# Patient Record
Sex: Female | Born: 1955 | Race: White | Hispanic: No | Marital: Married | Smoking: Never smoker
Health system: Southern US, Community
[De-identification: ages and names within clinical notes are randomized; demographics above are authoritative.]

## PROBLEM LIST (undated history)

## (undated) DIAGNOSIS — I82409 Acute embolism and thrombosis of unspecified deep veins of unspecified lower extremity: Secondary | ICD-10-CM

## (undated) DIAGNOSIS — Z227 Latent tuberculosis: Secondary | ICD-10-CM

## (undated) DIAGNOSIS — E119 Type 2 diabetes mellitus without complications: Secondary | ICD-10-CM

## (undated) DIAGNOSIS — E109 Type 1 diabetes mellitus without complications: Secondary | ICD-10-CM

## (undated) DIAGNOSIS — I10 Essential (primary) hypertension: Secondary | ICD-10-CM

## (undated) DIAGNOSIS — I509 Heart failure, unspecified: Secondary | ICD-10-CM

## (undated) DIAGNOSIS — I739 Peripheral vascular disease, unspecified: Secondary | ICD-10-CM

## (undated) DIAGNOSIS — I2510 Atherosclerotic heart disease of native coronary artery without angina pectoris: Secondary | ICD-10-CM

## (undated) DIAGNOSIS — R519 Headache, unspecified: Secondary | ICD-10-CM

## (undated) DIAGNOSIS — F32A Depression, unspecified: Secondary | ICD-10-CM

## (undated) DIAGNOSIS — M069 Rheumatoid arthritis, unspecified: Secondary | ICD-10-CM

## (undated) DIAGNOSIS — E785 Hyperlipidemia, unspecified: Secondary | ICD-10-CM

## (undated) DIAGNOSIS — Z9289 Personal history of other medical treatment: Secondary | ICD-10-CM

## (undated) DIAGNOSIS — F419 Anxiety disorder, unspecified: Secondary | ICD-10-CM

## (undated) HISTORY — PX: EYE SURGERY: SHX253

## (undated) HISTORY — PX: FOOT SURGERY: SHX648

## (undated) HISTORY — PX: TRIGGER FINGER RELEASE: SHX641

## (undated) HISTORY — PX: CARPAL TUNNEL RELEASE: SHX101

## (undated) HISTORY — PX: JOINT REPLACEMENT: SHX530

## (undated) NOTE — *Deleted (*Deleted)
Physical Medicine and Rehabilitation Admission H&P     HPI: Kaitlin Hudson is a 34 year old right-handed female history of RA with chronic prednisone, diastolic congestive heart failure, CAD followed at [facility] status post cardiac cath August 2021 underwent rotational atherectomy assisted multivessel PCI maintained on Plavix and Eliquis x12 months, CKD stage II with creatinine 1.37, hypertension, hyperlipidemia, diabetes mellitus with insulin pump and peripheral neuropathy.  Per chart review she lives with spouse.  1 level home with ramped entrance.  Modified independent with rolling walker prior to admission.  Presented 12/19/2019 with bilateral ischemic ulcers and lower extremity rest pain.  She is status post debridement and skin graft for right Achilles ulcer 4 months out from surgery.  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 Lajoyce Corners.  Wound vacs as directed.  Acute blood loss anemia 8.8.  Therapy evaluations completed and patient was admitted for a comprehensive rehab program.  Review of Systems  Constitutional: Negative for chills and fever.  HENT: Negative for hearing loss.   Eyes: Negative for blurred vision and double vision.  Respiratory: Negative for cough and shortness of breath.   Cardiovascular: Positive for palpitations and leg swelling.  Gastrointestinal: Positive for constipation. Negative for heartburn, nausea and vomiting.  Genitourinary: Negative for dysuria, flank pain and hematuria.  Musculoskeletal: Positive for joint pain and myalgias.  Neurological: Positive for headaches.  All other systems reviewed and are negative.  Past Medical History:  Diagnosis Date  . Arthritis, rheumatoid (HCC)   . CHF (congestive heart failure) (HCC)    pt denies this dx  . Coronary artery disease   . Diabetes mellitus without  complication (HCC)   . Headache   . Hyperlipidemia   . Hypertension   . Latent tuberculosis    pt denies this dx  . Peripheral vascular disease (HCC)    blood clot in left leg  . Type 1 diabetes [facility])    Past Surgical History:  Procedure Laterality Date  . AMPUTATION Bilateral 12/19/2019   Procedure: BILATERAL BELOW KNEE AMPUTATION;  Surgeon: Nadara Mustard, MD;  Location: [facility] OR;  Service: Orthopedics;  Laterality: Bilateral;  . CARDIAC CATHETERIZATION  11/02/2019  . CARPAL TUNNEL RELEASE Bilateral   . EYE SURGERY Bilateral    09/2018  . FINGER SURGERY  07/11/2014   right middle finger - nodule removed  . FINGER SURGERY  02/20/2017   right middle finger  . FOOT SURGERY Right 02/26/2014   screw  . I & D EXTREMITY Right 08/15/2019   Procedure: RIGHT ACHILLES DEBRIDEMENT, POSSIBLE SKIN GRAFT;  Surgeon: Nadara Mustard, MD;  Location: [facility] OR;  Service: Orthopedics;  Laterality: Right;  . JOINT REPLACEMENT    . SHOULDER SURGERY Right 01/06/2005  . TRIGGER FINGER RELEASE  12/202002   left hand -   . WRIST SURGERY Left 10/23/2004   and middle finger  . WRIST SURGERY Right 11/05/2005  . WRIST SURGERY Right 03/11/2006   cyst removed   History reviewed. No pertinent family history. Social History:  reports that she has never smoked. She has never used smokeless tobacco. She reports that she does not drink alcohol and does not use drugs. Allergies:  Allergies  Allergen Reactions  . Aspirin Other (See Comments)    Nose bleed  . Latex Rash  . Other Rash    VINYL   . Cimzia [Certolizumab Pegol] Other (See Comments)  bleeding  . Clindamycin/Lincomycin Diarrhea  . Diclofenac Nausea Only  . Enbrel [Etanercept] Hives  . Erythromycin Other (See Comments)    Severe stomach cramping  . Lasix [Furosemide] Itching  . Leflunomide Other (See Comments)    Fatigue/anxiety  . Rynatan [Chlorpheniramine-Phenyleph Er] Other (See Comments)    Stomach ache   . Statins Other (See Comments)     Myalgias.  . Tramadol Nausea And Vomiting  . Chlorhexidine Rash    CHG wipes  . Keflet [Cephalexin] Rash    Severe stomach cramping  . Penicillins Rash  . Pneumococcal Polysaccharide Vaccine Rash    All over body  . Sulfa Antibiotics Rash   Medications Prior to Admission  Medication Sig Dispense Refill  . acetaminophen (TYLENOL) 500 MG tablet Take 2 tablets (1,000 mg total) by mouth every 8 (eight) hours as needed for moderate pain. 30 tablet 0  . apixaban (ELIQUIS) 5 MG TABS tablet Take 5 mg by mouth 2 (two) times daily.    . bumetanide (BUMEX) 2 MG tablet Take 2 mg by mouth daily.    . cholecalciferol (VITAMIN D) 25 MCG (1000 UNIT) tablet Take 1,000 Units by mouth daily.    . clopidogrel (PLAVIX) 75 MG tablet Take 75 mg by mouth daily.    Marland Kitchen HUMALOG 100 UNIT/ML injection as directed. Via Insulin Pump    . metoprolol tartrate (LOPRESSOR) 25 MG tablet Take 12.5 mg by mouth 2 (two) times daily.    Marland Kitchen oxyCODONE (OXY IR/ROXICODONE) 5 MG immediate release tablet Take 1-2 tablets (5-10 mg total) by mouth every 4 (four) hours as needed for moderate pain (pain score 4-6). (Patient taking differently: Take 5-10 mg by mouth at bedtime. ) 30 tablet 0  . polyethylene glycol (MIRALAX / GLYCOLAX) 17 g packet Take 17 g by mouth daily as needed for moderate constipation.    . predniSONE (DELTASONE) 5 MG tablet Take 5-10 mg by mouth See admin instructions. Take 10 mg by mouth in the morning & take 5 mg by mouth at night.    . bisacodyl (DULCOLAX) 10 MG suppository Place 1 suppository (10 mg total) rectally daily as needed for moderate constipation. (Patient not taking: Reported on 12/13/2019) 12 suppository 0  . docusate sodium (COLACE) 100 MG capsule Take 1 capsule (100 mg total) by mouth 2 (two) times daily. 10 capsule 0  . glucose blood (ONETOUCH ULTRA) test strip Use 6 times daily. E11.65 on insulin pump    . Insulin Human (INSULIN PUMP) SOLN Inject into the skin as directed. With humalog insulin    .  Insulin Syringe-Needle U-100 (INSULIN SYRINGE .3CC/31GX5/16") 31G X 5/16" 0.3 ML MISC Use 1 time daily      Drug Regimen Review Drug regimen was reviewed and remains appropriate with no significant issues identified  Home: Home Living Family/patient expects to be discharged to:: Private residence Living Arrangements: Spouse/significant other Available Help at Discharge: Family, Available 24 hours/day Type of Home: House Home Access: Ramped entrance Home Layout: One level Bathroom Shower/Tub: Health visitor: Standard Bathroom Accessibility: Yes Home Equipment: Environmental consultant - 2 wheels, Bedside commode, Wheelchair - manual, Wheelchair - power, Tub bench Additional Comments: spouse very supportive and has been assisting with medical care    Functional History: Prior Function Level of Independence: Needs assistance Gait / Transfers Assistance Needed: mod I with RW  ADL's / Homemaking Assistance Needed: Spouse assisted with ADLs   Functional Status:  Mobility: Bed Mobility Overal bed mobility: Needs Assistance Bed Mobility: Supine to  Sit Supine to sit: Supervision General bed mobility comments: Supine to long sit with use of bed rail x 5 without physical assist Transfers General transfer comment: Pt unable to tolerate this date       ADL: ADL Overall ADL's : Needs assistance/impaired Eating/Feeding: Independent Grooming: Wash/dry hands, Wash/dry face, Oral care, Brushing hair, Set up, Bed level Upper Body Bathing: Set up, Bed level Lower Body Bathing: Moderate assistance, Bed level Upper Body Dressing : Moderate assistance, Bed level Lower Body Dressing: Maximal assistance, Bed level Toilet Transfer: Total assistance Toileting- Clothing Manipulation and Hygiene: Total assistance, Bed level General ADL Comments: Pt currently limited by pain   Cognition: Cognition Overall Cognitive Status: Within Functional Limits for tasks assessed Orientation Level: Oriented  X4 Cognition Arousal/Alertness: Awake/alert Behavior During Therapy: Anxious Overall Cognitive Status: Within Functional Limits for tasks assessed  Physical Exam: Blood pressure 138/60, pulse 91, temperature 98.2 F (36.8 C), temperature source Oral, resp. rate 15, height 5\' 4"  (1.626 m), weight 83.9 kg, SpO2 100 %. Physical Exam Skin:    Comments: Bilateral BKA sites are dressed with wound VAC in place  Neurological:     Comments: Patient is alert in no acute distress.  Oriented x3 and follows commands.     Results for orders placed or performed during the hospital encounter of 12/19/19 (from the past 48 hour(s))  Glucose, capillary     Status: Abnormal   Collection Time: 12/19/19  4:18 PM  Result Value Ref Range   Glucose-Capillary 130 (H) 70 - 99 mg/dL    Comment: Glucose reference range applies only to samples taken after fasting for at least 8 hours.  Hemoglobin A1c     Status: Abnormal   Collection Time: 12/19/19  5:09 PM  Result Value Ref Range   Hgb A1c MFr Bld 6.3 (H) 4.8 - 5.6 %    Comment: (NOTE) Pre diabetes:          5.7%-6.4%  Diabetes:              >6.4%  Glycemic control for   <7.0% adults with diabetes    Mean Plasma Glucose 134.11 mg/dL    Comment: Performed at [facility], [address], [place], [place] [address]  Glucose, capillary     Status: Abnormal   Collection Time: 12/19/19  8:04 PM  Result Value Ref Range   Glucose-Capillary 125 (H) 70 - 99 mg/dL    Comment: Glucose reference range applies only to samples taken after fasting for at least 8 hours.  Basic metabolic panel     Status: Abnormal   Collection Time: 12/20/19 12:19 AM  Result Value Ref Range   Sodium 137 135 - 145 mmol/L   Potassium 5.2 (H) 3.5 - 5.1 mmol/L   Chloride 106 98 - 111 mmol/L   CO2 21 (L) 22 - 32 mmol/L   Glucose, Bld 182 (H) 70 - 99 mg/dL    Comment: Glucose reference range applies only to samples taken after fasting for at least 8 hours.   BUN 28 (H) 8 - 23  mg/dL   Creatinine, Ser 6.04 (H) 0.44 - 1.00 mg/dL   Calcium 8.3 (L) 8.9 - 10.3 mg/dL   GFR calc non Af Amer 45 (L) >60 mL/min   Anion gap 10 5 - 15    Comment: Performed at [facility], [address], [place], [place] [address]  CBC     Status: Abnormal   Collection Time: 12/20/19 12:19 AM  Result Value Ref Range   WBC 13.1 (H) 4.0 - 10.5 K/uL   RBC 2.54 (L) 3.87 - 5.11 MIL/uL   Hemoglobin 7.2 (L) 12.0 - 15.0 g/dL    Comment: REPEATED TO VERIFY POST SURGICAL SPECIMEN    HCT 23.3 (L) 36 - 46 %   MCV 91.7 80.0 - 100.0 fL   MCH 28.3 26.0 - 34.0 pg   MCHC 30.9 30.0 - 36.0 g/dL   RDW 40.9 81.1 - 91.4 %   Platelets 303 150 - 400 K/uL   nRBC 0.0 0.0 - 0.2 %    Comment: Performed at [facility], [address], [place], [place] [address]  Glucose, capillary     Status: Abnormal   Collection Time: 12/20/19 12:50 AM  Result Value Ref Range   Glucose-Capillary 165 (H) 70 - 99 mg/dL    Comment: Glucose reference range applies only to samples taken after fasting for at least 8 hours.  Glucose, capillary     Status: Abnormal   Collection Time: 12/20/19  4:00 AM  Result Value Ref Range   Glucose-Capillary 162 (H) 70 - 99 mg/dL    Comment: Glucose reference range applies only to samples taken after fasting for at least 8 hours.  Glucose, capillary     Status: Abnormal   Collection Time: 12/20/19  7:50 AM  Result Value Ref Range   Glucose-Capillary 176 (H) 70 - 99 mg/dL    Comment: Glucose reference range applies only to samples taken after fasting for at least 8 hours.  ABO/Rh     Status: None   Collection Time: 12/20/19  8:19 AM  Result Value Ref Range   ABO/RH(D)      O POS Performed at [facility], [address], [place], [place] [address]   Glucose, capillary     Status: Abnormal   Collection Time: 12/20/19 11:39 AM  Result Value Ref Range   Glucose-Capillary 239 (H) 70 - 99 mg/dL    Comment: Glucose reference range applies only to samples taken after  fasting for at least 8 hours.  Type and screen [facility]     Status: None   Collection Time: 12/20/19 11:57 AM  Result Value Ref Range   ABO/RH(D) O POS    Antibody Screen NEG    Sample Expiration 12/23/2019,2359    Unit Number [id number]    Blood Component Type RED CELLS,LR    Unit division 00    Status of Unit ISSUED,FINAL    Transfusion Status OK TO TRANSFUSE    Crossmatch Result      Compatible Performed at [facility], [address], [place], [place] [address]   Prepare RBC (crossmatch)     Status: None   Collection Time: 12/20/19 11:57 AM  Result Value Ref Range   Order Confirmation      ORDER PROCESSED BY BLOOD BANK Performed at [facility], [address], [place], [place] [address]   Glucose, capillary     Status: Abnormal   Collection Time: 12/20/19  4:13 PM  Result Value Ref Range   Glucose-Capillary 253 (H) 70 - 99 mg/dL    Comment: Glucose reference range applies only to samples taken after fasting for at least 8 hours.  Glucose, capillary     Status: Abnormal   Collection Time: 12/20/19  8:23 PM  Result Value Ref Range   Glucose-Capillary 229 (H) 70 - 99 mg/dL    Comment: Glucose reference  range applies only to samples taken after fasting for at least 8 hours.  Hemoglobin and hematocrit, blood     Status: Abnormal   Collection Time: 12/20/19  9:13 PM  Result Value Ref Range   Hemoglobin 8.5 (L) 12.0 - 15.0 g/dL   HCT 38.7 (L) 36 - 46 %    Comment: Performed at [facility], [address], [place], [place] [address]  Glucose, capillary     Status: Abnormal   Collection Time: 12/21/19 12:15 AM  Result Value Ref Range   Glucose-Capillary 161 (H) 70 - 99 mg/dL    Comment: Glucose reference range applies only to samples taken after fasting for at least 8 hours.  CBC     Status: Abnormal   Collection Time: 12/21/19  2:36 AM  Result Value Ref Range   WBC 10.5 4.0 - 10.5 K/uL   RBC 3.10 (L) 3.87 - 5.11 MIL/uL    Hemoglobin 8.8 (L) 12.0 - 15.0 g/dL   HCT 29.5 (L) 36 - 46 %   MCV 89.0 80.0 - 100.0 fL   MCH 28.4 26.0 - 34.0 pg   MCHC 31.9 30.0 - 36.0 g/dL   RDW 18.8 41.6 - 60.6 %   Platelets 263 150 - 400 K/uL   nRBC 0.0 0.0 - 0.2 %    Comment: Performed at [facility], [address], [place], [place] [address]  Basic metabolic panel     Status: Abnormal   Collection Time: 12/21/19  2:36 AM  Result Value Ref Range   Sodium 138 135 - 145 mmol/L   Potassium 4.3 3.5 - 5.1 mmol/L   Chloride 105 98 - 111 mmol/L   CO2 24 22 - 32 mmol/L   Glucose, Bld 126 (H) 70 - 99 mg/dL    Comment: Glucose reference range applies only to samples taken after fasting for at least 8 hours.   BUN 21 8 - 23 mg/dL   Creatinine, Ser 1.09 (H) 0.44 - 1.00 mg/dL   Calcium 8.4 (L) 8.9 - 10.3 mg/dL   GFR calc non Af Amer 53 (L) >60 mL/min   Anion gap 9 5 - 15    Comment: Performed at [facility], [address] [place], [place] [address]  Glucose, capillary     Status: None   Collection Time: 12/21/19  4:14 AM  Result Value Ref Range   Glucose-Capillary 91 70 - 99 mg/dL    Comment: Glucose reference range applies only to samples taken after fasting for at least 8 hours.  Glucose, capillary     Status: Abnormal   Collection Time: 12/21/19  7:50 AM  Result Value Ref Range   Glucose-Capillary 126 (H) 70 - 99 mg/dL    Comment: Glucose reference range applies only to samples taken after fasting for at least 8 hours.  Glucose, capillary     Status: Abnormal   Collection Time: 12/21/19 12:35 PM  Result Value Ref Range   Glucose-Capillary 127 (H) 70 - 99 mg/dL    Comment: Glucose reference range applies only to samples taken after fasting for at least 8 hours.   No results found.     Medical Problem List and Plan: 1.  Decreased functional mobility secondary to progressive bilateral lower extremity gangrenous changes with recent debridement and skin grafts.  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  Wound VAC  as directed  -patient may *** shower  -ELOS/Goals: *** 2.  Antithrombotics: -DVT/anticoagulation: Eliquis  -antiplatelet therapy: Plavix  3. Pain Management: Neurontin 100 mg 3 times daily, oxycodone as needed 4. Mood: Provide emotional support  -antipsychotic agents: N/A 5. Neuropsych: This patient is capable of making decisions on her own behalf. 6. Skin/Wound Care: Routine skin checks 7. Fluids/Electrolytes/Nutrition: Routine in and outs with follow-up chemistries 8.  Acute blood loss anemia.  Follow-up CBC 9.  Rheumatoid arthritis.  Chronic prednisone 10.  CAD status post cardiac catheterization August 2021 underwent rotational atherectomy assisted multivessel PCI.  Continue Plavix and Eliquis x12 months 11.  CKD stage II.  Baseline creatinine 1.37 12.  Diastolic congestive heart failure.  Monitor for any signs of fluid overload.  Continue Bumex 2 mg daily 13.  Hypertension.  Lopressor 12.5 mg twice daily.  Monitor with increased mobility 14.  Diabetes mellitus with peripheral neuropathy.  Hemoglobin A1c 6.3.  Patient with insulin pump prior to admission.  Follow-up diabetic coordinator ***  Charlton Amor, PA-C 12/21/2019

---

## 2000-05-17 ENCOUNTER — Encounter: Admission: RE | Admit: 2000-05-17 | Discharge: 2000-08-15 | Payer: Self-pay | Admitting: Internal Medicine

## 2004-10-23 HISTORY — PX: WRIST SURGERY: SHX841

## 2004-11-30 ENCOUNTER — Encounter: Admission: RE | Admit: 2004-11-30 | Discharge: 2004-11-30 | Payer: Self-pay | Admitting: Rheumatology

## 2005-01-06 HISTORY — PX: SHOULDER SURGERY: SHX246

## 2005-11-05 HISTORY — PX: WRIST SURGERY: SHX841

## 2006-03-11 HISTORY — PX: WRIST SURGERY: SHX841

## 2010-05-28 ENCOUNTER — Ambulatory Visit (HOSPITAL_COMMUNITY)
Admission: RE | Admit: 2010-05-28 | Discharge: 2010-05-28 | Disposition: A | Discharge status: Home / Self Care | Payer: Managed Care, Other (non HMO) | Source: Ambulatory Visit | Attending: Orthopedic Surgery | Admitting: Orthopedic Surgery

## 2010-05-28 ENCOUNTER — Other Ambulatory Visit: Payer: Self-pay | Admitting: Orthopedic Surgery

## 2010-05-28 DIAGNOSIS — M79609 Pain in unspecified limb: Secondary | ICD-10-CM

## 2010-05-28 DIAGNOSIS — E119 Type 2 diabetes mellitus without complications: Secondary | ICD-10-CM | POA: Insufficient documentation

## 2010-05-28 DIAGNOSIS — I10 Essential (primary) hypertension: Secondary | ICD-10-CM | POA: Insufficient documentation

## 2010-06-21 ENCOUNTER — Emergency Department (HOSPITAL_BASED_OUTPATIENT_CLINIC_OR_DEPARTMENT_OTHER)
Admission: EM | Admit: 2010-06-21 | Discharge: 2010-06-21 | Disposition: A | Discharge status: Home / Self Care | Payer: Managed Care, Other (non HMO) | Attending: Emergency Medicine | Admitting: Emergency Medicine

## 2010-06-21 DIAGNOSIS — I10 Essential (primary) hypertension: Secondary | ICD-10-CM | POA: Insufficient documentation

## 2010-06-21 DIAGNOSIS — H9209 Otalgia, unspecified ear: Secondary | ICD-10-CM | POA: Insufficient documentation

## 2010-06-21 DIAGNOSIS — Z8739 Personal history of other diseases of the musculoskeletal system and connective tissue: Secondary | ICD-10-CM | POA: Insufficient documentation

## 2010-06-21 DIAGNOSIS — M26609 Unspecified temporomandibular joint disorder, unspecified side: Secondary | ICD-10-CM | POA: Insufficient documentation

## 2010-06-21 DIAGNOSIS — Z79899 Other long term (current) drug therapy: Secondary | ICD-10-CM | POA: Insufficient documentation

## 2010-06-21 DIAGNOSIS — E119 Type 2 diabetes mellitus without complications: Secondary | ICD-10-CM | POA: Insufficient documentation

## 2011-09-02 ENCOUNTER — Other Ambulatory Visit: Payer: Self-pay | Admitting: Orthopedic Surgery

## 2011-09-02 DIAGNOSIS — M25519 Pain in unspecified shoulder: Secondary | ICD-10-CM

## 2011-09-10 ENCOUNTER — Ambulatory Visit
Admission: RE | Admit: 2011-09-10 | Discharge: 2011-09-10 | Disposition: A | Discharge status: Home / Self Care | Payer: Managed Care, Other (non HMO) | Source: Ambulatory Visit | Attending: Orthopedic Surgery | Admitting: Orthopedic Surgery

## 2011-09-10 DIAGNOSIS — M25519 Pain in unspecified shoulder: Secondary | ICD-10-CM

## 2013-09-08 ENCOUNTER — Encounter (HOSPITAL_BASED_OUTPATIENT_CLINIC_OR_DEPARTMENT_OTHER): Payer: Self-pay | Admitting: Emergency Medicine

## 2013-09-08 ENCOUNTER — Inpatient Hospital Stay (HOSPITAL_BASED_OUTPATIENT_CLINIC_OR_DEPARTMENT_OTHER)
Admission: EM | Admit: 2013-09-08 | Discharge: 2013-09-12 | DRG: 603 | Disposition: A | Discharge status: Home / Self Care | Payer: Managed Care, Other (non HMO) | Attending: Internal Medicine | Admitting: Internal Medicine

## 2013-09-08 ENCOUNTER — Emergency Department (HOSPITAL_BASED_OUTPATIENT_CLINIC_OR_DEPARTMENT_OTHER): Payer: Managed Care, Other (non HMO)

## 2013-09-08 DIAGNOSIS — L03119 Cellulitis of unspecified part of limb: Principal | ICD-10-CM

## 2013-09-08 DIAGNOSIS — R509 Fever, unspecified: Secondary | ICD-10-CM

## 2013-09-08 DIAGNOSIS — Z9641 Presence of insulin pump (external) (internal): Secondary | ICD-10-CM

## 2013-09-08 DIAGNOSIS — E119 Type 2 diabetes mellitus without complications: Secondary | ICD-10-CM | POA: Diagnosis present

## 2013-09-08 DIAGNOSIS — I10 Essential (primary) hypertension: Secondary | ICD-10-CM | POA: Diagnosis present

## 2013-09-08 DIAGNOSIS — I509 Heart failure, unspecified: Secondary | ICD-10-CM | POA: Diagnosis present

## 2013-09-08 DIAGNOSIS — Z7982 Long term (current) use of aspirin: Secondary | ICD-10-CM

## 2013-09-08 DIAGNOSIS — E785 Hyperlipidemia, unspecified: Secondary | ICD-10-CM | POA: Diagnosis present

## 2013-09-08 DIAGNOSIS — M069 Rheumatoid arthritis, unspecified: Secondary | ICD-10-CM | POA: Diagnosis present

## 2013-09-08 DIAGNOSIS — IMO0001 Reserved for inherently not codable concepts without codable children: Secondary | ICD-10-CM

## 2013-09-08 DIAGNOSIS — L03115 Cellulitis of right lower limb: Secondary | ICD-10-CM | POA: Diagnosis present

## 2013-09-08 DIAGNOSIS — Z794 Long term (current) use of insulin: Secondary | ICD-10-CM

## 2013-09-08 DIAGNOSIS — L02419 Cutaneous abscess of limb, unspecified: Principal | ICD-10-CM | POA: Diagnosis present

## 2013-09-08 DIAGNOSIS — Z79899 Other long term (current) drug therapy: Secondary | ICD-10-CM

## 2013-09-08 DIAGNOSIS — I5032 Chronic diastolic (congestive) heart failure: Secondary | ICD-10-CM | POA: Diagnosis present

## 2013-09-08 HISTORY — DX: Type 2 diabetes mellitus without complications: E11.9

## 2013-09-08 HISTORY — DX: Rheumatoid arthritis, unspecified: M06.9

## 2013-09-08 HISTORY — DX: Latent tuberculosis: Z22.7

## 2013-09-08 HISTORY — DX: Essential (primary) hypertension: I10

## 2013-09-08 HISTORY — DX: Heart failure, unspecified: I50.9

## 2013-09-08 HISTORY — DX: Hyperlipidemia, unspecified: E78.5

## 2013-09-08 LAB — CBC WITH DIFFERENTIAL/PLATELET
BASOS ABS: 0 10*3/uL (ref 0.0–0.1)
BASOS PCT: 0 % (ref 0–1)
EOS ABS: 0.1 10*3/uL (ref 0.0–0.7)
EOS PCT: 1 % (ref 0–5)
HEMATOCRIT: 34.7 % — AB (ref 36.0–46.0)
HEMOGLOBIN: 11.5 g/dL — AB (ref 12.0–15.0)
LYMPHS ABS: 2.2 10*3/uL (ref 0.7–4.0)
LYMPHS PCT: 17 % (ref 12–46)
MCH: 28.8 pg (ref 26.0–34.0)
MCHC: 33.1 g/dL (ref 30.0–36.0)
MCV: 87 fL (ref 78.0–100.0)
MONO ABS: 1.6 10*3/uL — AB (ref 0.1–1.0)
MONOS PCT: 12 % (ref 3–12)
NEUTROS ABS: 9.3 10*3/uL — AB (ref 1.7–7.7)
NEUTROS PCT: 70 % (ref 43–77)
Platelets: 247 10*3/uL (ref 150–400)
RBC: 3.99 MIL/uL (ref 3.87–5.11)
RDW: 14 % (ref 11.5–15.5)
WBC: 13.2 10*3/uL — ABNORMAL HIGH (ref 4.0–10.5)

## 2013-09-08 LAB — BASIC METABOLIC PANEL
BUN: 25 mg/dL — ABNORMAL HIGH (ref 6–23)
CALCIUM: 9.5 mg/dL (ref 8.4–10.5)
CO2: 25 mEq/L (ref 19–32)
Chloride: 102 mEq/L (ref 96–112)
Creatinine, Ser: 1.1 mg/dL (ref 0.50–1.10)
GFR calc Af Amer: 62 mL/min — ABNORMAL LOW (ref >90)
GFR calc non Af Amer: 54 mL/min — ABNORMAL LOW (ref >90)
Glucose, Bld: 88 mg/dL (ref 70–99)
Potassium: 4.4 mEq/L (ref 3.7–5.3)
Sodium: 140 mEq/L (ref 137–147)

## 2013-09-08 MED ORDER — ACETAMINOPHEN 325 MG PO TABS
650.0000 mg | ORAL_TABLET | Freq: Once | ORAL | Status: AC
  Administered 2013-09-08: 650 mg via ORAL
  Filled 2013-09-08: qty 2

## 2013-09-08 MED ORDER — ONDANSETRON HCL 4 MG/2ML IJ SOLN
4.0000 mg | Freq: Once | INTRAMUSCULAR | Status: AC
  Administered 2013-09-08: 4 mg via INTRAVENOUS
  Filled 2013-09-08: qty 2

## 2013-09-08 MED ORDER — HYDROMORPHONE HCL PF 1 MG/ML IJ SOLN
0.5000 mg | Freq: Once | INTRAMUSCULAR | Status: AC
  Administered 2013-09-08: 0.5 mg via INTRAVENOUS
  Filled 2013-09-08: qty 1

## 2013-09-08 MED ORDER — CLINDAMYCIN PHOSPHATE 600 MG/50ML IV SOLN
600.0000 mg | Freq: Once | INTRAVENOUS | Status: AC
  Administered 2013-09-08: 600 mg via INTRAVENOUS
  Filled 2013-09-08: qty 50

## 2013-09-08 MED ORDER — SODIUM CHLORIDE 0.9 % IV SOLN
Freq: Once | INTRAVENOUS | Status: AC
  Administered 2013-09-08: 21:00:00 via INTRAVENOUS

## 2013-09-08 NOTE — ED Notes (Signed)
Alert, NAD, calm, interactive, resps e/u, speaking in clear complete sentences, VSS, BP low, HR 109.

## 2013-09-08 NOTE — ED Notes (Signed)
Attempted report to 5N 08.

## 2013-09-08 NOTE — ED Notes (Signed)
Pt c/o RT ankle and foot pain; linear shaped redness and scabbing noted to RT mid lower leg; pt does not recall injury

## 2013-09-08 NOTE — ED Provider Notes (Signed)
CSN: [id number]     Arrival date & time 09/08/13  1858 History   First MD Initiated Contact with Patient 09/08/13 1934     Chief Complaint  Patient presents with  . Leg Pain     (Consider location/radiation/quality/duration/timing/severity/associated sxs/prior Treatment) Patient is a 58 y.o. female presenting with leg pain. The history is provided by the patient and the spouse. No language interpreter was used.  Leg Pain Location:  Leg Leg location:  R leg Associated symptoms: no fever   Associated symptoms comment:  Right lower extremity swelling and redness since this morning. She has a scabbed area to lateral midshaft leg but is unaware of injury. She reports increasing pain in the left and being unable to bear weight today. No known fever. No history of blood clots.    Past Medical History  Diagnosis Date  . Diabetes mellitus without complication   . Hypertension   . CHF (congestive heart failure)   . Arthritis, rheumatoid   . Hyperlipidemia   . Latent tuberculosis    History reviewed. No pertinent past surgical history. History reviewed. No pertinent family history. History  Substance Use Topics  . Smoking status: Never Smoker   . Smokeless tobacco: Not on file  . Alcohol Use: No   OB History   Grav Para Term Preterm Abortions TAB SAB Ect Mult Living                 Review of Systems  Constitutional: Negative for fever.  Respiratory: Negative for shortness of breath.   Cardiovascular: Negative for chest pain.  Gastrointestinal: Positive for nausea. Negative for vomiting.  Genitourinary: Negative for dysuria.  Musculoskeletal: Positive for myalgias.  Skin: Positive for color change and wound.  Neurological: Negative for weakness.      Allergies  Cimzia; Diclofenac; Enbrel; Erythromycin; Keflet; Rynatan; Sulfa antibiotics; and Tramadol  Home Medications   Prior to Admission medications   Medication Sig Start Date End Date Taking? Authorizing Provider   aspirin EC 81 MG tablet Take 81 mg by mouth daily.   Yes Historical Provider, MD  Insulin Aspart (NOVOLOG Irion) Inject into the skin. PUMP   Yes Historical Provider, MD  leflunomide (ARAVA) 20 MG tablet Take 20 mg by mouth daily.   Yes Historical Provider, MD  olmesartan (BENICAR) 20 MG tablet Take 20 mg by mouth daily.   Yes Historical Provider, MD  pravastatin (PRAVACHOL) 20 MG tablet Take 20 mg by mouth daily.   Yes Historical Provider, MD  prednisoLONE 5 MG TABS tablet Take by mouth.   Yes Historical Provider, MD   BP 152/53  Pulse 116  Temp(Src) 98.2 F (36.8 C) (Oral)  Ht 5\' 4"  (1.626 m)  Wt 172 lb (78.019 kg)  BMI 29.51 kg/m2  SpO2 100% Physical Exam  Constitutional: She is oriented to person, place, and time. She appears well-developed and well-nourished. No distress.  HENT:  Head: Normocephalic.  Eyes: Conjunctivae are normal.  Neck: Normal range of motion.  Cardiovascular: Regular rhythm and intact distal pulses.  Tachycardia present.   No murmur heard. Pulmonary/Chest: Effort normal. She has no wheezes. She has no rales.  Abdominal: Soft. There is no tenderness.  Musculoskeletal: She exhibits tenderness.  Right leg red, warm to the touch. Redness extends from foot to lower right leg. There is a scabbed lesion lateral mid-shaft without drainage.   Neurological: She is alert and oriented to person, place, and time.  Skin: Skin is warm and dry.  Psychiatric: She has a  normal mood and affect.    ED Course  Procedures (including critical care time) Labs Review Labs Reviewed  CBC WITH DIFFERENTIAL - Abnormal; Notable for the following:    WBC 13.2 (*)    Hemoglobin 11.5 (*)    HCT 34.7 (*)    Neutro Abs 9.3 (*)    Monocytes Absolute 1.6 (*)    All other components within normal limits  BASIC METABOLIC PANEL - Abnormal; Notable for the following:    BUN 25 (*)    GFR calc non Af Amer 54 (*)    GFR calc Af Amer 62 (*)    All other components within normal limits   CULTURE, BLOOD (ROUTINE X 2)  CULTURE, BLOOD (ROUTINE X 2)    Imaging Review US Venous Img Lower Unilateral Right  09/08/2013   CLINICAL DATA:  Non painful cut to the [facility].  Leg pain.  EXAM: Right LOWER EXTREMITY VENOUS DOPPLER ULTRASOUND  TECHNIQUE: Gray-scale sonography with graded compression, as well as color Doppler and duplex ultrasound were performed to evaluate the lower extremity deep venous systems from the level of the common femoral vein and including the common femoral, femoral, profunda femoral, popliteal and calf veins including the posterior tibial, peroneal and gastrocnemius veins when visible. The superficial great saphenous vein was also interrogated. Spectral Doppler was utilized to evaluate flow at rest and with distal augmentation maneuvers in the common femoral, femoral and popliteal veins.  COMPARISON:  None.  FINDINGS: Common Femoral Vein: No evidence of thrombus. Normal compressibility, respiratory phasicity and response to augmentation.  Saphenofemoral Junction: No evidence of thrombus. Normal compressibility and flow on color Doppler imaging.  Profunda Femoral Vein: No evidence of thrombus. Normal compressibility and flow on color Doppler imaging.  Femoral Vein: No evidence of thrombus.  Popliteal Vein: No evidence of thrombus.  Calf Veins: No evidence of thrombus. Note that the peroneal vein was not visualized.  Superficial Great Saphenous Vein: No evidence of thrombus.  In the popliteal fossa there is an elongated hypoechoic fluid collection which is directed towards the posterior joint line, 1 x 1.4 x 0.6 cm.  IMPRESSION: 1. No evidence of deep venous thrombosis in the right lower extremity. 2. 1.4 cm fluid collection in the popliteal fossa, likely a Baker's cyst.   Electronically Signed   By: Tiburcio Pea M.D.   On: 09/08/2013 21:42     EKG Interpretation None      MDM   Final diagnoses:  None    1. LE cellulitis  Doppler negative of right LE. Mild  leukocytosis, tachycardia, significant pain in lower leg, with evidence of cellulitis in a patient with h/o diabetes. Will page for admission. Blood cultures pending, clindamycin running IV. Pain controlled.     Arnoldo Hooker, PA-C 09/08/13 2234

## 2013-09-08 NOTE — ED Notes (Signed)
Pt alert, NAD, calm, interactive, resps e/u, speaking in clear complete sentences, no dyspnea noted, (denies sob, nausea or dizziness), admits to some pain 4/10, fever noted, requesting snack, reports recent low CBGs at night. Husband at [facility]. Updated.

## 2013-09-09 DIAGNOSIS — L02419 Cutaneous abscess of limb, unspecified: Principal | ICD-10-CM

## 2013-09-09 DIAGNOSIS — L03119 Cellulitis of unspecified part of limb: Principal | ICD-10-CM

## 2013-09-09 DIAGNOSIS — E119 Type 2 diabetes mellitus without complications: Secondary | ICD-10-CM

## 2013-09-09 DIAGNOSIS — IMO0001 Reserved for inherently not codable concepts without codable children: Secondary | ICD-10-CM

## 2013-09-09 DIAGNOSIS — Z794 Long term (current) use of insulin: Secondary | ICD-10-CM

## 2013-09-09 LAB — GLUCOSE, CAPILLARY
GLUCOSE-CAPILLARY: 206 mg/dL — AB (ref 70–99)
Glucose-Capillary: 212 mg/dL — ABNORMAL HIGH (ref 70–99)
Glucose-Capillary: 188 mg/dL — ABNORMAL HIGH (ref 70–99)

## 2013-09-09 LAB — CBG MONITORING, ED: GLUCOSE-CAPILLARY: 129 mg/dL — AB (ref 70–99)

## 2013-09-09 MED ORDER — PREDNISOLONE 5 MG PO TABS
5.0000 mg | ORAL_TABLET | Freq: Every day | ORAL | Status: DC
  Filled 2013-09-09: qty 1

## 2013-09-09 MED ORDER — HYDROMORPHONE HCL PF 1 MG/ML IJ SOLN
0.5000 mg | Freq: Once | INTRAMUSCULAR | Status: AC
  Administered 2013-09-09: 0.5 mg via INTRAVENOUS
  Filled 2013-09-09: qty 1

## 2013-09-09 MED ORDER — SIMVASTATIN 10 MG PO TABS
10.0000 mg | ORAL_TABLET | Freq: Every day | ORAL | Status: DC
  Filled 2013-09-09 (×5): qty 1

## 2013-09-09 MED ORDER — ACETAMINOPHEN 325 MG PO TABS
650.0000 mg | ORAL_TABLET | Freq: Four times a day (QID) | ORAL | Status: DC | PRN
  Administered 2013-09-09: 650 mg via ORAL
  Filled 2013-09-09: qty 2

## 2013-09-09 MED ORDER — INSULIN PUMP
Freq: Three times a day (TID) | SUBCUTANEOUS | Status: DC
  Administered 2013-09-09: 4 via SUBCUTANEOUS
  Administered 2013-09-09: 10 via SUBCUTANEOUS
  Administered 2013-09-09: 12 via SUBCUTANEOUS
  Administered 2013-09-10 (×2): via SUBCUTANEOUS
  Administered 2013-09-11: 13 via SUBCUTANEOUS
  Administered 2013-09-11: 11 via SUBCUTANEOUS
  Administered 2013-09-11: 6 via SUBCUTANEOUS
  Administered 2013-09-12: 7 via SUBCUTANEOUS
  Administered 2013-09-12: 16 via SUBCUTANEOUS
  Administered 2013-09-12: 14 via SUBCUTANEOUS
  Filled 2013-09-09: qty 1

## 2013-09-09 MED ORDER — HYDROMORPHONE HCL PF 1 MG/ML IJ SOLN: 0.5000 mg | INTRAMUSCULAR | Status: DC | PRN

## 2013-09-09 MED ORDER — CLINDAMYCIN PHOSPHATE 600 MG/50ML IV SOLN
600.0000 mg | Freq: Three times a day (TID) | INTRAVENOUS | Status: DC
  Administered 2013-09-09 – 2013-09-10 (×4): 600 mg via INTRAVENOUS
  Filled 2013-09-09 (×6): qty 50

## 2013-09-09 MED ORDER — PREDNISOLONE 5 MG PO TABS
10.0000 mg | ORAL_TABLET | Freq: Every day | ORAL | Status: DC
  Administered 2013-09-10 – 2013-09-12 (×3): 10 mg via ORAL
  Filled 2013-09-09 (×4): qty 2

## 2013-09-09 MED ORDER — HEPARIN SODIUM (PORCINE) 5000 UNIT/ML IJ SOLN
5000.0000 [IU] | Freq: Three times a day (TID) | INTRAMUSCULAR | Status: DC
  Administered 2013-09-09 – 2013-09-12 (×10): 5000 [IU] via SUBCUTANEOUS
  Filled 2013-09-09 (×13): qty 1

## 2013-09-09 MED ORDER — ASPIRIN EC 81 MG PO TBEC
81.0000 mg | DELAYED_RELEASE_TABLET | Freq: Every day | ORAL | Status: DC
  Administered 2013-09-09 – 2013-09-12 (×4): 81 mg via ORAL
  Filled 2013-09-09 (×4): qty 1

## 2013-09-09 MED ORDER — HYDROMORPHONE HCL PF 1 MG/ML IJ SOLN
0.5000 mg | Freq: Once | INTRAMUSCULAR | Status: AC
  Administered 2013-09-09: 0.5 mg via INTRAVENOUS

## 2013-09-09 MED ORDER — OLMESARTAN MEDOXOMIL 40 MG PO TABS
40.0000 mg | ORAL_TABLET | Freq: Every day | ORAL | Status: DC
  Administered 2013-09-10 – 2013-09-11 (×2): 40 mg via ORAL
  Filled 2013-09-09 (×4): qty 1

## 2013-09-09 MED ORDER — HYDROMORPHONE HCL PF 1 MG/ML IJ SOLN
INTRAMUSCULAR | Status: AC
  Administered 2013-09-09: 0.5 mg
  Filled 2013-09-09: qty 1

## 2013-09-09 MED ORDER — HYDROCODONE-ACETAMINOPHEN 5-325 MG PO TABS: 1.0000 | ORAL_TABLET | ORAL | Status: DC | PRN

## 2013-09-09 MED ORDER — LEFLUNOMIDE 20 MG PO TABS
20.0000 mg | ORAL_TABLET | Freq: Every day | ORAL | Status: DC
Start: 2013-09-09 — End: 2013-09-12
  Filled 2013-09-09 (×4): qty 1

## 2013-09-09 MED ORDER — IRBESARTAN 150 MG PO TABS
150.0000 mg | ORAL_TABLET | Freq: Every day | ORAL | Status: DC
  Filled 2013-09-09: qty 1

## 2013-09-09 MED ORDER — HYDROMORPHONE HCL 2 MG PO TABS
2.0000 mg | ORAL_TABLET | ORAL | Status: DC | PRN
  Administered 2013-09-09: 4 mg via ORAL
  Administered 2013-09-09: 2 mg via ORAL
  Administered 2013-09-11: 4 mg via ORAL
  Filled 2013-09-09 (×3): qty 2

## 2013-09-09 NOTE — H&P (Signed)
[facility] History and Physical  Kaitlin Hudson UMP:[id number] DOB: 12/09/1955 DOA: 09/08/2013  Referring physician: EDP PCP: [name]   Chief Complaint: Leg pain   HPI: Kaitlin Hudson is a 58 y.o. female who presents to the ED with RLE pain.  RLE pain, swelling, and redness since this morning.  Unable to bear weight today.  Has scabbed area to lateral midshaft leg but unaware of injury.  No h/o blood clots.  Given clindamycin in ED and transferred to [facility] for admission.  Review of Systems: Systems reviewed.  As above, otherwise negative  Past Medical History  Diagnosis Date  . Diabetes mellitus without complication   . Hypertension   . CHF (congestive heart failure)   . Arthritis, rheumatoid   . Hyperlipidemia   . Latent tuberculosis    History reviewed. No pertinent past surgical history. Social History:  reports that she has never smoked. She does not have any smokeless tobacco history on file. She reports that she does not drink alcohol or use illicit drugs.  Allergies  Allergen Reactions  . Cimzia [Certolizumab Pegol]   . Diclofenac   . Enbrel [Etanercept]   . Erythromycin   . Keflet [Cephalexin]   . Rynatan [Chlorpheniramine-Phenyleph Er]   . Sulfa Antibiotics   . Tramadol     History reviewed. No pertinent family history.   Prior to Admission medications   Medication Sig Start Date End Date Taking? Authorizing Provider  aspirin EC 81 MG tablet Take 81 mg by mouth daily.   Yes Historical Provider, MD  Insulin Aspart (NOVOLOG [place]) Inject into the skin. PUMP   Yes Historical Provider, MD  leflunomide (ARAVA) 20 MG tablet Take 20 mg by mouth daily.   Yes Historical Provider, MD  olmesartan (BENICAR) 20 MG tablet Take 20 mg by mouth daily.   Yes Historical Provider, MD  pravastatin (PRAVACHOL) 20 MG tablet Take 20 mg by mouth daily.   Yes Historical Provider, MD  prednisoLONE 5 MG TABS tablet Take by mouth.   Yes Historical Provider, MD   Physical  Exam: Filed Vitals:   09/09/13 0209  BP: 120/39  Pulse: 97  Temp: 98.6 F (37 C)  Resp: 20    BP 120/39  Pulse 97  Temp(Src) 98.6 F (37 C) (Oral)  Resp 20  Ht 5\' 4"  (1.626 m)  Wt 78.019 kg (172 lb)  BMI 29.51 kg/m2  SpO2 94%  General Appearance:    Alert, oriented, no distress, appears stated age  Head:    Normocephalic, atraumatic  Eyes:    PERRL, EOMI, sclera non-icteric        Nose:   Nares without drainage or epistaxis. Mucosa, turbinates normal  Throat:   Moist mucous membranes. Oropharynx without erythema or exudate.  Neck:   Supple. No carotid bruits.  No thyromegaly.  No lymphadenopathy.   Back:     No CVA tenderness, no spinal tenderness  Lungs:     Clear to auscultation bilaterally, without wheezes, rhonchi or rales  Chest wall:    No tenderness to palpitation  Heart:    Regular rate and rhythm without murmurs, gallops, rubs  Abdomen:     Soft, non-tender, nondistended, normal bowel sounds, no organomegaly  Genitalia:    deferred  Rectal:    deferred  Extremities:   No clubbing, cyanosis or edema.  Pulses:   2+ and symmetric all extremities  Skin:   There is a scabbed area to lateral midshaft of the R leg, the leg  does seem slightly swollen compared to the left.  I do not appreciate much in the way of erythema at this time.  Lymph nodes:   Cervical, supraclavicular, and axillary nodes normal  Neurologic:   CNII-XII intact. Normal strength, sensation and reflexes      throughout    Labs on Admission:  Basic Metabolic Panel:  Recent Labs Lab 09/08/13 2030  NA 140  K 4.4  CL 102  CO2 25  GLUCOSE 88  BUN 25*  CREATININE 1.10  CALCIUM 9.5   Liver Function Tests: No results found for this basename: AST, ALT, ALKPHOS, BILITOT, PROT, ALBUMIN,  in the last 168 hours No results found for this basename: LIPASE, AMYLASE,  in the last 168 hours No results found for this basename: AMMONIA,  in the last 168 hours CBC:  Recent Labs Lab 09/08/13 2030  WBC  13.2*  NEUTROABS 9.3*  HGB 11.5*  HCT 34.7*  MCV 87.0  PLT 247   Cardiac Enzymes: No results found for this basename: CKTOTAL, CKMB, CKMBINDEX, TROPONINI,  in the last 168 hours  BNP (last 3 results) No results found for this basename: PROBNP,  in the last 8760 hours CBG:  Recent Labs Lab 09/09/13 0027 09/09/13 0210  GLUCAP 129* 206*    Radiological Exams on Admission: US Venous Img Lower Unilateral Right  09/08/2013   CLINICAL DATA:  Non painful cut to the [facility].  Leg pain.  EXAM: Right LOWER EXTREMITY VENOUS DOPPLER ULTRASOUND  TECHNIQUE: Gray-scale sonography with graded compression, as well as color Doppler and duplex ultrasound were performed to evaluate the lower extremity deep venous systems from the level of the common femoral vein and including the common femoral, femoral, profunda femoral, popliteal and calf veins including the posterior tibial, peroneal and gastrocnemius veins when visible. The superficial great saphenous vein was also interrogated. Spectral Doppler was utilized to evaluate flow at rest and with distal augmentation maneuvers in the common femoral, femoral and popliteal veins.  COMPARISON:  None.  FINDINGS: Common Femoral Vein: No evidence of thrombus. Normal compressibility, respiratory phasicity and response to augmentation.  Saphenofemoral Junction: No evidence of thrombus. Normal compressibility and flow on color Doppler imaging.  Profunda Femoral Vein: No evidence of thrombus. Normal compressibility and flow on color Doppler imaging.  Femoral Vein: No evidence of thrombus.  Popliteal Vein: No evidence of thrombus.  Calf Veins: No evidence of thrombus. Note that the peroneal vein was not visualized.  Superficial Great Saphenous Vein: No evidence of thrombus.  In the popliteal fossa there is an elongated hypoechoic fluid collection which is directed towards the posterior joint line, 1 x 1.4 x 0.6 cm.  IMPRESSION: 1. No evidence of deep venous thrombosis in the  right lower extremity. 2. 1.4 cm fluid collection in the popliteal fossa, likely a Baker's cyst.   Electronically Signed   By: Tiburcio Pea M.D.   On: 09/08/2013 21:42    EKG: Independently reviewed.  Assessment/Plan Principal Problem:   Cellulitis of right lower extremity Active Problems:   IDDM (insulin dependent diabetes mellitus)   1. Cellulitis of RLE - physical exam is quite unimpressive, did have fever of 101.7 in ED and blood cx are pending.  Continuing clindamycin at this time. 2. IDDM - will continue patients insulin pump per home settings, on insulin pump protocol.    Code Status: Full Code  Family Communication: No family in room Disposition Plan: Admit to inpatient   Time spent: 50 min  Clelia Trabucco M. [facility] Pager [phone]  If 7AM-7PM, please contact the day team taking care of the patient [email or web address] Password [email or web address] 09/09/2013, 2:29 AM

## 2013-09-09 NOTE — ED Notes (Signed)
Given snack: 2% milk, apple sauce, cheese bar.

## 2013-09-09 NOTE — Progress Notes (Signed)
[facility] PROGRESS NOTE  Kaitlin Hudson JSE:[id number] DOB: 03-Jan-1956 DOA: 09/08/2013 PCP: [name] I have seen and examined pt who is a 57yo admitted this am by Dr Julian Reil, with RLE pain and swelling, redness s/p injuring mid leg area laterally>>unaware of how it happened.Will continue current abx management plan as per Dr Julian Reil, and follow cultures. She has increased ankle tenderness on exam, will obtain MRI and follow. Husband at bedside.      Kela Millin  [facility] Pager [phone]. If 7PM-7AM, please contact night-coverage at [email or web address], password [facility] 09/09/2013, 1:54 PM  LOS: 1 day

## 2013-09-09 NOTE — ED Notes (Signed)
Alert, NAD, calm, interactive, tolerating snacks, pending [facility] arrival, no changes, VSS. Denies nausea.

## 2013-09-09 NOTE — ED Notes (Signed)
[facility] here for transport. No change.

## 2013-09-09 NOTE — ED Notes (Signed)
CBG 129 

## 2013-09-09 NOTE — ED Notes (Signed)
Pending arrival of [facility]

## 2013-09-09 NOTE — ED Provider Notes (Signed)
Medical screening examination/treatment/procedure(s) were performed by non-physician practitioner and as supervising physician I was immediately available for consultation/collaboration.   EKG Interpretation None        Rolan Bucco, MD 09/09/13 0104

## 2013-09-10 ENCOUNTER — Inpatient Hospital Stay (HOSPITAL_COMMUNITY): Payer: Managed Care, Other (non HMO)

## 2013-09-10 LAB — GLUCOSE, CAPILLARY
GLUCOSE-CAPILLARY: 158 mg/dL — AB (ref 70–99)
GLUCOSE-CAPILLARY: 64 mg/dL — AB (ref 70–99)
GLUCOSE-CAPILLARY: 65 mg/dL — AB (ref 70–99)
GLUCOSE-CAPILLARY: 89 mg/dL (ref 70–99)
Glucose-Capillary: 144 mg/dL — ABNORMAL HIGH (ref 70–99)
Glucose-Capillary: 164 mg/dL — ABNORMAL HIGH (ref 70–99)
Glucose-Capillary: 142 mg/dL — ABNORMAL HIGH (ref 70–99)
Glucose-Capillary: 187 mg/dL — ABNORMAL HIGH (ref 70–99)

## 2013-09-10 MED ORDER — GADOBENATE DIMEGLUMINE 529 MG/ML IV SOLN
17.0000 mL | Freq: Once | INTRAVENOUS | Status: AC | PRN
  Administered 2013-09-10: 17 mL via INTRAVENOUS

## 2013-09-10 MED ORDER — PIPERACILLIN-TAZOBACTAM 3.375 G IVPB
3.3750 g | Freq: Three times a day (TID) | INTRAVENOUS | Status: DC
  Administered 2013-09-10 – 2013-09-12 (×6): 3.375 g via INTRAVENOUS
  Filled 2013-09-10 (×10): qty 50

## 2013-09-10 MED ORDER — SODIUM CHLORIDE 0.9 % IV SOLN
1250.0000 mg | INTRAVENOUS | Status: DC
  Administered 2013-09-10 – 2013-09-12 (×3): 1250 mg via INTRAVENOUS
  Filled 2013-09-10 (×3): qty 1250

## 2013-09-10 NOTE — Progress Notes (Signed)
[facility] PROGRESS NOTE  Kaitlin Hudson GYF:[id number] DOB: 1955-12-06 DOA: 09/08/2013 PCP: [name]  Assessment/Plan: Right lower extremity cellulitis -Patient still with fevers overnight, blood cultures still pending -Will change antibiotics to vancomycin and Zosyn -Await MRI of right foot, follow and further manage accordingly pending studies -Continue pain management Fevers - possibly secondary to above, but recurring on clindamycin -Follow cultures, antibiotics changed to vancomycin and Zosyn as above -Await MRI Diabetes mellitus -Continue insulin pump Code Status: Full Family Communication: Husband at bedside Disposition Plan: To home when medically ready   Consultants:  None  Procedures:  None  Antibiotics:  Clindamycin 6/28> 6/28  Vancomycin and Zosyn started 6/29  HPI/Subjective: State pain better controlled, feverish overnight, and still with right foot swelling  Objective: Filed Vitals:   09/10/13 0900  BP: 131/45  Pulse: 103  Temp: 100.5 F (38.1 C)  Resp: 18    Intake/Output Summary (Last 24 hours) at 09/10/13 1133 Last data filed at 09/10/13 0608  Gross per 24 hour  Intake    240 ml  Output      0 ml  Net    240 ml   Filed Weights   09/08/13 1909  Weight: 78.019 kg (172 lb)    Exam:  General: alert & oriented x 3 In NAD Cardiovascular: RRR, nl S1 s2 Respiratory: CTAB Abdomen: soft +BS NT/ND, no masses palpable Extremities: Right lower extremity-+2 swelling laterally on the dorsum of foot and ankle area. Decreased erythema but still some persisting around the ankle laterally. Increased tenderness present. No cyanosis.    Data Reviewed: Basic Metabolic Panel:  Recent Labs Lab 09/08/13 2030  NA 140  K 4.4  CL 102  CO2 25  GLUCOSE 88  BUN 25*  CREATININE 1.10  CALCIUM 9.5   Liver Function Tests: No results found for this basename: AST, ALT, ALKPHOS, BILITOT, PROT, ALBUMIN,  in the last 168 hours No results  found for this basename: LIPASE, AMYLASE,  in the last 168 hours No results found for this basename: AMMONIA,  in the last 168 hours CBC:  Recent Labs Lab 09/08/13 2030  WBC 13.2*  NEUTROABS 9.3*  HGB 11.5*  HCT 34.7*  MCV 87.0  PLT 247   Cardiac Enzymes: No results found for this basename: CKTOTAL, CKMB, CKMBINDEX, TROPONINI,  in the last 168 hours BNP (last 3 results) No results found for this basename: PROBNP,  in the last 8760 hours CBG:  Recent Labs Lab 09/09/13 1706 09/09/13 2211 09/10/13 0235 09/10/13 0640 09/10/13 1113  GLUCAP 144* 164* 142* 158* 187*    Recent Results (from the past 240 hour(s))  CULTURE, BLOOD (ROUTINE X 2)     Status: None   Collection Time    09/08/13 10:00 PM      Result Value Ref Range Status   Specimen Description BLOOD LEFT ANTECUBITAL   Final   Special Requests     Final   Value: BOTTLES DRAWN AEROBIC AND ANAEROBIC AER 1cc ANA 1 CC   Culture  Setup Time     Final   Value: 09/09/2013 02:21     Performed at [facility]   Culture     Final   Value:        BLOOD CULTURE RECEIVED NO GROWTH TO DATE CULTURE WILL BE HELD FOR 5 DAYS BEFORE ISSUING A FINAL NEGATIVE REPORT     Performed at [facility]   Report Status PENDING   Incomplete  CULTURE, BLOOD (ROUTINE X 2)  Status: None   Collection Time    09/08/13 10:01 PM      Result Value Ref Range Status   Specimen Description BLOOD LEFT HAND   Final   Special Requests     Final   Value: BOTTLES DRAWN AEROBIC AND ANAEROBIC AER 5cc ANA 5cc   Culture  Setup Time     Final   Value: 09/09/2013 02:21     Performed at [facility]   Culture     Final   Value:        BLOOD CULTURE RECEIVED NO GROWTH TO DATE CULTURE WILL BE HELD FOR 5 DAYS BEFORE ISSUING A FINAL NEGATIVE REPORT     Performed at [facility]   Report Status PENDING   Incomplete     Studies: US Venous Img Lower Unilateral Right  09/08/2013   CLINICAL DATA:  Non painful cut to the [facility].   Leg pain.  EXAM: Right LOWER EXTREMITY VENOUS DOPPLER ULTRASOUND  TECHNIQUE: Gray-scale sonography with graded compression, as well as color Doppler and duplex ultrasound were performed to evaluate the lower extremity deep venous systems from the level of the common femoral vein and including the common femoral, femoral, profunda femoral, popliteal and calf veins including the posterior tibial, peroneal and gastrocnemius veins when visible. The superficial great saphenous vein was also interrogated. Spectral Doppler was utilized to evaluate flow at rest and with distal augmentation maneuvers in the common femoral, femoral and popliteal veins.  COMPARISON:  None.  FINDINGS: Common Femoral Vein: No evidence of thrombus. Normal compressibility, respiratory phasicity and response to augmentation.  Saphenofemoral Junction: No evidence of thrombus. Normal compressibility and flow on color Doppler imaging.  Profunda Femoral Vein: No evidence of thrombus. Normal compressibility and flow on color Doppler imaging.  Femoral Vein: No evidence of thrombus.  Popliteal Vein: No evidence of thrombus.  Calf Veins: No evidence of thrombus. Note that the peroneal vein was not visualized.  Superficial Great Saphenous Vein: No evidence of thrombus.  In the popliteal fossa there is an elongated hypoechoic fluid collection which is directed towards the posterior joint line, 1 x 1.4 x 0.6 cm.  IMPRESSION: 1. No evidence of deep venous thrombosis in the right lower extremity. 2. 1.4 cm fluid collection in the popliteal fossa, likely a Baker's cyst.   Electronically Signed   By: Tiburcio Pea M.D.   On: 09/08/2013 21:42    Scheduled Meds: . aspirin EC  81 mg Oral Daily  . heparin  5,000 Units Subcutaneous 3 times per day  . insulin pump   Subcutaneous TID AC, HS, 0200  . leflunomide  20 mg Oral Daily  . olmesartan  40 mg Oral Daily  . piperacillin-tazobactam (ZOSYN)  IV  3.375 g Intravenous 3 times per day  . prednisoLONE  10 mg  Oral Daily  . simvastatin  10 mg Oral q1800  . vancomycin  1,250 mg Intravenous Q24H   Continuous Infusions:   Principal Problem:   Cellulitis of right lower extremity Active Problems:   IDDM (insulin dependent diabetes mellitus)    Time spent: 35    [facility] C  [facility] Pager [phone]. If 7PM-7AM, please contact night-coverage at [email or web address], password [facility] 09/10/2013, 11:33 AM  LOS: 2 days

## 2013-09-10 NOTE — Progress Notes (Signed)
ANTIBIOTIC CONSULT NOTE - INITIAL  Pharmacy Consult for Vanc/Zosyn Indication: cellulitis  Allergies  Allergen Reactions  . Cimzia [Certolizumab Pegol]     bleeding  . Clindamycin/Lincomycin Diarrhea  . Diclofenac Nausea Only  . Enbrel [Etanercept] Hives  . Erythromycin     Severe stomach cramping  . Rynatan [Chlorpheniramine-Phenyleph Er]     unsure  . Tramadol Nausea And Vomiting  . Keflet [Cephalexin] Rash    Severe stomach cramping  . Sulfa Antibiotics Rash    Patient Measurements: Height: 5\' 4"  (162.6 cm) Weight: 172 lb (78.019 kg) IBW/kg (Calculated) : 54.7  Vital Signs: Temp: 100.5 F (38.1 C) (06/29 0900) Temp src: Oral (06/29 0900) BP: 131/45 mmHg (06/29 0900) Pulse Rate: 103 (06/29 0900) Intake/Output from previous day: 06/28 0701 - 06/29 0700 In: 240 [P.O.:240] Out: -  Intake/Output from this shift:    Labs:  Recent Labs  09/08/13 2030  WBC 13.2*  HGB 11.5*  PLT 247  CREATININE 1.10   Estimated Creatinine Clearance: 55.6 ml/min (by C-G formula based on Cr of 1.1).   Microbiology: Recent Results (from the past 720 hour(s))  CULTURE, BLOOD (ROUTINE X 2)     Status: None   Collection Time    09/08/13 10:00 PM      Result Value Ref Range Status   Specimen Description BLOOD LEFT ANTECUBITAL   Final   Special Requests     Final   Value: BOTTLES DRAWN AEROBIC AND ANAEROBIC AER 1cc ANA 1 CC   Culture  Setup Time     Final   Value: 09/09/2013 02:21     Performed at 09/11/2013   Culture     Final   Value:        BLOOD CULTURE RECEIVED NO GROWTH TO DATE CULTURE WILL BE HELD FOR 5 DAYS BEFORE ISSUING A FINAL NEGATIVE REPORT     Performed at [facility]   Report Status PENDING   Incomplete  CULTURE, BLOOD (ROUTINE X 2)     Status: None   Collection Time    09/08/13 10:01 PM      Result Value Ref Range Status   Specimen Description BLOOD LEFT HAND   Final   Special Requests     Final   Value: BOTTLES DRAWN AEROBIC AND  ANAEROBIC AER 5cc ANA 5cc   Culture  Setup Time     Final   Value: 09/09/2013 02:21     Performed at 09/11/2013   Culture     Final   Value:        BLOOD CULTURE RECEIVED NO GROWTH TO DATE CULTURE WILL BE HELD FOR 5 DAYS BEFORE ISSUING A FINAL NEGATIVE REPORT     Performed at [facility]   Report Status PENDING   Incomplete    Medical History: Past Medical History  Diagnosis Date  . Diabetes mellitus without complication   . Hypertension   . CHF (congestive heart failure)   . Arthritis, rheumatoid   . Hyperlipidemia   . Latent tuberculosis     Medications:  Scheduled:  . aspirin EC  81 mg Oral Daily  . heparin  5,000 Units Subcutaneous 3 times per day  . insulin pump   Subcutaneous TID AC, HS, 0200  . leflunomide  20 mg Oral Daily  . olmesartan  40 mg Oral Daily  . prednisoLONE  10 mg Oral Daily  . simvastatin  10 mg Oral q1800   Assessment: 58 yo F presented  to ED 6/28 with RLE pain and swelling s/p RLE injury.  Pt is unable to bear weight.  Pt received Clindamycin in ED 6/28.  Renal function is normal with SCr 1.1 and estimated CrCl  ~ 12ml/min.  WBC slightly elevated at 13.2.  Tm 101.7.  Blood cx ordered.  Goal of Therapy:  Vancomycin trough level 10-15 mcg/ml Renal dose adjustment of antbiotics  Plan:  Vancomycin 1250 mg IV q24h. Zosyn 3.375 gm IV q8h (4 hour infusion). Follow up culture data and clinical progress. Check Vancomycin level as indicated.  [employer], Pharm.D., BCPS Clinical Pharmacist Pager [phone] 09/10/2013 10:49 AM

## 2013-09-10 NOTE — Progress Notes (Signed)
Patient CBG= 74.

## 2013-09-10 NOTE — Progress Notes (Signed)
Inpatient Diabetes Program Recommendations  AACE/ADA: New Consensus Statement on Inpatient Glycemic Control (2013)  Target Ranges:  Prepandial:   less than 140 mg/dL      Peak postprandial:   less than 180 mg/dL (1-2 hours)      Critically ill patients:  140 - 180 mg/dL   Reason for Visit: Insulin pump  Diabetes history: Type 1 diabetes since Age 58-See's Dr. Allena Katz Outpatient Diabetes medications: [employer] insulin pump Current orders for Inpatient glycemic control:  Patient changed site yesterday (09/09/13) in the AM.   Basal total:  33.4 units/day 12a-0.9 units/hr 2:30a-0.9 units/hr 8a-1.8 units/hr 2p-2.1 units/hr 6:30p-1.2 units/hr 9p-1.0 units/hr Patient states that she boluses based on "Calculation in her head" which varies based on her dose of prednisone.  She states her recent A1C was 8.0% which is likely due to the steroids for her RA.  She is alert and oriented and very capable of managing her pump.  She has flowsheet and has signed consent.  Discussed plan to disconnect from pump during MRI.  She verbalized understanding.  Will be glad to assist as needed.  Beryl Meager, RN, BC-ADM Inpatient Diabetes Coordinator Pager [phone]

## 2013-09-10 NOTE — Care Management Note (Signed)
Utilization review completed. Susan  Brady, RN BSN Case Manager 

## 2013-09-11 ENCOUNTER — Inpatient Hospital Stay (HOSPITAL_COMMUNITY): Payer: Managed Care, Other (non HMO)

## 2013-09-11 DIAGNOSIS — R509 Fever, unspecified: Secondary | ICD-10-CM

## 2013-09-11 LAB — BASIC METABOLIC PANEL
BUN: 18 mg/dL (ref 6–23)
CALCIUM: 9.1 mg/dL (ref 8.4–10.5)
CO2: 26 mEq/L (ref 19–32)
Chloride: 99 mEq/L (ref 96–112)
Creatinine, Ser: 1.13 mg/dL — ABNORMAL HIGH (ref 0.50–1.10)
GFR, EST AFRICAN AMERICAN: 60 mL/min — AB (ref >90)
GFR, EST NON AFRICAN AMERICAN: 52 mL/min — AB (ref >90)
GLUCOSE: 131 mg/dL — AB (ref 70–99)
POTASSIUM: 4.9 meq/L (ref 3.7–5.3)
Sodium: 138 mEq/L (ref 137–147)

## 2013-09-11 LAB — GLUCOSE, CAPILLARY
GLUCOSE-CAPILLARY: 242 mg/dL — AB (ref 70–99)
GLUCOSE-CAPILLARY: 52 mg/dL — AB (ref 70–99)
Glucose-Capillary: 161 mg/dL — ABNORMAL HIGH (ref 70–99)
Glucose-Capillary: 233 mg/dL — ABNORMAL HIGH (ref 70–99)
Glucose-Capillary: 208 mg/dL — ABNORMAL HIGH (ref 70–99)

## 2013-09-11 LAB — URIC ACID: Uric Acid, Serum: 2.5 mg/dL (ref 2.4–7.0)

## 2013-09-11 MED ORDER — GLUCOSE 40 % PO GEL: 1.0000 | ORAL | Status: DC | PRN

## 2013-09-11 MED ORDER — GLUCOSE-VITAMIN C 4-6 GM-MG PO CHEW: 4.0000 | CHEWABLE_TABLET | ORAL | Status: DC | PRN

## 2013-09-11 NOTE — Progress Notes (Signed)
Inpatient Diabetes Program Recommendations  AACE/ADA: New Consensus Statement on Inpatient Glycemic Control (2013)  Target Ranges:  Prepandial:   less than 140 mg/dL      Peak postprandial:   less than 180 mg/dL (1-2 hours)      Critically ill patients:  140 - 180 mg/dL     Results for Kaitlin Hudson, Kaitlin Hudson (MRN [id number]) as of 09/11/2013 11:52  Ref. Range 09/10/2013 06:40 09/10/2013 11:13 09/10/2013 23:09 09/10/2013 23:44  Glucose-Capillary Latest Range: 70-99 mg/dL 254 (H) 270 (H) 64 (L) 89     Patient with mild episode of HYPOglycemia late last night.  Spoke with patient about this event.  Patient told me that 64 mg/dl was not that low for her and she only felt a little hungry.  Explained to patient that we will offer her hypoglycemia treatment with PO fluids or glucose tablets for any CBG <70 mg/dl.  Patient agreeable.  Still appropriate to use insulin pump in hospital.  Patient told me her husband can bring her insulin pump supplies from home.   Will follow Ambrose Finland RN, MSN, CDE Diabetes Coordinator Inpatient Diabetes Program Team Pager: [phone] (8a-10p)

## 2013-09-11 NOTE — Progress Notes (Signed)
[facility] PROGRESS NOTE  ASSIA MEANOR YBO:[id number] DOB: [birth year] DOA: 09/08/2013 PCP: [name] Brief narrative: a 58 y.o. female who presents to the ED with RLE pain. RLE pain, swelling, and redness since this morning.she was Unable to bear weight as a result. Has scabbed area to lateral midshaft leg but unaware of injury.  Assessment/Plan: Right lower extremity cellulitis -improving clinically, blood cultures so far with no growth -continue vancomycin and Zosyn pending final cultures -MRI of right foot neg for abscess and no osteo, Erosions in tarsometatarsal joints- ?Gout, will obtain uric acid level and follow -Continue pain management Fevers - secondary to above, fever curve improved on current abx -cultures so far neg, continue vancomycin and Zosyn as above Diabetes mellitus -Continue insulin pump  Probable mild Plantar fasciitis &Tenosynovitis -per MRI, continue conservative/pain management L. Ankle pain - obtain x-ray of L. Ankle and follow  Code Status: Full Family Communication: Husband at bedside Disposition Plan: To home when medically ready   Consultants:  None  Procedures:  None  Antibiotics:  Clindamycin 6/28> 6/28  Vancomycin and Zosyn started 6/29  HPI/Subjective: States R. Foot pain, redness and swelling less today, c/o L ankle pain, thinks she twisted ankle yesterday when transferring  Objective: Filed Vitals:   09/11/13 0644  BP: 107/39  Pulse: 95  Temp: 99.9 F (37.7 C)  Resp: 16    Intake/Output Summary (Last 24 hours) at 09/11/13 1108 Last data filed at 09/11/13 0800  Gross per 24 hour  Intake    720 ml  Output      0 ml  Net    720 ml   Filed Weights   09/08/13 1909  Weight: 78.019 kg (172 lb)    Exam:  General: alert & oriented x 3 In NAD Cardiovascular: RRR, nl S1 s2 Respiratory: CTAB Abdomen: soft +BS NT/ND, no masses palpable Extremities: Right lower extremity-decreased erythema, tenderness,and edema.  L. Ankle tender> medially and edematous. No erythema   Data Reviewed: Basic Metabolic Panel:  Recent Labs Lab 09/08/13 2030  NA 140  K 4.4  CL 102  CO2 25  GLUCOSE 88  BUN 25*  CREATININE 1.10  CALCIUM 9.5   Liver Function Tests: No results found for this basename: AST, ALT, ALKPHOS, BILITOT, PROT, ALBUMIN,  in the last 168 hours No results found for this basename: LIPASE, AMYLASE,  in the last 168 hours No results found for this basename: AMMONIA,  in the last 168 hours CBC:  Recent Labs Lab 09/08/13 2030  WBC 13.2*  NEUTROABS 9.3*  HGB 11.5*  HCT 34.7*  MCV 87.0  PLT 247   Cardiac Enzymes: No results found for this basename: CKTOTAL, CKMB, CKMBINDEX, TROPONINI,  in the last 168 hours BNP (last 3 results) No results found for this basename: PROBNP,  in the last 8760 hours CBG:  Recent Labs Lab 09/10/13 0640 09/10/13 1113 09/10/13 2309 09/10/13 2344 09/11/13 0639  GLUCAP 158* 187* 64* 89 242*    Recent Results (from the past 240 hour(s))  CULTURE, BLOOD (ROUTINE X 2)     Status: None   Collection Time    09/08/13 10:00 PM      Result Value Ref Range Status   Specimen Description BLOOD LEFT ANTECUBITAL   Final   Special Requests     Final   Value: BOTTLES DRAWN AEROBIC AND ANAEROBIC AER 1cc ANA 1 CC   Culture  Setup Time     Final   Value: 09/09/2013 02:21  Performed at [facility]     Final   Value:        BLOOD CULTURE RECEIVED NO GROWTH TO DATE CULTURE WILL BE HELD FOR 5 DAYS BEFORE ISSUING A FINAL NEGATIVE REPORT     Performed at [facility]   Report Status PENDING   Incomplete  CULTURE, BLOOD (ROUTINE X 2)     Status: None   Collection Time    09/08/13 10:01 PM      Result Value Ref Range Status   Specimen Description BLOOD LEFT HAND   Final   Special Requests     Final   Value: BOTTLES DRAWN AEROBIC AND ANAEROBIC AER 5cc ANA 5cc   Culture  Setup Time     Final   Value: 09/09/2013 02:21     Performed at  [facility]   Culture     Final   Value:        BLOOD CULTURE RECEIVED NO GROWTH TO DATE CULTURE WILL BE HELD FOR 5 DAYS BEFORE ISSUING A FINAL NEGATIVE REPORT     Performed at [facility]   Report Status PENDING   Incomplete     Studies: Mr Foot Right W Wo Contrast  09/11/2013   CLINICAL DATA:  Diabetic patient with right lower extremity redness, pain and swelling. Fever.  EXAM: MRI OF THE RIGHT FOREFOOT WITHOUT AND WITH CONTRAST  TECHNIQUE: Multiplanar, multisequence MR imaging was performed both before and after administration of intravenous contrast.  CONTRAST:  17 ML MULTIHANCE GADOBENATE DIMEGLUMINE 529 MG/ML IV SOLN  COMPARISON:  Plain films the right foot 09/22/2012.  FINDINGS: There is diffuse subcutaneous edema about the foot. No bone marrow signal abnormality to suggest osteomyelitis is identified. The appear to be erosions about the tarsometatarsal joints, worst at the third TMT joint. No bone marrow signal abnormality or enhancement to suggest osteomyelitis is identified. 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 There is no abscess. Visualized intrinsic musculature the foot and all image tendons are intact.  IMPRESSION: Subcutaneous edema compatible with dependent change or cellulitis. Negative for abscess or osteomyelitis.  Erosions about the tarsometatarsal joints may be due to neuropathic change or gouty arthropathy.   Electronically Signed   By: Drusilla Kanner M.D.   On: 09/11/2013 07:44   Mr Ankle Right W Wo Contrast  09/11/2013   CLINICAL DATA:  Diabetic patient with right lower extremity pain, redness and swelling. Fever.  EXAM: MRI OF THE RIGHT ANKLE WITHOUT AND WITH CONTRAST  TECHNIQUE: Multiplanar, multisequence MR imaging of the ankle was performed before and after the administration of intravenous contrast.  CONTRAST:  17 mL MULTIHANCE GADOBENATE DIMEGLUMINE 529 MG/ML IV SOLN  COMPARISON:  Plain films right lower leg  09/22/2012.  FINDINGS: There is diffuse subcutaneous edema with postcontrast enhancement about the lower leg and hindfoot consistent with cellulitis. No abscess is identified. No bone marrow signal abnormality to suggest osteomyelitis is present  TENDONS  Peroneal: The peroneus longus is markedly thickened at the distal fibula consistent with chronic tear. There also appears to be a chronic tear of the peroneus brevis.  Posteromedial: Small amount fluid and enhancement about the flexor hallucis longus tendon is consistent with tenosynovitis. The tendon is intact. The tibialis posterior and flexor digitorum longus tendons are unremarkable.  Anterior: Intact.  Achilles: Intact.  Plantar Fascia: Mildly increased T2 signal is seen in the medial cord of the plantar fascia attachment to  the calcaneus consistent with fasciitis.  LIGAMENTS  Lateral: Intact.  Medial: Intact.  CARTILAGE  Ankle Joint: Unremarkable.  Subtalar Joints/Sinus Tarsi: Appears normal.  Bones: Nonunion of a fracture the base of the fifth metatarsal is identified. There appear to be erosions about the tarsometatarsal joints, worst at the third.  IMPRESSION: IMPRESSION Findings consistent with cellulitis about the right lower leg and ankle without abscess or osteomyelitis.  Tears of the peroneus longus and brevis tendons appear chronic.  Mild appearing tenosynovitis of the flexor hallucis longus.  Mild appearing plantar fasciitis.  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  Erosions about the tarsometatarsal joints compatible with gouty arthropathy or neuropathic change.   Electronically Signed   By: Drusilla Kanner M.D.   On: 09/11/2013 07:54    Scheduled Meds: . aspirin EC  81 mg Oral Daily  . heparin  5,000 Units Subcutaneous 3 times per day  . insulin pump   Subcutaneous TID AC, HS, 0200  . leflunomide  20 mg Oral Daily  . olmesartan  40 mg Oral Daily  . piperacillin-tazobactam (ZOSYN)  IV  3.375 g Intravenous 3 times per day  .  prednisoLONE  10 mg Oral Daily  . simvastatin  10 mg Oral q1800  . vancomycin  1,250 mg Intravenous Q24H   Continuous Infusions:   Principal Problem:   Cellulitis of right lower extremity Active Problems:   IDDM (insulin dependent diabetes mellitus)    Time spent: 25    [facility]  [facility] Pager [phone]. If 7PM-7AM, please contact night-coverage at [email or web address], password [facility] 09/11/2013, 11:08 AM  LOS: 3 days

## 2013-09-11 NOTE — Progress Notes (Signed)
Hypoglycemic Event  CBG: 64  Treatment: 15 GM carbohydrate snack  Symptoms: Hungry  Follow-up CBG: Time:2344 CBG Result:89  Possible Reasons for Event: Inadequate meal intake and Other: per pt, over bolus to accomodate for medications  Comments/MD notified:pt wears insulin pump    Kaitlin Hudson A  Remember to initiate Hypoglycemia Order Set & complete

## 2013-09-12 DIAGNOSIS — M069 Rheumatoid arthritis, unspecified: Secondary | ICD-10-CM

## 2013-09-12 DIAGNOSIS — I5032 Chronic diastolic (congestive) heart failure: Secondary | ICD-10-CM | POA: Diagnosis present

## 2013-09-12 LAB — CBC
HCT: 29.5 % — ABNORMAL LOW (ref 36.0–46.0)
Hemoglobin: 9.8 g/dL — ABNORMAL LOW (ref 12.0–15.0)
MCH: 28.8 pg (ref 26.0–34.0)
MCHC: 33.2 g/dL (ref 30.0–36.0)
MCV: 86.8 fL (ref 78.0–100.0)
PLATELETS: 298 10*3/uL (ref 150–400)
RBC: 3.4 MIL/uL — ABNORMAL LOW (ref 3.87–5.11)
RDW: 14.1 % (ref 11.5–15.5)
WBC: 8.2 10*3/uL (ref 4.0–10.5)

## 2013-09-12 LAB — BASIC METABOLIC PANEL
BUN: 22 mg/dL (ref 6–23)
CHLORIDE: 98 meq/L (ref 96–112)
CO2: 23 mEq/L (ref 19–32)
Calcium: 8.3 mg/dL — ABNORMAL LOW (ref 8.4–10.5)
Creatinine, Ser: 1.17 mg/dL — ABNORMAL HIGH (ref 0.50–1.10)
GFR calc non Af Amer: 50 mL/min — ABNORMAL LOW (ref >90)
GFR, EST AFRICAN AMERICAN: 58 mL/min — AB (ref >90)
Glucose, Bld: 222 mg/dL — ABNORMAL HIGH (ref 70–99)
Potassium: 4.3 mEq/L (ref 3.7–5.3)
Sodium: 134 mEq/L — ABNORMAL LOW (ref 137–147)

## 2013-09-12 LAB — GLUCOSE, CAPILLARY
GLUCOSE-CAPILLARY: 221 mg/dL — AB (ref 70–99)
Glucose-Capillary: 197 mg/dL — ABNORMAL HIGH (ref 70–99)
Glucose-Capillary: 261 mg/dL — ABNORMAL HIGH (ref 70–99)
Glucose-Capillary: 69 mg/dL — ABNORMAL LOW (ref 70–99)

## 2013-09-12 LAB — PRO B NATRIURETIC PEPTIDE: PRO B NATRI PEPTIDE: 107.5 pg/mL (ref 0–125)

## 2013-09-12 MED ORDER — CLINDAMYCIN HCL 300 MG PO CAPS: 300.0000 mg | ORAL_CAPSULE | Freq: Three times a day (TID) | ORAL | Status: DC

## 2013-09-12 MED ORDER — BUMETANIDE 0.5 MG PO TABS
0.5000 mg | ORAL_TABLET | Freq: Every day | ORAL | Status: DC
  Administered 2013-09-12: 0.5 mg via ORAL
  Filled 2013-09-12: qty 1

## 2013-09-12 MED ORDER — OXYCODONE HCL 5 MG PO CAPS: 5.0000 mg | ORAL_CAPSULE | ORAL | Status: DC | PRN

## 2013-09-13 NOTE — Discharge Summary (Signed)
Physician Discharge Summary  Kaitlin Hudson QJJ:[id number] DOB: Nov 02, 1955 DOA: 09/08/2013  PCP: [name]  Admit date: 09/08/2013 Discharge date: 7//2015  Time spent: 25 minutes  Recommendations for Outpatient Follow-up:  1. New medication: clindamycin 300 mg 3 times a day x3 more days 2. New medication: OxyIR 5 mg by mouth every 4 hours when necessary for pain  Discharge Diagnoses:  Principal Problem:   Cellulitis of right lower extremity Active Problems:   IDDM (insulin dependent diabetes mellitus)   Chronic diastolic heart failure   Rheumatoid arthritis   Discharge Condition: Improved, being discharged home  Diet recommendation: Heart healthy  Filed Weights   09/08/13 1909  Weight: 78.019 kg (172 lb)    History of present illness:  58 year old white female with past medical history diastolic heart failure and rheumatoid arthritis presented on 6/27 evening with right lower extremity pain and swelling and redness unable to bear weight. Given dose of clindamycin and admitted to the hospitalist service  Hospital Course:  Principal Problem:   Cellulitis of right lower extremity: Patient started on IV vancomycin and Zosyn. Blood cultures drawn and were unremarkable. An MRI of the right foot was negative for abscess and negative for ostomy mellitus. She was noted to have some erosion in the tarsometatarsal joints and so serum uric acid level was obtained to rule out gout, this was normal. White blood cell count continued to improve and by 7/1, normalized. At this point she was able to ambulate. She already completed 4 days of IV antibiotics and switched over 3 more days of by mouth and discharged home   Active Problems:   IDDM (insulin dependent diabetes mellitus): Patient has had some difficulty controlling her sugars because of her recent prednisone taper. Overall stable. She will continue her insulin pump  Chronic diastolic heart failure: BNP checked and found to be normal on  day of discharge.    Rheumatoid arthritis: Stable medical issues. Patient had been on a recent prednisone taper and is back on her baseline at 10 mg a day   Procedures:  None  Consultations:  None  Discharge Exam: Filed Vitals:   09/12/13 1300  BP: 112/54  Pulse: 96  Temp: 99.1 F (37.3 C)  Resp: 16    General: Alert and oriented x3, no acute distress  Cardiovascular: Regular rate and rhythm, S1-S2  Respiratory: Click auscultation bilaterally Extremities: Right foot minimally swollen, nontender, minimal if any erythema  Discharge Instructions You were cared for by a hospitalist during your hospital stay. If you have any questions about your discharge medications or the care you received while you were in the hospital after you are discharged, you can call the unit and asked to speak with the hospitalist on call if the hospitalist that took care of you is not available. Once you are discharged, your primary care physician will handle any further medical issues. Please note that NO REFILLS for any discharge medications will be authorized once you are discharged, as it is imperative that you return to your primary care physician (or establish a relationship with a primary care physician if you do not have one) for your aftercare needs so that they can reassess your need for medications and monitor your lab values.  Discharge Instructions   Diet - low sodium heart healthy    Complete by:  As directed      Increase activity slowly    Complete by:  As directed  Medication List         aspirin EC 81 MG tablet  Take 81 mg by mouth daily.     bumetanide 1 MG tablet  Commonly known as:  BUMEX  Take 0.5 mg by mouth daily.     clindamycin 300 MG capsule  Commonly known as:  CLEOCIN  Take 1 capsule (300 mg total) by mouth 3 (three) times daily.     NOVOLOG [place]  Inject into the skin. PUMP, 12am 0.9  2:30am 0.9  8am 1.0  2pm 2.1  6:30pm 1.2  9pm 1.0     olmesartan  40 MG tablet  Commonly known as:  BENICAR  Take 40 mg by mouth daily.     oxycodone 5 MG capsule  Commonly known as:  OXY-IR  Take 1 capsule (5 mg total) by mouth every 4 (four) hours as needed.     predniSONE 5 MG tablet  Commonly known as:  DELTASONE  Take 10 mg by mouth daily with breakfast.     risedronate 150 MG tablet  Commonly known as:  ACTONEL  Take 150 mg by mouth every 30 (thirty) days. with water on empty stomach, nothing by mouth or lie down for next 30 minutes.     Vitamin D 2000 UNITS tablet  Take 4,000 Units by mouth at bedtime.       Allergies  Allergen Reactions  . Cimzia [Certolizumab Pegol]     bleeding  . Clindamycin/Lincomycin Diarrhea  . Diclofenac Nausea Only  . Enbrel [Etanercept] Hives  . Erythromycin     Severe stomach cramping  . Rynatan [Chlorpheniramine-Phenyleph Er]     unsure  . Tramadol Nausea And Vomiting  . Keflet [Cephalexin] Rash    Severe stomach cramping  . Sulfa Antibiotics Rash      The results of significant diagnostics from this hospitalization (including imaging, microbiology, ancillary and laboratory) are listed below for reference.    Significant Diagnostic Studies: Dg Ankle Complete Left  09/23/2013   CLINICAL DATA:  Left ankle pain and swelling  EXAM: LEFT ANKLE COMPLETE - 3+ VIEW  COMPARISON:  MR from the previous day  FINDINGS: Generalized soft tissue swelling is noted. Diffuse vascular calcifications are seen. No acute fracture or dislocation is noted. Mild tarsal degenerative change is seen.  IMPRESSION: Chronic changes without acute abnormality.   Electronically Signed   By: Alcide Clever M.D.   On: September 23, 2013 15:45   Mr Foot Right W Wo Contrast  09-23-2013   CLINICAL DATA:  Diabetic patient with right lower extremity redness, pain and swelling. Fever.  EXAM: MRI OF THE RIGHT FOREFOOT WITHOUT AND WITH CONTRAST  TECHNIQUE: Multiplanar, multisequence MR imaging was performed both before and after administration of  intravenous contrast.  CONTRAST:  17 ML MULTIHANCE GADOBENATE DIMEGLUMINE 529 MG/ML IV SOLN  COMPARISON:  Plain films the right foot 09/22/2012.  FINDINGS: There is diffuse subcutaneous edema about the foot. No bone marrow signal abnormality to suggest osteomyelitis is identified. The appear to be erosions about the tarsometatarsal joints, worst at the third TMT joint. No bone marrow signal abnormality or enhancement to suggest osteomyelitis is identified. 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 There is no abscess. Visualized intrinsic musculature the foot and all image tendons are intact.  IMPRESSION: Subcutaneous edema compatible with dependent change or cellulitis. Negative for abscess or osteomyelitis.  Erosions about the tarsometatarsal joints may be due to neuropathic change or gouty arthropathy.   Electronically  Signed   By: Drusilla Kanner M.D.   On: 09/11/2013 07:44   Mr Ankle Right W Wo Contrast  09/11/2013   CLINICAL DATA:  Diabetic patient with right lower extremity pain, redness and swelling. Fever.  EXAM: MRI OF THE RIGHT ANKLE WITHOUT AND WITH CONTRAST  TECHNIQUE: Multiplanar, multisequence MR imaging of the ankle was performed before and after the administration of intravenous contrast.  CONTRAST:  17 mL MULTIHANCE GADOBENATE DIMEGLUMINE 529 MG/ML IV SOLN  COMPARISON:  Plain films right lower leg 09/22/2012.  FINDINGS: There is diffuse subcutaneous edema with postcontrast enhancement about the lower leg and hindfoot consistent with cellulitis. No abscess is identified. No bone marrow signal abnormality to suggest osteomyelitis is present  TENDONS  Peroneal: The peroneus longus is markedly thickened at the distal fibula consistent with chronic tear. There also appears to be a chronic tear of the peroneus brevis.  Posteromedial: Small amount fluid and enhancement about the flexor hallucis longus tendon is consistent with tenosynovitis. The tendon is  intact. The tibialis posterior and flexor digitorum longus tendons are unremarkable.  Anterior: Intact.  Achilles: Intact.  Plantar Fascia: Mildly increased T2 signal is seen in the medial cord of the plantar fascia attachment to the calcaneus consistent with fasciitis.  LIGAMENTS  Lateral: Intact.  Medial: Intact.  CARTILAGE  Ankle Joint: Unremarkable.  Subtalar Joints/Sinus Tarsi: Appears normal.  Bones: Nonunion of a fracture the base of the fifth metatarsal is identified. There appear to be erosions about the tarsometatarsal joints, worst at the third.  IMPRESSION: IMPRESSION Findings consistent with cellulitis about the right lower leg and ankle without abscess or osteomyelitis.  Tears of the peroneus longus and brevis tendons appear chronic.  Mild appearing tenosynovitis of the flexor hallucis longus.  Mild appearing plantar fasciitis.  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  Erosions about the tarsometatarsal joints compatible with gouty arthropathy or neuropathic change.   Electronically Signed   By: Drusilla Kanner M.D.   On: 09/11/2013 07:54   US Venous Img Lower Unilateral Right  09/08/2013   CLINICAL DATA:  Non painful cut to the [facility].  Leg pain.  EXAM: Right LOWER EXTREMITY VENOUS DOPPLER ULTRASOUND  TECHNIQUE: Gray-scale sonography with graded compression, as well as color Doppler and duplex ultrasound were performed to evaluate the lower extremity deep venous systems from the level of the common femoral vein and including the common femoral, femoral, profunda femoral, popliteal and calf veins including the posterior tibial, peroneal and gastrocnemius veins when visible. The superficial great saphenous vein was also interrogated. Spectral Doppler was utilized to evaluate flow at rest and with distal augmentation maneuvers in the common femoral, femoral and popliteal veins.  COMPARISON:  None.  FINDINGS: Common Femoral Vein: No evidence of thrombus. Normal compressibility, respiratory  phasicity and response to augmentation.  Saphenofemoral Junction: No evidence of thrombus. Normal compressibility and flow on color Doppler imaging.  Profunda Femoral Vein: No evidence of thrombus. Normal compressibility and flow on color Doppler imaging.  Femoral Vein: No evidence of thrombus.  Popliteal Vein: No evidence of thrombus.  Calf Veins: No evidence of thrombus. Note that the peroneal vein was not visualized.  Superficial Great Saphenous Vein: No evidence of thrombus.  In the popliteal fossa there is an elongated hypoechoic fluid collection which is directed towards the posterior joint line, 1 x 1.4 x 0.6 cm.  IMPRESSION: 1. No evidence of deep venous thrombosis in the right lower extremity. 2. 1.4 cm fluid collection in the popliteal fossa,  likely a Baker's cyst.   Electronically Signed   By: Tiburcio Pea M.D.   On: 09/08/2013 21:42    Microbiology: Recent Results (from the past 240 hour(s))  CULTURE, BLOOD (ROUTINE X 2)     Status: None   Collection Time    09/08/13 10:00 PM      Result Value Ref Range Status   Specimen Description BLOOD LEFT ANTECUBITAL   Final   Special Requests     Final   Value: BOTTLES DRAWN AEROBIC AND ANAEROBIC AER 1cc ANA 1 CC   Culture  Setup Time     Final   Value: 09/09/2013 02:21     Performed at [facility]   Culture     Final   Value:        BLOOD CULTURE RECEIVED NO GROWTH TO DATE CULTURE WILL BE HELD FOR 5 DAYS BEFORE ISSUING A FINAL NEGATIVE REPORT     Performed at [facility]   Report Status PENDING   Incomplete  CULTURE, BLOOD (ROUTINE X 2)     Status: None   Collection Time    09/08/13 10:01 PM      Result Value Ref Range Status   Specimen Description BLOOD LEFT HAND   Final   Special Requests     Final   Value: BOTTLES DRAWN AEROBIC AND ANAEROBIC AER 5cc ANA 5cc   Culture  Setup Time     Final   Value: 09/09/2013 02:21     Performed at [facility]   Culture     Final   Value:        BLOOD CULTURE RECEIVED  NO GROWTH TO DATE CULTURE WILL BE HELD FOR 5 DAYS BEFORE ISSUING A FINAL NEGATIVE REPORT     Performed at [facility]   Report Status PENDING   Incomplete     Labs: Basic Metabolic Panel:  Recent Labs Lab 09/08/13 2030 09/11/13 1200 09/12/13 0546  NA 140 138 134*  K 4.4 4.9 4.3  CL 102 99 98  CO2 25 26 23   GLUCOSE 88 131* 222*  BUN 25* 18 22  CREATININE 1.10 1.13* 1.17*  CALCIUM 9.5 9.1 8.3*   Liver Function Tests: No results found for this basename: AST, ALT, ALKPHOS, BILITOT, PROT, ALBUMIN,  in the last 168 hours No results found for this basename: LIPASE, AMYLASE,  in the last 168 hours No results found for this basename: AMMONIA,  in the last 168 hours CBC:  Recent Labs Lab 09/08/13 2030 09/12/13 0546  WBC 13.2* 8.2  NEUTROABS 9.3*  --   HGB 11.5* 9.8*  HCT 34.7* 29.5*  MCV 87.0 86.8  PLT 247 298   Cardiac Enzymes: No results found for this basename: CKTOTAL, CKMB, CKMBINDEX, TROPONINI,  in the last 168 hours BNP: BNP (last 3 results)  Recent Labs  09/12/13 1510  PROBNP 107.5   CBG:  Recent Labs Lab 09/11/13 2148 09/12/13 0056 09/12/13 0647 09/12/13 1113 09/12/13 1612  GLUCAP 208* 221* 197* 261* 69*       Signed:  Bijou Easler K  [facility] 09/13/2013, 5:47 PM

## 2013-09-15 LAB — CULTURE, BLOOD (ROUTINE X 2)
CULTURE: NO GROWTH
Culture: NO GROWTH

## 2014-02-26 HISTORY — PX: FOOT SURGERY: SHX648

## 2014-07-11 HISTORY — PX: FINGER SURGERY: SHX640

## 2015-11-07 ENCOUNTER — Encounter (HOSPITAL_BASED_OUTPATIENT_CLINIC_OR_DEPARTMENT_OTHER): Payer: Self-pay | Admitting: *Deleted

## 2015-11-07 ENCOUNTER — Emergency Department (HOSPITAL_BASED_OUTPATIENT_CLINIC_OR_DEPARTMENT_OTHER)
Admission: EM | Admit: 2015-11-07 | Discharge: 2015-11-08 | Disposition: A | Discharge status: Home / Self Care | Payer: Managed Care, Other (non HMO) | Attending: Emergency Medicine | Admitting: Emergency Medicine

## 2015-11-07 ENCOUNTER — Emergency Department (HOSPITAL_BASED_OUTPATIENT_CLINIC_OR_DEPARTMENT_OTHER): Payer: Managed Care, Other (non HMO)

## 2015-11-07 DIAGNOSIS — G8929 Other chronic pain: Secondary | ICD-10-CM | POA: Insufficient documentation

## 2015-11-07 DIAGNOSIS — Z794 Long term (current) use of insulin: Secondary | ICD-10-CM | POA: Diagnosis not present

## 2015-11-07 DIAGNOSIS — N390 Urinary tract infection, site not specified: Secondary | ICD-10-CM | POA: Diagnosis not present

## 2015-11-07 DIAGNOSIS — I5032 Chronic diastolic (congestive) heart failure: Secondary | ICD-10-CM | POA: Diagnosis not present

## 2015-11-07 DIAGNOSIS — Z7982 Long term (current) use of aspirin: Secondary | ICD-10-CM | POA: Diagnosis not present

## 2015-11-07 DIAGNOSIS — E109 Type 1 diabetes mellitus without complications: Secondary | ICD-10-CM | POA: Insufficient documentation

## 2015-11-07 DIAGNOSIS — Z79899 Other long term (current) drug therapy: Secondary | ICD-10-CM | POA: Insufficient documentation

## 2015-11-07 DIAGNOSIS — I110 Hypertensive heart disease with heart failure: Secondary | ICD-10-CM | POA: Insufficient documentation

## 2015-11-07 DIAGNOSIS — M25512 Pain in left shoulder: Secondary | ICD-10-CM | POA: Diagnosis not present

## 2015-11-07 DIAGNOSIS — R509 Fever, unspecified: Secondary | ICD-10-CM | POA: Diagnosis present

## 2015-11-07 LAB — URINE MICROSCOPIC-ADD ON: Squamous Epithelial / LPF: NONE SEEN

## 2015-11-07 LAB — URINALYSIS, ROUTINE W REFLEX MICROSCOPIC
BILIRUBIN URINE: NEGATIVE
Glucose, UA: 250 mg/dL — AB
Ketones, ur: NEGATIVE mg/dL
Nitrite: POSITIVE — AB
PROTEIN: NEGATIVE mg/dL
Specific Gravity, Urine: 1.016 (ref 1.005–1.030)
pH: 5.5 (ref 5.0–8.0)

## 2015-11-07 MED ORDER — ONDANSETRON HCL 4 MG/2ML IJ SOLN
4.0000 mg | Freq: Once | INTRAMUSCULAR | Status: DC
  Filled 2015-11-07: qty 2

## 2015-11-07 MED ORDER — CIPROFLOXACIN IN D5W 400 MG/200ML IV SOLN
400.0000 mg | Freq: Once | INTRAVENOUS | Status: AC
  Administered 2015-11-08: 400 mg via INTRAVENOUS
  Filled 2015-11-07: qty 200

## 2015-11-07 MED ORDER — ACETAMINOPHEN 500 MG PO TABS
1000.0000 mg | ORAL_TABLET | Freq: Once | ORAL | Status: AC
  Administered 2015-11-07: 1000 mg via ORAL
  Filled 2015-11-07: qty 2

## 2015-11-07 MED ORDER — CEFTRIAXONE SODIUM 1 G IJ SOLR: 1.0000 g | Freq: Once | INTRAMUSCULAR | Status: DC

## 2015-11-07 MED ORDER — SODIUM CHLORIDE 0.9 % IV BOLUS (SEPSIS)
1000.0000 mL | Freq: Once | INTRAVENOUS | Status: AC
  Administered 2015-11-08: 1000 mL via INTRAVENOUS

## 2015-11-07 NOTE — ED Provider Notes (Signed)
[facility] Provider Note   CSN: [id number] Arrival date & time: 11/07/15  2002  By signing my name below, I, Soijett Blue, attest that this documentation has been prepared under the direction and in the presence of Tilden Fossa, MD. Electronically Signed: Soijett Blue, ED Scribe. 11/07/15. 10:59 PM    History   Chief Complaint Chief Complaint  Patient presents with  . Fever    HPI  Kaitlin Hudson is a 60 y.o. female with a PMHx of CHF, DM, HTN, hyperlipidemia, arthritis, who presents to the Emergency Department complaining of fever of 99 onset last night. Pt notes that her fever tonight at 7 PM PTA was 103. Denies sick contacts or tick bites. She states that she is having associated symptoms of back pain (bilateral flank). She states that she has not tried any medications for the relief for her symptoms. She denies sore throat, cough, abdominal pain, dysuria, rash, and any other symptoms. Pt notes that she takes 5 mg daily of prednisone for her left shoulder pain that is due to rheumatoid arthritis.  Her fever was preceded by malaise this week.  Sxs are moderate, constant, worsening.  She vomited once on the way to the ED but attributes it to motion sickness in the vehicle.    The history is provided by the patient. No language interpreter was used.    Past Medical History:  Diagnosis Date  . Arthritis, rheumatoid (HCC)   . CHF (congestive heart failure) (HCC)   . Diabetes mellitus without complication (HCC)   . Hyperlipidemia   . Hypertension   . Latent tuberculosis     Patient Active Problem List   Diagnosis Date Noted  . Chronic diastolic heart failure (HCC) 09/12/2013  . Rheumatoid arthritis (HCC) 09/12/2013  . IDDM (insulin dependent diabetes mellitus) (HCC) 09/09/2013  . Cellulitis of right lower extremity 09/08/2013    Past Surgical History:  Procedure Laterality Date  . FOOT SURGERY      OB History    No data available       Home  Medications    Prior to Admission medications   Medication Sig Start Date End Date Taking? Authorizing Provider  aspirin EC 81 MG tablet Take 81 mg by mouth daily.   Yes Historical Provider, MD  Cholecalciferol (VITAMIN D) 2000 UNITS tablet Take 4,000 Units by mouth at bedtime.   Yes Historical Provider, MD  Insulin Aspart (NOVOLOG ) Inject into the skin. PUMP, 12am 0.9  2:30am 0.9  8am 1.0  2pm 2.1  6:30pm 1.2  9pm 1.0   Yes Historical Provider, MD  olmesartan (BENICAR) 40 MG tablet Take 40 mg by mouth daily.   Yes Historical Provider, MD  predniSONE (DELTASONE) 5 MG tablet Take 10 mg by mouth daily with breakfast.   Yes Historical Provider, MD  risedronate (ACTONEL) 150 MG tablet Take 150 mg by mouth every 30 (thirty) days. with water on empty stomach, nothing by mouth or lie down for next 30 minutes.   Yes Historical Provider, MD  bumetanide (BUMEX) 1 MG tablet Take 0.5 mg by mouth daily.    Historical Provider, MD  ciprofloxacin (CIPRO) 500 MG tablet Take 1 tablet (500 mg total) by mouth 2 (two) times daily. 11/08/15   Tilden Fossa, MD  clindamycin (CLEOCIN) 300 MG capsule Take 1 capsule (300 mg total) by mouth 3 (three) times daily. 09/12/13   Hollice Espy, MD  oxycodone (OXY-IR) 5 MG capsule Take 1 capsule (5 mg total) by  mouth every 4 (four) hours as needed. 09/12/13   Hollice Espy, MD    Family History No family history on file.  Social History Social History  Substance Use Topics  . Smoking status: Never Smoker  . Smokeless tobacco: Never Used  . Alcohol use No     Allergies   Cimzia [certolizumab pegol]; Clindamycin/lincomycin; Diclofenac; Enbrel [etanercept]; Erythromycin; Rynatan [chlorpheniramine-phenyleph er]; Tramadol; Keflet [cephalexin]; and Sulfa antibiotics   Review of Systems Review of Systems  Constitutional: Positive for fever.  HENT: Negative for sore throat.   Respiratory: Negative for cough.   Gastrointestinal: Negative for abdominal pain.    Genitourinary: Negative for dysuria.  Musculoskeletal: Positive for arthralgias (chronic left shoulder ) and back pain.  Skin: Negative for rash.     Physical Exam Updated Vital Signs BP (!) 125/53   Pulse 82   Temp 98.6 F (37 C) (Oral)   Resp 14   Ht 5\' 4"  (1.626 m)   Wt 180 lb (81.6 kg)   SpO2 95%   BMI 30.90 kg/m   Physical Exam  Constitutional: She is oriented to person, place, and time. She appears well-developed and well-nourished. No distress.  HENT:  Head: Normocephalic and atraumatic.  Eyes: EOM are normal.  Neck: Neck supple.  Cardiovascular: Normal rate, regular rhythm and normal heart sounds.  Exam reveals no gallop and no friction rub.   No murmur heard. Pulmonary/Chest: Effort normal and breath sounds normal. No respiratory distress. She has no wheezes. She has no rales.  Abdominal: Soft. She exhibits no distension. There is no tenderness.  Musculoskeletal: Normal range of motion.  Neurological: She is alert and oriented to person, place, and time.  Skin: Skin is warm and dry.  Psychiatric: She has a normal mood and affect. Her behavior is normal.  Nursing note and vitals reviewed.    ED Treatments / Results  DIAGNOSTIC STUDIES: Oxygen Saturation is 97% on RA, nl by my interpretation.    COORDINATION OF CARE: 11:00 PM Discussed treatment plan with pt at bedside which includes UA, labs, CXR, and pt agreed to plan.   Labs (all labs ordered are listed, but only abnormal results are displayed) Labs Reviewed  URINALYSIS, ROUTINE W REFLEX MICROSCOPIC (NOT AT [facility]) - Abnormal; Notable for the following:       Result Value   Glucose, UA 250 (*)    Hgb urine dipstick SMALL (*)    Nitrite POSITIVE (*)    Leukocytes, UA MODERATE (*)    All other components within normal limits  URINE MICROSCOPIC-ADD ON - Abnormal; Notable for the following:    Bacteria, UA FEW (*)    All other components within normal limits  COMPREHENSIVE METABOLIC PANEL - Abnormal;  Notable for the following:    Glucose, Bld 118 (*)    BUN 24 (*)    Creatinine, Ser 1.30 (*)    Albumin 3.2 (*)    GFR calc non Af Amer 43 (*)    GFR calc Af Amer 50 (*)    All other components within normal limits  CBC WITH DIFFERENTIAL/PLATELET - Abnormal; Notable for the following:    WBC 12.2 (*)    Hemoglobin 11.7 (*)    HCT 35.4 (*)    Neutro Abs 8.2 (*)    Monocytes Absolute 1.7 (*)    All other components within normal limits  CULTURE, BLOOD (ROUTINE X 2)  CULTURE, BLOOD (ROUTINE X 2)  URINE CULTURE  I-STAT CG4 LACTIC ACID, ED  EKG  EKG Interpretation None       Radiology Dg Chest 2 View  Result Date: 11/07/2015 CLINICAL DATA:  Febrile, no chest pain or shortness of breath. RIGHT posterior rib pain. History of diabetes, hypertension and CHF. EXAM: CHEST  2 VIEW COMPARISON:  None. FINDINGS: Cardiomediastinal silhouette is normal. No pleural effusions or focal consolidations. Trachea projects midline and there is no pneumothorax. Soft tissue planes and included osseous structures are non-suspicious. IMPRESSION: No active cardiopulmonary disease. Electronically Signed   By: Awilda Metro M.D.   On: 11/07/2015 23:52    Procedures Procedures (including critical care time)  Medications Ordered in ED Medications  acetaminophen (TYLENOL) tablet 1,000 mg (1,000 mg Oral Given 11/07/15 2022)  sodium chloride 0.9 % bolus 1,000 mL (0 mLs Intravenous Stopped 11/08/15 0114)  ciprofloxacin (CIPRO) IVPB 400 mg (0 mg Intravenous Stopped 11/08/15 0150)     Initial Impression / Assessment and Plan / ED Course  I have reviewed the triage vital signs and the nursing notes.  Pertinent labs & imaging results that were available during my care of the patient were reviewed by me and considered in my medical decision making (see chart for details).  Clinical Course   Patient with history of diabetes, rheumatoid arthritis with chronic immunosuppression here with malaise, fever. She  is nontoxic appearing on examination. She was febrile on ED arrival with no evidence of sepsis. BMP with mild renal insufficiency, similar compared to priors. CBC with mild leukocytosis. She did recently receive a steroid injection and she is on chronic steroids. UA is concerning for UTI in the setting of fever and bilateral flank pain. Presentation is not consistent with renal colic, epidural abscess. Discussed outpatient treatment for urinary tract infection with antibiotics as well as close home care and return precautions.  Final Clinical Impressions(s) / ED Diagnoses   Final diagnoses:  Acute UTI    New Prescriptions Discharge Medication List as of 11/08/2015 12:45 AM    START taking these medications   Details  ciprofloxacin (CIPRO) 500 MG tablet Take 1 tablet (500 mg total) by mouth 2 (two) times daily., Starting Sat 11/08/2015, Print       I personally performed the services described in this documentation, which was scribed in my presence. The recorded information has been reviewed and is accurate.     Tilden Fossa, MD 11/08/15 [phone]

## 2015-11-07 NOTE — ED Triage Notes (Signed)
Fever x 1 week.  Vomiting tonight on the way here.

## 2015-11-08 LAB — CBC WITH DIFFERENTIAL/PLATELET
Basophils Absolute: 0 10*3/uL (ref 0.0–0.1)
Basophils Relative: 0 %
Eosinophils Absolute: 0 10*3/uL (ref 0.0–0.7)
Eosinophils Relative: 0 %
HCT: 35.4 % — ABNORMAL LOW (ref 36.0–46.0)
HEMOGLOBIN: 11.7 g/dL — AB (ref 12.0–15.0)
LYMPHS ABS: 2.3 10*3/uL (ref 0.7–4.0)
Lymphocytes Relative: 19 %
MCH: 28.8 pg (ref 26.0–34.0)
MCHC: 33.1 g/dL (ref 30.0–36.0)
MCV: 87.2 fL (ref 78.0–100.0)
Monocytes Absolute: 1.7 10*3/uL — ABNORMAL HIGH (ref 0.1–1.0)
Monocytes Relative: 14 %
NEUTROS PCT: 67 %
Neutro Abs: 8.2 10*3/uL — ABNORMAL HIGH (ref 1.7–7.7)
Platelets: 250 10*3/uL (ref 150–400)
RBC: 4.06 MIL/uL (ref 3.87–5.11)
RDW: 13.6 % (ref 11.5–15.5)
WBC: 12.2 10*3/uL — AB (ref 4.0–10.5)

## 2015-11-08 LAB — COMPREHENSIVE METABOLIC PANEL
ALT: 17 U/L (ref 14–54)
AST: 16 U/L (ref 15–41)
Albumin: 3.2 g/dL — ABNORMAL LOW (ref 3.5–5.0)
Alkaline Phosphatase: 61 U/L (ref 38–126)
Anion gap: 8 (ref 5–15)
BILIRUBIN TOTAL: 0.4 mg/dL (ref 0.3–1.2)
BUN: 24 mg/dL — ABNORMAL HIGH (ref 6–20)
CHLORIDE: 107 mmol/L (ref 101–111)
CO2: 24 mmol/L (ref 22–32)
CREATININE: 1.3 mg/dL — AB (ref 0.44–1.00)
Calcium: 9 mg/dL (ref 8.9–10.3)
GFR, EST AFRICAN AMERICAN: 50 mL/min — AB (ref >60)
GFR, EST NON AFRICAN AMERICAN: 43 mL/min — AB (ref >60)
Glucose, Bld: 118 mg/dL — ABNORMAL HIGH (ref 65–99)
POTASSIUM: 3.9 mmol/L (ref 3.5–5.1)
Sodium: 139 mmol/L (ref 135–145)
TOTAL PROTEIN: 6.6 g/dL (ref 6.5–8.1)

## 2015-11-08 LAB — I-STAT CG4 LACTIC ACID, ED: Lactic Acid, Venous: 0.7 mmol/L (ref 0.5–1.9)

## 2015-11-08 MED ORDER — CIPROFLOXACIN HCL 500 MG PO TABS: 500.0000 mg | ORAL_TABLET | Freq: Two times a day (BID) | ORAL | 0 refills | Status: DC

## 2015-11-10 LAB — URINE CULTURE: Culture: >=100000 — AB

## 2015-11-11 ENCOUNTER — Telehealth (HOSPITAL_BASED_OUTPATIENT_CLINIC_OR_DEPARTMENT_OTHER): Payer: Self-pay | Admitting: Emergency Medicine

## 2015-11-11 NOTE — Telephone Encounter (Signed)
Post ED Visit - Positive Culture Follow-up  Culture report reviewed by antimicrobial stewardship pharmacist:  []  , Pharm.D. []  [name], Pharm.D., BCPS []  , Pharm.D. []  [name], Pharm.D., BCPS []  Hauser, Garvin Fila.D., BCPS, AAHIVP []  , Pharm.D., BCPS, AAHIVP []  Georgina Pillion, .D. []  [place], [address].D. PharmD  Positive urine culture Treated with ciprofloxacin, organism sensitive to the same and no further patient follow-up is required at this time.  Kaitlin Hudson 11/11/2015, 9:22 AM

## 2015-11-13 LAB — CULTURE, BLOOD (ROUTINE X 2): Culture: NO GROWTH

## 2016-07-17 ENCOUNTER — Encounter (HOSPITAL_BASED_OUTPATIENT_CLINIC_OR_DEPARTMENT_OTHER): Payer: Self-pay | Admitting: Emergency Medicine

## 2016-07-17 ENCOUNTER — Emergency Department (HOSPITAL_BASED_OUTPATIENT_CLINIC_OR_DEPARTMENT_OTHER)
Admission: EM | Admit: 2016-07-17 | Discharge: 2016-07-17 | Disposition: A | Discharge status: Home / Self Care | Payer: Managed Care, Other (non HMO) | Attending: Emergency Medicine | Admitting: Emergency Medicine

## 2016-07-17 ENCOUNTER — Emergency Department (HOSPITAL_BASED_OUTPATIENT_CLINIC_OR_DEPARTMENT_OTHER): Payer: Managed Care, Other (non HMO)

## 2016-07-17 DIAGNOSIS — M79672 Pain in left foot: Secondary | ICD-10-CM | POA: Insufficient documentation

## 2016-07-17 DIAGNOSIS — I5032 Chronic diastolic (congestive) heart failure: Secondary | ICD-10-CM | POA: Insufficient documentation

## 2016-07-17 DIAGNOSIS — Z79899 Other long term (current) drug therapy: Secondary | ICD-10-CM | POA: Diagnosis not present

## 2016-07-17 DIAGNOSIS — Z7982 Long term (current) use of aspirin: Secondary | ICD-10-CM | POA: Insufficient documentation

## 2016-07-17 DIAGNOSIS — I110 Hypertensive heart disease with heart failure: Secondary | ICD-10-CM | POA: Diagnosis not present

## 2016-07-17 DIAGNOSIS — E119 Type 2 diabetes mellitus without complications: Secondary | ICD-10-CM | POA: Insufficient documentation

## 2016-07-17 MED ORDER — PREDNISONE 20 MG PO TABS: 20.0000 mg | ORAL_TABLET | Freq: Every day | ORAL | 0 refills | Status: DC

## 2016-07-17 MED ORDER — HYDROCODONE-ACETAMINOPHEN 5-325 MG PO TABS: 1.0000 | ORAL_TABLET | ORAL | 0 refills | Status: DC | PRN

## 2016-07-17 NOTE — ED Provider Notes (Signed)
[facility] Note   CSN: [id number] Arrival date & time: 07/17/16  1518   By signing my name below, I, Clarisse Gouge, attest that this documentation has been prepared under the direction and in the presence of Raeford Razor, MD. Electronically signed, Clarisse Gouge, ED Scribe. 07/17/16. 5:32 PM.   History   Chief Complaint Chief Complaint  Patient presents with  . Foot Pain   The history is provided by the patient and medical records. No language interpreter was used.    Kaitlin Hudson is a 61 y.o. female with h/o DM and arthritis, who presents to the Emergency Department with concern for worsened recurrent L foot pain onset ~1:30 today. Associated increased activity noted today; pt states she cannot put pressure on the foot. Pt reportedly walked on gravel today while preparing for a yard sale. She describes 7/10, aching pain worse in the bottom of the foot and worsened with walking, movement of the foot and standing. Pt states she took 2 x 500 mg tylenol PTA without report of improvement. No other modifying factors noted. Pt on 5 mg prednisone daily chronically for arthritis. She notes h/o L foot swelling to the dorsal portion. No h/o known fractures noted. No other complaints at this time.  Past Medical History:  Diagnosis Date  . Arthritis, rheumatoid (HCC)   . CHF (congestive heart failure) (HCC)   . Diabetes mellitus without complication (HCC)   . Hyperlipidemia   . Hypertension   . Latent tuberculosis     Patient Active Problem List   Diagnosis Date Noted  . Chronic diastolic heart failure (HCC) 09/12/2013  . Rheumatoid arthritis (HCC) 09/12/2013  . IDDM (insulin dependent diabetes mellitus) (HCC) 09/09/2013  . Cellulitis of right lower extremity 09/08/2013    Past Surgical History:  Procedure Laterality Date  . CARPAL TUNNEL RELEASE Bilateral   . FOOT SURGERY    . JOINT REPLACEMENT      OB History    No data available       Home  Medications    Prior to Admission medications   Medication Sig Start Date End Date Taking? Authorizing Provider  aspirin EC 81 MG tablet Take 81 mg by mouth daily.   Yes [provider]  bumetanide (BUMEX) 1 MG tablet Take 0.5 mg by mouth daily.   Yes [provider]  Insulin Aspart (NOVOLOG [place]) Inject into the skin. PUMP, 12am 0.9  2:30am 0.9  8am 1.0  2pm 2.1  6:30pm 1.2  9pm 1.0   Yes [provider]  olmesartan (BENICAR) 40 MG tablet Take 40 mg by mouth daily.   Yes [provider]  predniSONE (DELTASONE) 5 MG tablet Take 10 mg by mouth daily with breakfast.   Yes [provider]  risedronate (ACTONEL) 150 MG tablet Take 150 mg by mouth every 30 (thirty) days. with water on empty stomach, nothing by mouth or lie down for next 30 minutes.   Yes [provider]  Cholecalciferol (VITAMIN D) 2000 UNITS tablet Take 4,000 Units by mouth at bedtime.    [provider]  ciprofloxacin (CIPRO) 500 MG tablet Take 1 tablet (500 mg total) by mouth 2 (two) times daily. 11/08/15   Tilden Fossa, MD  clindamycin (CLEOCIN) 300 MG capsule Take 1 capsule (300 mg total) by mouth 3 (three) times daily. 09/12/13   Hollice Espy, MD  oxycodone (OXY-IR) 5 MG capsule Take 1 capsule (5 mg total) by mouth every 4 (four) hours as  needed. 09/12/13   Hollice Espy, MD    Family History No family history on file.  Social History Social History  Substance Use Topics  . Smoking status: Never Smoker  . Smokeless tobacco: Never Used  . Alcohol use No     Allergies   Cimzia [certolizumab pegol]; Clindamycin/lincomycin; Diclofenac; Enbrel [etanercept]; Erythromycin; Rynatan [chlorpheniramine-phenyleph er]; Tramadol; Keflet [cephalexin]; Penicillins; and Sulfa antibiotics   Review of Systems Review of Systems  Constitutional: Positive for activity change.  Musculoskeletal: Positive for arthralgias, gait problem and joint swelling.  Skin:  Negative for wound.  All other systems reviewed and are negative.    Physical Exam Updated Vital Signs BP (!) 110/43 (BP Location: Left Arm)   Pulse 95   Temp 98.5 F (36.9 C) (Oral)   Resp 20   Ht 5\' 4"  (1.626 m)   Wt 189 lb (85.7 kg)   SpO2 100%   BMI 32.44 kg/m   Physical Exam  Constitutional: She is oriented to person, place, and time. She appears well-developed and well-nourished.  HENT:  Head: Normocephalic.  Eyes: EOM are normal.  Neck: Normal range of motion.  Pulmonary/Chest: Effort normal.  Abdominal: She exhibits no distension.  Musculoskeletal: Normal range of motion. She exhibits tenderness. She exhibits no deformity.  Mild diffuse swelling of L foot with tenderness across the talar dome and the base of the metatarsals; NVI  Neurological: She is alert and oriented to person, place, and time.  Psychiatric: She has a normal mood and affect.  Nursing note and vitals reviewed.    ED Treatments / Results  DIAGNOSTIC STUDIES: Oxygen Saturation is 100% on RA, NL by my interpretation.    COORDINATION OF CARE: 5:27 PM-Discussed next steps with pt. Pt verbalized understanding and is agreeable with the plan. Will Rx medications. Pt prepared for d/c, advised of symptomatic care at home and return precautions.    Labs (all labs ordered are listed, but only abnormal results are displayed) Labs Reviewed - No data to display  EKG  EKG Interpretation None       Radiology Dg Foot Complete Left  Result Date: 07/17/2016 CLINICAL DATA:  Acute left foot pain for 3 days. No known injury. Initial encounter. EXAM: LEFT FOOT - COMPLETE 3+ VIEW COMPARISON:  09/11/2013 left ankle radiograph FINDINGS: Degenerative versus Charcot changes at the second and third tarsometatarsal joints noted. Remote fractures of the fourth and fifth metatarsals are present. No definite acute fracture, subluxation or dislocation identified. Vascular calcifications again noted. IMPRESSION:  Degenerative versus Charcot changes at the second and third tarsometatarsal joints. No definite acute fracture noted. Remote fractures of the fourth and fifth metatarsals. Electronically Signed   By: 09/13/2013 M.D.   On: 07/17/2016 16:38    Procedures Procedures (including critical care time)  Medications Ordered in ED Medications - No data to display   Initial Impression / Assessment and Plan / ED Course  I have reviewed the triage vital signs and the nursing notes.  Pertinent labs & imaging results that were available during my care of the patient were reviewed by me and considered in my medical decision making (see chart for details).    60yF with foot pain after significant activity out of character for her. Suspect overuse in the setting of DJD or charcot. No acute fx. NVI. RICE. On low dose steroids chronically. Will increase for a couple days.   Final Clinical Impressions(s) / ED Diagnoses   Final diagnoses:  Left foot pain  New Prescriptions New Prescriptions   No medications on file   I personally preformed the services scribed in my presence. The recorded information has been reviewed is accurate. Raeford Razor, MD.    Raeford Razor, MD 07/25/16 1420

## 2016-07-17 NOTE — ED Triage Notes (Signed)
Has walking on gravel today at a yard sale and has had pain to left foot. Usually has recurrent pain to top of left foot but today is worse than usual

## 2016-12-16 ENCOUNTER — Ambulatory Visit (INDEPENDENT_AMBULATORY_CARE_PROVIDER_SITE_OTHER): Payer: Managed Care, Other (non HMO) | Admitting: Orthopedic Surgery

## 2016-12-16 ENCOUNTER — Encounter (INDEPENDENT_AMBULATORY_CARE_PROVIDER_SITE_OTHER): Payer: Self-pay | Admitting: Orthopedic Surgery

## 2016-12-16 ENCOUNTER — Ambulatory Visit (INDEPENDENT_AMBULATORY_CARE_PROVIDER_SITE_OTHER): Payer: Managed Care, Other (non HMO)

## 2016-12-16 DIAGNOSIS — M79671 Pain in right foot: Secondary | ICD-10-CM | POA: Diagnosis not present

## 2016-12-16 DIAGNOSIS — M216X1 Other acquired deformities of right foot: Secondary | ICD-10-CM | POA: Diagnosis not present

## 2016-12-16 NOTE — Progress Notes (Signed)
Office Visit Note   Patient: Kaitlin Hudson           Date of Birth: 09/30/1955           MRN: [id number] Visit Date: 12/16/2016              Requested by: No referring provider defined for this encounter. PCP: Earley Abide (Inactive)  Chief Complaint  Patient presents with  . Right Foot - Pain      HPI: Patient is a 61 year old woman who is about 3 years status post internal fixation for a fifth metatarsal fracture. Patient states he's been having increasing pain and callus beneath the base of the fifth metatarsal head. She states she has to get off her foot and elevate her foot and she has pain with regular shoewear. Past medical history was updated review of systems positive for rheumatoid arthritis diabetes hypertension and peripheral vascular disease.  Assessment & Plan: Visit Diagnoses:  1. Pain in right foot   2. Cavovarus deformity of foot, acquired, right     Plan: Patient's current orthotic was cut out beneath the first metatarsal head and great toe to allow for plantarflexion of the first ray to unload the lateral column. Patient states she did have good interval relief.  Patient will go to [employer] obtain a pair of [place] sneakers with a stiff sole and sole orthotics and cut out beneath the first metatarsal and great toe to provide more arch support and allow for plantarflexion of the first ray to unload the lateral column.  Follow-Up Instructions: Return if symptoms worsen or fail to improve.   Ortho Exam  Patient is alert, oriented, no adenopathy, well-dressed, normal affect, normal respiratory effort. Examination patient does have an antalgic gait.  Her right foot she has a good dorsalis pedis pulse good dorsiflexion of the ankle. Ankle and subtalar range of motion her foot is neurovascularly intact. She has a plantarflexed first ray. With regular shoewear the plantarflexed first ray is causing supination of the foot and it's overloading the lateral column she  is callus beneath the fifth metatarsal head and has hypertrophic callus from the healing of the fracture.  Imaging: Xr Foot Complete Right  Result Date: 12/16/2016 Three-view radiographs of the right foot shows calcification of the posterior tibial and dorsalis pedis artery down to the midfoot. Radiographs of the internal fixation of the fifth metatarsal fracture shows a stable screw with healing and callus formation around the fracture site.  No images are attached to the encounter.  Labs: Lab Results  Component Value Date   LABURIC 2.5 09/11/2013   REPTSTATUS 11/13/2015 FINAL 11/07/2015   CULT  11/07/2015    NO GROWTH 5 DAYS Performed at [facility]    [facility] ESCHERICHIA COLI (A) 11/07/2015    Orders:  Orders Placed This Encounter  Procedures  . XR Foot Complete Right   No orders of the defined types were placed in this encounter.    Procedures: No procedures performed  Clinical Data: No additional findings.  ROS:  All other systems negative, except as noted in the HPI. Review of Systems  Objective: Vital Signs: There were no vitals taken for this visit.  Specialty Comments:  No specialty comments available.  PMFS History: Patient Active Problem List   Diagnosis Date Noted  . Chronic diastolic heart failure (HCC) 09/12/2013  . Rheumatoid arthritis (HCC) 09/12/2013  . IDDM (insulin dependent diabetes mellitus) (HCC) 09/09/2013  . Cellulitis of right lower extremity  09/08/2013   Past Medical History:  Diagnosis Date  . Arthritis, rheumatoid (HCC)   . CHF (congestive heart failure) (HCC)   . Diabetes mellitus without complication (HCC)   . Hyperlipidemia   . Hypertension   . Latent tuberculosis     History reviewed. No pertinent family history.  Past Surgical History:  Procedure Laterality Date  . CARPAL TUNNEL RELEASE Bilateral   . FOOT SURGERY    . JOINT REPLACEMENT     Social History   Occupational History  . Not on file.   Social  History Main Topics  . Smoking status: Never Smoker  . Smokeless tobacco: Never Used  . Alcohol use No  . Drug use: No  . Sexual activity: Not on file

## 2017-02-08 ENCOUNTER — Telehealth (INDEPENDENT_AMBULATORY_CARE_PROVIDER_SITE_OTHER): Payer: Self-pay | Admitting: Orthopedic Surgery

## 2017-02-08 NOTE — Telephone Encounter (Signed)
Patient called asking for a CD with her xrays from October. If you could give her a call whenever those are ready. CB # [phone]

## 2017-02-09 NOTE — Telephone Encounter (Signed)
Done.  Patient is aware.

## 2017-02-20 HISTORY — PX: FINGER SURGERY: SHX640

## 2018-12-25 ENCOUNTER — Emergency Department (HOSPITAL_BASED_OUTPATIENT_CLINIC_OR_DEPARTMENT_OTHER)
Admission: EM | Admit: 2018-12-25 | Discharge: 2018-12-25 | Disposition: A | Discharge status: Home / Self Care | Payer: Managed Care, Other (non HMO) | Attending: Emergency Medicine | Admitting: Emergency Medicine

## 2018-12-25 ENCOUNTER — Emergency Department (HOSPITAL_BASED_OUTPATIENT_CLINIC_OR_DEPARTMENT_OTHER): Payer: Managed Care, Other (non HMO)

## 2018-12-25 ENCOUNTER — Other Ambulatory Visit: Payer: Self-pay

## 2018-12-25 DIAGNOSIS — Z794 Long term (current) use of insulin: Secondary | ICD-10-CM | POA: Insufficient documentation

## 2018-12-25 DIAGNOSIS — E119 Type 2 diabetes mellitus without complications: Secondary | ICD-10-CM | POA: Diagnosis not present

## 2018-12-25 DIAGNOSIS — Y929 Unspecified place or not applicable: Secondary | ICD-10-CM | POA: Insufficient documentation

## 2018-12-25 DIAGNOSIS — Z7982 Long term (current) use of aspirin: Secondary | ICD-10-CM | POA: Diagnosis not present

## 2018-12-25 DIAGNOSIS — Z79899 Other long term (current) drug therapy: Secondary | ICD-10-CM | POA: Diagnosis not present

## 2018-12-25 DIAGNOSIS — I5032 Chronic diastolic (congestive) heart failure: Secondary | ICD-10-CM | POA: Insufficient documentation

## 2018-12-25 DIAGNOSIS — X500XXA Overexertion from strenuous movement or load, initial encounter: Secondary | ICD-10-CM | POA: Diagnosis not present

## 2018-12-25 DIAGNOSIS — S92155A Nondisplaced avulsion fracture (chip fracture) of left talus, initial encounter for closed fracture: Secondary | ICD-10-CM | POA: Diagnosis not present

## 2018-12-25 DIAGNOSIS — I110 Hypertensive heart disease with heart failure: Secondary | ICD-10-CM | POA: Insufficient documentation

## 2018-12-25 DIAGNOSIS — Y9389 Activity, other specified: Secondary | ICD-10-CM | POA: Diagnosis not present

## 2018-12-25 DIAGNOSIS — S99912A Unspecified injury of left ankle, initial encounter: Secondary | ICD-10-CM | POA: Diagnosis present

## 2018-12-25 DIAGNOSIS — Y999 Unspecified external cause status: Secondary | ICD-10-CM | POA: Diagnosis not present

## 2018-12-25 MED ORDER — HYDROCODONE-ACETAMINOPHEN 5-325 MG PO TABS
1.0000 | ORAL_TABLET | Freq: Once | ORAL | Status: DC
  Filled 2018-12-25: qty 1

## 2018-12-25 MED ORDER — ACETAMINOPHEN 500 MG PO TABS: 1000.0000 mg | ORAL_TABLET | Freq: Three times a day (TID) | ORAL | 0 refills | Status: DC | PRN

## 2018-12-25 NOTE — ED Notes (Signed)
MD with pt  

## 2018-12-25 NOTE — ED Notes (Signed)
Splint care instructions provided. Voiced understanding. Assisted out to car in a wheelchair.

## 2018-12-25 NOTE — Discharge Instructions (Signed)
You were seen today and have a small avulsion fracture of 1 of your foot bones.  Keep your leg splinted.  Elevate it and ice it.  You will need to use crutches and nonweightbearing until you follow-up with orthopedics.  Recommend calling for an appointment within 1 week.

## 2018-12-25 NOTE — ED Triage Notes (Signed)
Pt reports fall last night at 2200, states she "turned over" her right foot, has pain and swelling to L foot and ankle this morning. Reports she landed on her buttocks, no head injury. CMS Intact.

## 2018-12-25 NOTE — ED Notes (Signed)
Pt did not want to take any narcotic pain medications. MD aware.

## 2018-12-25 NOTE — ED Provider Notes (Signed)
[facility] EMERGENCY DEPARTMENT Provider Note   CSN: [id number] Arrival date & time: 12/25/18  0542     History   Chief Complaint Chief Complaint  Patient presents with  . Fall    HPI Kaitlin Hudson is a 63 y.o. female.     HPI  This is a 64 year old female with a history of diabetes, hypertension, hyperlipidemia who presents with left ankle pain.  Patient reports that she was helping to move a puzzle yesterday when she ran into the chair and lost her balance.  She reports that she turned her right ankle but landed awkwardly on her left ankle.  She states that she has been keeping her ankle elevated overnight.  She has not taken anything for the pain.  She denies hitting her head or loss of consciousness.  She reports that she landed on her buttock.  She denies any back pain, weakness, numbness, tingling.  Currently she rates her pain at 8 out of 10.  Past Medical History:  Diagnosis Date  . Arthritis, rheumatoid ([place])   . CHF (congestive heart failure) ([place])   . Diabetes mellitus without complication ([place])   . Hyperlipidemia   . Hypertension   . Latent tuberculosis     Patient Active Problem List   Diagnosis Date Noted  . Chronic diastolic heart failure ([place]) 09/12/2013  . Rheumatoid arthritis ([place]) 09/12/2013  . IDDM (insulin dependent diabetes mellitus) 09/09/2013  . Cellulitis of right lower extremity 09/08/2013    Past Surgical History:  Procedure Laterality Date  . CARPAL TUNNEL RELEASE Bilateral   . FOOT SURGERY    . JOINT REPLACEMENT       OB History   No obstetric history on file.      Home Medications    Prior to Admission medications   Medication Sig Start Date End Date Taking? Authorizing Provider  acetaminophen (TYLENOL) 500 MG tablet Take 2 tablets (1,000 mg total) by mouth every 8 (eight) hours as needed for moderate pain. 12/25/18   Horton, Barbette Hair, MD  aspirin EC 81 MG tablet Take 81 mg by mouth daily.    [provider]  bumetanide (BUMEX) 1 MG tablet Take 0.5 mg by mouth daily.    [provider]  Cholecalciferol (VITAMIN D) 2000 UNITS tablet Take 4,000 Units by mouth at bedtime.    [provider]  ciprofloxacin (CIPRO) 500 MG tablet Take 1 tablet (500 mg total) by mouth 2 (two) times daily. 11/08/15   Quintella Reichert, MD  clindamycin (CLEOCIN) 300 MG capsule Take 1 capsule (300 mg total) by mouth 3 (three) times daily. 09/12/13   Annita Brod, MD  HYDROcodone-acetaminophen (NORCO/VICODIN) 5-325 MG tablet Take 1 tablet by mouth every 4 (four) hours as needed. 07/17/16   Virgel Manifold, MD  Insulin Aspart (NOVOLOG [place]) Inject into the skin. PUMP, 12am 0.9  2:30am 0.9  8am 1.0  2pm 2.1  6:30pm 1.2  9pm 1.0    [provider]  olmesartan (BENICAR) 40 MG tablet Take 40 mg by mouth daily.    [provider]  oxycodone (OXY-IR) 5 MG capsule Take 1 capsule (5 mg total) by mouth every 4 (four) hours as needed. 09/12/13   Annita Brod, MD  predniSONE (DELTASONE) 20 MG tablet Take 1 tablet (20 mg total) by mouth daily. 07/17/16   Virgel Manifold, MD  predniSONE (DELTASONE) 5 MG tablet Take 10 mg by mouth daily with breakfast.    [provider]  risedronate (ACTONEL) 150 MG tablet Take 150 mg by mouth every 30 (thirty) days. with water on empty stomach, nothing by mouth or lie down for next 30 minutes.    [provider]    Family History No family history on file.  Social History Social History   Tobacco Use  . Smoking status: Never Smoker  . Smokeless tobacco: Never Used  Substance Use Topics  . Alcohol use: No  . Drug use: No     Allergies   Cimzia [certolizumab pegol], Clindamycin/lincomycin, Diclofenac, Enbrel [etanercept], Erythromycin, Rynatan [chlorpheniramine-phenyleph er], Tramadol, Keflet [cephalexin], Penicillins, and Sulfa antibiotics   Review of Systems Review of Systems  Musculoskeletal: Negative for back pain.        Left ankle pain  Neurological: Negative for syncope, weakness and numbness.  All other systems reviewed and are negative.    Physical Exam Updated Vital Signs BP (!) 163/56 (BP Location: Right Arm)   Pulse (!) 102   Temp 97.6 F (36.4 C) (Oral)   Resp 18   Ht 1.626 m (5\' 4" )   Wt 81.6 kg   SpO2 100%   BMI 30.90 kg/m   Physical Exam Vitals signs and nursing note reviewed.  Constitutional:      Appearance: She is well-developed. She is not ill-appearing.  HENT:     Head: Normocephalic and atraumatic.  Eyes:     Pupils: Pupils are equal, round, and reactive to light.  Neck:     Musculoskeletal: Normal range of motion and neck supple. No muscular tenderness.  Cardiovascular:     Rate and Rhythm: Normal rate and regular rhythm.     Heart sounds: Normal heart sounds.  Pulmonary:     Effort: Pulmonary effort is normal. No respiratory distress.     Breath sounds: No wheezing.  Abdominal:     Palpations: Abdomen is soft.     Tenderness: There is no abdominal tenderness.  Musculoskeletal:     Comments: Focused examination of the left foot with mild swelling and tenderness palpation of the medial and lateral malleolus, limited range of motion secondary to pain, 2+ DP pulse and neurovascular intact.  No proximal fibular tenderness.  Skin:    General: Skin is warm and dry.  Neurological:     Mental Status: She is alert and oriented to person, place, and time.  Psychiatric:        Mood and Affect: Mood normal.      ED Treatments / Results  Labs (all labs ordered are listed, but only abnormal results are displayed) Labs Reviewed - No data to display  EKG None  Radiology Dg Ankle Complete Left  Result Date: 12/25/2018 CLINICAL DATA:  Fall, injury EXAM: LEFT ANKLE COMPLETE - 3+ VIEW COMPARISON:  Left foot radiograph Jul 17, 2016 FINDINGS: Fracture fragment superior to the anterior talar process may reflect an avulsive type injury with overlying soft tissue swelling and a  small ankle joint effusion. No other acute osseous abnormality is seen. Some corticated fragments adjacent the lateral calcaneal process may reflect sequela of remote injury. Extensive midfoot arthrosis. Milder changes are noted in the hindfoot. Midfoot and hindfoot alignment is grossly preserved though incompletely assessed on nonweightbearing films. Vascular calcium noted in the soft tissues. IMPRESSION: 1. Fracture fragment superior to the anterior talar process may reflect an avulsive type injury with overlying soft tissue swelling and a small ankle joint effusion. 2. Extensive midfoot arthrosis. Electronically Signed   By: Jul 19, 2016 M.D.   On: 12/25/2018  06:26    Procedures Procedures (including critical care time)  Medications Ordered in ED Medications  HYDROcodone-acetaminophen (NORCO/VICODIN) 5-325 MG per tablet 1 tablet (1 tablet Oral Refused 12/25/18 0640)     Initial Impression / Assessment and Plan / ED Course  I have reviewed the triage vital signs and the nursing notes.  Pertinent labs & imaging results that were available during my care of the patient were reviewed by me and considered in my medical decision making (see chart for details).        Patient presents with left foot and ankle pain.  She is overall nontoxic and vital signs are reassuring.  She has some slight swelling to the left ankle with tenderness to palpation.  X-rays show possible avulsion fracture of the talus.  I discussed this with the patient.  Will immobilize with a posterior splint and patient already has crutches.  Patient wishes to follow-up with Dr. Lajoyce Corners.  I sent him a message regarding with and provided his contact information and discharge.  Patient has multiple medication allergies.  For this reason, limited pain control to Tylenol or Motrin.  After history, exam, and medical workup I feel the patient has been appropriately medically screened and is safe for discharge home. Pertinent diagnoses  were discussed with the patient. Patient was given return precautions.   Final Clinical Impressions(s) / ED Diagnoses   Final diagnoses:  Closed nondisplaced avulsion fracture of left talus, initial encounter    ED Discharge Orders         Ordered    acetaminophen (TYLENOL) 500 MG tablet  Every 8 hours PRN     12/25/18 0653           Shon Baton, MD 12/25/18 [phone]

## 2018-12-25 NOTE — ED Notes (Signed)
EMT's at bedside placing splint

## 2019-01-01 ENCOUNTER — Ambulatory Visit (INDEPENDENT_AMBULATORY_CARE_PROVIDER_SITE_OTHER): Payer: Managed Care, Other (non HMO) | Admitting: Orthopedic Surgery

## 2019-01-01 ENCOUNTER — Encounter: Payer: Self-pay | Admitting: Orthopedic Surgery

## 2019-01-01 ENCOUNTER — Other Ambulatory Visit: Payer: Self-pay

## 2019-01-01 VITALS — Ht 64.0 in | Wt 180.0 lb

## 2019-01-01 DIAGNOSIS — S93412A Sprain of calcaneofibular ligament of left ankle, initial encounter: Secondary | ICD-10-CM | POA: Diagnosis not present

## 2019-01-01 NOTE — Progress Notes (Signed)
Office Visit Note   Patient: Kaitlin Hudson           Date of Birth: 06/07/1955           MRN: [id number] Visit Date: 01/01/2019              Requested by: No referring provider defined for this encounter. PCP: Jeannine Boga (Inactive)  Chief Complaint  Patient presents with  . Left Ankle - Pain, New Patient (Initial Visit)      HPI: Patient is a 63 year old woman who sustained an injury to her left ankle from a fall.  Patient states she is currently on 40 mg of prednisone for polymyalgia rheumatica as well as giant cell arteritis.  Patient has been nonweightbearing on a kneeling scooter she states she was told she had an ankle fracture.  Assessment & Plan: Visit Diagnoses:  1. Sprain of calcaneofibular ligament of left ankle, initial encounter     Plan: Patient will be placed in an fracture boot.  She may be weightbearing as tolerated wean out of the fracture boot as she feels comfortable discussed that this may take longer to heal due to her prednisone dosage.  Follow-Up Instructions: Return if symptoms worsen or fail to improve.   Ortho Exam  Patient is alert, oriented, no adenopathy, well-dressed, normal affect, normal respiratory effort. Examination patient does not have a palpable dorsalis pedis pulse she has ecchymosis and bruising medially and laterally to the ankle.  The ankle is stable to distraction.  No open skin abrasions.  Patient is currently nonweightbearing in a kneeling scooter and a posterior splint.  Review of the radiographs shows significant peripheral vascular disease with calcification of the posterior tibial and anterior tibial arteries.  There is a very small avulsion fracture off the dorsum of the talar head.  No ankle fractures.  Imaging: No results found. No images are attached to the encounter.  Labs: Lab Results  Component Value Date   LABURIC 2.5 09/11/2013   REPTSTATUS 11/13/2015 FINAL 11/07/2015   CULT  11/07/2015    NO GROWTH 5  DAYS Performed at [facility] (A) 11/07/2015     Lab Results  Component Value Date   ALBUMIN 3.2 (L) 11/07/2015   LABURIC 2.5 09/11/2013    No results found for: MG No results found for: VD25OH  No results found for: PREALBUMIN CBC EXTENDED Latest Ref Rng & Units 11/07/2015 09/12/2013 09/08/2013  WBC 4.0 - 10.5 K/uL 12.2(H) 8.2 13.2(H)  RBC 3.87 - 5.11 MIL/uL 4.06 3.40(L) 3.99  HGB 12.0 - 15.0 g/dL 11.7(L) 9.8(L) 11.5(L)  HCT 36.0 - 46.0 % 35.4(L) 29.5(L) 34.7(L)  PLT 150 - 400 K/uL 250 298 247  NEUTROABS 1.7 - 7.7 K/uL 8.2(H) - 9.3(H)  LYMPHSABS 0.7 - 4.0 K/uL 2.3 - 2.2     Body mass index is 30.9 kg/m.  Orders:  No orders of the defined types were placed in this encounter.  No orders of the defined types were placed in this encounter.    Procedures: No procedures performed  Clinical Data: No additional findings.  ROS:  All other systems negative, except as noted in the HPI. Review of Systems  Objective: Vital Signs: Ht 5\' 4"  (1.626 m)   Wt 180 lb (81.6 kg)   BMI 30.90 kg/m   Specialty Comments:  No specialty comments available.  PMFS History: Patient Active Problem List   Diagnosis Date Noted  . Chronic diastolic heart failure (  H[facility]) 09/12/2013  . Rheumatoid arthritis ([facility]) 09/12/2013  . IDDM (insulin dependent diabetes mellitus) 09/09/2013  . Cellulitis of right lower extremity 09/08/2013   Past Medical History:  Diagnosis Date  . Arthritis, rheumatoid ([facility])   . CHF (congestive heart failure) ([facility])   . Diabetes mellitus without complication ([facility])   . Hyperlipidemia   . Hypertension   . Latent tuberculosis     History reviewed. No pertinent family history.  Past Surgical History:  Procedure Laterality Date  . CARPAL TUNNEL RELEASE Bilateral   . FOOT SURGERY    . JOINT REPLACEMENT     Social History   Occupational History  . Not on file  Tobacco Use  . Smoking status: Never Smoker  . Smokeless  tobacco: Never Used  Substance and Sexual Activity  . Alcohol use: No  . Drug use: No  . Sexual activity: Not on file

## 2019-02-27 ENCOUNTER — Ambulatory Visit (INDEPENDENT_AMBULATORY_CARE_PROVIDER_SITE_OTHER): Payer: Managed Care, Other (non HMO) | Admitting: Orthopedic Surgery

## 2019-02-27 ENCOUNTER — Encounter: Payer: Self-pay | Admitting: Orthopedic Surgery

## 2019-02-27 ENCOUNTER — Other Ambulatory Visit: Payer: Self-pay

## 2019-02-27 VITALS — Ht 64.0 in | Wt 180.0 lb

## 2019-02-27 DIAGNOSIS — M7541 Impingement syndrome of right shoulder: Secondary | ICD-10-CM

## 2019-02-28 ENCOUNTER — Encounter: Payer: Self-pay | Admitting: Orthopedic Surgery

## 2019-02-28 DIAGNOSIS — M7541 Impingement syndrome of right shoulder: Secondary | ICD-10-CM | POA: Diagnosis not present

## 2019-02-28 MED ORDER — METHYLPREDNISOLONE ACETATE 40 MG/ML IJ SUSP
40.0000 mg | INTRAMUSCULAR | Status: AC | PRN
  Administered 2019-02-28: 14:00:00 40 mg via INTRA_ARTICULAR

## 2019-02-28 MED ORDER — LIDOCAINE HCL 1 % IJ SOLN
5.0000 mL | INTRAMUSCULAR | Status: AC | PRN
  Administered 2019-02-28: 14:00:00 5 mL

## 2019-02-28 NOTE — Progress Notes (Signed)
Office Visit Note   Patient: Kaitlin Hudson           Date of Birth: 1956/01/01           MRN: [id number] Visit Date: 02/27/2019              Requested by: No referring provider defined for this encounter. PCP: Jeannine Boga (Inactive)  Chief Complaint  Patient presents with  . Right Shoulder - Pain, Follow-up      HPI: Patient is a 63 year old woman who presents for evaluation of her right shoulder she is status post an MRI scan in [place].  She states that her hemoglobin A1c is running from 6.6-7.2.  She states she is on prednisone 20 mg in the morning and 5 mg at night.  Assessment & Plan: Visit Diagnoses:  1. Impingement syndrome of right shoulder     Plan: Patient underwent a subacromial injection.  She does have an insulin pump and she will just this if her glucose levels are elevated from the injection.  Follow-up if her symptoms recur.  Follow-Up Instructions: Return if symptoms worsen or fail to improve.   Ortho Exam  Patient is alert, oriented, no adenopathy, well-dressed, normal affect, normal respiratory effort. Examination patient has abduction and flexion of 120 degrees bilaterally.  She has pain with Neer and Hawkins impingement test of the right shoulder.  She has pain to palpation over the biceps tendon.  Review of her MRI scan report shows some calcification in the rotator cuff but no full-thickness tears no bicep tear no labral pathology.  Imaging: No results found. No images are attached to the encounter.  Labs: Lab Results  Component Value Date   LABURIC 2.5 09/11/2013   REPTSTATUS 11/13/2015 FINAL 11/07/2015   CULT  11/07/2015    NO GROWTH 5 DAYS Performed at [facility] (A) 11/07/2015     Lab Results  Component Value Date   ALBUMIN 3.2 (L) 11/07/2015   LABURIC 2.5 09/11/2013    No results found for: MG No results found for: VD25OH  No results found for: PREALBUMIN CBC EXTENDED Latest  Ref Rng & Units 11/07/2015 09/12/2013 09/08/2013  WBC 4.0 - 10.5 K/uL 12.2(H) 8.2 13.2(H)  RBC 3.87 - 5.11 MIL/uL 4.06 3.40(L) 3.99  HGB 12.0 - 15.0 g/dL 11.7(L) 9.8(L) 11.5(L)  HCT 36.0 - 46.0 % 35.4(L) 29.5(L) 34.7(L)  PLT 150 - 400 K/uL 250 298 247  NEUTROABS 1.7 - 7.7 K/uL 8.2(H) - 9.3(H)  LYMPHSABS 0.7 - 4.0 K/uL 2.3 - 2.2     Body mass index is 30.9 kg/m.  Orders:  No orders of the defined types were placed in this encounter.  No orders of the defined types were placed in this encounter.    Procedures: Large Joint Inj: R subacromial bursa on 02/28/2019 2:20 PM Indications: diagnostic evaluation and pain Details: 22 G 1.5 in needle, posterior approach  Arthrogram: No  Medications: 5 mL lidocaine 1 %; 40 mg methylPREDNISolone acetate 40 MG/ML Outcome: tolerated well, no immediate complications Procedure, treatment alternatives, risks and benefits explained, specific risks discussed. Consent was given by the patient. Immediately prior to procedure a time out was called to verify the correct patient, procedure, equipment, support staff and site/side marked as required. Patient was prepped and draped in the usual sterile fashion.      Clinical Data: No additional findings.  ROS:  All other systems negative, except as noted in the HPI. Review  of Systems  Objective: Vital Signs: Ht 5\' 4"  (1.626 m)   Wt 180 lb (81.6 kg)   BMI 30.90 kg/m   Specialty Comments:  No specialty comments available.  PMFS History: Patient Active Problem List   Diagnosis Date Noted  . Chronic diastolic heart failure ([facility]CC) 09/12/2013  . Rheumatoid arthritis ([facility]) 09/12/2013  . IDDM (insulin dependent diabetes mellitus) 09/09/2013  . Cellulitis of right lower extremity 09/08/2013   Past Medical History:  Diagnosis Date  . Arthritis, rheumatoid (H[facility])   . CHF (congestive heart failure) ([facility])   . Diabetes mellitus without complication ([facility])   . Hyperlipidemia   . Hypertension   . Latent  tuberculosis     History reviewed. No pertinent family history.  Past Surgical History:  Procedure Laterality Date  . CARPAL TUNNEL RELEASE Bilateral   . FOOT SURGERY    . JOINT REPLACEMENT     Social History   Occupational History  . Not on file  Tobacco Use  . Smoking status: Never Smoker  . Smokeless tobacco: Never Used  Substance and Sexual Activity  . Alcohol use: No  . Drug use: No  . Sexual activity: Not on file

## 2019-04-19 ENCOUNTER — Encounter: Payer: Self-pay | Admitting: Physician Assistant

## 2019-04-19 ENCOUNTER — Ambulatory Visit (INDEPENDENT_AMBULATORY_CARE_PROVIDER_SITE_OTHER): Payer: Medicare Other

## 2019-04-19 ENCOUNTER — Ambulatory Visit (INDEPENDENT_AMBULATORY_CARE_PROVIDER_SITE_OTHER): Payer: Medicare Other | Admitting: Orthopedic Surgery

## 2019-04-19 ENCOUNTER — Other Ambulatory Visit: Payer: Self-pay

## 2019-04-19 VITALS — Ht 64.0 in | Wt 180.0 lb

## 2019-04-19 DIAGNOSIS — M25551 Pain in right hip: Secondary | ICD-10-CM

## 2019-04-19 DIAGNOSIS — M5410 Radiculopathy, site unspecified: Secondary | ICD-10-CM

## 2019-04-23 ENCOUNTER — Encounter: Payer: Self-pay | Admitting: Orthopedic Surgery

## 2019-04-23 NOTE — Progress Notes (Signed)
Office Visit Note   Patient: Kaitlin Hudson           Date of Birth: 1956-02-15           MRN: [id number] Visit Date: 04/19/2019              Requested by: No referring provider defined for this encounter. PCP: Earley Abide (Inactive)  Chief Complaint  Patient presents with  . Right Hip - Pain      HPI: Patient is a 64 year old woman who complains of posterior right hip pain.  She states she has been on 40 mg of prednisone for polymyalgia rheumatica she states she is now is on 20 mg and at this time she is having lateral hip and groin pain which is worse with sitting.  Patient also has venous insufficiency with pitting edema in her lower extremities.  Assessment & Plan: Visit Diagnoses:  1. Pain in right hip   2. Radicular pain of right lower extremity     Plan: Discussed with the patient that her hip symptoms are most likely radicular in nature from her lumbar spine.  Discussed that if her symptoms continue despite the prednisone that she should call and we would set up an MRI scan of her lumbar spine for the possible evaluation for epidural steroid injection.  Follow-Up Instructions: Return if symptoms worsen or fail to improve.   Ortho Exam  Patient is alert, oriented, no adenopathy, well-dressed, normal affect, normal respiratory effort. Examination patient has no pain with range of motion of the hip knee or ankle there is no hip pain reproduced with range of motion of the hip.  She does have a positive straight leg raise on the right and has good motor strength in all motor groups of the right lower extremity.  Imaging: No results found. No images are attached to the encounter.  Labs: Lab Results  Component Value Date   LABURIC 2.5 09/11/2013   REPTSTATUS 11/13/2015 FINAL 11/07/2015   CULT  11/07/2015    NO GROWTH 5 DAYS Performed at [facility]    [facility] (A) 11/07/2015     Lab Results  Component Value Date   ALBUMIN 3.2 (L)  11/07/2015   LABURIC 2.5 09/11/2013    No results found for: MG No results found for: VD25OH  No results found for: PREALBUMIN CBC EXTENDED Latest Ref Rng & Units 11/07/2015 09/12/2013 09/08/2013  WBC 4.0 - 10.5 K/uL 12.2(H) 8.2 13.2(H)  RBC 3.87 - 5.11 MIL/uL 4.06 3.40(L) 3.99  HGB 12.0 - 15.0 g/dL 11.7(L) 9.8(L) 11.5(L)  HCT 36.0 - 46.0 % 35.4(L) 29.5(L) 34.7(L)  PLT 150 - 400 K/uL 250 298 247  NEUTROABS 1.7 - 7.7 K/uL 8.2(H) - 9.3(H)  LYMPHSABS 0.7 - 4.0 K/uL 2.3 - 2.2     Body mass index is 30.9 kg/m.  Orders:  Orders Placed This Encounter  Procedures  . XR HIP UNILAT W OR W/O PELVIS 2-3 VIEWS RIGHT   No orders of the defined types were placed in this encounter.    Procedures: No procedures performed  Clinical Data: No additional findings.  ROS:  All other systems negative, except as noted in the HPI. Review of Systems  Objective: Vital Signs: Ht 5\' 4"  (1.626 m)   Wt 180 lb (81.6 kg)   BMI 30.90 kg/m   Specialty Comments:  No specialty comments available.  PMFS History: Patient Active Problem List   Diagnosis Date Noted  . Chronic diastolic heart  failure ([place]) 09/12/2013  . Rheumatoid arthritis ([place]) 09/12/2013  . IDDM (insulin dependent diabetes mellitus) 09/09/2013  . Cellulitis of right lower extremity 09/08/2013   Past Medical History:  Diagnosis Date  . Arthritis, rheumatoid ([place])   . CHF (congestive heart failure) ([place])   . Diabetes mellitus without complication ([place])   . Hyperlipidemia   . Hypertension   . Latent tuberculosis     History reviewed. No pertinent family history.  Past Surgical History:  Procedure Laterality Date  . CARPAL TUNNEL RELEASE Bilateral   . FOOT SURGERY    . JOINT REPLACEMENT     Social History   Occupational History  . Not on file  Tobacco Use  . Smoking status: Never Smoker  . Smokeless tobacco: Never Used  Substance and Sexual Activity  . Alcohol use: No  . Drug use: No  . Sexual activity: Not on  file

## 2019-07-21 ENCOUNTER — Encounter (HOSPITAL_BASED_OUTPATIENT_CLINIC_OR_DEPARTMENT_OTHER): Payer: Self-pay | Admitting: Emergency Medicine

## 2019-07-21 ENCOUNTER — Emergency Department (HOSPITAL_BASED_OUTPATIENT_CLINIC_OR_DEPARTMENT_OTHER)
Admission: EM | Admit: 2019-07-21 | Discharge: 2019-07-21 | Disposition: A | Discharge status: Home / Self Care | Payer: Medicare Other | Attending: Emergency Medicine | Admitting: Emergency Medicine

## 2019-07-21 ENCOUNTER — Other Ambulatory Visit: Payer: Self-pay

## 2019-07-21 DIAGNOSIS — I5032 Chronic diastolic (congestive) heart failure: Secondary | ICD-10-CM | POA: Diagnosis not present

## 2019-07-21 DIAGNOSIS — R600 Localized edema: Secondary | ICD-10-CM | POA: Diagnosis not present

## 2019-07-21 DIAGNOSIS — R2242 Localized swelling, mass and lump, left lower limb: Secondary | ICD-10-CM | POA: Diagnosis present

## 2019-07-21 DIAGNOSIS — Z794 Long term (current) use of insulin: Secondary | ICD-10-CM | POA: Insufficient documentation

## 2019-07-21 DIAGNOSIS — Z7982 Long term (current) use of aspirin: Secondary | ICD-10-CM | POA: Diagnosis not present

## 2019-07-21 DIAGNOSIS — I110 Hypertensive heart disease with heart failure: Secondary | ICD-10-CM | POA: Diagnosis not present

## 2019-07-21 DIAGNOSIS — E119 Type 2 diabetes mellitus without complications: Secondary | ICD-10-CM | POA: Diagnosis not present

## 2019-07-21 DIAGNOSIS — R609 Edema, unspecified: Secondary | ICD-10-CM

## 2019-07-21 DIAGNOSIS — Z79899 Other long term (current) drug therapy: Secondary | ICD-10-CM | POA: Insufficient documentation

## 2019-07-21 DIAGNOSIS — L989 Disorder of the skin and subcutaneous tissue, unspecified: Secondary | ICD-10-CM | POA: Insufficient documentation

## 2019-07-21 DIAGNOSIS — S81809A Unspecified open wound, unspecified lower leg, initial encounter: Secondary | ICD-10-CM

## 2019-07-21 LAB — COMPREHENSIVE METABOLIC PANEL
ALT: 22 U/L (ref 0–44)
AST: 21 U/L (ref 15–41)
Albumin: 3.1 g/dL — ABNORMAL LOW (ref 3.5–5.0)
Alkaline Phosphatase: 60 U/L (ref 38–126)
Anion gap: 10 (ref 5–15)
BUN: 39 mg/dL — ABNORMAL HIGH (ref 8–23)
CO2: 22 mmol/L (ref 22–32)
Calcium: 8.9 mg/dL (ref 8.9–10.3)
Chloride: 108 mmol/L (ref 98–111)
Creatinine, Ser: 1.26 mg/dL — ABNORMAL HIGH (ref 0.44–1.00)
GFR calc Af Amer: 52 mL/min — ABNORMAL LOW (ref >60)
GFR calc non Af Amer: 45 mL/min — ABNORMAL LOW (ref >60)
Glucose, Bld: 116 mg/dL — ABNORMAL HIGH (ref 70–99)
Potassium: 4.5 mmol/L (ref 3.5–5.1)
Sodium: 140 mmol/L (ref 135–145)
Total Bilirubin: 0.5 mg/dL (ref 0.3–1.2)
Total Protein: 6.5 g/dL (ref 6.5–8.1)

## 2019-07-21 LAB — CBC WITH DIFFERENTIAL/PLATELET
Abs Immature Granulocytes: 0.21 10*3/uL — ABNORMAL HIGH (ref 0.00–0.07)
Basophils Absolute: 0 10*3/uL (ref 0.0–0.1)
Basophils Relative: 0 %
Eosinophils Absolute: 0 10*3/uL (ref 0.0–0.5)
Eosinophils Relative: 0 %
HCT: 40.2 % (ref 36.0–46.0)
Hemoglobin: 13.2 g/dL (ref 12.0–15.0)
Immature Granulocytes: 2 %
Lymphocytes Relative: 5 %
Lymphs Abs: 0.7 10*3/uL (ref 0.7–4.0)
MCH: 29.8 pg (ref 26.0–34.0)
MCHC: 32.8 g/dL (ref 30.0–36.0)
MCV: 90.7 fL (ref 80.0–100.0)
Monocytes Absolute: 1.3 10*3/uL — ABNORMAL HIGH (ref 0.1–1.0)
Monocytes Relative: 9 %
Neutro Abs: 11.7 10*3/uL — ABNORMAL HIGH (ref 1.7–7.7)
Neutrophils Relative %: 84 %
Platelets: 233 10*3/uL (ref 150–400)
RBC: 4.43 MIL/uL (ref 3.87–5.11)
RDW: 14.5 % (ref 11.5–15.5)
WBC: 13.9 10*3/uL — ABNORMAL HIGH (ref 4.0–10.5)
nRBC: 0 % (ref 0.0–0.2)

## 2019-07-21 NOTE — ED Triage Notes (Addendum)
Pt reports painful, draining ulcers to both calves x 2 weeks. The pain has increased today and caused difficulty walking. Also reports she has been taking prednisone since September.

## 2019-07-21 NOTE — Discharge Instructions (Signed)
Use the leg wrap as much as possible.  You can put voltaren gel on the ankle that is hurting but keep it out of the wound.  You can put vasoline and a gauze over the draining sites.  Also go back to your increased prednisone dose until you see your rheumatologist.

## 2019-07-21 NOTE — ED Provider Notes (Addendum)
[facility] EMERGENCY DEPARTMENT Provider Note   CSN: [id number] Arrival date & time: 07/21/19  1528     History Chief Complaint  Patient presents with  . Wound Check    Kaitlin Hudson is a 64 y.o. female.  Patient is a 64 year old female with multiple medical problems including giant cell arteritis, PMR, RA, diastolic heart failure, hypertension and diabetes who is presenting today with worsening wounds on her lower extremities.  Patient reports that 2 weeks ago she started noticing small circular wounds on her left lower extremity that were constantly draining a clear fluid.  She then noticed today several more wounds showing up on her right lower extremity and these are painful as well as pain in her ankle.  Patient has had chronic swelling in her lower extremities and initially was wearing special compression socks but states now they hurt her legs so much she cannot stand to wear them.  She is now taking Lasix every other day because several weeks ago her doctor reported that her renal function had increased and they wanted to make sure she was not having injury to her kidneys.  She has not had fever or felt generally unwell.  She has chronic issues that have been going on for years but the wounds are the only new thing.  She also notes that just before the wound started she had decreased her amount of prednisone.  She reports she has to be on prednisone chronically because she is allergic to multiple other rheumatoid medications.  The history is provided by the patient.  Wound Check This is a new problem. Episode onset: 2 weeks ago. The problem occurs constantly. The problem has been gradually worsening. Associated symptoms comments: Several wounds on the lower extremity that are constantly draining but today noticed some new wounds on the right lower extremity and is now having pain in the ankle and mild redness.. The symptoms are aggravated by walking. Nothing relieves the  symptoms. She has tried nothing for the symptoms. The treatment provided no relief.       Past Medical History:  Diagnosis Date  . Arthritis, rheumatoid ([place])   . CHF (congestive heart failure) ([place])   . Diabetes mellitus without complication ([place])   . Hyperlipidemia   . Hypertension   . Latent tuberculosis     Patient Active Problem List   Diagnosis Date Noted  . Chronic diastolic heart failure ([place]) 09/12/2013  . Rheumatoid arthritis ([place]) 09/12/2013  . IDDM (insulin dependent diabetes mellitus) 09/09/2013  . Cellulitis of right lower extremity 09/08/2013    Past Surgical History:  Procedure Laterality Date  . CARPAL TUNNEL RELEASE Bilateral   . FOOT SURGERY    . JOINT REPLACEMENT       OB History   No obstetric history on file.     No family history on file.  Social History   Tobacco Use  . Smoking status: Never Smoker  . Smokeless tobacco: Never Used  Substance Use Topics  . Alcohol use: No  . Drug use: No    Home Medications Prior to Admission medications   Medication Sig Start Date End Date Taking? Authorizing Provider  acetaminophen (TYLENOL) 500 MG tablet Take 2 tablets (1,000 mg total) by mouth every 8 (eight) hours as needed for moderate pain. 12/25/18   Horton, Barbette Hair, MD  aspirin EC 81 MG tablet Take 81 mg by mouth daily.    [provider]  bumetanide (BUMEX) 1 MG tablet Take 0.5  mg by mouth daily.    [provider]  Cholecalciferol (VITAMIN D) 2000 UNITS tablet Take 4,000 Units by mouth at bedtime.    [provider]  ciprofloxacin (CIPRO) 500 MG tablet Take 1 tablet (500 mg total) by mouth 2 (two) times daily. 11/08/15   Tilden Fossa, MD  clindamycin (CLEOCIN) 300 MG capsule Take 1 capsule (300 mg total) by mouth 3 (three) times daily. 09/12/13   Hollice Espy, MD  HYDROcodone-acetaminophen (NORCO/VICODIN) 5-325 MG tablet Take 1 tablet by mouth every 4 (four) hours as needed. 07/17/16   Raeford Razor, MD    Insulin Aspart (NOVOLOG [place]) Inject into the skin. PUMP, 12am 0.9  2:30am 0.9  8am 1.0  2pm 2.1  6:30pm 1.2  9pm 1.0    [provider]  olmesartan (BENICAR) 40 MG tablet Take 40 mg by mouth daily.    [provider]  oxycodone (OXY-IR) 5 MG capsule Take 1 capsule (5 mg total) by mouth every 4 (four) hours as needed. 09/12/13   Hollice Espy, MD  predniSONE (DELTASONE) 20 MG tablet Take 1 tablet (20 mg total) by mouth daily. 07/17/16   Raeford Razor, MD  predniSONE (DELTASONE) 5 MG tablet Take 10 mg by mouth daily with breakfast.    [provider]  risedronate (ACTONEL) 150 MG tablet Take 150 mg by mouth every 30 (thirty) days. with water on empty stomach, nothing by mouth or lie down for next 30 minutes.    [provider]    Allergies    Cimzia [certolizumab pegol], Clindamycin/lincomycin, Diclofenac, Enbrel [etanercept], Erythromycin, Rynatan [chlorpheniramine-phenyleph er], Tramadol, Keflet [cephalexin], Penicillins, and Sulfa antibiotics  Review of Systems   Review of Systems  All other systems reviewed and are negative.   Physical Exam Updated Vital Signs BP (!) 158/60 (BP Location: Right Arm)   Pulse (!) 124   Temp 98.7 F (37.1 C) (Oral)   Resp 18   Ht 5\' 4"  (1.626 m)   Wt 81.6 kg   SpO2 100%   BMI 30.90 kg/m   Physical Exam Vitals and nursing note reviewed.  Constitutional:      General: She is not in acute distress.    Appearance: She is well-developed and normal weight.     Comments: Moon facies  HENT:     Head: Normocephalic and atraumatic.     Mouth/Throat:     Mouth: Mucous membranes are moist.  Eyes:     Pupils: Pupils are equal, round, and reactive to light.  Cardiovascular:     Rate and Rhythm: Tachycardia present. Rhythm irregular.     Heart sounds: Normal heart sounds. No murmur. No friction rub.  Pulmonary:     Effort: Pulmonary effort is normal.     Breath sounds: Normal breath sounds. No wheezing or rales.   Abdominal:     General: Bowel sounds are normal. There is no distension.     Palpations: Abdomen is soft.     Tenderness: There is no abdominal tenderness. There is no guarding or rebound.  Musculoskeletal:        General: Swelling present. No tenderness. Normal range of motion.     Comments: 3+ pitting edema bilateral lower extremities up to the proximal shin.  Small circular ulcerated lesions that are 0.5 cm in diameter.  To present on the left lower extremity and to present on the right lower extremity.  They are draining a clear fluid.  Lesions on the right lower extremity are tender  to the touch and also erythema and swelling noted to the right ankle.  No lesions are present on the ankle but it is tender to the touch.  No erythema present around the lesions.  Skin:    General: Skin is warm and dry.     Findings: No rash.  Neurological:     Mental Status: She is alert and oriented to person, place, and time.     Cranial Nerves: No cranial nerve deficit.  Psychiatric:        Behavior: Behavior normal.     ED Results / Procedures / Treatments   Labs (all labs ordered are listed, but only abnormal results are displayed) Labs Reviewed  CBC WITH DIFFERENTIAL/PLATELET - Abnormal; Notable for the following components:      Result Value   WBC 13.9 (*)    Neutro Abs 11.7 (*)    Monocytes Absolute 1.3 (*)    Abs Immature Granulocytes 0.21 (*)    All other components within normal limits  COMPREHENSIVE METABOLIC PANEL - Abnormal; Notable for the following components:   Glucose, Bld 116 (*)    BUN 39 (*)    Creatinine, Ser 1.26 (*)    Albumin 3.1 (*)    GFR calc non Af Amer 45 (*)    GFR calc Af Amer 52 (*)    All other components within normal limits    EKG EKG Interpretation  Date/Time:  Saturday Jul 21 2019 16:45:38 EDT Ventricular Rate:  107 PR Interval:    QRS Duration: 68 QT Interval:  297 QTC Calculation: 397 R Axis:   -6 Text Interpretation: Sinus tachycardia Atrial  premature complex LAE, consider biatrial enlargement Low voltage, extremity and precordial leads Consider anterior infarct Baseline wander in lead(s) I aVR aVL V2 V3 V6 Confirmed by Gwyneth Sprout ([id number]) on 07/21/2019 5:13:03 PM   Radiology No results found.  Procedures Procedures (including critical care time)  Medications Ordered in ED Medications - No data to display  ED Course  I have reviewed the triage vital signs and the nursing notes.  Pertinent labs & imaging results that were available during my care of the patient were reviewed by me and considered in my medical decision making (see chart for details).    MDM Rules/Calculators/A&P                      Elderly female with multiple medical problems and chronic immunosuppression and heart failure presenting with new open draining lesions on the lower extremities for the last 2 weeks.  It is gradually worsening and today she is having significant pain in her ankle.  Wounds on the legs do not appear infectious in nature are not red or draining purulent material.  Concerned they may be inflammatory in nature given patient's history of giant cell arteritis, PMR and rheumatoid arthritis.  They did start shortly after she decreased her dose of prednisone.  Also could be a result of additional edema in her lower extremities as she is no longer wearing the support hose due to pain when attempting to wear them.  She does have erythema and swelling as well as tenderness to the right ankle below the new lesions but it is not hot to the touch are consistent with gout or septic joint.  Given patient is diabetic and immunosuppressed possibility that it could be early infection but she is not having systemic symptoms.  Patient is tachycardic here and reports that several weeks ago her creatinine  was increasing and they decreased her Lasix.  Will ensure patient is not having a climbing leukocytosis that her renal function remains stable and an EKG to  ensure she is in sinus rhythm.  5:37 PM CBC with persistent leukocytosis of 13 which is unchanged since she started prednisone in September.  CMP with unchanged creatinine of 1.26 today level was 1.2 at the end of April.  EKG showed improved heart rate of 110 and sinus tachycardia with no evidence of dysrhythmia.  Patient is to follow-up with cardiology on Tuesday as planned.  Also she will go back to her elevated dose of prednisone as this could be inflammatory in nature.  At this time low concern for infection.  Even with foot elevation redness in the ankle is resolving.  Patient had Ace wrap was placed on both legs for compression and she will discuss with cardiologist about change in diuretic therapy.  MDM Number of Diagnoses or Management Options   Amount and/or Complexity of Data Reviewed Clinical lab tests: ordered and reviewed Tests in the radiology section of CPT: ordered and reviewed Decide to obtain previous medical records or to obtain history from someone other than the patient: yes Obtain history from someone other than the patient: yes Review and summarize past medical records: yes Discuss the patient with other providers: no Independent visualization of images, tracings, or specimens: yes  Risk of Complications, Morbidity, and/or Mortality Presenting problems: moderate Diagnostic procedures: low Management options: low  Patient Progress Patient progress: stable    Final Clinical Impression(s) / ED Diagnoses Final diagnoses:  Peripheral edema  Wound of lower extremity, unspecified laterality, initial encounter    Rx / DC Orders ED Discharge Orders    None       Gwyneth Sprout, MD 07/21/19 1738    Gwyneth Sprout, MD 07/21/19 1739

## 2019-07-23 ENCOUNTER — Other Ambulatory Visit: Payer: Self-pay

## 2019-07-23 ENCOUNTER — Ambulatory Visit (INDEPENDENT_AMBULATORY_CARE_PROVIDER_SITE_OTHER): Payer: Medicare Other

## 2019-07-23 ENCOUNTER — Encounter: Payer: Self-pay | Admitting: Physician Assistant

## 2019-07-23 ENCOUNTER — Ambulatory Visit (INDEPENDENT_AMBULATORY_CARE_PROVIDER_SITE_OTHER): Payer: Medicare Other | Admitting: Physician Assistant

## 2019-07-23 DIAGNOSIS — M25571 Pain in right ankle and joints of right foot: Secondary | ICD-10-CM

## 2019-07-23 DIAGNOSIS — L03115 Cellulitis of right lower limb: Secondary | ICD-10-CM

## 2019-07-23 MED ORDER — DOXYCYCLINE HYCLATE 100 MG PO TABS: 100.0000 mg | ORAL_TABLET | Freq: Two times a day (BID) | ORAL | 0 refills | Status: AC

## 2019-07-23 NOTE — Addendum Note (Signed)
Addended by: Mardene Celeste B on: 07/23/2019 02:16 PM   Modules accepted: Orders

## 2019-07-23 NOTE — Progress Notes (Signed)
Office Visit Note   Patient: Kaitlin Hudson           Date of Birth: August 14, 1955           MRN: [id number] Visit Date: 07/23/2019              Requested by: No referring provider defined for this encounter. PCP: Earley Abide (Inactive)   Assessment & Plan: Visit Diagnoses:  1. Pain in right ankle and joints of right foot   2. Cellulitis of right lower extremity     Plan: We will place her on doxycycline 100 mg twice daily.  Recommend elevation of both legs periodically throughout the day.  We will order ABIs lower extremities.  Have her follow-up with Dr. Lajoyce Corners in 1 week.  Follow-Up Instructions: Return in about 1 week (around 07/30/2019), or Dr. Lajoyce Corners.   Orders:  Orders Placed This Encounter  Procedures  . XR Ankle Complete Right   Meds ordered this encounter  Medications  . doxycycline (VIBRA-TABS) 100 MG tablet    Sig: Take 1 tablet (100 mg total) by mouth 2 (two) times daily for 14 days.    Dispense:  28 tablet    Refill:  0      Procedures: No procedures performed   Clinical Data: No additional findings.   Subjective: Chief Complaint  Patient presents with  . Right Ankle - Pain    HPI Kaitlin Hudson 64 year old female well-known to Dr. Audrie Lia service comes in today due to right ankle pain.  States on Saturday she had swelling and redness about the right ankle.  No known injury.  She has recently over the last 2 weeks developed some ulcerations on both lower legs she felt was coming from her Vive socks and has discontinued them.  She had no fevers chills.  Did have lab work in the ER over the weekend and white count was 13,900.  She is on chronic prednisone.  Past medical history pertinent for diabetes mellitus, CHF hypertension, rheumatoid arthritis and latent tuberculosis.  Review of Systems Denies any fevers chills shortness of breath chest pain.  Objective: Vital Signs: There were no vitals taken for this visit.  Physical Exam Constitutional:    Appearance: She is not ill-appearing or diaphoretic.  Neurological:     Mental Status: She is alert and oriented to person, place, and time.  Psychiatric:        Mood and Affect: Mood normal.     Ortho Exam Lower extremities bilateral pitting edema.  Calves are nontender.  She has tenderness over the lateral aspect of the right ankle.  Cellulitis over the right ankle.  Multiple small ulcerations superficial over both legs.  Some weeping secondary to venous insufficiency bilateral legs.  Palpable dorsal pedal pulse bilaterally.   Specialty Comments:  No specialty comments available.  Imaging: XR Ankle Complete Right  Result Date: 07/23/2019 Right ankle 3 views: No acute fractures.  No acute findings.  Mild arthritic changes.  Arthrosclerosis changes noted.  Talus well located within the ankle mortise without diastases.    PMFS History: Patient Active Problem List   Diagnosis Date Noted  . Chronic diastolic heart failure (HCC) 09/12/2013  . Rheumatoid arthritis ([facility]) 09/12/2013  . IDDM (insulin dependent diabetes mellitus) 09/09/2013  . Cellulitis of right lower extremity 09/08/2013   Past Medical History:  Diagnosis Date  . Arthritis, rheumatoid (H[facility])   . CHF (congestive heart failure) (H[facility])   . Diabetes mellitus without complication ([facility])   .  Hyperlipidemia   . Hypertension   . Latent tuberculosis     History reviewed. No pertinent family history.  Past Surgical History:  Procedure Laterality Date  . CARPAL TUNNEL RELEASE Bilateral   . FOOT SURGERY    . JOINT REPLACEMENT     Social History   Occupational History  . Not on file  Tobacco Use  . Smoking status: Never Smoker  . Smokeless tobacco: Never Used  Substance and Sexual Activity  . Alcohol use: No  . Drug use: No  . Sexual activity: Not on file

## 2019-07-26 ENCOUNTER — Telehealth: Payer: Self-pay

## 2019-07-26 ENCOUNTER — Ambulatory Visit (INDEPENDENT_AMBULATORY_CARE_PROVIDER_SITE_OTHER): Payer: Medicare Other | Admitting: Orthopedic Surgery

## 2019-07-26 ENCOUNTER — Encounter: Payer: Self-pay | Admitting: Orthopedic Surgery

## 2019-07-26 ENCOUNTER — Other Ambulatory Visit: Payer: Self-pay

## 2019-07-26 VITALS — Ht 64.0 in | Wt 180.0 lb

## 2019-07-26 DIAGNOSIS — L97919 Non-pressure chronic ulcer of unspecified part of right lower leg with unspecified severity: Secondary | ICD-10-CM | POA: Diagnosis not present

## 2019-07-26 DIAGNOSIS — L97929 Non-pressure chronic ulcer of unspecified part of left lower leg with unspecified severity: Secondary | ICD-10-CM

## 2019-07-26 DIAGNOSIS — L03115 Cellulitis of right lower limb: Secondary | ICD-10-CM

## 2019-07-26 DIAGNOSIS — I87333 Chronic venous hypertension (idiopathic) with ulcer and inflammation of bilateral lower extremity: Secondary | ICD-10-CM | POA: Diagnosis not present

## 2019-07-26 NOTE — Telephone Encounter (Signed)
Patient called stating that she was seen on Monday, 07/23/2019 for her right foot and was given an antibiotic to take, but states that she is in a lot of pain and that her right foot is swollen, burning, and red.  Patient has a appointment to return on Monday, 07/30/2019.  Cb# [phone].  Please advise.  Thank you.

## 2019-07-26 NOTE — Telephone Encounter (Signed)
Called pt to come in for appt today.

## 2019-07-30 ENCOUNTER — Other Ambulatory Visit: Payer: Self-pay

## 2019-07-30 ENCOUNTER — Encounter: Payer: Self-pay | Admitting: Orthopedic Surgery

## 2019-07-30 ENCOUNTER — Ambulatory Visit (INDEPENDENT_AMBULATORY_CARE_PROVIDER_SITE_OTHER): Payer: Medicare Other | Admitting: Physician Assistant

## 2019-07-30 DIAGNOSIS — L03115 Cellulitis of right lower limb: Secondary | ICD-10-CM

## 2019-07-30 NOTE — Progress Notes (Signed)
Office Visit Note   Patient: Kaitlin Hudson           Date of Birth: 07-Dec-1955           MRN: [id number] Visit Date: 07/26/2019              Requested by: No referring provider defined for this encounter. PCP: Earley Abide (Inactive)  Chief Complaint  Patient presents with  . Right Leg - Pain      HPI: Patient is a 64 year old woman who presents with venous insufficiency both legs with weeping edema.  Ulceration and cellulitis.  She has started doxycycline.  Patient has is ABIs ordered.  Patient states that today she has increased pain swelling burning redness and weeping edema.  She complains of a small ulcer on the lateral aspect of the left leg.  Assessment & Plan: Visit Diagnoses:  1. Cellulitis of right lower extremity   2. 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  We will wrap both legs with Dynaflex compression wrap and reevaluate on Monday.  Follow-Up Instructions: Return in about 1 week (around 08/02/2019).   Ortho Exam  Patient is alert, oriented, no adenopathy, well-dressed, normal affect, normal respiratory effort. Examination patient has clear weeping edema from both legs with massive venous insufficiency.  She has small ulcers on both lower extremities.  There is more edema in the right leg than the left.  Patient states she is on a fluid pill with renal disease.  Imaging: No results found. No images are attached to the encounter.  Labs: Lab Results  Component Value Date   LABURIC 2.5 09/11/2013   REPTSTATUS 11/13/2015 FINAL 11/07/2015   CULT  11/07/2015    NO GROWTH 5 DAYS Performed at [facility]    [facility] ESCHERICHIA COLI (A) 11/07/2015     Lab Results  Component Value Date   ALBUMIN 3.1 (L) 07/21/2019   ALBUMIN 3.2 (L) 11/07/2015   LABURIC 2.5 09/11/2013    No results found for: MG No results found for: VD25OH  No results found for: PREALBUMIN CBC  EXTENDED Latest Ref Rng & Units 07/21/2019 11/07/2015 09/12/2013  WBC 4.0 - 10.5 K/uL 13.9(H) 12.2(H) 8.2  RBC 3.87 - 5.11 MIL/uL 4.43 4.06 3.40(L)  HGB 12.0 - 15.0 g/dL 40.9 11.7(L) 9.8(L)  HCT 36.0 - 46.0 % 40.2 35.4(L) 29.5(L)  PLT 150 - 400 K/uL 233 250 298  NEUTROABS 1.7 - 7.7 K/uL 11.7(H) 8.2(H) -  LYMPHSABS 0.7 - 4.0 K/uL 0.7 2.3 -     Body mass index is 30.9 kg/m.  Orders:  No orders of the defined types were placed in this encounter.  No orders of the defined types were placed in this encounter.    Procedures: No procedures performed  Clinical Data: No additional findings.  ROS:  All other systems negative, except as noted in the HPI. Review of Systems  Objective: Vital Signs: Ht 5\' 4"  (1.626 m)   Wt 180 lb (81.6 kg)   BMI 30.90 kg/m   Specialty Comments:  No specialty comments available.  PMFS History: Patient Active Problem List   Diagnosis Date Noted  . Chronic diastolic heart failure (HCC) 09/12/2013  . Rheumatoid arthritis (HCC) 09/12/2013  . IDDM (insulin dependent diabetes mellitus) 09/09/2013  . Cellulitis of right lower extremity 09/08/2013   Past Medical History:  Diagnosis Date  . Arthritis, rheumatoid (HCC)   . CHF (congestive heart failure) (HCC)   .  Diabetes mellitus without complication ([place])   . Hyperlipidemia   . Hypertension   . Latent tuberculosis     History reviewed. No pertinent family history.  Past Surgical History:  Procedure Laterality Date  . CARPAL TUNNEL RELEASE Bilateral   . FOOT SURGERY    . JOINT REPLACEMENT     Social History   Occupational History  . Not on file  Tobacco Use  . Smoking status: Never Smoker  . Smokeless tobacco: Never Used  Substance and Sexual Activity  . Alcohol use: No  . Drug use: No  . Sexual activity: Not on file

## 2019-07-30 NOTE — Progress Notes (Signed)
Office Visit Note   Patient: Kaitlin Hudson           Date of Birth: 02-23-1956           MRN: [id number] Visit Date: 07/30/2019              Requested by: No referring provider defined for this encounter. PCP: Earley Abide (Inactive)  Chief Complaint  Patient presents with  . Right Leg - Follow-up  . Left Leg - Follow-up      HPI: Patient is a pleasant 64 year old woman who follows up for her right lower extremity cellulitis and ulcers on both of her feet.  She states that both legs are feeling much better after having wraps.  She took off the wrap on the right side on Saturday.  She still has some swelling in her foot and ankle but this has improved.  She also thinks this feels red.  Assessment & Plan: Visit Diagnoses: No diagnosis found.  Plan: I discussed with her that I think she would do well in a compression stocking.  She said she has trouble putting these on and are quite hot.  She has not tried it since the swelling has gone down in her feet though she does not want to do the Dynaflex wraps because she likes to be able to shower daily.  I will wrap her in a compression with an Ace wrap.  She has agreed that now that her swelling is decreased she will try and place the sock once more.  Follow-up in 1 week.  Follow-Up Instructions: No follow-ups on file.   Ortho Exam  Patient is alert, oriented, no adenopathy, well-dressed, normal affect, normal respiratory effort. Focused examination demonstrates moderate soft tissue swelling in her foot.  On the right side mild erythema and a Wagner grade 1 ulcer which is only about half by half centimeter.  Serous drainage without any foul odor.  She also has a small blister on the posterior aspect of her lower calf.  No cellulitis  Imaging: No results found. No images are attached to the encounter.  Labs: Lab Results  Component Value Date   LABURIC 2.5 09/11/2013   REPTSTATUS 11/13/2015 FINAL 11/07/2015   CULT  11/07/2015    NO GROWTH 5 DAYS Performed at [facility]    [facility] ESCHERICHIA COLI (A) 11/07/2015     Lab Results  Component Value Date   ALBUMIN 3.1 (L) 07/21/2019   ALBUMIN 3.2 (L) 11/07/2015   LABURIC 2.5 09/11/2013    No results found for: MG No results found for: VD25OH  No results found for: PREALBUMIN CBC EXTENDED Latest Ref Rng & Units 07/21/2019 11/07/2015 09/12/2013  WBC 4.0 - 10.5 K/uL 13.9(H) 12.2(H) 8.2  RBC 3.87 - 5.11 MIL/uL 4.43 4.06 3.40(L)  HGB 12.0 - 15.0 g/dL 12.4 11.7(L) 9.8(L)  HCT 36.0 - 46.0 % 40.2 35.4(L) 29.5(L)  PLT 150 - 400 K/uL 233 250 298  NEUTROABS 1.7 - 7.7 K/uL 11.7(H) 8.2(H) -  LYMPHSABS 0.7 - 4.0 K/uL 0.7 2.3 -     There is no height or weight on file to calculate BMI.  Orders:  No orders of the defined types were placed in this encounter.  No orders of the defined types were placed in this encounter.    Procedures: No procedures performed  Clinical Data: No additional findings.  ROS:  All other systems negative, except as noted in the HPI. Review of Systems  Objective: Vital Signs: There  were no vitals taken for this visit.  Specialty Comments:  No specialty comments available.  PMFS History: Patient Active Problem List   Diagnosis Date Noted  . Chronic diastolic heart failure ([place]) 09/12/2013  . Rheumatoid arthritis ([place]) 09/12/2013  . IDDM (insulin dependent diabetes mellitus) 09/09/2013  . Cellulitis of right lower extremity 09/08/2013   Past Medical History:  Diagnosis Date  . Arthritis, rheumatoid ([place])   . CHF (congestive heart failure) ([place])   . Diabetes mellitus without complication ([place])   . Hyperlipidemia   . Hypertension   . Latent tuberculosis     No family history on file.  Past Surgical History:  Procedure Laterality Date  . CARPAL TUNNEL RELEASE Bilateral   . FOOT SURGERY    . JOINT REPLACEMENT     Social History   Occupational History  . Not on file  Tobacco Use  . Smoking status: Never  Smoker  . Smokeless tobacco: Never Used  Substance and Sexual Activity  . Alcohol use: No  . Drug use: No  . Sexual activity: Not on file

## 2019-08-06 ENCOUNTER — Ambulatory Visit: Payer: Medicare Other | Admitting: Physician Assistant

## 2019-08-10 ENCOUNTER — Telehealth: Payer: Self-pay | Admitting: Orthopedic Surgery

## 2019-08-10 NOTE — Telephone Encounter (Signed)
Appt on Kaitlin Hudson's sch but noted for Dr. Lajoyce Corners to see for an exposed tendon.

## 2019-08-10 NOTE — Telephone Encounter (Signed)
Patient called.   She is complaining of a wound that will not heal and wants to be seen as soon as possible. She is refusing to see a PA and is not okay with [facility]'s next available being 6/7  Call back: [phone]

## 2019-08-14 ENCOUNTER — Encounter (HOSPITAL_COMMUNITY): Payer: Self-pay | Admitting: Orthopedic Surgery

## 2019-08-14 ENCOUNTER — Ambulatory Visit (INDEPENDENT_AMBULATORY_CARE_PROVIDER_SITE_OTHER): Payer: Medicare Other | Admitting: Orthopedic Surgery

## 2019-08-14 ENCOUNTER — Other Ambulatory Visit: Payer: Self-pay

## 2019-08-14 ENCOUNTER — Encounter: Payer: Self-pay | Admitting: Family

## 2019-08-14 VITALS — Ht 64.0 in | Wt 180.0 lb

## 2019-08-14 DIAGNOSIS — L97929 Non-pressure chronic ulcer of unspecified part of left lower leg with unspecified severity: Secondary | ICD-10-CM

## 2019-08-14 DIAGNOSIS — L97919 Non-pressure chronic ulcer of unspecified part of right lower leg with unspecified severity: Secondary | ICD-10-CM | POA: Diagnosis not present

## 2019-08-14 DIAGNOSIS — L97313 Non-pressure chronic ulcer of right ankle with necrosis of muscle: Secondary | ICD-10-CM

## 2019-08-14 DIAGNOSIS — I87333 Chronic venous hypertension (idiopathic) with ulcer and inflammation of bilateral lower extremity: Secondary | ICD-10-CM

## 2019-08-14 DIAGNOSIS — L03115 Cellulitis of right lower limb: Secondary | ICD-10-CM | POA: Diagnosis not present

## 2019-08-14 NOTE — Progress Notes (Signed)
Patient denies chest pain or shob. Patient states she had to cancel her stress test due to emergent surgery. Educated on Museum/gallery conservator. Patient is a type I DM. Spoke to Dr. Renold Don regarding cardiac history and DM on an insulin pump. Per Dr. Renold Don have patient decrease basal rate by 20%. I told patient tomorrow throughout the day if she had any concerns regarding her CBG levels to contact Short Stay for advice/ instruction.

## 2019-08-14 NOTE — Progress Notes (Signed)
Office Visit Note   Patient: Kaitlin Hudson           Date of Birth: Jan 26, 1956           MRN: [id number] Visit Date: 08/14/2019              Requested by: No referring provider defined for this encounter. PCP: Earley Abide (Inactive)  Chief Complaint  Patient presents with  . Right Ankle - Pain, Edema, Wound Check  . Right Foot - Pain, Edema, Wound Check      HPI: Patient is a 64 year old woman with diabetic insensate neuropathy and venous insufficiency with massive pitting edema of both lower extremities with an ulcer over the right Achilles.  Patient was initially treated with compression wraps she had to remove the wraps due to pain she has recently been followed at the wound center with silver alginate dressings and presents for evaluation for a new large ulcer over the right Achilles with exposed necrotic Achilles tendon.  Patient states that she has had a nuclear medicine stress test scheduled for about a year for her heart that was going to be performed this week recommend this be postponed for limb salvage intervention to the right ankle.  Assessment & Plan: Visit Diagnoses:  1. 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 Cellulitis of right lower extremity   3. Ankle ulcer, right, with necrosis of muscle (HCC)     Plan: We will plan for surgical intervention with debridement of the necrotic Achilles debridement of the wound possible placement of skin graft with a cleanse choice Prevena wound VAC.  Plan for overnight observation and discharge with the wound VAC.  Risks and benefits were discussed including risk of the wound not healing need for additional surgery.  Follow-Up Instructions: Return in about 1 week (around 08/21/2019).   Ortho Exam  Patient is alert, oriented, no adenopathy, well-dressed, normal affect, normal respiratory effort. Examination patient has massive pitting edema there is no  cellulitis in the right foot or leg.  I cannot palpate a pulse the Doppler was used and she has a biphasic dorsalis pedis and posterior tibial pulse with no clinical evidence of arterial insufficiency.  She has a 2 cm necrotic ulcer over the right Achilles with necrotic Achilles tendon.  Imaging: No results found. No images are attached to the encounter.  Labs: Lab Results  Component Value Date   LABURIC 2.5 09/11/2013   REPTSTATUS 11/13/2015 FINAL 11/07/2015   CULT  11/07/2015    NO GROWTH 5 DAYS Performed at [facility]    [facility] ESCHERICHIA COLI (A) 11/07/2015     Lab Results  Component Value Date   ALBUMIN 3.1 (L) 07/21/2019   ALBUMIN 3.2 (L) 11/07/2015   LABURIC 2.5 09/11/2013    No results found for: MG No results found for: VD25OH  No results found for: PREALBUMIN CBC EXTENDED Latest Ref Rng & Units 07/21/2019 11/07/2015 09/12/2013  WBC 4.0 - 10.5 K/uL 13.9(H) 12.2(H) 8.2  RBC 3.87 - 5.11 MIL/uL 4.43 4.06 3.40(L)  HGB 12.0 - 15.0 g/dL 29.9 11.7(L) 9.8(L)  HCT 36.0 - 46.0 % 40.2 35.4(L) 29.5(L)  PLT 150 - 400 K/uL 233 250 298  NEUTROABS 1.7 - 7.7 K/uL 11.7(H) 8.2(H) -  LYMPHSABS 0.7 - 4.0 K/uL 0.7 2.3 -     Body mass index is 30.9 kg/m.  Orders:  No orders of the defined types were placed in this encounter.  No orders of the defined types were placed in this encounter.    Procedures: No procedures performed  Clinical Data: No additional findings.  ROS:  All other systems negative, except as noted in the HPI. Review of Systems  Objective: Vital Signs: Ht 5\' 4"  (1.626 m)   Wt 180 lb (81.6 kg)   BMI 30.90 kg/m   Specialty Comments:  No specialty comments available.  PMFS History: Patient Active Problem List   Diagnosis Date Noted  . Chronic diastolic heart failure ([place]) 09/12/2013  . Rheumatoid arthritis ([place]) 09/12/2013  . IDDM (insulin dependent diabetes mellitus) 09/09/2013  . Cellulitis of right lower extremity 09/08/2013   Past  Medical History:  Diagnosis Date  . Arthritis, rheumatoid ([place])   . CHF (congestive heart failure) ([place])   . Diabetes mellitus without complication ([place])   . Hyperlipidemia   . Hypertension   . Latent tuberculosis     History reviewed. No pertinent family history.  Past Surgical History:  Procedure Laterality Date  . CARPAL TUNNEL RELEASE Bilateral   . FOOT SURGERY    . JOINT REPLACEMENT     Social History   Occupational History  . Not on file  Tobacco Use  . Smoking status: Never Smoker  . Smokeless tobacco: Never Used  Substance and Sexual Activity  . Alcohol use: No  . Drug use: No  . Sexual activity: Not on file

## 2019-08-15 ENCOUNTER — Ambulatory Visit (HOSPITAL_COMMUNITY): Payer: Medicare Other | Admitting: Certified Registered Nurse Anesthetist

## 2019-08-15 ENCOUNTER — Encounter (HOSPITAL_COMMUNITY): Payer: Self-pay | Admitting: Orthopedic Surgery

## 2019-08-15 ENCOUNTER — Inpatient Hospital Stay (HOSPITAL_COMMUNITY)
Admission: RE | Admit: 2019-08-15 | Discharge: 2019-08-20 | DRG: 623 | Disposition: A | Discharge status: Home Health | Payer: Medicare Other | Attending: Orthopedic Surgery | Admitting: Orthopedic Surgery

## 2019-08-15 ENCOUNTER — Encounter (HOSPITAL_COMMUNITY)
Admission: RE | Disposition: A | Discharge status: Home Health | Payer: Self-pay | Source: Home / Self Care | Attending: Orthopedic Surgery

## 2019-08-15 DIAGNOSIS — Z6832 Body mass index (BMI) 32.0-32.9, adult: Secondary | ICD-10-CM | POA: Diagnosis not present

## 2019-08-15 DIAGNOSIS — Z887 Allergy status to serum and vaccine status: Secondary | ICD-10-CM | POA: Diagnosis not present

## 2019-08-15 DIAGNOSIS — Z888 Allergy status to other drugs, medicaments and biological substances status: Secondary | ICD-10-CM

## 2019-08-15 DIAGNOSIS — E1052 Type 1 diabetes mellitus with diabetic peripheral angiopathy with gangrene: Secondary | ICD-10-CM | POA: Diagnosis present

## 2019-08-15 DIAGNOSIS — Z882 Allergy status to sulfonamides status: Secondary | ICD-10-CM

## 2019-08-15 DIAGNOSIS — Z91040 Latex allergy status: Secondary | ICD-10-CM

## 2019-08-15 DIAGNOSIS — M069 Rheumatoid arthritis, unspecified: Secondary | ICD-10-CM | POA: Diagnosis present

## 2019-08-15 DIAGNOSIS — I5032 Chronic diastolic (congestive) heart failure: Secondary | ICD-10-CM | POA: Diagnosis present

## 2019-08-15 DIAGNOSIS — Z794 Long term (current) use of insulin: Secondary | ICD-10-CM

## 2019-08-15 DIAGNOSIS — Z8615 Personal history of latent tuberculosis infection: Secondary | ICD-10-CM

## 2019-08-15 DIAGNOSIS — E1040 Type 1 diabetes mellitus with diabetic neuropathy, unspecified: Secondary | ICD-10-CM | POA: Diagnosis present

## 2019-08-15 DIAGNOSIS — L97413 Non-pressure chronic ulcer of right heel and midfoot with necrosis of muscle: Secondary | ICD-10-CM | POA: Diagnosis present

## 2019-08-15 DIAGNOSIS — Z20822 Contact with and (suspected) exposure to covid-19: Secondary | ICD-10-CM | POA: Diagnosis present

## 2019-08-15 DIAGNOSIS — Z881 Allergy status to other antibiotic agents status: Secondary | ICD-10-CM

## 2019-08-15 DIAGNOSIS — Z9109 Other allergy status, other than to drugs and biological substances: Secondary | ICD-10-CM

## 2019-08-15 DIAGNOSIS — L03115 Cellulitis of right lower limb: Secondary | ICD-10-CM | POA: Diagnosis present

## 2019-08-15 DIAGNOSIS — E785 Hyperlipidemia, unspecified: Secondary | ICD-10-CM | POA: Diagnosis present

## 2019-08-15 DIAGNOSIS — Z880 Allergy status to penicillin: Secondary | ICD-10-CM | POA: Diagnosis not present

## 2019-08-15 DIAGNOSIS — I110 Hypertensive heart disease with heart failure: Secondary | ICD-10-CM | POA: Diagnosis present

## 2019-08-15 DIAGNOSIS — Z886 Allergy status to analgesic agent status: Secondary | ICD-10-CM

## 2019-08-15 DIAGNOSIS — Z79899 Other long term (current) drug therapy: Secondary | ICD-10-CM | POA: Diagnosis not present

## 2019-08-15 DIAGNOSIS — E669 Obesity, unspecified: Secondary | ICD-10-CM | POA: Diagnosis present

## 2019-08-15 DIAGNOSIS — E10621 Type 1 diabetes mellitus with foot ulcer: Principal | ICD-10-CM | POA: Diagnosis present

## 2019-08-15 HISTORY — PX: I & D EXTREMITY: SHX5045

## 2019-08-15 HISTORY — DX: Type 1 diabetes mellitus without complications: E10.9

## 2019-08-15 LAB — CBC
HCT: 43 % (ref 36.0–46.0)
Hemoglobin: 13.5 g/dL (ref 12.0–15.0)
MCH: 29.7 pg (ref 26.0–34.0)
MCHC: 31.4 g/dL (ref 30.0–36.0)
MCV: 94.5 fL (ref 80.0–100.0)
Platelets: 238 10*3/uL (ref 150–400)
RBC: 4.55 MIL/uL (ref 3.87–5.11)
RDW: 14.5 % (ref 11.5–15.5)
WBC: 10.2 10*3/uL (ref 4.0–10.5)
nRBC: 0 % (ref 0.0–0.2)

## 2019-08-15 LAB — GLUCOSE, CAPILLARY
Glucose-Capillary: 180 mg/dL — ABNORMAL HIGH (ref 70–99)
Glucose-Capillary: 138 mg/dL — ABNORMAL HIGH (ref 70–99)
Glucose-Capillary: 105 mg/dL — ABNORMAL HIGH (ref 70–99)
Glucose-Capillary: 66 mg/dL — ABNORMAL LOW (ref 70–99)
Glucose-Capillary: 149 mg/dL — ABNORMAL HIGH (ref 70–99)

## 2019-08-15 LAB — BASIC METABOLIC PANEL
Anion gap: 13 (ref 5–15)
BUN: 38 mg/dL — ABNORMAL HIGH (ref 8–23)
CO2: 20 mmol/L — ABNORMAL LOW (ref 22–32)
Calcium: 9 mg/dL (ref 8.9–10.3)
Chloride: 110 mmol/L (ref 98–111)
Creatinine, Ser: 1.3 mg/dL — ABNORMAL HIGH (ref 0.44–1.00)
GFR calc Af Amer: 50 mL/min — ABNORMAL LOW (ref >60)
GFR calc non Af Amer: 43 mL/min — ABNORMAL LOW (ref >60)
Glucose, Bld: 182 mg/dL — ABNORMAL HIGH (ref 70–99)
Potassium: 4.4 mmol/L (ref 3.5–5.1)
Sodium: 143 mmol/L (ref 135–145)

## 2019-08-15 LAB — HEMOGLOBIN A1C
Hgb A1c MFr Bld: 7.2 % — ABNORMAL HIGH (ref 4.8–5.6)
Mean Plasma Glucose: 159.94 mg/dL

## 2019-08-15 LAB — SARS CORONAVIRUS 2 BY RT PCR (HOSPITAL ORDER, PERFORMED IN ~~LOC~~ HOSPITAL LAB): SARS Coronavirus 2: NEGATIVE

## 2019-08-15 LAB — SURGICAL PCR SCREEN
MRSA, PCR: NEGATIVE
Staphylococcus aureus: NEGATIVE

## 2019-08-15 SURGERY — IRRIGATION AND DEBRIDEMENT EXTREMITY
Anesthesia: General | Site: Ankle | Laterality: Right

## 2019-08-15 MED ORDER — PHENYLEPHRINE HCL (PRESSORS) 10 MG/ML IV SOLN
INTRAVENOUS | Status: DC | PRN
  Administered 2019-08-15: 180 ug via INTRAVENOUS

## 2019-08-15 MED ORDER — LISINOPRIL 20 MG PO TABS
60.0000 mg | ORAL_TABLET | Freq: Every day | ORAL | Status: DC
  Administered 2019-08-17 – 2019-08-20 (×4): 60 mg via ORAL
  Filled 2019-08-15 (×5): qty 3

## 2019-08-15 MED ORDER — DOCUSATE SODIUM 100 MG PO CAPS
100.0000 mg | ORAL_CAPSULE | Freq: Two times a day (BID) | ORAL | Status: DC
  Administered 2019-08-15 – 2019-08-20 (×9): 100 mg via ORAL
  Filled 2019-08-15 (×10): qty 1

## 2019-08-15 MED ORDER — ACETAMINOPHEN 10 MG/ML IV SOLN
1000.0000 mg | Freq: Once | INTRAVENOUS | Status: DC | PRN
  Administered 2019-08-15: 1000 mg via INTRAVENOUS

## 2019-08-15 MED ORDER — PROPOFOL 10 MG/ML IV BOLUS
INTRAVENOUS | Status: DC | PRN
  Administered 2019-08-15: 150 mg via INTRAVENOUS

## 2019-08-15 MED ORDER — PROMETHAZINE HCL 25 MG/ML IJ SOLN: 6.2500 mg | INTRAMUSCULAR | Status: DC | PRN

## 2019-08-15 MED ORDER — PREDNISONE 5 MG PO TABS
5.0000 mg | ORAL_TABLET | Freq: Every day | ORAL | Status: DC
  Administered 2019-08-15 – 2019-08-19 (×5): 5 mg via ORAL
  Filled 2019-08-15 (×5): qty 1

## 2019-08-15 MED ORDER — ORAL CARE MOUTH RINSE: 15.0000 mL | Freq: Once | OROMUCOSAL | Status: AC

## 2019-08-15 MED ORDER — OXYCODONE HCL 5 MG PO TABS
10.0000 mg | ORAL_TABLET | ORAL | Status: DC | PRN
  Administered 2019-08-16 – 2019-08-17 (×3): 15 mg via ORAL
  Administered 2019-08-18: 10 mg via ORAL
  Filled 2019-08-15 (×3): qty 3

## 2019-08-15 MED ORDER — ACETAMINOPHEN 10 MG/ML IV SOLN
INTRAVENOUS | Status: AC
  Filled 2019-08-15: qty 100

## 2019-08-15 MED ORDER — MAGNESIUM CITRATE PO SOLN: 1.0000 | Freq: Once | ORAL | Status: DC | PRN

## 2019-08-15 MED ORDER — HYDROMORPHONE HCL 1 MG/ML IJ SOLN
0.5000 mg | INTRAMUSCULAR | Status: DC | PRN
  Administered 2019-08-15 – 2019-08-16 (×4): 1 mg via INTRAVENOUS
  Filled 2019-08-15 (×5): qty 1

## 2019-08-15 MED ORDER — VANCOMYCIN HCL 1750 MG/350ML IV SOLN
1750.0000 mg | Freq: Once | INTRAVENOUS | Status: AC
  Administered 2019-08-15: 1750 mg via INTRAVENOUS
  Filled 2019-08-15: qty 350

## 2019-08-15 MED ORDER — VANCOMYCIN HCL 1250 MG/250ML IV SOLN
1250.0000 mg | INTRAVENOUS | Status: DC
  Administered 2019-08-16 – 2019-08-19 (×4): 1250 mg via INTRAVENOUS
  Filled 2019-08-15 (×4): qty 250

## 2019-08-15 MED ORDER — INSULIN ASPART 100 UNIT/ML ~~LOC~~ SOLN: 6.0000 [IU] | Freq: Three times a day (TID) | SUBCUTANEOUS | Status: DC

## 2019-08-15 MED ORDER — ONDANSETRON HCL 4 MG/2ML IJ SOLN
INTRAMUSCULAR | Status: DC | PRN
  Administered 2019-08-15: 4 mg via INTRAVENOUS

## 2019-08-15 MED ORDER — ONDANSETRON HCL 4 MG PO TABS
4.0000 mg | ORAL_TABLET | Freq: Four times a day (QID) | ORAL | Status: DC | PRN
  Administered 2019-08-16: 4 mg via ORAL
  Filled 2019-08-15: qty 1

## 2019-08-15 MED ORDER — FENTANYL CITRATE (PF) 100 MCG/2ML IJ SOLN
INTRAMUSCULAR | Status: AC
  Filled 2019-08-15: qty 2

## 2019-08-15 MED ORDER — SODIUM CHLORIDE 0.9 % IR SOLN
Status: DC | PRN
  Administered 2019-08-15: 3000 mL

## 2019-08-15 MED ORDER — ACETAMINOPHEN 325 MG PO TABS
325.0000 mg | ORAL_TABLET | Freq: Four times a day (QID) | ORAL | Status: DC | PRN
  Administered 2019-08-19 (×2): 650 mg via ORAL
  Filled 2019-08-15 (×2): qty 2

## 2019-08-15 MED ORDER — PREDNISONE 10 MG PO TABS
10.0000 mg | ORAL_TABLET | Freq: Every day | ORAL | Status: DC
  Administered 2019-08-17 – 2019-08-20 (×4): 10 mg via ORAL
  Filled 2019-08-15 (×5): qty 1

## 2019-08-15 MED ORDER — FENTANYL CITRATE (PF) 250 MCG/5ML IJ SOLN
INTRAMUSCULAR | Status: DC | PRN
  Administered 2019-08-15: 150 ug via INTRAVENOUS

## 2019-08-15 MED ORDER — METHOCARBAMOL 1000 MG/10ML IJ SOLN
500.0000 mg | Freq: Four times a day (QID) | INTRAVENOUS | Status: DC | PRN
  Filled 2019-08-15: qty 5

## 2019-08-15 MED ORDER — METOCLOPRAMIDE HCL 5 MG PO TABS: 5.0000 mg | ORAL_TABLET | Freq: Three times a day (TID) | ORAL | Status: DC | PRN

## 2019-08-15 MED ORDER — ONDANSETRON HCL 4 MG/2ML IJ SOLN
4.0000 mg | Freq: Four times a day (QID) | INTRAMUSCULAR | Status: DC | PRN
  Administered 2019-08-16: 4 mg via INTRAVENOUS
  Filled 2019-08-15: qty 2

## 2019-08-15 MED ORDER — LACTATED RINGERS IV SOLN: INTRAVENOUS | Status: DC

## 2019-08-15 MED ORDER — ENOXAPARIN SODIUM 40 MG/0.4ML ~~LOC~~ SOLN
40.0000 mg | SUBCUTANEOUS | Status: DC
  Administered 2019-08-16 – 2019-08-20 (×5): 40 mg via SUBCUTANEOUS
  Filled 2019-08-15 (×5): qty 0.4

## 2019-08-15 MED ORDER — SODIUM CHLORIDE 0.9 % IV SOLN: INTRAVENOUS | Status: DC

## 2019-08-15 MED ORDER — MIDAZOLAM HCL 2 MG/2ML IJ SOLN
INTRAMUSCULAR | Status: AC
  Filled 2019-08-15: qty 2

## 2019-08-15 MED ORDER — FENTANYL CITRATE (PF) 250 MCG/5ML IJ SOLN
INTRAMUSCULAR | Status: AC
  Filled 2019-08-15: qty 5

## 2019-08-15 MED ORDER — POLYETHYLENE GLYCOL 3350 17 G PO PACK: 17.0000 g | PACK | Freq: Every day | ORAL | Status: DC | PRN

## 2019-08-15 MED ORDER — LIDOCAINE HCL (CARDIAC) PF 100 MG/5ML IV SOSY
PREFILLED_SYRINGE | INTRAVENOUS | Status: DC | PRN
  Administered 2019-08-15: 100 mg via INTRAVENOUS

## 2019-08-15 MED ORDER — FENTANYL CITRATE (PF) 100 MCG/2ML IJ SOLN
25.0000 ug | INTRAMUSCULAR | Status: DC | PRN
  Administered 2019-08-15 (×2): 50 ug via INTRAVENOUS

## 2019-08-15 MED ORDER — METHOCARBAMOL 500 MG PO TABS
500.0000 mg | ORAL_TABLET | Freq: Four times a day (QID) | ORAL | Status: DC | PRN
  Administered 2019-08-15: 500 mg via ORAL
  Filled 2019-08-15 (×3): qty 1

## 2019-08-15 MED ORDER — BISACODYL 10 MG RE SUPP: 10.0000 mg | Freq: Every day | RECTAL | Status: DC | PRN

## 2019-08-15 MED ORDER — 0.9 % SODIUM CHLORIDE (POUR BTL) OPTIME
TOPICAL | Status: DC | PRN
  Administered 2019-08-15: 1000 mL

## 2019-08-15 MED ORDER — CHLORHEXIDINE GLUCONATE 0.12 % MT SOLN
15.0000 mL | Freq: Once | OROMUCOSAL | Status: AC
  Administered 2019-08-15: 15 mL via OROMUCOSAL
  Filled 2019-08-15: qty 15

## 2019-08-15 MED ORDER — MEPERIDINE HCL 25 MG/ML IJ SOLN: 6.2500 mg | INTRAMUSCULAR | Status: DC | PRN

## 2019-08-15 MED ORDER — INSULIN ASPART 100 UNIT/ML ~~LOC~~ SOLN: 0.0000 [IU] | Freq: Three times a day (TID) | SUBCUTANEOUS | Status: DC

## 2019-08-15 MED ORDER — METOCLOPRAMIDE HCL 5 MG/ML IJ SOLN
5.0000 mg | Freq: Three times a day (TID) | INTRAMUSCULAR | Status: DC | PRN
  Administered 2019-08-16: 10 mg via INTRAVENOUS
  Filled 2019-08-15: qty 2

## 2019-08-15 MED ORDER — OXYCODONE HCL 5 MG PO TABS
5.0000 mg | ORAL_TABLET | ORAL | Status: DC | PRN
  Administered 2019-08-15 (×2): 5 mg via ORAL
  Administered 2019-08-17 – 2019-08-18 (×4): 10 mg via ORAL
  Filled 2019-08-15 (×3): qty 2
  Filled 2019-08-15 (×2): qty 1
  Filled 2019-08-15 (×2): qty 2

## 2019-08-15 SURGICAL SUPPLY — 39 items
ALLOGRAFT SKIN MESHD 387 SQ CM (Graft) ×1 IMPLANT
BLADE SURG 21 STRL SS (BLADE) ×3 IMPLANT
BNDG COHESIVE 6X5 TAN STRL LF (GAUZE/BANDAGES/DRESSINGS) ×2 IMPLANT
BNDG GAUZE ELAST 4 BULKY (GAUZE/BANDAGES/DRESSINGS) ×6 IMPLANT
CANISTER WOUNDNEG PRESSURE 500 (CANNISTER) ×2 IMPLANT
COVER SURGICAL LIGHT HANDLE (MISCELLANEOUS) ×6 IMPLANT
COVER WAND RF STERILE (DRAPES) IMPLANT
DRAPE U-SHAPE 47X51 STRL (DRAPES) ×3 IMPLANT
DRESSING VERAFLO CLEANSE CC (GAUZE/BANDAGES/DRESSINGS) IMPLANT
DRSG ADAPTIC 3X8 NADH LF (GAUZE/BANDAGES/DRESSINGS) ×3 IMPLANT
DRSG VERAFLO CLEANSE CC (GAUZE/BANDAGES/DRESSINGS) ×3
DURAPREP 26ML APPLICATOR (WOUND CARE) ×3 IMPLANT
ELECT REM PT RETURN 9FT ADLT (ELECTROSURGICAL)
ELECTRODE REM PT RTRN 9FT ADLT (ELECTROSURGICAL) IMPLANT
GAUZE SPONGE 4X4 12PLY STRL (GAUZE/BANDAGES/DRESSINGS) ×3 IMPLANT
GLOVE BIOGEL PI IND STRL 9 (GLOVE) ×1 IMPLANT
GLOVE BIOGEL PI INDICATOR 9 (GLOVE) ×2
GLOVE SURG ORTHO 9.0 STRL STRW (GLOVE) ×3 IMPLANT
GOWN STRL REUS W/ TWL XL LVL3 (GOWN DISPOSABLE) ×2 IMPLANT
GOWN STRL REUS W/TWL XL LVL3 (GOWN DISPOSABLE) ×6
GRAFT TISS MESH 387 BURN (Graft) IMPLANT
HANDPIECE INTERPULSE COAX TIP (DISPOSABLE)
KIT BASIN OR (CUSTOM PROCEDURE TRAY) ×3 IMPLANT
KIT TURNOVER KIT B (KITS) ×3 IMPLANT
MANIFOLD NEPTUNE II (INSTRUMENTS) ×3 IMPLANT
NS IRRIG 1000ML POUR BTL (IV SOLUTION) ×3 IMPLANT
PACK ORTHO EXTREMITY (CUSTOM PROCEDURE TRAY) ×3 IMPLANT
PAD ARMBOARD 7.5X6 YLW CONV (MISCELLANEOUS) ×6 IMPLANT
SET HNDPC FAN SPRY TIP SCT (DISPOSABLE) IMPLANT
SKIN MESHED 387 SQ (Graft) ×1 IMPLANT
SKIN MESHED 387 SQ CM (Graft) ×3 IMPLANT
STOCKINETTE IMPERVIOUS 9X36 MD (GAUZE/BANDAGES/DRESSINGS) IMPLANT
SUT ETHILON 2 0 PSLX (SUTURE) ×3 IMPLANT
SWAB COLLECTION DEVICE MRSA (MISCELLANEOUS) ×3 IMPLANT
SWAB CULTURE ESWAB REG 1ML (MISCELLANEOUS) IMPLANT
TOWEL GREEN STERILE (TOWEL DISPOSABLE) ×3 IMPLANT
TUBE CONNECTING 12'X1/4 (SUCTIONS) ×1
TUBE CONNECTING 12X1/4 (SUCTIONS) ×2 IMPLANT
YANKAUER SUCT BULB TIP NO VENT (SUCTIONS) ×3 IMPLANT

## 2019-08-15 NOTE — Anesthesia Preprocedure Evaluation (Addendum)
Anesthesia Evaluation  Patient identified by MRN, date of birth, ID band Patient awake    Reviewed: Allergy & Precautions, NPO status , Patient's Chart, lab work & pertinent test results  Airway Mallampati: III       Dental no notable dental hx. (+) Teeth Intact   Pulmonary neg pulmonary ROS,    Pulmonary exam normal breath sounds clear to auscultation       Cardiovascular hypertension, Pt. on medications +CHF  Normal cardiovascular exam Rhythm:Regular Rate:Normal     Neuro/Psych negative neurological ROS  negative psych ROS   GI/Hepatic negative GI ROS, Neg liver ROS,   Endo/Other  diabetes, Well Controlled, Type 1, Insulin Dependent  Renal/GU Renal InsufficiencyRenal disease  negative genitourinary   Musculoskeletal  (+) Arthritis , Rheumatoid disorders,    Abdominal (+) + obese,   Peds  Hematology negative hematology ROS (+)   Anesthesia Other Findings   Reproductive/Obstetrics                            Anesthesia Physical Anesthesia Plan  ASA: II  Anesthesia Plan: General   Post-op Pain Management:    Induction: Intravenous  PONV Risk Score and Plan: 4 or greater and Ondansetron and Treatment may vary due to age or medical condition  Airway Management Planned: LMA  Additional Equipment: None  Intra-op Plan:   Post-operative Plan: Extubation in OR  Informed Consent: I have reviewed the patients History and Physical, chart, labs and discussed the procedure including the risks, benefits and alternatives for the proposed anesthesia with the patient or authorized representative who has indicated his/her understanding and acceptance.     Dental advisory given  Plan Discussed with: CRNA  Anesthesia Plan Comments:         Anesthesia Quick Evaluation

## 2019-08-15 NOTE — Transfer of Care (Signed)
Immediate Anesthesia Transfer of Care Note  Patient: Kaitlin Hudson  Procedure(s) Performed: RIGHT ACHILLES DEBRIDEMENT, POSSIBLE SKIN GRAFT (Right Ankle)  Patient Location: PACU  Anesthesia Type:General  Level of Consciousness: awake, alert , oriented and patient cooperative  Airway & Oxygen Therapy: Patient Spontanous Breathing and Patient connected to nasal cannula oxygen  Post-op Assessment: Report given to RN and Post -op Vital signs reviewed and stable  Post vital signs: Reviewed and stable  Last Vitals:  Vitals Value Taken Time  BP 120/57 08/15/19 1722  Temp    Pulse 94 08/15/19 1724  Resp 12 08/15/19 1724  SpO2 100 % 08/15/19 1724  Vitals shown include unvalidated device data.  Last Pain:  Vitals:   08/15/19 1206  TempSrc: Tympanic  PainSc:       Patients Stated Pain Goal: 3 (08/15/19 1159)  Complications: No apparent anesthesia complications

## 2019-08-15 NOTE — H&P (Signed)
Kaitlin Hudson is an 64 y.o. female.   Chief Complaint: Painful right Achilles ulcer HPI: Patient is a 64 year old woman with diabetic insensate neuropathy and venous insufficiency with massive pitting edema of both lower extremities with an ulcer over the right Achilles.  Patient was initially treated with compression wraps she had to remove the wraps due to pain she has recently been followed at the wound center with silver alginate dressings and presents for evaluation for a new large ulcer over the right Achilles with exposed necrotic Achilles tendon.  Patient states that she has had a nuclear medicine stress test scheduled for about a year for her heart that was going to be performed this week recommend this be postponed for limb salvage intervention to the right ankle.  Past Medical History:  Diagnosis Date  . Arthritis, rheumatoid (HCC)   . CHF (congestive heart failure) (HCC)   . Diabetes mellitus without complication (HCC)   . Hyperlipidemia   . Hypertension   . Latent tuberculosis   . Type 1 diabetes [facility]     Past Surgical History:  Procedure Laterality Date  . CARPAL TUNNEL RELEASE Bilateral   . FOOT SURGERY    . JOINT REPLACEMENT      History reviewed. No pertinent family history. Social History:  reports that she has never smoked. She has never used smokeless tobacco. She reports that she does not drink alcohol or use drugs.  Allergies:  Allergies  Allergen Reactions  . Aspirin Other (See Comments)    Nose bleed  . Latex Rash  . Other Rash    VINYL   . Cimzia [Certolizumab Pegol] Other (See Comments)    bleeding  . Clindamycin/Lincomycin Diarrhea  . Diclofenac Nausea Only  . Enbrel [Etanercept] Hives  . Erythromycin Other (See Comments)    Severe stomach cramping  . Leflunomide Other (See Comments)    Fatigue/anxiety  . Rynatan [Chlorpheniramine-Phenyleph Er] Other (See Comments)    Stomach ache   . Statins Other (See Comments)    Myalgias.  . Tramadol  Nausea And Vomiting  . Keflet [Cephalexin] Rash    Severe stomach cramping  . Penicillins Rash  . Pneumococcal Polysaccharide Vaccine Rash    All over body  . Sulfa Antibiotics Rash    No medications prior to admission.    No results found for this or any previous visit (from the past 48 hour(s)). No results found.  Review of Systems  All other systems reviewed and are negative.   There were no vitals taken for this visit. Physical Exam  Patient is alert, oriented, no adenopathy, well-dressed, normal affect, normal respiratory effort. Examination patient has massive pitting edema there is no cellulitis in the right foot or leg.  I cannot palpate a pulse the Doppler was used and she has a biphasic dorsalis pedis and posterior tibial pulse with no clinical evidence of arterial insufficiency.  She has a 2 cm necrotic ulcer over the right Achilles with necrotic Achilles tendon. Assessment/Plan 1. 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 Cellulitis of right lower extremity   3. Ankle ulcer, right, with necrosis of muscle (HCC)     Plan: We will plan for surgical intervention with debridement of the necrotic Achilles debridement of the wound possible placement of skin graft with a cleanse choice Prevena wound VAC.  Plan for overnight observation and discharge with the wound VAC.  Risks and benefits were discussed including risk of the  wound not healing need for additional surgery.   Newt Minion, MD 08/15/2019, 8:46 AM

## 2019-08-15 NOTE — Progress Notes (Signed)
Pharmacy Antibiotic Note  Kaitlin Hudson is a 64 y.o. female with diabetic neuropathy and venous insufficiency (massive pitting edema of both lower extremities) admitted on 08/15/2019 with painful R Achilles ulcer/cellulitis. Pt is S/P excisional debridement of skin/soft tissue/muscle/tendon of R Achilles ulcer, with split thickness skin graft this afternoon. Pharmacy has been consulted for vancomycin dosing.  WBC 10.2, afebrile, Scr 1.30, CrCl 45.4 ml/min (renal function ~stable, per available Scr values in Epic)  Plan: Vancomycin 1750 mg IV X 1, followed by vancomycin 1250 mg IV Q 24 hrs (goal vancomycin trough: 15-20 mg/L) Monitor WBC, temp, clinical improvement, cultures, renal function, vancomycin trough as indicated  Height: 5\' 4"  (162.6 cm) Weight: 84.8 kg (187 lb) IBW/kg (Calculated) : 54.7  Temp (24hrs), Avg:97.6 F (36.4 C), Min:96.6 F (35.9 C), Max:98.2 F (36.8 C)  Recent Labs  Lab 08/15/19 1237  WBC 10.2  CREATININE 1.30*    Estimated Creatinine Clearance: 45.4 mL/min (A) (by C-G formula based on SCr of 1.3 mg/dL (H)).    Allergies  Allergen Reactions  . Aspirin Other (See Comments)    Nose bleed  . Latex Rash  . Other Rash    VINYL   . Cimzia [Certolizumab Pegol] Other (See Comments)    bleeding  . Clindamycin/Lincomycin Diarrhea  . Diclofenac Nausea Only  . Enbrel [Etanercept] Hives  . Erythromycin Other (See Comments)    Severe stomach cramping  . Leflunomide Other (See Comments)    Fatigue/anxiety  . Rynatan [Chlorpheniramine-Phenyleph Er] Other (See Comments)    Stomach ache   . Statins Other (See Comments)    Myalgias.  . Tramadol Nausea And Vomiting  . Keflet [Cephalexin] Rash    Severe stomach cramping  . Penicillins Rash  . Pneumococcal Polysaccharide Vaccine Rash    All over body  . Sulfa Antibiotics Rash    Microbiology results: 6/2 L foot surgical cx:  Gram stain: rare WBC (PMNs), abundant GPR, few GPC, rate gram variable rod; cx  pending 6/2 MRSA PCR: negative 6/2 COVID: negative  Thank you for allowing pharmacy to be a part of this patient's care.  8/2, PharmD, BCPS, [facility] Clinical Pharmacist 08/15/2019 7:36 PM

## 2019-08-15 NOTE — Progress Notes (Signed)
ANTICOAGULATION CONSULT NOTE - Initial Consult  Pharmacy Consult for Enoxaparin Indication: VTE prophylaxis  Allergies  Allergen Reactions  . Aspirin Other (See Comments)    Nose bleed  . Latex Rash  . Other Rash    VINYL   . Cimzia [Certolizumab Pegol] Other (See Comments)    bleeding  . Clindamycin/Lincomycin Diarrhea  . Diclofenac Nausea Only  . Enbrel [Etanercept] Hives  . Erythromycin Other (See Comments)    Severe stomach cramping  . Leflunomide Other (See Comments)    Fatigue/anxiety  . Rynatan [Chlorpheniramine-Phenyleph Er] Other (See Comments)    Stomach ache   . Statins Other (See Comments)    Myalgias.  . Tramadol Nausea And Vomiting  . Keflet [Cephalexin] Rash    Severe stomach cramping  . Penicillins Rash  . Pneumococcal Polysaccharide Vaccine Rash    All over body  . Sulfa Antibiotics Rash    Patient Measurements: Height: 5\' 4"  (162.6 cm) Weight: 84.8 kg (187 lb) IBW/kg (Calculated) : 54.7  Vital Signs: Temp: 97.9 F (36.6 C) (06/02 1959) Temp Source: Oral (06/02 1959) BP: 128/58 (06/02 1959) Pulse Rate: 91 (06/02 1959)  Labs: Recent Labs    08/15/19 1237  HGB 13.5  HCT 43.0  PLT 238  CREATININE 1.30*    Estimated Creatinine Clearance: 45.4 mL/min (A) (by C-G formula based on SCr of 1.3 mg/dL (H)).   Medical History: Past Medical History:  Diagnosis Date  . Arthritis, rheumatoid (HCC)   . CHF (congestive heart failure) (HCC)   . Diabetes mellitus without complication (HCC)   . Hyperlipidemia   . Hypertension   . Latent tuberculosis   . Type 1 diabetes [facility])     Assessment: 64 yr old female admitted on 6/2/221 was admitted on 08/15/2019 with painful R Achilles ulcer/cellulitis. Pt is S/P excisional debridement of skin/soft tissue/muscle/tendon of R Achilles ulcer, with split thickness skin graft. Pharmacy was consulted for enoxaparin dosing for VTE prophylaxis.  CBC WNL; Scr 1.30, TBW CrCl ~58 ml/min (renal function ~stable, per  available Scr values in Epic)  Goal of Therapy:  Prevention of VTE after surgery Monitor platelets by anticoagulation protocol: Yes   Plan:  Enoxaparin 40 mg SQ daily, starting ~1200 on 08/16/19 Monitor CBC Monitor for signs/symptoms of bleeding  10/16/19, PharmD, BCPS, [facility] Clinical Pharmacist 08/15/2019,8:27 PM

## 2019-08-15 NOTE — Anesthesia Procedure Notes (Signed)
Procedure Name: LMA Insertion Date/Time: 08/15/2019 4:53 PM Performed by: Adonis Housekeeper, CRNA Pre-anesthesia Checklist: Patient identified, Emergency Drugs available, Suction available and Patient being monitored Patient Re-evaluated:Patient Re-evaluated prior to induction Oxygen Delivery Method: Circle system utilized Preoxygenation: Pre-oxygenation with 100% oxygen Induction Type: IV induction Ventilation: Mask ventilation without difficulty LMA: LMA inserted LMA Size: 4.0 Number of attempts: 1 Placement Confirmation: positive ETCO2 and breath sounds checked- equal and bilateral Tube secured with: Tape Dental Injury: Teeth and Oropharynx as per pre-operative assessment

## 2019-08-15 NOTE — Op Note (Signed)
08/15/2019  5:31 PM  PATIENT:  Kaitlin Hudson    PRE-OPERATIVE DIAGNOSIS:  Necrotic Right Achilles Ulcer  POST-OPERATIVE DIAGNOSIS:  Same  PROCEDURE:  RIGHT ACHILLES EXCISIONAL DEBRIDEMENT of skin and soft tissue muscle and tendon, split thickness SKIN GRAFT with allograft skin graft 387 cm. Application of cleanse choice wound VAC.  SURGEON:  Nadara Mustard, MD  PHYSICIAN ASSISTANT:None ANESTHESIA:   General  PREOPERATIVE INDICATIONS:  [name] is a  64 y.o. female with a diagnosis of Necrotic Right Achilles Ulcer who failed conservative measures and elected for surgical management.    The risks benefits and alternatives were discussed with the patient preoperatively including but not limited to the risks of infection, bleeding, nerve injury, cardiopulmonary complications, the need for revision surgery, among others, and the patient was willing to proceed.  OPERATIVE IMPLANTS: Split-thickness skin graft 387 cm with cleanse choice wound VAC  @ENCIMAGES @  OPERATIVE FINDINGS: Large area of necrotic Achilles tendon.  Tissue sent for cultures.  OPERATIVE PROCEDURE: Patient brought the operating room and underwent a general anesthetic.  After adequate levels anesthesia were obtained patient's right lower extremity was prepped using DuraPrep draped into a sterile field a timeout was called.  An elliptical incision was made around the ulcerative tissue and this left a incision that was 15 cm x 5 cm.  Approximately half of the Achilles tendon and muscle were excised sharply with a 10 blade knife.  There was extensive necrotic areas of Achilles tendon.  This was irrigated with normal saline.  Split-thickness skin graft was then applied in multiple layers 387 cm.  A cleanse choice wound VAC was applied this had a good suction fit this was overwrapped with Coban patient was extubated taken the PACU in stable condition.   DISCHARGE PLANNING:  Antibiotic duration: Vancomycin started  postoperatively.  Antibiotics were held preoperatively in order to obtain tissue cultures  Weightbearing: Weightbearing as tolerated with a PRAFO boot  Pain medication: Opioid pathway  Dressing care/ Wound VAC: Continue wound VAC for 1 week  Ambulatory devices: Walker  Discharge to: Anticipate discharge to skilled nursing.  Follow-up: In the office 1 week post operative.

## 2019-08-15 NOTE — Plan of Care (Signed)

## 2019-08-15 NOTE — Progress Notes (Signed)
DOS: Patient is on insulin pump  Patient still has pump on and receiving usual dose of Humalog.

## 2019-08-15 NOTE — Progress Notes (Signed)
Orthopedic Tech Progress Note Patient Details:  Kaitlin Hudson November 15, 1955 [id number]  Ortho Devices Type of Ortho Device: Prafo boot/shoe Ortho Device/Splint Location: RLE Ortho Device/Splint Interventions: Application   Post Interventions Patient Tolerated: Well Instructions Provided: Adjustment of device   Pope Brunty E Jaylenne Hamelin 08/15/2019, 8:37 PM

## 2019-08-15 NOTE — Progress Notes (Signed)
Inpatient Diabetes Program Recommendations  AACE/ADA: New Consensus Statement on Inpatient Glycemic Control (2015)  Target Ranges:  Prepandial:   less than 140 mg/dL      Peak postprandial:   less than 180 mg/dL (1-2 hours)      Critically ill patients:  140 - 180 mg/dL   Lab Results  Component Value Date   GLUCAP 138 (H) 08/15/2019    Review of Glycemic Control  Diabetes history:  DM1(does not make insulin.  Needs correction, basal and meal coverage)  Outpatient Diabetes medications:  Humalog via insulin pump  Novolin N 8-10 units daily while on prednisone  Current orders for Inpatient glycemic control:  None  Inpatient Diabetes Program Recommendations:     If MD would like to keep patient on insulin pump, please consider insulin pump order set with cbg's ac/hs and 2am  Thank you, Dulce Sellar, RN, BSN Diabetes Coordinator Inpatient Diabetes Program [phone] (team pager from 8a-5p)

## 2019-08-16 ENCOUNTER — Encounter: Payer: Self-pay | Admitting: *Deleted

## 2019-08-16 LAB — GLUCOSE, CAPILLARY
Glucose-Capillary: 90 mg/dL (ref 70–99)
Glucose-Capillary: 105 mg/dL — ABNORMAL HIGH (ref 70–99)
Glucose-Capillary: 66 mg/dL — ABNORMAL LOW (ref 70–99)
Glucose-Capillary: 76 mg/dL (ref 70–99)
Glucose-Capillary: 155 mg/dL — ABNORMAL HIGH (ref 70–99)

## 2019-08-16 LAB — PROTIME-INR
INR: 1 (ref 0.8–1.2)
Prothrombin Time: 13.1 seconds (ref 11.4–15.2)

## 2019-08-16 MED ORDER — INSULIN PUMP
Freq: Three times a day (TID) | SUBCUTANEOUS | Status: DC
  Administered 2019-08-17: 13 via SUBCUTANEOUS
  Filled 2019-08-16: qty 1

## 2019-08-16 MED ORDER — PANTOPRAZOLE SODIUM 40 MG PO TBEC
40.0000 mg | DELAYED_RELEASE_TABLET | Freq: Once | ORAL | Status: AC
  Administered 2019-08-16: 40 mg via ORAL
  Filled 2019-08-16: qty 1

## 2019-08-16 NOTE — Progress Notes (Signed)
Pt c/o pain to left lower rib cage, (pt has some of this pain this am, after vomitting, stating "If I could just burp, this pain would go away", pt was given zofran this am to help with nausea, declined pain medication.  Currently at 1807, pt called out having this pain in same area, left lower rib cage, sharp, 10/10, denies SOB, sats 96-100% on RA, anxious, (husband at bedside).  Pt states she's had the pain all day, but has not expressed this to the nurse except for this morning around 0900.  Pt has only ate few bites of food today, sipped on water and sprite zero. Glucose dropped this evening to 66, was able to get pt to drink some ensure 120cc approx (refused any kind of juice).  Glucose up to 76.  Pt states she has passed some gas today, bowel sounds are present but hypo,  Pt has not eat or drank much today.  Pt up in recliner all day, where she is comfortable with her foot.  Has refused po meds except for the po zofran given to her this am.  Pt remains in the recliner, and refuses to lay back in bed.   Pt given 1mg  IV dilaudid and IV zofran at this time of 1807,  Pt able to settle down for only a few minutes, husband spoke up and said "I think the medicine is helping, then pt c/o of pain still there".    Call placed to on call MD, Dr , informed him of the above.  Ordered to give pt IV reglan 10mg  and po protonix 40mg  x 1 to aide in relieving  GI symptoms. (pt had an existing order for IV reglan---will not write an order for this 1 dose).    Will inform pt of orders and give medications.

## 2019-08-16 NOTE — Progress Notes (Signed)
Inpatient Diabetes Program Recommendations  AACE/ADA: New Consensus Statement on Inpatient Glycemic Control (2015)  Target Ranges:  Prepandial:   less than 140 mg/dL      Peak postprandial:   less than 180 mg/dL (1-2 hours)      Critically ill patients:  140 - 180 mg/dL   Lab Results  Component Value Date   GLUCAP 105 (H) 08/16/2019   HGBA1C 7.2 (H) 08/15/2019    Review of Glycemic Control Results for Kaitlin Hudson, Kaitlin Hudson (MRN [id number]) as of 08/16/2019 12:56  Ref. Range 08/15/2019 20:48 08/16/2019 07:46 08/16/2019 11:16  Glucose-Capillary Latest Ref Range: 70 - 99 mg/dL 235 (H) 90 361 (H)   Diabetes history: Type 1 DM Outpatient Diabetes medications: insulin pump, NPH 10 units BID (in addition to insulin pump) Current orders for Inpatient glycemic control: insulin pump Novolog 6 units TID, Novolog 0-20 units TID Prednisone 10 mg QAM, 5 mg QPM  Inpatient Diabetes Program Recommendations:    Consider adding insulin pump order set and discontinuing Novolog 6 units TID. Secure chat sent to MD and discussed with RN. Orders updated.  Spoke with patient regarding outpatient diabetes management. Verified that [employer] insulin pump applied and running. She is managing her carb coverage as well. Only uses NPH with Prednisone, has not used while inpatient or adjusted pump's basal settings to alott for difference. Patient is followed closely by Dr Allena Katz, outpatient endocrinology and has upcoming appointment.  Reviewed patient's current A1c of 7.2%. Explained what a A1c is and what it measures. Also reviewed goal A1c with patient, importance of good glucose control @ home, and blood sugar goals.    Current insulin pump settings are as follows:  Basal insulin Total daily basal insulin: 29.15 units/24 hours  Carb Coverage- Uses Sliding scale based on consumed carbohydrates (scale is "in patient's head). However, average bolus dose per meal was 14 units.    Patient verbalized understanding of  information discussed and states that he does not have any further questions related to diabetes at this time.  NURSING: Once insulin pump order set is ordered please print off the Patient insulin pump contract and flow sheet. The insulin pump contract should be signed by the patient and then placed in the chart. The patient insulin pump flow sheet will be completed by the patient at the bedside and the RN caring for the patient will use the patient's flow sheet to document in the [facility]. RN will need to complete the Nursing Insulin Pump Flowsheet at least once a shift. Patient will need to keep extra insulin pump supplies at the bedside at all times.   Thanks, Lujean Rave, MSN, RNC-OB Diabetes Coordinator [phone] (8a-5p)

## 2019-08-16 NOTE — Evaluation (Signed)
Physical Therapy Evaluation Patient Details Name: Kaitlin Hudson MRN: [id number] DOB: 03/02/1956 Today's Date: 08/16/2019   History of Present Illness  63 yo female s/p R achilles excisional debridement, skin grafting, and wound vac placement on 08/15/19. PMH includes DM I with neuropathy, venous insufficiency, pitting edema LEs, CHF, HLD, HTN.  Clinical Impression   Pt presents with RLE pain especially in WB, generalized weakness, impaired sitting and standing balance with history of falls, impaired gait, and decreased activity tolerance. Pt to benefit from acute PT to address deficits. Pt ambulated very short room distance with multimodal cuing for form and safety during gait, pt with particular difficulty maneuvering with PRAFO boot donned. Pt very tearful during session about surgery and possibility of amputation pending culture results, PT offered encouragement and motivation during session. PT recommending SNF originally, pt declines recommendation so PT recommending HHPT with 24/7 assist from husband and other family members as needed. PT to progress mobility as tolerated, and will continue to follow acutely.      Follow Up Recommendations Home health PT;Supervision/Assistance - 24 hour(PT original recommendation SNF, pt declines so PT recommending HHPT and 24/7)    Equipment Recommendations  Rolling walker with 5" wheels;3in1 (PT)    Recommendations for Other Services       Precautions / Restrictions Precautions Precautions: Fall Restrictions Weight Bearing Restrictions: No RLE Weight Bearing: Weight bearing as tolerated Other Position/Activity Restrictions: WBAT in PRAFO during gait      Mobility  Bed Mobility Overal bed mobility: Needs Assistance             General bed mobility comments: up in chair upon PT arrival, requests stay in chair upon PT exit.  Transfers Overall transfer level: Needs assistance Equipment used: Rolling walker (2 wheeled) Transfers: Sit  to/from Stand Sit to Stand: Min assist;From elevated surface         General transfer comment: Min assist for power up, hip extension, and steadying upon standing. Verbal cuing for hand placement when rising  Ambulation/Gait Ambulation/Gait assistance: Min assist Gait Distance (Feet): 5 Feet Assistive device: Rolling walker (2 wheeled) Gait Pattern/deviations: Step-to pattern;Decreased stride length;Decreased step length - right;Decreased weight shift to right;Antalgic;Trunk flexed Gait velocity: decr   General Gait Details: Min assist to steady, verbal cuing for sequencing with RLE leading and step-to gait for pt comfort, placement in RW, upright posture. Pt limited by nausea and pain in distance.  Stairs            Wheelchair Mobility    Modified Rankin (Stroke Patients Only)       Balance Overall balance assessment: Needs assistance;History of Falls Sitting-balance support: No upper extremity supported;Feet supported Sitting balance-Leahy Scale: Fair     Standing balance support: Bilateral upper extremity supported Standing balance-Leahy Scale: Poor Standing balance comment: reliant on external support                             Pertinent Vitals/Pain Pain Assessment: 0-10 Pain Score: 5  Pain Location: R heelcord Pain Descriptors / Indicators: Sore;Aching;Burning Pain Intervention(s): Limited activity within patient's tolerance;Monitored during session;Repositioned;Premedicated before session    Home Living Family/patient expects to be discharged to:: Private residence Living Arrangements: Spouse/significant other Available Help at Discharge: Family;Available 24 hours/day(husband had back surgery a couple of years ago, light physical assist only) Type of Home: House Home Access: Stairs to enter   [employer] of Steps: 1 Home Layout: One level Home Equipment: Environmental consultant -  4 wheels;Cane - single point;Transport chair      Prior Function  Level of Independence: Needs assistance   Gait / Transfers Assistance Needed: Pt reports walking with cane PTA, x1 fall over the past year due to R foot dysfunction  ADL's / Homemaking Assistance Needed: Pt reports dressing, bathing, and cleaning without assist of husband; states her husband is the cook        Hand Dominance   Dominant Hand: Right    Extremity/Trunk Assessment   Upper Extremity Assessment Upper Extremity Assessment: Defer to OT evaluation    Lower Extremity Assessment Lower Extremity Assessment: Generalized weakness;RLE deficits/detail RLE Deficits / Details: Tightness in knee extension lacking ~10* extension with empty end feel due to pain; able to perform toe flex/ext RLE: Unable to fully assess due to pain    Cervical / Trunk Assessment Cervical / Trunk Assessment: Normal  Communication   Communication: No difficulties  Cognition Arousal/Alertness: Awake/alert Behavior During Therapy: WFL for tasks assessed/performed Overall Cognitive Status: Within Functional Limits for tasks assessed                                 General Comments: Tearful during session, states she doesn't know if she will be able to keep her foot and she has worked hard her whole life to maintain LE skin integrity      General Comments      Exercises     Assessment/Plan    PT Assessment Patient needs continued PT services  PT Problem List Decreased strength;Decreased mobility;Decreased safety awareness;Decreased range of motion;Decreased knowledge of precautions;Decreased activity tolerance;Decreased balance;Decreased knowledge of use of DME;Pain       PT Treatment Interventions DME instruction;Therapeutic activities;Therapeutic exercise;Gait training;Patient/family education;Balance training;Stair training;Functional mobility training;Neuromuscular re-education    PT Goals (Current goals can be found in the Care Plan section)  Acute Rehab PT Goals Patient  Stated Goal: go home - I will not go to rehab PT Goal Formulation: With patient Time For Goal Achievement: 08/30/19 Potential to Achieve Goals: Good    Frequency Min 3X/week   Barriers to discharge        Co-evaluation               AM-PAC PT "6 Clicks" Mobility  Outcome Measure Help needed turning from your back to your side while in a flat bed without using bedrails?: A Little Help needed moving from lying on your back to sitting on the side of a flat bed without using bedrails?: A Little Help needed moving to and from a bed to a chair (including a wheelchair)?: A Little Help needed standing up from a chair using your arms (e.g., wheelchair or bedside chair)?: A Little Help needed to walk in hospital room?: A Little Help needed climbing 3-5 steps with a railing? : A Lot 6 Click Score: 17    End of Session Equipment Utilized During Treatment: Gait belt;Other (comment)(PRAFO on RLE when ambulating) Activity Tolerance: Patient limited by fatigue;Patient limited by pain Patient left: in chair;with chair alarm set;with call bell/phone within reach Nurse Communication: Mobility status PT Visit Diagnosis: Other abnormalities of gait and mobility (R26.89);Muscle weakness (generalized) (M62.81);Difficulty in walking, not elsewhere classified (R26.2)    Time: 4235-3614 PT Time Calculation (min) (ACUTE ONLY): 35 min   Charges:   PT Evaluation $PT Eval Low Complexity: 1 Low PT Treatments $Therapeutic Activity: 8-22 mins       Montoya Brandel E, PT  Acute Rehabilitation Services Pager [phone]  Office [phone]  Tyrone Apple D Despina Hidden 08/16/2019, 11:45 AM

## 2019-08-16 NOTE — Anesthesia Postprocedure Evaluation (Signed)
Anesthesia Post Note  Patient: Kaitlin Hudson  Procedure(s) Performed: RIGHT ACHILLES DEBRIDEMENT, POSSIBLE SKIN GRAFT (Right Ankle)     Patient location during evaluation: PACU Anesthesia Type: General Level of consciousness: awake and sedated Pain management: pain level controlled Vital Signs Assessment: post-procedure vital signs reviewed and stable Respiratory status: spontaneous breathing Cardiovascular status: stable Postop Assessment: no apparent nausea or vomiting Anesthetic complications: no    Last Vitals:  Vitals:   08/16/19 0429 08/16/19 0751  BP: (!) 125/55 (!) 145/65  Pulse: 100 93  Resp: 16 17  Temp: 36.7 C 36.8 C  SpO2: 95% 97%    Last Pain:  Vitals:   08/16/19 0751  TempSrc: Oral  PainSc:    Pain Goal: Patients Stated Pain Goal: 1 (08/16/19 0331)                 Caren Macadam

## 2019-08-16 NOTE — Progress Notes (Signed)
Pt was given reglan and protonix at 1851. Pt was reluctant to take the protonix, afraid of vomiting it back up, reminded pt she has had zofran and now reglan to help prevent it.    At 1920, pt asleep in recliner, husband at bedside.

## 2019-08-16 NOTE — Progress Notes (Signed)
Hypoglycemic Event  CBG: 66  Treatment: 4 oz juice/soda, refused juice, given ensure   Symptoms: None  Follow-up CBG: Time:1641 CBG Result:76  Possible Reasons for Event: Inadequate meal intake  Comments/MD notified:pt encouraged to eat/drink, pt managing her insulin pump and is not giving more insulin. (informed Dr Magnus Ivan at [phone] when he was called for pt's c/o pain to left lower rib cage)     Buford Dresser, Latrecia Capito E

## 2019-08-16 NOTE — Progress Notes (Signed)
Patient ID: Kaitlin Hudson, female   DOB: 1955/08/01, 64 y.o.   MRN: [id number] Patient is status post debridement of large necrotic ulcer over the Achilles.  Gram stain is positive for gram-positive cocci.  There is 250 cc of clear serosanguineous fluid in the wound VAC canister.  Patient has multiple allergies she is currently on vancomycin anticipate patient will need 6 weeks of IV antibiotics.  Anticipate discharge to skilled nursing.  Have recommended patient wear the PRAFO to unload pressure from the skin graft over the Achilles.  Patient states that it was too uncomfortable.

## 2019-08-17 ENCOUNTER — Inpatient Hospital Stay: Payer: Self-pay

## 2019-08-17 LAB — GLUCOSE, CAPILLARY
Glucose-Capillary: 193 mg/dL — ABNORMAL HIGH (ref 70–99)
Glucose-Capillary: 55 mg/dL — ABNORMAL LOW (ref 70–99)
Glucose-Capillary: 83 mg/dL (ref 70–99)
Glucose-Capillary: 131 mg/dL — ABNORMAL HIGH (ref 70–99)
Glucose-Capillary: 57 mg/dL — ABNORMAL LOW (ref 70–99)
Glucose-Capillary: 57 mg/dL — ABNORMAL LOW (ref 70–99)
Glucose-Capillary: 84 mg/dL (ref 70–99)

## 2019-08-17 LAB — PROTIME-INR
INR: 1 (ref 0.8–1.2)
Prothrombin Time: 13 seconds (ref 11.4–15.2)

## 2019-08-17 MED ORDER — SODIUM CHLORIDE 0.9% FLUSH: 10.0000 mL | INTRAVENOUS | Status: DC | PRN

## 2019-08-17 MED ORDER — SODIUM CHLORIDE 0.9% FLUSH
10.0000 mL | Freq: Two times a day (BID) | INTRAVENOUS | Status: DC
  Administered 2019-08-17 – 2019-08-20 (×2): 10 mL

## 2019-08-17 NOTE — Progress Notes (Signed)
Hypoglycemic Event  CBG: 55  Treatment: Coke  Symptoms: Fatigue  Follow-up CBG: Time:1246 CBG Result:83  Possible Reasons for Event: Poor po intake  Comments/MD notified: Yes, notified diabetic coordinator.     Andrey Spearman Laycee Fitzsimmons RN

## 2019-08-17 NOTE — Progress Notes (Signed)
PT Cancellation Note  Patient Details Name: Kaitlin Hudson MRN: [id number] DOB: 1955-10-17   Cancelled Treatment:    Reason Eval/Treat Not Completed: Medical issues which prohibited therapy. Upon PT arrival, NT received blood glucose reading of 55, and asked PT to hold until the pt could eat in order to raise blood glucose to level safe for activity. PT will continue to follow as time/schedule allow.   Rolm Baptise, PT, DPT   Acute Rehabilitation Department Pager #: [phone]   Gaetana Michaelis 08/17/2019, 1:56 PM

## 2019-08-17 NOTE — Progress Notes (Signed)
Inpatient Diabetes Program Recommendations  AACE/ADA: New Consensus Statement on Inpatient Glycemic Control (2015)  Target Ranges:  Prepandial:   less than 140 mg/dL      Peak postprandial:   less than 180 mg/dL (1-2 hours)      Critically ill patients:  140 - 180 mg/dL   Lab Results  Component Value Date   GLUCAP 83 08/17/2019   HGBA1C 7.2 (H) 08/15/2019    Review of Glycemic Control Results for BAILYNN, DYK (MRN [id number]) as of 08/17/2019 14:59  Ref. Range 08/16/2019 16:41 08/16/2019 20:50 08/17/2019 07:39 08/17/2019 12:17 08/17/2019 12:46  Glucose-Capillary Latest Ref Range: 70 - 99 mg/dL 76 898 (H) 421 (H) 55 (L) 83    Note: Spoke with patient at bedside after hypoglycemic episode of 55 mg/dl.  She bolused for carbs at breakfast and covered for prednisone.  She was low at noon.  Encouraged patient to only bolus for carbs after eating and to correct at the next meal for prednisone.  Patient does state she has frequent lows at home.   Will continue to follow while inpatient.  Thank you, Dulce Sellar, RN, BSN Diabetes Coordinator Inpatient Diabetes Program [phone] (team pager from 8a-5p)

## 2019-08-17 NOTE — Progress Notes (Signed)
Patient ID: Kaitlin Hudson, female   DOB: 05/13/1955, 64 y.o.   MRN: [id number] Patient states she feels much better this morning.  States that her rib pain is feeling better on the left she is status post nausea and vomiting from yesterday.  Decreased drainage in the wound VAC canister a total of 350 cc.  Cultures are positive with sensitivities pending.  Orders written for a PICC line and anticipate for discharge to skilled nursing with IV antibiotics.

## 2019-08-17 NOTE — Progress Notes (Signed)
Physical Therapy Treatment Patient Details Name: Kaitlin Hudson MRN: [id number] DOB: 1955/05/07 Today's Date: 08/17/2019    History of Present Illness 64 yo female s/p R achilles excisional debridement, skin grafting, and wound vac placement on 08/15/19. PMH includes DM I with neuropathy, venous insufficiency, pitting edema LEs, CHF, HLD, HTN.    PT Comments    Pt OOB in recliner upon arrival of PT, agreeable to session with focus on progressing functional endurance and ambulation. The pt was able to demo improvement in transfers with RW needing only minG for safety rather than physical assist today. Furthermore, the pt also increased ambulation endurance with short bout of ambulation into hall, but continues to ambulate with short, choppy gait and significant reliance on BUE. The pt will continue to benefit from skilled PT to further maximize functional strength, power, endurance, and stability prior to d/c home to improve safety and independence.     Follow Up Recommendations  Home health PT;Supervision/Assistance - 24 hour     Equipment Recommendations  Rolling walker with 5" wheels;3in1 (PT)    Recommendations for Other Services       Precautions / Restrictions Precautions Precautions: Fall Restrictions Weight Bearing Restrictions: Yes RLE Weight Bearing: Weight bearing as tolerated Other Position/Activity Restrictions: WBAT in PRAFO during gait    Mobility  Bed Mobility Overal bed mobility: Needs Assistance             General bed mobility comments: up in chair upon PT arrival, requests stay in chair upon PT exit.  Transfers Overall transfer level: Needs assistance Equipment used: Rolling walker (2 wheeled) Transfers: Sit to/from Stand Sit to Stand: Min guard         General transfer comment: minG for safety, VC for hand positioning. Pt better with stand, increased cues for good eccentric lower. cues for hand placement with sit  Ambulation/Gait Ambulation/Gait  assistance: Min assist Gait Distance (Feet): 20 Feet Assistive device: Rolling walker (2 wheeled) Gait Pattern/deviations: Step-to pattern;Decreased stride length;Decreased step length - right;Decreased weight shift to right;Antalgic;Trunk flexed Gait velocity: decr Gait velocity interpretation: <1.31 ft/sec, indicative of household ambulator General Gait Details: minG for safety, slow gait with significantly shortened steps bilaterally despite cues   Stairs             Wheelchair Mobility    Modified Rankin (Stroke Patients Only)       Balance Overall balance assessment: Needs assistance;History of Falls Sitting-balance support: No upper extremity supported;Feet supported Sitting balance-Leahy Scale: Fair     Standing balance support: Bilateral upper extremity supported Standing balance-Leahy Scale: Fair Standing balance comment: able to static stand without assist, BUE support for ambulation                            Cognition Arousal/Alertness: Awake/alert Behavior During Therapy: WFL for tasks assessed/performed Overall Cognitive Status: Within Functional Limits for tasks assessed                                 General Comments: Pt agreeable and cooperative through session. Pt able to demo good awareness of lines and fatigue      Exercises General Exercises - Lower Extremity Long Arc Quad: AROM;Both;10 reps;Seated Hip Flexion/Marching: AROM;Both;10 reps;Seated Heel Raises: AROM;Left;10 reps;Seated    General Comments General comments (skin integrity, edema, etc.): HR to 122 following ambulation, returned to 90s with seated rest  Pertinent Vitals/Pain Pain Assessment: Faces Faces Pain Scale: Hurts a little bit Pain Location: R heelcord Pain Descriptors / Indicators: Sore;Aching;Burning Pain Intervention(s): Limited activity within patient's tolerance;Monitored during session;Repositioned    Home Living                       Prior Function            PT Goals (current goals can now be found in the care plan section) Acute Rehab PT Goals Patient Stated Goal: go home PT Goal Formulation: With patient Time For Goal Achievement: 08/30/19 Potential to Achieve Goals: Good Progress towards PT goals: Progressing toward goals    Frequency    Min 3X/week      PT Plan Current plan remains appropriate    Co-evaluation              AM-PAC PT "6 Clicks" Mobility   Outcome Measure  Help needed turning from your back to your side while in a flat bed without using bedrails?: A Little Help needed moving from lying on your back to sitting on the side of a flat bed without using bedrails?: A Little Help needed moving to and from a bed to a chair (including a wheelchair)?: A Little Help needed standing up from a chair using your arms (e.g., wheelchair or bedside chair)?: A Little Help needed to walk in hospital room?: A Little Help needed climbing 3-5 steps with a railing? : A Lot 6 Click Score: 17    End of Session Equipment Utilized During Treatment: Gait belt;Other (comment)(PRAFO on RLE when walking) Activity Tolerance: Patient limited by fatigue;Patient limited by pain Patient left: in chair;with chair alarm set;with call bell/phone within reach Nurse Communication: Mobility status PT Visit Diagnosis: Other abnormalities of gait and mobility (R26.89);Muscle weakness (generalized) (M62.81);Difficulty in walking, not elsewhere classified (R26.2)     Time: 0254-2706 PT Time Calculation (min) (ACUTE ONLY): 23 min  Charges:  $Gait Training: 8-22 mins $Therapeutic Exercise: 8-22 mins                     Rolm Baptise, PT, DPT   Acute Rehabilitation Department Pager #: [phone]   Gaetana Michaelis 08/17/2019, 5:00 PM

## 2019-08-17 NOTE — TOC Initial Note (Signed)
Transition of Care [facility]) - Initial/Assessment Note    Patient Details  Name: Kaitlin Hudson MRN: [id number] Date of Birth: [birth year]  Transition of Care [facility]) CM/SW Contact:    Curlene Labrum, RN Phone Number: 08/17/2019, 11:48 AM  Clinical Narrative:                 Case management met with the patient at bedside - S/P Right achilles excisional debridement and skin grafting per Dr. Sharol Given on 6/2.  Patient is a type I diabetic with insulin pump from home with her husband.  Patient refusing SNF placement and would like to go home with home health services.  Patient given medicare choice regarding home health and dme services and patient does not have a preference.  Patient is planning for discharge for Monday, June 08/20/19.  [employer] was called to set up wheelchair, RW and 3:1.  Alvis Lemmings was called for home health PT/OT/RN.  [facility] called for IV infusion - spoke with Carolynn Sayers, RNCM.  Will continue to follow for needs and plan to discharge on Monday, June 6/7.  Expected Discharge Plan: [place]     Patient Goals and CMS Choice Patient states their goals for this hospitalization and ongoing recovery are:: I want to be able to go home with home health and not a facility. CMS Medicare.gov Compare Post Acute Care list provided to:: Patient Choice offered to / list presented to : Patient  Expected Discharge Plan and Services Expected Discharge Plan: [place]   Discharge Planning Services: CM Consult Post Acute Care Choice: Durable Medical Equipment Living arrangements for the past 2 months: Single Family Home                 DME Arranged: 3-N-1, Walker rolling, Wheelchair manual DME Agency: [facility] Date DME Agency Contacted: 08/17/19 Time DME Agency Contacted: [id number] Representative spoke with at DME Agency: Thedore Mins, [facility] called for dme HH Arranged: RN, PT, OT, IV Antibiotics HH Agency: [facility], [facility](RN/PT/OT with [facility] Lemmings, IV  antibiotics for 6 weeks - [facility] - Pam Chander) Date HH Agency Contacted: 08/17/19 Time [place]: [id number] Representative spoke with at [place], [place] with Alvis Lemmings for [facility] PT/OT/RN - Left message with Carolynn Sayers, RNCM with [facility] to follow for IV antibiotics for 6 weeks via PICC  Prior Living Arrangements/Services Living arrangements for the past 2 months: Single Family Home Lives with:: Spouse Patient language and need for interpreter reviewed:: Yes Do you feel safe going back to the place where you live?: Yes      Need for Family Participation in Patient Care: Yes (Comment) Care giver support system in place?: Yes (comment) Current home services: (Patient is DM type I with insulin pump from home) Criminal Activity/Legal Involvement Pertinent to Current Situation/Hospitalization: No - Comment as needed  Activities of Daily Living Home Assistive Devices/Equipment: Cane (specify quad or straight), CBG Meter, Eyeglasses ADL Screening (condition at time of admission) Patient's cognitive ability adequate to safely complete daily activities?: Yes Is the patient deaf or have difficulty hearing?: No Does the patient have difficulty seeing, even when wearing glasses/contacts?: No Does the patient have difficulty concentrating, remembering, or making decisions?: No Patient able to express need for assistance with ADLs?: Yes Does the patient have difficulty dressing or bathing?: No Independently performs ADLs?: Yes (appropriate for developmental age) Does the patient have difficulty walking or climbing stairs?: Yes Weakness of Legs: Right Weakness of Arms/Hands: Both  Permission Sought/Granted Permission sought to share information with : Case Manager Permission granted to share information with : Yes, Verbal Permission Granted     Permission granted to share info w AGENCY: Lindie Spruce,  [place] for IV antibiotics.        Emotional  Assessment Appearance:: Appears stated age Attitude/Demeanor/Rapport: Gracious Affect (typically observed): Accepting Orientation: : Oriented to Self, Oriented to Place, Oriented to  Time, Oriented to Situation Alcohol / Substance Use: Not Applicable Psych Involvement: No (comment)  Admission diagnosis:  Heel ulcer, right, with necrosis of muscle ([place]) [L97.413] Patient Active Problem List   Diagnosis Date Noted  . Heel ulcer, right, with necrosis of muscle ([place])   . Chronic diastolic heart failure ([place]) 09/12/2013  . Rheumatoid arthritis ([place]) 09/12/2013  . IDDM (insulin dependent diabetes mellitus) 09/09/2013  . Cellulitis of right lower extremity 09/08/2013   PCP:  Ward Givens L. (Inactive) Pharmacy:   [facility] [place], [place] [address] Phone: [phone] Fax: [place], [place] - [address] [place] [address] Phone: [phone] Fax: [phone]     Social Determinants of Health (SDOH) Interventions    Readmission Risk Interventions Readmission Risk Prevention Plan 08/17/2019  Post Dischage Appt Complete  Medication Screening Complete  Transportation Screening Complete  Some recent data might be hidden

## 2019-08-17 NOTE — Progress Notes (Signed)
PT Cancellation Note  Patient Details Name: NEOSHA SWITALSKI MRN: [id number] DOB: [birth year]   Cancelled Treatment:    Reason Eval/Treat Not Completed: Fatigue/lethargy limiting ability to participate. Upon PT arrival, pt reports she just returned to chair from mobilizing to bathroom and is now too fatigued to participate in therapy session. PT will continue to follow and treat as time/schedule allow.   Rolm Baptise, PT, DPT   Acute Rehabilitation Department Pager #: [phone]   Gaetana Michaelis 08/17/2019, 3:31 PM

## 2019-08-17 NOTE — Progress Notes (Signed)
OT Cancellation Note  Patient Details Name: Kaitlin Hudson MRN: [id number] DOB: August 15, 1955   Cancelled Treatment:    Reason Eval/Treat Not Completed: (P) Other (comment)(low blood sugar. Nsg asking to hold till later. )  Pierre Dellarocco,HILLARY 08/17/2019, 4:38 PM  Luisa Dago, OT/L   Acute OT Clinical Specialist Acute Rehabilitation Services Pager [phone] Office [phone]

## 2019-08-17 NOTE — Progress Notes (Signed)
Peripherally Inserted Central Catheter Placement  The IV Nurse has discussed with the patient and/or persons authorized to consent for the patient, the purpose of this procedure and the potential benefits and risks involved with this procedure.  The benefits include less needle sticks, lab draws from the catheter, and the patient may be discharged home with the catheter. Risks include, but not limited to, infection, bleeding, blood clot (thrombus formation), and puncture of an artery; nerve damage and irregular heartbeat and possibility to perform a PICC exchange if needed/ordered by physician.  Alternatives to this procedure were also discussed.  Bard Power PICC patient education guide, fact sheet on infection prevention and patient information card has been provided to patient /or left at bedside.    PICC Placement Documentation  PICC Single Lumen 08/17/19 PICC Right Basilic 35 cm 0 cm (Active)  Indication for Insertion or Continuance of Line Home intravenous therapies (PICC only) 08/17/19 1700  Exposed Catheter (cm) 0 cm 08/17/19 1700  Site Assessment Clean;Dry;Intact 08/17/19 1700  Line Status Flushed;Saline locked;Blood return noted 08/17/19 1700  Dressing Type Transparent;Securing device 08/17/19 1700  Dressing Status Clean;Dry;Intact 08/17/19 1700  Dressing Change Due 08/24/19 08/17/19 1700       Franne Grip Renee 08/17/2019, 5:52 PM

## 2019-08-17 NOTE — Care Management (Cosign Needed)
    Durable Medical Equipment  (From admission, onward)         Start     Ordered   08/17/19 1205  For home use only DME 3 n 1  Once     08/17/19 1206   08/17/19 1205  For home use only DME Walker rolling  Once    Question Answer Comment  Walker: With 5 Inch Wheels   Patient needs a walker to treat with the following condition S/P excisional debridement      08/17/19 1206   08/17/19 1203  For home use only DME lightweight manual wheelchair with seat cushion  Once    Comments: Patient suffers from Right achilles excisional debridement and skin grafting which impairs their ability to perform daily activities like ADLs:20651 in the home.  A walking WKH:61548 will not resolve  issue with performing activities of daily living. A wheelchair will allow patient to safely perform daily activities. Patient is not able to propel themselves in the home using a standard weight wheelchair due to weakness:20653. Patient can self propel in the lightweight wheelchair. Length of need 12 months. Accessories: elevating leg rests (ELRs), wheel locks, extensions and anti-tippers.   08/17/19 1203

## 2019-08-18 LAB — GLUCOSE, CAPILLARY
Glucose-Capillary: 249 mg/dL — ABNORMAL HIGH (ref 70–99)
Glucose-Capillary: 184 mg/dL — ABNORMAL HIGH (ref 70–99)
Glucose-Capillary: 50 mg/dL — ABNORMAL LOW (ref 70–99)
Glucose-Capillary: 53 mg/dL — ABNORMAL LOW (ref 70–99)
Glucose-Capillary: 54 mg/dL — ABNORMAL LOW (ref 70–99)
Glucose-Capillary: 57 mg/dL — ABNORMAL LOW (ref 70–99)
Glucose-Capillary: 57 mg/dL — ABNORMAL LOW (ref 70–99)
Glucose-Capillary: 130 mg/dL — ABNORMAL HIGH (ref 70–99)
Glucose-Capillary: 200 mg/dL — ABNORMAL HIGH (ref 70–99)

## 2019-08-18 LAB — PROTIME-INR
INR: 1 (ref 0.8–1.2)
Prothrombin Time: 13 seconds (ref 11.4–15.2)

## 2019-08-18 MED ORDER — CHLORHEXIDINE GLUCONATE CLOTH 2 % EX PADS: 6.0000 | MEDICATED_PAD | Freq: Every day | CUTANEOUS | Status: DC

## 2019-08-18 NOTE — Plan of Care (Signed)
  Problem: Education: Goal: Knowledge of General Education information will improve Description Including pain rating scale, medication(s)/side effects and non-pharmacologic comfort measures Outcome: Progressing   Problem: Clinical Measurements: Goal: Ability to maintain clinical measurements within normal limits will improve Outcome: Progressing   Problem: Activity: Goal: Risk for activity intolerance will decrease Outcome: Progressing   Problem: Elimination: Goal: Will not experience complications related to bowel motility Outcome: Progressing   Problem: Pain Managment: Goal: General experience of comfort will improve Outcome: Progressing   Problem: Safety: Goal: Ability to remain free from injury will improve Outcome: Progressing   

## 2019-08-18 NOTE — Progress Notes (Signed)
Patient reported itching and redness to skin after CHG wipes previously. Allergy added

## 2019-08-18 NOTE — Evaluation (Signed)
Occupational Therapy Evaluation Patient Details Name: Kaitlin Hudson MRN: [id number] DOB: 1955-05-08 Today's Date: 08/18/2019    History of Present Illness Pt is a 64 yo female s/p R achilles excisional debridement, skin grafting, and wound vac placement on 08/15/19. PMH includes DM I with neuropathy, venous insufficiency, pitting edema LEs, CHF, HLD, HTN.   Clinical Impression   Pt admitted with  s/p R achilles excisional debridement. Pt currently with functional limitations due to the deficits listed below (see OT Problem List). Pt required moderate assistance to don PRAFO on LE. Pt and husband educated on positioning as pt's husband has had extensive back sx. Pt completed sit to stand transfers with supervision, ambulation with RW with supervision to min guard and  completed unsupportive standing with dynamic reaching with no LOB. Pt's husband reported they have a ramp to assist with one step entrance at home in this session they can look at installing for entrance of there home.  Pt will benefit from skilled OT to increase their safety and independence with ADL and functional mobility for ADL to facilitate discharge to venue listed below.       Follow Up Recommendations  Home health OT;Supervision/Assistance - 24 hour    Equipment Recommendations  3 in 1 bedside commode    Recommendations for Other Services       Precautions / Restrictions Precautions Precautions: Fall Precaution Comments: wound VAC Restrictions Weight Bearing Restrictions: Yes RLE Weight Bearing: Weight bearing as tolerated Other Position/Activity Restrictions: WBAT in PRAFO during gait      Mobility Bed Mobility                  Transfers Overall transfer level: Needs assistance Equipment used: Rolling walker (2 wheeled) Transfers: Sit to/from Stand Sit to Stand: Supervision         General transfer comment: good technique utilized, steady with transition    Balance Overall balance  assessment: Needs assistance;History of Falls Sitting-balance support: No upper extremity supported;Feet supported Sitting balance-Leahy Scale: Good     Standing balance support: Bilateral upper extremity supported Standing balance-Leahy Scale: Poor Standing balance comment: able to dynamic reach woth no LOB                           ADL either performed or assessed with clinical judgement   ADL Overall ADL's : Needs assistance/impaired Eating/Feeding: Independent;Sitting   Grooming: Wash/dry hands;Wash/dry face;Supervision/safety;Standing   Upper Body Bathing: Set up;Sitting   Lower Body Bathing: Min guard;Cueing for safety;Cueing for sequencing;Sit to/from stand   Upper Body Dressing : Set up;Sitting   Lower Body Dressing: Min guard;Cueing for safety;Cueing for sequencing;Sit to/from stand   Toilet Transfer: Min guard;Comfort height toilet;Grab bars;Cueing for safety;Cueing for sequencing   Toileting- Clothing Manipulation and Hygiene: Supervision/safety;Cueing for safety;Cueing for sequencing;Sit to/from stand   Tub/ Shower Transfer: Walk-in shower;Minimal assistance;Cueing for safety;Cueing for sequencing;Shower seat;Grab bars;Rolling walker   Functional mobility during ADLs: Supervision/safety;Cueing for safety;Cueing for sequencing;Rolling walker       Vision Patient Visual Report: No change from baseline Vision Assessment?: No apparent visual deficits     Perception Perception Perception Tested?: No   Praxis Praxis Praxis tested?: Within functional limits    Pertinent Vitals/Pain Pain Assessment: 0-10 Pain Score: 6  Pain Location: R ankle Pain Descriptors / Indicators: Sore;Aching;Burning Pain Intervention(s): Limited activity within patient's tolerance;Monitored during session;Repositioned     Hand Dominance Right   Extremity/Trunk Assessment Upper Extremity Assessment Upper Extremity  Assessment: Generalized weakness(due to RA)   Lower  Extremity Assessment Lower Extremity Assessment: Defer to PT evaluation RLE Deficits / Details: Tightness in knee extension lacking ~10* extension with empty end feel due to pain; able to perform toe flex/ext RLE: Unable to fully assess due to pain   Cervical / Trunk Assessment Cervical / Trunk Assessment: Normal   Communication Communication Communication: No difficulties   Cognition Arousal/Alertness: Awake/alert Behavior During Therapy: WFL for tasks assessed/performed Overall Cognitive Status: Within Functional Limits for tasks assessed                                 General Comments: Pt agreeable and cooperative through session. Pt able to demo good awareness of lines and fatigue   General Comments       Exercises     Shoulder Instructions      Home Living Family/patient expects to be discharged to:: Private residence Living Arrangements: Spouse/significant other Available Help at Discharge: Family;Available 24 hours/day Type of Home: House Home Access: Stairs to enter [employer] of Steps: 1   Home Layout: One level     Bathroom Shower/Tub: Occupational psychologist: Standard Bathroom Accessibility: Yes How Accessible: Accessible via walker Home Equipment: [place] - 4 wheels;Cane - single point;Transport chair   Additional Comments: per husband can place a ramp over step      Prior Functioning/Environment Level of Independence: Needs assistance  Gait / Transfers Assistance Needed: Pt reports walking with cane PTA, x1 fall over the past year due to R foot dysfunction ADL's / Homemaking Assistance Needed: Pt reports dressing, bathing, and cleaning without assist of husband; states her husband is the cook            OT Problem List: Decreased strength;Decreased activity tolerance;Impaired balance (sitting and/or standing);Decreased knowledge of use of DME or AE;Pain      OT Treatment/Interventions: Self-care/ADL  training;Therapeutic exercise;DME and/or AE instruction;Therapeutic activities;Cognitive remediation/compensation;Balance training;Patient/family education    OT Goals(Current goals can be found in the care plan section) Acute Rehab OT Goals Patient Stated Goal: go home OT Goal Formulation: With patient Time For Goal Achievement: 08/18/19 Potential to Achieve Goals: Good  OT Frequency: Min 2X/week   Barriers to D/C:            Co-evaluation              AM-PAC OT "6 Clicks" Daily Activity     Outcome Measure Help from another person eating meals?: None Help from another person taking care of personal grooming?: None Help from another person toileting, which includes using toliet, bedpan, or urinal?: A Little Help from another person bathing (including washing, rinsing, drying)?: A Little Help from another person to put on and taking off regular upper body clothing?: None Help from another person to put on and taking off regular lower body clothing?: A Little 6 Click Score: 21   End of Session Equipment Utilized During Treatment: Gait belt;Rolling walker  Activity Tolerance: Patient tolerated treatment well Patient left: in chair;with call bell/phone within reach;with family/visitor present  OT Visit Diagnosis: Unsteadiness on feet (R26.81);Repeated falls (R29.6);Other abnormalities of gait and mobility (R26.89);History of falling (Z91.81);Pain Pain - Right/Left: Right Pain - part of body: Leg                Time: 0258-5277 OT Time Calculation (min): 41 min Charges:  OT General Charges $OT Visit: 1 Visit OT  Evaluation $OT Eval Low Complexity: 1 Low OT Treatments $Self Care/Home Management : 23-37 mins  Alphia Moh OTR/L  Acute Rehab Services  [phone] office number [phone] pager number   Alphia Moh 08/18/2019, 2:25 PM

## 2019-08-18 NOTE — Progress Notes (Signed)
Physical Therapy Treatment Patient Details Name: Kaitlin Hudson MRN: [id number] DOB: 04-29-1955 Today's Date: 08/18/2019    History of Present Illness Pt is a 64 yo female s/p R achilles excisional debridement, skin grafting, and wound vac placement on 08/15/19. PMH includes DM I with neuropathy, venous insufficiency, pitting edema LEs, CHF, HLD, HTN.    PT Comments    Pt making slow, steady progress overall with mobility. She remains limited secondary to pain and fatigue. Per pt, plan to potentially d/c home on Monday (6/8). Will need stair training (one step) prior to d/c. Pt would continue to benefit from skilled physical therapy services at this time while admitted and after d/c to address the below listed limitations in order to improve overall safety and independence with functional mobility.    Follow Up Recommendations  Home health PT;Supervision/Assistance - 24 hour     Equipment Recommendations  Rolling walker with 5" wheels;3in1 (PT)    Recommendations for Other Services       Precautions / Restrictions Precautions Precautions: Fall Precaution Comments: wound VAC Restrictions Weight Bearing Restrictions: Yes RLE Weight Bearing: Weight bearing as tolerated Other Position/Activity Restrictions: WBAT in PRAFO during gait    Mobility  Bed Mobility               General bed mobility comments: up in chair upon PT arrival, requests stay in chair upon PT exit.  Transfers Overall transfer level: Needs assistance Equipment used: Rolling walker (2 wheeled) Transfers: Sit to/from Stand Sit to Stand: Supervision         General transfer comment: good technique utilized, steady with transition  Ambulation/Gait Ambulation/Gait assistance: Min guard Gait Distance (Feet): 45 Feet Assistive device: Rolling walker (2 wheeled) Gait Pattern/deviations: Step-to pattern;Decreased stride length;Decreased step length - right;Decreased weight shift to right;Antalgic;Trunk  flexed Gait velocity: decreased   General Gait Details: pt with slow, steady and guarded gait with short step length bilaterally; occasional cueing to maintain proximity to RW and for sequencing   Stairs             Wheelchair Mobility    Modified Rankin (Stroke Patients Only)       Balance Overall balance assessment: Needs assistance;History of Falls Sitting-balance support: No upper extremity supported;Feet supported Sitting balance-Leahy Scale: Good     Standing balance support: Bilateral upper extremity supported Standing balance-Leahy Scale: Poor                              Cognition Arousal/Alertness: Awake/alert Behavior During Therapy: WFL for tasks assessed/performed Overall Cognitive Status: Within Functional Limits for tasks assessed                                        Exercises General Exercises - Lower Extremity Long Arc Quad: AROM;Strengthening;Right;10 reps;Seated Hip Flexion/Marching: Seated;AROM;Strengthening;Both;15 reps    General Comments        Pertinent Vitals/Pain Pain Assessment: Faces Faces Pain Scale: Hurts little more Pain Location: R ankle Pain Descriptors / Indicators: Sore;Aching;Burning Pain Intervention(s): Monitored during session;Repositioned    Home Living                      Prior Function            PT Goals (current goals can now be found in the care plan section) Acute Rehab  PT Goals PT Goal Formulation: With patient Time For Goal Achievement: 08/30/19 Potential to Achieve Goals: Good Progress towards PT goals: Progressing toward goals    Frequency    Min 3X/week      PT Plan Current plan remains appropriate    Co-evaluation              AM-PAC PT "6 Clicks" Mobility   Outcome Measure  Help needed turning from your back to your side while in a flat bed without using bedrails?: A Little Help needed moving from lying on your back to sitting on the  side of a flat bed without using bedrails?: A Little Help needed moving to and from a bed to a chair (including a wheelchair)?: None Help needed standing up from a chair using your arms (e.g., wheelchair or bedside chair)?: None Help needed to walk in hospital room?: None Help needed climbing 3-5 steps with a railing? : A Lot 6 Click Score: 20    End of Session Equipment Utilized During Treatment: Gait belt Activity Tolerance: Patient tolerated treatment well Patient left: in chair;with call bell/phone within reach;with family/visitor present Nurse Communication: Mobility status PT Visit Diagnosis: Other abnormalities of gait and mobility (R26.89);Muscle weakness (generalized) (M62.81);Difficulty in walking, not elsewhere classified (R26.2)     Time: 1000-1017 PT Time Calculation (min) (ACUTE ONLY): 17 min  Charges:  $Gait Training: 8-22 mins                     Arletta Bale, DPT  Acute Rehabilitation Services Pager [phone] Office [phone]     Alessandra Bevels Watt Geiler 08/18/2019, 10:37 AM

## 2019-08-18 NOTE — Progress Notes (Signed)
Inpatient Diabetes Program Recommendations  AACE/ADA: New Consensus Statement on Inpatient Glycemic Control (2015)  Target Ranges:  Prepandial:   less than 140 mg/dL      Peak postprandial:   less than 180 mg/dL (1-2 hours)      Critically ill patients:  140 - 180 mg/dL   Lab Results  Component Value Date   GLUCAP 249 (H) 08/18/2019   HGBA1C 7.2 (H) 08/15/2019    Review of Glycemic Control Results for Kaitlin Hudson, Kaitlin Hudson (MRN [id number]) as of 08/18/2019 08:25  Ref. Range 08/17/2019 16:32 08/17/2019 21:56 08/17/2019 22:42 08/17/2019 23:17 08/18/2019 07:49  Glucose-Capillary Latest Ref Range: 70 - 99 mg/dL 271 (H) 57 (L) 57 (L) 84 249 (H)    Inpatient Diabetes Program Recommendations:   Spoke with patient via phone (DM coordinator on call) and reviewed CBGs and meal coverage she administered. CBG 57 post meal correction. Discussed with patient to reevaluate amount of carbohydrates she eats and cover carefully. Patient states "I'm always up and down".   Thank you, Billy Fischer. Kaitlin Anker, RN, MSN, CDE  Diabetes Coordinator Inpatient Glycemic Control Team Team Pager [phone] (8am-5pm) 08/18/2019 8:28 AM

## 2019-08-18 NOTE — Progress Notes (Signed)
Hypoglycemic Event  CBG @1650 =50  CBG @1658 =57  CBG @1721 =53  CBG @1723 =57   Treatment: Dinner Tray  Symptoms: none  Follow-up CBG: Time:1832 CBG Result:130  Possible Reasons for Event:  Insulin Pump  Diyari Cherne RN

## 2019-08-19 LAB — GLUCOSE, CAPILLARY
Glucose-Capillary: 228 mg/dL — ABNORMAL HIGH (ref 70–99)
Glucose-Capillary: 276 mg/dL — ABNORMAL HIGH (ref 70–99)
Glucose-Capillary: 180 mg/dL — ABNORMAL HIGH (ref 70–99)
Glucose-Capillary: 172 mg/dL — ABNORMAL HIGH (ref 70–99)
Glucose-Capillary: 128 mg/dL — ABNORMAL HIGH (ref 70–99)

## 2019-08-19 LAB — PROTIME-INR
INR: 1 (ref 0.8–1.2)
Prothrombin Time: 12.5 seconds (ref 11.4–15.2)

## 2019-08-19 NOTE — Progress Notes (Signed)
     Subjective: 4 Days Post-Op Procedure(s) (LRB): RIGHT ACHILLES DEBRIDEMENT, POSSIBLE SKIN GRAFT (Right) Awake, alert and oriented x 4. MS is normal. Antbiotics, VAC right posterior distal leg.  Patient reports pain as mild.    Objective:   VITALS:  Temp:  [97.5 F (36.4 C)-98.3 F (36.8 C)] 97.5 F (36.4 C) (06/06 0747) Pulse Rate:  [84-101] 84 (06/06 0747) Resp:  [14-18] 18 (06/06 0747) BP: (112-131)/(40-56) 118/56 (06/06 0747) SpO2:  [98 %-100 %] 99 % (06/06 0747)  ABD soft Intact pulses distally Dorsiflexion/Plantar flexion intact Incision: dressing C/D/I and moderate drainage No cellulitis present Compartment soft VAC to negative pressure minimal drainage.  LABS No results for input(s): HGB, WBC, PLT in the last 72 hours. No results for input(s): NA, K, CL, CO2, BUN, CREATININE, GLUCOSE in the last 72 hours. Recent Labs    08/18/19 0411 08/19/19 0355  INR 1.0 1.0     Assessment/Plan: 4 Days Post-Op Procedure(s) (LRB): RIGHT ACHILLES DEBRIDEMENT, POSSIBLE SKIN GRAFT (Right)  Advance diet Up with therapy Continue ABX therapy due to Post-op infection Discharge to SNF  Vira Browns 08/19/2019, 10:28 AM

## 2019-08-19 NOTE — Plan of Care (Signed)

## 2019-08-20 LAB — BASIC METABOLIC PANEL
Anion gap: 7 (ref 5–15)
BUN: 38 mg/dL — ABNORMAL HIGH (ref 8–23)
CO2: 20 mmol/L — ABNORMAL LOW (ref 22–32)
Calcium: 8.5 mg/dL — ABNORMAL LOW (ref 8.9–10.3)
Chloride: 109 mmol/L (ref 98–111)
Creatinine, Ser: 1.3 mg/dL — ABNORMAL HIGH (ref 0.44–1.00)
GFR calc Af Amer: 50 mL/min — ABNORMAL LOW (ref >60)
GFR calc non Af Amer: 43 mL/min — ABNORMAL LOW (ref >60)
Glucose, Bld: 118 mg/dL — ABNORMAL HIGH (ref 70–99)
Potassium: 4.7 mmol/L (ref 3.5–5.1)
Sodium: 136 mmol/L (ref 135–145)

## 2019-08-20 LAB — AEROBIC/ANAEROBIC CULTURE W GRAM STAIN (SURGICAL/DEEP WOUND)

## 2019-08-20 LAB — VANCOMYCIN, TROUGH: Vancomycin Tr: 24 ug/mL (ref 15–20)

## 2019-08-20 LAB — GLUCOSE, CAPILLARY
Glucose-Capillary: 122 mg/dL — ABNORMAL HIGH (ref 70–99)
Glucose-Capillary: 95 mg/dL (ref 70–99)
Glucose-Capillary: 207 mg/dL — ABNORMAL HIGH (ref 70–99)
Glucose-Capillary: 200 mg/dL — ABNORMAL HIGH (ref 70–99)

## 2019-08-20 LAB — PROTIME-INR
INR: 1 (ref 0.8–1.2)
Prothrombin Time: 12.6 seconds (ref 11.4–15.2)

## 2019-08-20 MED ORDER — SODIUM CHLORIDE 0.9 % IV SOLN: 2.0000 g | INTRAVENOUS | 0 refills | Status: DC

## 2019-08-20 MED ORDER — VANCOMYCIN IV (FOR PTA / DISCHARGE USE ONLY): 750.0000 mg | INTRAVENOUS | 0 refills | Status: AC

## 2019-08-20 MED ORDER — BISACODYL 10 MG RE SUPP: 10.0000 mg | Freq: Every day | RECTAL | 0 refills | Status: DC | PRN

## 2019-08-20 MED ORDER — SODIUM CHLORIDE 0.9 % IV SOLN
2.0000 g | INTRAVENOUS | Status: DC
  Administered 2019-08-20: 2 g via INTRAVENOUS
  Filled 2019-08-20 (×2): qty 20

## 2019-08-20 MED ORDER — OXYCODONE HCL 5 MG PO TABS: 5.0000 mg | ORAL_TABLET | ORAL | 0 refills | Status: DC | PRN

## 2019-08-20 MED ORDER — HEPARIN SOD (PORK) LOCK FLUSH 100 UNIT/ML IV SOLN
250.0000 [IU] | INTRAVENOUS | Status: AC | PRN
  Administered 2019-08-20: 250 [IU]
  Filled 2019-08-20: qty 2.5

## 2019-08-20 MED ORDER — VANCOMYCIN HCL 750 MG/150ML IV SOLN: 750.0000 mg | INTRAVENOUS | 0 refills | Status: DC

## 2019-08-20 MED ORDER — VANCOMYCIN HCL 750 MG/150ML IV SOLN
750.0000 mg | INTRAVENOUS | Status: DC
  Administered 2019-08-20: 750 mg via INTRAVENOUS
  Filled 2019-08-20: qty 150

## 2019-08-20 MED ORDER — CEFTRIAXONE IV (FOR PTA / DISCHARGE USE ONLY): 2.0000 g | INTRAVENOUS | 0 refills | Status: AC

## 2019-08-20 MED ORDER — DOCUSATE SODIUM 100 MG PO CAPS: 100.0000 mg | ORAL_CAPSULE | Freq: Two times a day (BID) | ORAL | 0 refills | Status: AC

## 2019-08-20 NOTE — Discharge Summary (Deleted)
Discharge Diagnoses:  Active Problems:   Heel ulcer, right, with necrosis of muscle ([place])   Surgeries: Procedure(s): RIGHT ACHILLES DEBRIDEMENT, POSSIBLE SKIN GRAFT on 08/15/2019    Consultants:   Discharged Condition: Improved  Hospital Course: Kaitlin Hudson is an 64 y.o. female who was admitted 08/15/2019 with a chief complaint of right heel ulcer, with a final diagnosis of Necrotic Right Achilles Ulcer.  Patient was brought to the operating room on 08/15/2019 and underwent Procedure(s): RIGHT ACHILLES DEBRIDEMENT, POSSIBLE SKIN GRAFT.    Patient was given perioperative antibiotics:  Anti-infectives (From admission, onward)   Start     Dose/Rate Route Frequency Ordered Stop   08/20/19 2030  vancomycin (VANCOREADY) IVPB 750 mg/150 mL     750 mg 150 mL/hr over 60 Minutes Intravenous Every 24 hours 08/20/19 0048     08/20/19 0000  vancomycin (VANCOREADY) 750 MG/150ML SOLN     750 mg Intravenous Every 24 hours 08/20/19 0811 09/10/19 2359   08/16/19 2030  vancomycin (VANCOREADY) IVPB 1250 mg/250 mL  Status:  Discontinued     1,250 mg 166.7 mL/hr over 90 Minutes Intravenous Every 24 hours 08/15/19 1952 08/20/19 0048   08/15/19 2000  vancomycin (VANCOREADY) IVPB 1750 mg/350 mL     1,750 mg 175 mL/hr over 120 Minutes Intravenous  Once 08/15/19 1952 08/15/19 2245    .  Patient was given sequential compression devices, early ambulation, and aspirin for DVT prophylaxis.  Recent vital signs:  Patient Vitals for the past 24 hrs:  BP Temp Temp src Pulse Resp SpO2  08/20/19 0750 (!) 144/54 97.9 F (36.6 C) Oral 89 18 100 %  08/20/19 0404 (!) 115/56 98.1 F (36.7 C) Oral 90 14 98 %  08/19/19 1951 (!) 134/57 98.4 F (36.9 C) Oral 97 14 96 %  08/19/19 1212 (!) 129/55 98.4 F (36.9 C) Oral 93 18 100 %  .  Recent laboratory studies: No results found.  Discharge Medications:   Allergies as of 08/20/2019      Reactions   Aspirin Other (See Comments)   Nose bleed   Latex Rash   Other  Rash   VINYL   Chlorhexidine    CHG wipes   Cimzia [certolizumab Pegol] Other (See Comments)   bleeding   Clindamycin/lincomycin Diarrhea   Diclofenac Nausea Only   Enbrel [etanercept] Hives   Erythromycin Other (See Comments)   Severe stomach cramping   Leflunomide Other (See Comments)   Fatigue/anxiety   Rynatan [chlorpheniramine-phenyleph Er] Other (See Comments)   Stomach ache    Statins Other (See Comments)   Myalgias.   Tramadol Nausea And Vomiting   Keflet [cephalexin] Rash   Severe stomach cramping   Penicillins Rash   Pneumococcal Polysaccharide Vaccine Rash   All over body   Sulfa Antibiotics Rash      Medication List    TAKE these medications   acetaminophen 500 MG tablet Commonly known as: TYLENOL Take 2 tablets (1,000 mg total) by mouth every 8 (eight) hours as needed for moderate pain.   bisacodyl 10 MG suppository Commonly known as: DULCOLAX Place 1 suppository (10 mg total) rectally daily as needed for moderate constipation.   bumetanide 0.5 MG tablet Commonly known as: BUMEX Take 0.5 mg by mouth every other day as needed (fluid retention.).   cholecalciferol 25 MCG (1000 UNIT) tablet Commonly known as: VITAMIN D Take 1,000 Units by mouth daily.   docusate sodium 100 MG capsule Commonly known as: COLACE Take 1 capsule (100 mg  total) by mouth 2 (two) times daily.   HumaLOG 100 UNIT/ML injection Generic drug: insulin lispro as directed. Via Insulin Pump   insulin pump Soln Inject into the skin as directed. With humalog insulin   INSULIN SYRINGE .3CC/31GX5/16" 31G X 5/16" 0.3 ML Misc Use 1 time daily   lisinopril 40 MG tablet Commonly known as: ZESTRIL Take 60 mg by mouth daily.   NovoLIN N 100 UNIT/ML injection Generic drug: insulin NPH Human Inject 8-10 Units into the skin daily. While on higher dose of prednisone.   OneTouch Ultra test strip Generic drug: glucose blood Use 6 times daily. E11.65 on insulin pump   oxyCODONE 5 MG  immediate release tablet Commonly known as: Oxy IR/ROXICODONE Take 1-2 tablets (5-10 mg total) by mouth every 4 (four) hours as needed for moderate pain (pain score 4-6).   predniSONE 5 MG tablet Commonly known as: DELTASONE Take 5-20 mg by mouth See admin instructions. Take 4 tablets (20 mg) by mouth in the morning & take 1 tablet (5 mg) by mouth at night.   vancomycin 750 MG/150ML Soln Commonly known as: VANCOREADY Inject 150 mLs (750 mg total) into the vein daily for 21 days.            Durable Medical Equipment  (From admission, onward)         Start     Ordered   08/17/19 1205  For home use only DME 3 n 1  Once     08/17/19 1206   08/17/19 1205  For home use only DME Walker rolling  Once    Question Answer Comment  Walker: With [place] Wheels   Patient needs a walker to treat with the following condition S/P excisional debridement      08/17/19 1206   08/17/19 1203  For home use only DME lightweight manual wheelchair with seat cushion  Once    Comments: Patient suffers from Right achilles excisional debridement and skin grafting which impairs their ability to perform daily activities like [id number] in the home.  A walking [id number] will not resolve  issue with performing activities of daily living. A wheelchair will allow patient to safely perform daily activities. Patient is not able to propel themselves in the home using a standard weight wheelchair due to [id number]. Patient can self propel in the lightweight wheelchair. Length of need 12 months. Accessories: elevating leg rests (ELRs), wheel locks, extensions and anti-tippers.   08/17/19 1203           Discharge Care Instructions  (From admission, onward)         Start     Ordered   08/20/19 0000  Change dressing on IV access line weekly and PRN  (Home infusion instructions - [facility] )     08/20/19 0811          Diagnostic Studies: XR Ankle Complete Right  Result Date:  07/23/2019 Right ankle 3 views: No acute fractures.  No acute findings.  Mild arthritic changes.  Arthrosclerosis changes noted.  Talus well located within the ankle mortise without diastases.  [place] EKG SITE RITE  Result Date: 08/17/2019 If Site Rite image not attached, placement could not be confirmed due to current cardiac rhythm.   Patient benefited maximally from their hospital stay and there were no complications.     Disposition: Discharge disposition: 01-Home or Self Care      Discharge Instructions    [facility] pharmacist to adjust dose for Vancomycin, Aminoglycosides  and other anti-infective therapies as requested by physician.   Complete by: As directed    [facility] to provide Cath Flo 54m   Complete by: As directed    Administer for PICC line occlusion and as ordered by physician for other access device issues.   Anaphylaxis Kit: Provided to treat any anaphylactic reaction to the medication being provided to the patient if First Dose or when requested by physician   Complete by: As directed    Epinephrine 147mml vial / amp: Administer 0.64m26m0.64ml264mubcutaneously once for moderate to severe anaphylaxis, nurse to call physician and pharmacy when reaction occurs and call 911 if needed for immediate care   Diphenhydramine 50mg78mIV vial: Administer 25-50mg 29mM PRN for first dose reaction, rash, itching, mild reaction, nurse to call physician and pharmacy when reaction occurs   Sodium Chloride 0.9% NS 500ml I15mdminister if needed for hypovolemic blood pressure drop or as ordered by physician after call to physician with anaphylactic reaction   Call MD / Call 911   Complete by: As directed    If you experience chest pain or shortness of breath, CALL 911 and be transported to the hospital emergency room.  If you develope a fever above 101 F, pus (white drainage) or increased drainage or redness at the wound, or calf pain, call your surgeon's office.    Call MD / Call 911   Complete by: As directed    If you experience chest pain or shortness of breath, CALL 911 and be transported to the hospital emergency room.  If you develope a fever above 101 F, pus (white drainage) or increased drainage or redness at the wound, or calf pain, call your surgeon's office.   Change dressing on IV access line weekly and PRN   Complete by: As directed    Constipation Prevention   Complete by: As directed    Drink plenty of fluids.  Prune juice may be helpful.  You may use a stool softener, such as Colace (over the counter) 100 mg twice a day.  Use MiraLax (over the counter) for constipation as needed.   Constipation Prevention   Complete by: As directed    Drink plenty of fluids.  Prune juice may be helpful.  You may use a stool softener, such as Colace (over the counter) 100 mg twice a day.  Use MiraLax (over the counter) for constipation as needed.   Diet - low sodium heart healthy   Complete by: As directed    Diet - low sodium heart healthy   Complete by: As directed    Flush IV access with Sodium Chloride 0.9% and Heparin 10 units/ml or 100 units/ml   Complete by: As directed    Home infusion instructions - [facility]   Complete by: As directed    Instructions: Flush IV access with Sodium Chloride 0.9% and Heparin 10units/ml or 100units/ml   Change dressing on IV access line: Weekly and PRN   Instructions Cath Flo 2mg: Ad30mister for PICC Line occlusion and as ordered by physician for other access device   [facility] pharmacist to adjust dose for: Vancomycin, Aminoglycosides and other anti-infective therapies as requested by physician   Increase activity slowly as tolerated   Complete by: As directed    Increase activity slowly as tolerated   Complete by: As directed    Method of administration may be changed at the discretion of home infusion pharmacist based upon assessment of the patient  and/or caregiver's ability to  self-administer the medication ordered   Complete by: As directed    Negative Pressure Wound Therapy - Incisional   Complete by: As directed    Show patient how to attach [place]    Newt Minion, MD In 1 week.   Specialty: Orthopedic Surgery Contact information: [place] [address] [phone]        [facility] Follow up.   Why: IV antibiotics will be coordinated through [facility].  Please call [phone] with any questions about your IV antibiotic regimen.       [facility], [facility] Follow up.   Specialty: Home Health Services Why: Alvis Lemmings will be providing services for RN/ PT/OT services.  You should receive a call with in 24-48 hours of your discharge home. Contact information: [address] [place] [address] [phone]        [facility], [facility] Follow up.   Why: [facility] will be supplying you with a wheelchair, rolling walker, and 3:1 for home. Contact information: [address] [place] [address] [phone]            Signed: Bevely Palmer Elizebeth Kluesner 08/20/2019, 8:13 AM

## 2019-08-20 NOTE — Progress Notes (Signed)
PHARMACY CONSULT NOTE FOR:  OUTPATIENT  PARENTERAL ANTIBIOTIC THERAPY (OPAT)  Indication: wound infection Regimen:  Vancomycin 750 mg IV q24hrs - due at 8pm (decreased from 1250 mg IV q24hrs on 6/7 am after trough level 24 mcg/ml on 6/6 pm) End date:  09/10/19  IV antibiotic discharge orders already entered by PA.  Consider adding Ceftriaxone 2 gm IV q24hrs or oral quinolone to cover Haemophilus parainfluenzae in 6/2 wound culture; beta lactamase negative.      Thank you for allowing pharmacy to be a part of this patient's care.  Dennie Fetters, [place] Phone: [phone] 08/20/2019, 10:07 AM

## 2019-08-20 NOTE — Progress Notes (Signed)
Pharmacy Antibiotic Note  Kaitlin Hudson is a 64 y.o. female with diabetic neuropathy and venous insufficiency (massive pitting edema of both lower extremities) admitted on 08/15/2019 with painful R Achilles ulcer/cellulitis. Pt is S/P excisional debridement of skin/soft tissue/muscle/tendon of R Achilles ulcer, with split thickness skin graft this afternoon. Pharmacy has been consulted for vancomycin dosing.  WBC 10.2, afebrile, Scr 1.30, CrCl 45.4 ml/min (renal function ~stable, per available Scr values in Epic)  6/7 AM update:  Vancomycin trough is elevated at 24, drawn correctly No Scr for the last few days>>will order BMET with AM labs  Plan: Dec vancomycin to 750 mg IV q24h Re-check trough as needed  F/U AM Scr  Height: 5\' 4"  (162.6 cm) Weight: 84.8 kg (187 lb) IBW/kg (Calculated) : 54.7  Temp (24hrs), Avg:98.2 F (36.8 C), Min:97.5 F (36.4 C), Max:98.4 F (36.9 C)  Recent Labs  Lab 08/15/19 1237 08/19/19 1951  WBC 10.2  --   CREATININE 1.30*  --   VANCOTROUGH  --  24*    Estimated Creatinine Clearance: 45.4 mL/min (A) (by C-G formula based on SCr of 1.3 mg/dL (H)).    Allergies  Allergen Reactions  . Aspirin Other (See Comments)    Nose bleed  . Latex Rash  . Other Rash    VINYL   . Chlorhexidine     CHG wipes  . Cimzia [Certolizumab Pegol] Other (See Comments)    bleeding  . Clindamycin/Lincomycin Diarrhea  . Diclofenac Nausea Only  . Enbrel [Etanercept] Hives  . Erythromycin Other (See Comments)    Severe stomach cramping  . Leflunomide Other (See Comments)    Fatigue/anxiety  . Rynatan [Chlorpheniramine-Phenyleph Er] Other (See Comments)    Stomach ache   . Statins Other (See Comments)    Myalgias.  . Tramadol Nausea And Vomiting  . Keflet [Cephalexin] Rash    Severe stomach cramping  . Penicillins Rash  . Pneumococcal Polysaccharide Vaccine Rash    All over body  . Sulfa Antibiotics Rash   10/19/19, PharmD, BCPS Clinical  Pharmacist Phone: [phone]

## 2019-08-20 NOTE — Progress Notes (Signed)
Orthopedic Tech Progress Note Patient Details:  Kaitlin Hudson 25-Aug-1955 [id number] Called to speak with RN about patient PRAFO BOOT. Orders says patient is to wear boot 24 hours per day PER MD request. Spoke with someone this morning stating patient PRAFO BOOT was not stable to walk on. I told that person that [facility] BOOT was not meant to be walked on but I could order her a WALKING PRAFO BOOT. Which I called [facility] for that boot. Patient ID: [name], female   DOB: 04/04/1955, 64 y.o.   MRN: [id number]   Donald Pore 08/20/2019, 11:06 AM

## 2019-08-20 NOTE — Progress Notes (Signed)
Patient is 5 days status post irrigation and debridement of right Achilles tendon ulcer and wound.  Patient has multiple allergies and is currently on IV vancomycin.  We will need to be discharged with PICC line.  Patient is adamant that she wants to go home and wants to go home today.  She is refusing any kind of skilled nursing.  Vital signs stable afebrile has had 150 cc of pink serous fluid in the last 2 days in the back.  Alert laying in bed with her husband.  Explained to the patient that our preference would be skilled nursing however she is refusing this.  She also wants to be discharged home today with IV antibiotics can be arranged as well as home PT OT.  Will reconsult transition of care hopefully this can be accomplished today

## 2019-08-20 NOTE — Progress Notes (Signed)
Physical Therapy Treatment Patient Details Name: DEARDRA HINKLEY MRN: [id number] DOB: 1956/02/13 Today's Date: 08/20/2019    History of Present Illness Pt is a 64 yo female s/p R achilles excisional debridement, skin grafting, and wound vac placement on 08/15/19. PMH includes DM I with neuropathy, venous insufficiency, pitting edema LEs, CHF, HLD, HTN.    PT Comments    Pt progressing towards physical therapy goals. Noted PRAFO was ill-fitting for ambulation and forefoot not supported even with toe extender placed. Called Ortho Tech to request someone come assess fit, and instead they ordered pt a walking PRAFO. Deferred stair training as pt's husband reports he has installed a ramp for pt. Will continue to follow and progress as able per POC.     Follow Up Recommendations  Home health PT;Supervision/Assistance - 24 hour     Equipment Recommendations  Rolling walker with 5" wheels;3in1 (PT)    Recommendations for Other Services       Precautions / Restrictions Precautions Precautions: Fall Precaution Comments: wound VAC Restrictions Weight Bearing Restrictions: Yes RLE Weight Bearing: Weight bearing as tolerated Other Position/Activity Restrictions: WBAT in PRAFO during gait    Mobility  Bed Mobility Overal bed mobility: Needs Assistance             General bed mobility comments: up in chair upon PT arrival, requests stay in chair upon PT exit.  Transfers Overall transfer level: Needs assistance Equipment used: Rolling walker (2 wheeled) Transfers: Sit to/from Stand Sit to Stand: Supervision         General transfer comment: Supervision for safety and cues for hand placement on seated surface for safety. No assist required.   Ambulation/Gait Ambulation/Gait assistance: Supervision Gait Distance (Feet): 75 Feet Assistive device: Rolling walker (2 wheeled) Gait Pattern/deviations: Step-to pattern;Decreased stride length;Decreased step length - right;Decreased weight  shift to right;Antalgic;Trunk flexed Gait velocity: decreased Gait velocity interpretation: <1.8 ft/sec, indicate of risk for recurrent falls General Gait Details: Slow and guarded. No overt LOB noted however close supervision provided for safety. Husband present and provided chair follow.    Stairs             Wheelchair Mobility    Modified Rankin (Stroke Patients Only)       Balance Overall balance assessment: Needs assistance;History of Falls Sitting-balance support: No upper extremity supported;Feet supported Sitting balance-Leahy Scale: Good     Standing balance support: Bilateral upper extremity supported Standing balance-Leahy Scale: Fair Standing balance comment: able to dynamic reach woth no LOB                            Cognition Arousal/Alertness: Awake/alert Behavior During Therapy: WFL for tasks assessed/performed Overall Cognitive Status: Within Functional Limits for tasks assessed                                        Exercises      General Comments        Pertinent Vitals/Pain Pain Assessment: Faces Faces Pain Scale: Hurts a little bit Pain Location: R ankle Pain Descriptors / Indicators: Sore;Aching;Burning Pain Intervention(s): Limited activity within patient's tolerance;Monitored during session;Repositioned    Home Living                      Prior Function            PT  Goals (current goals can now be found in the care plan section) Acute Rehab PT Goals Patient Stated Goal: go home PT Goal Formulation: With patient/family Time For Goal Achievement: 08/30/19 Potential to Achieve Goals: Good Progress towards PT goals: Progressing toward goals    Frequency    Min 3X/week      PT Plan Current plan remains appropriate    Co-evaluation              AM-PAC PT "6 Clicks" Mobility   Outcome Measure  Help needed turning from your back to your side while in a flat bed without using  bedrails?: A Little Help needed moving from lying on your back to sitting on the side of a flat bed without using bedrails?: A Little Help needed moving to and from a bed to a chair (including a wheelchair)?: None Help needed standing up from a chair using your arms (e.g., wheelchair or bedside chair)?: None Help needed to walk in hospital room?: None Help needed climbing 3-5 steps with a railing? : A Lot 6 Click Score: 20    End of Session Equipment Utilized During Treatment: Gait belt Activity Tolerance: Patient tolerated treatment well Patient left: in chair;with call bell/phone within reach;with family/visitor present Nurse Communication: Mobility status PT Visit Diagnosis: Other abnormalities of gait and mobility (R26.89);Muscle weakness (generalized) (M62.81);Difficulty in walking, not elsewhere classified (R26.2)     Time: 1224-8250 PT Time Calculation (min) (ACUTE ONLY): 43 min  Charges:  $Gait Training: 38-52 mins                     Conni Slipper, PT, DPT Acute Rehabilitation Services Pager: [phone] Office: [phone]    Marylynn Pearson 08/20/2019, 12:42 PM

## 2019-08-20 NOTE — Plan of Care (Signed)

## 2019-08-20 NOTE — Progress Notes (Signed)
PREVENNA wound Vac connected and patient was given instructions on care. Small amount of drainage noted in previous wound vac. Discharge instruction package printed and provided to the patient. She verbalize understanding. The patient has follow up appt with Dr. Lajoyce Corners.

## 2019-08-20 NOTE — Plan of Care (Signed)
Patient dichaarge

## 2019-08-20 NOTE — Discharge Summary (Signed)
Discharge Diagnoses:  Active Problems:   Heel ulcer, right, with necrosis of muscle ([facility])   Surgeries: Procedure(s): RIGHT ACHILLES DEBRIDEMENT, POSSIBLE SKIN GRAFT on 08/15/2019    Consultants:   Discharged Condition: Improved  Hospital Course: Kaitlin Hudson is an 64 y.o. female who was admitted 08/15/2019 with a chief complaint of Right Heel ulcer, with a final diagnosis of Necrotic Right Achilles Ulcer.  Patient was brought to the operating room on 08/15/2019 and underwent Procedure(s): RIGHT ACHILLES DEBRIDEMENT, POSSIBLE SKIN GRAFT.    Patient was given perioperative antibiotics:  Anti-infectives (From admission, onward)   Start     Dose/Rate Route Frequency Ordered Stop   08/20/19 2000  vancomycin (VANCOREADY) IVPB 750 mg/150 mL     750 mg 150 mL/hr over 60 Minutes Intravenous Every 24 hours 08/20/19 0048     08/20/19 1200  cefTRIAXone (ROCEPHIN) 2 g in sodium chloride 0.9 % 100 mL IVPB     2 g 200 mL/hr over 30 Minutes Intravenous Every 24 hours 08/20/19 1135 09/10/19 2359   08/20/19 0000  vancomycin (VANCOREADY) 750 MG/150ML SOLN     750 mg Intravenous Every 24 hours 08/20/19 0811 09/10/19 2359   08/20/19 0000  cefTRIAXone 2 g in sodium chloride 0.9 % 100 mL     2 g Intravenous Every 24 hours 08/20/19 1146     08/16/19 2030  vancomycin (VANCOREADY) IVPB 1250 mg/250 mL  Status:  Discontinued     1,250 mg 166.7 mL/hr over 90 Minutes Intravenous Every 24 hours 08/15/19 1952 08/20/19 0048   08/15/19 2000  vancomycin (VANCOREADY) IVPB 1750 mg/350 mL     1,750 mg 175 mL/hr over 120 Minutes Intravenous  Once 08/15/19 1952 08/15/19 2245    .  Patient was given sequential compression devices, early ambulation, and aspirin for DVT prophylaxis.  Recent vital signs:  Patient Vitals for the past 24 hrs:  BP Temp Temp src Pulse Resp SpO2  08/20/19 0750 (!) 144/54 97.9 F (36.6 C) Oral 89 18 100 %  08/20/19 0404 (!) 115/56 98.1 F (36.7 C) Oral 90 14 98 %  08/19/19 1951 (!) 134/57  98.4 F (36.9 C) Oral 97 14 96 %  08/19/19 1212 (!) 129/55 98.4 F (36.9 C) Oral 93 18 100 %  .  Recent laboratory studies: No results found.  Discharge Medications:   Allergies as of 08/20/2019      Reactions   Aspirin Other (See Comments)   Nose bleed   Latex Rash   Other Rash   VINYL   Chlorhexidine    CHG wipes   Cimzia [certolizumab Pegol] Other (See Comments)   bleeding   Clindamycin/lincomycin Diarrhea   Diclofenac Nausea Only   Enbrel [etanercept] Hives   Erythromycin Other (See Comments)   Severe stomach cramping   Leflunomide Other (See Comments)   Fatigue/anxiety   Rynatan [chlorpheniramine-phenyleph Er] Other (See Comments)   Stomach ache    Statins Other (See Comments)   Myalgias.   Tramadol Nausea And Vomiting   Keflet [cephalexin] Rash   Severe stomach cramping   Penicillins Rash   Pneumococcal Polysaccharide Vaccine Rash   All over body   Sulfa Antibiotics Rash      Medication List    TAKE these medications   acetaminophen 500 MG tablet Commonly known as: TYLENOL Take 2 tablets (1,000 mg total) by mouth every 8 (eight) hours as needed for moderate pain.   bisacodyl 10 MG suppository Commonly known as: DULCOLAX Place 1 suppository (10 mg  total) rectally daily as needed for moderate constipation.   bumetanide 0.5 MG tablet Commonly known as: BUMEX Take 0.5 mg by mouth every other day as needed (fluid retention.).   cefTRIAXone 2 g in sodium chloride 0.9 % 100 mL Inject 2 g into the vein daily.   cholecalciferol 25 MCG (1000 UNIT) tablet Commonly known as: VITAMIN D Take 1,000 Units by mouth daily.   docusate sodium 100 MG capsule Commonly known as: COLACE Take 1 capsule (100 mg total) by mouth 2 (two) times daily.   HumaLOG 100 UNIT/ML injection Generic drug: insulin lispro as directed. Via Insulin Pump   insulin pump Soln Inject into the skin as directed. With humalog insulin   INSULIN SYRINGE .3CC/31GX5/16" 31G X 5/16" 0.3 ML  Misc Use 1 time daily   lisinopril 40 MG tablet Commonly known as: ZESTRIL Take 60 mg by mouth daily.   NovoLIN N 100 UNIT/ML injection Generic drug: insulin NPH Human Inject 8-10 Units into the skin daily. While on higher dose of prednisone.   OneTouch Ultra test strip Generic drug: glucose blood Use 6 times daily. E11.65 on insulin pump   oxyCODONE 5 MG immediate release tablet Commonly known as: Oxy IR/ROXICODONE Take 1-2 tablets (5-10 mg total) by mouth every 4 (four) hours as needed for moderate pain (pain score 4-6).   predniSONE 5 MG tablet Commonly known as: DELTASONE Take 5-20 mg by mouth See admin instructions. Take 4 tablets (20 mg) by mouth in the morning & take 1 tablet (5 mg) by mouth at night.   vancomycin 750 MG/150ML Soln Commonly known as: VANCOREADY Inject 150 mLs (750 mg total) into the vein daily for 21 days.            Durable Medical Equipment  (From admission, onward)         Start     Ordered   08/17/19 1205  For home use only DME 3 n 1  Once     08/17/19 1206   08/17/19 1205  For home use only DME Walker rolling  Once    Question Answer Comment  Walker: With Cobalt Wheels   Patient needs a walker to treat with the following condition S/P excisional debridement      08/17/19 1206   08/17/19 1203  For home use only DME lightweight manual wheelchair with seat cushion  Once    Comments: Patient suffers from Right achilles excisional debridement and skin grafting which impairs their ability to perform daily activities like [id number] in the home.  A walking TFT:[id number] will not resolve  issue with performing activities of daily living. A wheelchair will allow patient to safely perform daily activities. Patient is not able to propel themselves in the home using a standard weight wheelchair due to [id number]. Patient can self propel in the lightweight wheelchair. Length of need 12 months. Accessories: elevating leg rests (ELRs), wheel locks,  extensions and anti-tippers.   08/17/19 1203           Discharge Care Instructions  (From admission, onward)         Start     Ordered   08/20/19 0000  Change dressing on IV access line weekly and PRN  (Home infusion instructions - [facility] )     08/20/19 0811          Diagnostic Studies: XR Ankle Complete Right  Result Date: 07/23/2019 Right ankle 3 views: No acute fractures.  No acute findings.  Mild arthritic changes.  Arthrosclerosis changes noted.  Talus well located within the ankle mortise without diastases.  [place] EKG SITE RITE  Result Date: 08/17/2019 If Site Rite image not attached, placement could not be confirmed due to current cardiac rhythm.   Patient benefited maximally from their hospital stay and there were no complications.     Disposition: Discharge disposition: 01-Home or Self Care      Discharge Instructions    [facility] pharmacist to adjust dose for Vancomycin, Aminoglycosides and other anti-infective therapies as requested by physician.   Complete by: As directed    [facility] to provide Cath Flo 69m   Complete by: As directed    Administer for PICC line occlusion and as ordered by physician for other access device issues.   Anaphylaxis Kit: Provided to treat any anaphylactic reaction to the medication being provided to the patient if First Dose or when requested by physician   Complete by: As directed    Epinephrine 177mml vial / amp: Administer 0.35m46m0.35ml47mubcutaneously once for moderate to severe anaphylaxis, nurse to call physician and pharmacy when reaction occurs and call 911 if needed for immediate care   Diphenhydramine 50mg835mIV vial: Administer 25-50mg 735mM PRN for first dose reaction, rash, itching, mild reaction, nurse to call physician and pharmacy when reaction occurs   Sodium Chloride 0.9% NS 500ml I60mdminister if needed for hypovolemic blood pressure drop or as ordered by physician after  call to physician with anaphylactic reaction   Call MD / Call 911   Complete by: As directed    If you experience chest pain or shortness of breath, CALL 911 and be transported to the hospital emergency room.  If you develope a fever above 101 F, pus (white drainage) or increased drainage or redness at the wound, or calf pain, call your surgeon's office.   Call MD / Call 911   Complete by: As directed    If you experience chest pain or shortness of breath, CALL 911 and be transported to the hospital emergency room.  If you develope a fever above 101 F, pus (white drainage) or increased drainage or redness at the wound, or calf pain, call your surgeon's office.   Change dressing on IV access line weekly and PRN   Complete by: As directed    Constipation Prevention   Complete by: As directed    Drink plenty of fluids.  Prune juice may be helpful.  You may use a stool softener, such as Colace (over the counter) 100 mg twice a day.  Use MiraLax (over the counter) for constipation as needed.   Constipation Prevention   Complete by: As directed    Drink plenty of fluids.  Prune juice may be helpful.  You may use a stool softener, such as Colace (over the counter) 100 mg twice a day.  Use MiraLax (over the counter) for constipation as needed.   Diet - low sodium heart healthy   Complete by: As directed    Diet - low sodium heart healthy   Complete by: As directed    Flush IV access with Sodium Chloride 0.9% and Heparin 10 units/ml or 100 units/ml   Complete by: As directed    Home infusion instructions - [facility]   Complete by: As directed    Instructions: Flush IV access with Sodium Chloride 0.9% and Heparin 10units/ml or 100units/ml   Change dressing on IV access line: Weekly and PRN   Instructions Cath Flo 2mg:1m  Administer for PICC Line occlusion and as ordered by physician for other access device   [facility] pharmacist to adjust dose for: Vancomycin, Aminoglycosides  and other anti-infective therapies as requested by physician   Increase activity slowly as tolerated   Complete by: As directed    Increase activity slowly as tolerated   Complete by: As directed    Method of administration may be changed at the discretion of home infusion pharmacist based upon assessment of the patient and/or caregiver's ability to self-administer the medication ordered   Complete by: As directed    Negative Pressure Wound Therapy - Incisional   Complete by: As directed    Show patient how to attach [place]    Newt Minion, MD In 1 week.   Specialty: Orthopedic Surgery Contact information: [place] [address] [phone]        [facility] Follow up.   Why: IV antibiotics will be coordinated through [facility].  Please call [phone] with any questions about your IV antibiotic regimen.       [facility], [facility] Follow up.   Specialty: Home Health Services Why: Alvis Lemmings will be providing services for RN/ PT/OT services.  You should receive a call with in 24-48 hours of your discharge home. Contact information: [address] [place] [address] [phone]        [facility], [facility] Follow up.   Why: [facility] will be supplying you with a wheelchair, rolling walker, and 3:1 for home. Contact information: [address] [address] [phone]            Signed: Bevely Palmer Havanah Nelms 08/20/2019, 11:47 AM

## 2019-08-21 ENCOUNTER — Other Ambulatory Visit: Payer: Self-pay

## 2019-08-21 ENCOUNTER — Encounter: Payer: Self-pay | Admitting: Orthopedic Surgery

## 2019-08-21 ENCOUNTER — Ambulatory Visit (INDEPENDENT_AMBULATORY_CARE_PROVIDER_SITE_OTHER): Payer: Medicare Other | Admitting: Family

## 2019-08-21 VITALS — Ht 64.0 in | Wt 187.0 lb

## 2019-08-21 DIAGNOSIS — Z945 Skin transplant status: Secondary | ICD-10-CM

## 2019-08-21 DIAGNOSIS — L97929 Non-pressure chronic ulcer of unspecified part of left lower leg with unspecified severity: Secondary | ICD-10-CM

## 2019-08-21 DIAGNOSIS — L97919 Non-pressure chronic ulcer of unspecified part of right lower leg with unspecified severity: Secondary | ICD-10-CM

## 2019-08-21 DIAGNOSIS — I87333 Chronic venous hypertension (idiopathic) with ulcer and inflammation of bilateral lower extremity: Secondary | ICD-10-CM

## 2019-08-21 DIAGNOSIS — L97313 Non-pressure chronic ulcer of right ankle with necrosis of muscle: Secondary | ICD-10-CM

## 2019-08-22 ENCOUNTER — Telehealth: Payer: Self-pay

## 2019-08-22 ENCOUNTER — Encounter: Payer: Self-pay | Admitting: Family

## 2019-08-22 ENCOUNTER — Telehealth: Payer: Self-pay | Admitting: Orthopedic Surgery

## 2019-08-22 NOTE — Progress Notes (Signed)
   Post-Op Visit Note   Patient: Kaitlin Hudson           Date of Birth: 10/03/1955           MRN: [id number] Visit Date: 08/21/2019 PCP: Earley Abide. (Inactive)  Chief Complaint:  Chief Complaint  Patient presents with  . Right Foot - Routine Post Op    08/15/19 right achilles debridement     HPI:  HPI The patient 64 year old woman who presents today that is post right Achilles debridement with split thickness skin grafting on June 2.  Her wound VAC was removed today.  She is in a PRAFO boot.  She has been nonweightbearing she is in a wheelchair her husband is providing most of her care and will be doing her dressing changes moving forward she does have home health nursing assistant for her PICC line she is receiving vancomycin as well as Rocephin. Ortho Exam On examination of the right lower extremity.  The skin graft is in place there are staples holding in place there is no surrounding erythema no surrounding maceration no odor this is quite tender for the patient.  There is no active drainage.  Visit Diagnoses:  1. Ankle ulcer, right, with necrosis of muscle (HCC)   2. 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 H/O skin graft     Plan: Dressing applied Ace wrap as well this PRAFO reapplied she will work on elevation they will begin daily Dial soap cleansing.  Vaseline gauze dressing changes.  Follow-Up Instructions: No follow-ups on file.   Imaging: No results found.  Orders:  No orders of the defined types were placed in this encounter.  No orders of the defined types were placed in this encounter.    PMFS History: Patient Active Problem List   Diagnosis Date Noted  . Heel ulcer, right, with necrosis of muscle (HCC)   . Chronic diastolic heart failure (HCC) 09/12/2013  . Rheumatoid arthritis (HCC) 09/12/2013  . IDDM (insulin dependent diabetes mellitus) 09/09/2013  . Cellulitis of right lower  extremity 09/08/2013   Past Medical History:  Diagnosis Date  . Arthritis, rheumatoid (HCC)   . CHF (congestive heart failure) (HCC)   . Diabetes mellitus without complication (HCC)   . Hyperlipidemia   . Hypertension   . Latent tuberculosis   . Type 1 diabetes (HCC)     No family history on file.  Past Surgical History:  Procedure Laterality Date  . CARPAL TUNNEL RELEASE Bilateral   . FOOT SURGERY    . I & D EXTREMITY Right 08/15/2019   Procedure: RIGHT ACHILLES DEBRIDEMENT, POSSIBLE SKIN GRAFT;  Surgeon: Nadara Mustard, MD;  Location: [facility] OR;  Service: Orthopedics;  Laterality: Right;  . JOINT REPLACEMENT     Social History   Occupational History  . Not on file  Tobacco Use  . Smoking status: Never Smoker  . Smokeless tobacco: Never Used  Substance and Sexual Activity  . Alcohol use: No  . Drug use: No  . Sexual activity: Not on file

## 2019-08-22 NOTE — Telephone Encounter (Signed)
Called to advise that pt is s/p an achilles debridement and that she was evaluated in the office yesterday. Called [facility] [phone]  to advise that the wound care orders are to wash with soap and water and apply a vasaline based gauze 4x4, gauze and ace for compression. Pt is to do this daily has a teachable caregiver at home that can do the dressing changes and they will also help pt obtain dressing supplies.  Pt has been made aware and will call with questions.

## 2019-08-22 NOTE — Telephone Encounter (Signed)
Rha with [facility] called wanting to put in a verbal order for OPT; the frequency is once a week for 6 weeks.   Rha CB# [phone]

## 2019-08-23 NOTE — Telephone Encounter (Signed)
Called and sw rha to advise verbal ok for orders below. To call with questions.

## 2019-08-24 ENCOUNTER — Telehealth: Payer: Self-pay | Admitting: Orthopedic Surgery

## 2019-08-24 NOTE — Telephone Encounter (Signed)
I called and sw Heather and advised that per Denny Peon pt is not being seen by infectious dx so we would give orders for PICC line. Advised that pharmacy will dose and treat and continue with wound care as instructed Wednesday. Will call with any questions.

## 2019-08-24 NOTE — Telephone Encounter (Signed)
Herbert Seta, Director of [facility] called wanting to know if Dr. Lajoyce Corners would be willing to give VO's for wound care and also for the patient's PICC Line.  CB#[phone].  Thank you.

## 2019-08-28 ENCOUNTER — Encounter: Payer: Self-pay | Admitting: Orthopedic Surgery

## 2019-08-28 ENCOUNTER — Other Ambulatory Visit: Payer: Self-pay

## 2019-08-28 ENCOUNTER — Ambulatory Visit (INDEPENDENT_AMBULATORY_CARE_PROVIDER_SITE_OTHER): Payer: Medicare Other | Admitting: Orthopedic Surgery

## 2019-08-28 VITALS — Ht 64.0 in | Wt 187.0 lb

## 2019-08-28 DIAGNOSIS — Z945 Skin transplant status: Secondary | ICD-10-CM

## 2019-08-28 NOTE — Progress Notes (Signed)
Office Visit Note   Patient: Kaitlin Hudson           Date of Birth: September 30, 1955           MRN: [id number] Visit Date: 08/28/2019              Requested by: No referring provider defined for this encounter. PCP: Jeannine Boga (Inactive)  Chief Complaint  Patient presents with  . Right Ankle - Routine Post Op    08/15/19 right achilles debridement       HPI: The patient is a 64 year old woman who presents today status post right Achilles debridement with skin grafting June 02 of this year she has been using a PRAFO boot and doing Vaseline impregnated gauze dressing changes.  She has been feeling very good today she has difficulty sleeping due to burning pain over the area of the wound.  She has a history of significant lower extremity edema.  Assessment & Plan: Visit Diagnoses:  1. H/O skin graft     Plan: Patient very resistant to trialing gabapentin for her pain.  Nervous for worsening of her lower extremity edema as well as drowsiness.  We will attempt to get Lyrica prior authorized by her insurance.  She will continue with her current wound care daily cleansing and dressing changes.  She will follow-up with Dr. Sharol Given in 1 week  Follow-Up Instructions: Return in about 9 days (around 09/06/2019).   Ortho Exam  Patient is alert, oriented, no adenopathy, well-dressed, normal affect, normal respiratory effort. On examination of the right lower extremity patient has 2+ pitting edema there is no weeping no open ulcerations anteriorly.  Edema equal bilaterally.  The skin graft is in place to posterior heel.  Staples are in place.  There is no surrounding maceration no odor no visible active drainage.  There is tenderness diffusely.  Imaging: No results found. No images are attached to the encounter.  Labs: Lab Results  Component Value Date   HGBA1C 7.2 (H) 08/15/2019   LABURIC 2.5 09/11/2013   REPTSTATUS 08/20/2019 FINAL 08/15/2019   GRAMSTAIN  08/15/2019    RARE WBC  PRESENT, PREDOMINANTLY PMN ABUNDANT GRAM POSITIVE RODS FEW GRAM POSITIVE COCCI RARE GRAM VARIABLE ROD    CULT  08/15/2019    ABUNDANT CORYNEBACTERIUM STRIATUM ABUNDANT HAEMOPHILUS PARAINFLUENZAE BETA LACTAMASE NEGATIVE NO ANAEROBES ISOLATED Performed at [facility], [place] [address], [place], [place] [address]    LABORGA ESCHERICHIA COLI (A) 11/07/2015     Lab Results  Component Value Date   ALBUMIN 3.1 (L) 07/21/2019   ALBUMIN 3.2 (L) 11/07/2015   LABURIC 2.5 09/11/2013    No results found for: MG No results found for: VD25OH  No results found for: PREALBUMIN CBC EXTENDED Latest Ref Rng & Units 08/15/2019 07/21/2019 11/07/2015  WBC 4.0 - 10.5 K/uL 10.2 13.9(H) 12.2(H)  RBC 3.87 - 5.11 MIL/uL 4.55 4.43 4.06  HGB 12.0 - 15.0 g/dL 13.5 13.2 11.7(L)  HCT 36 - 46 % 43.0 40.2 35.4(L)  PLT 150 - 400 K/uL 238 233 250  NEUTROABS 1.7 - 7.7 K/uL - 11.7(H) 8.2(H)  LYMPHSABS 0.7 - 4.0 K/uL - 0.7 2.3     Body mass index is 32.1 kg/m.  Orders:  No orders of the defined types were placed in this encounter.  No orders of the defined types were placed in this encounter.    Procedures: No procedures performed  Clinical Data: No additional findings.  ROS:  All other systems negative,  except as noted in the HPI. Review of Systems  Objective: Vital Signs: Ht 5\' 4"  (1.626 m)   Wt 187 lb (84.8 kg)   BMI 32.10 kg/m   Specialty Comments:  No specialty comments available.  PMFS History: Patient Active Problem List   Diagnosis Date Noted  . Heel ulcer, right, with necrosis of muscle (HCC)   . Chronic diastolic heart failure (HCC) 09/12/2013  . Rheumatoid arthritis (HCC) 09/12/2013  . IDDM (insulin dependent diabetes mellitus) 09/09/2013  . Cellulitis of right lower extremity 09/08/2013   Past Medical History:  Diagnosis Date  . Arthritis, rheumatoid (HCC)   . CHF (congestive heart failure) (HCC)   . Diabetes mellitus without complication (HCC)   . Hyperlipidemia    . Hypertension   . Latent tuberculosis   . Type 1 diabetes (HCC)     History reviewed. No pertinent family history.  Past Surgical History:  Procedure Laterality Date  . CARPAL TUNNEL RELEASE Bilateral   . FOOT SURGERY    . I & D EXTREMITY Right 08/15/2019   Procedure: RIGHT ACHILLES DEBRIDEMENT, POSSIBLE SKIN GRAFT;  Surgeon: 10/15/2019, MD;  Location: [facility] OR;  Service: Orthopedics;  Laterality: Right;  . JOINT REPLACEMENT     Social History   Occupational History  . Not on file  Tobacco Use  . Smoking status: Never Smoker  . Smokeless tobacco: Never Used  Substance and Sexual Activity  . Alcohol use: No  . Drug use: No  . Sexual activity: Not on file

## 2019-09-04 ENCOUNTER — Encounter: Payer: Self-pay | Admitting: Family

## 2019-09-04 ENCOUNTER — Other Ambulatory Visit: Payer: Self-pay

## 2019-09-04 ENCOUNTER — Ambulatory Visit (INDEPENDENT_AMBULATORY_CARE_PROVIDER_SITE_OTHER): Payer: Medicare Other | Admitting: Orthopedic Surgery

## 2019-09-04 VITALS — Ht 64.0 in | Wt 187.0 lb

## 2019-09-04 DIAGNOSIS — L97313 Non-pressure chronic ulcer of right ankle with necrosis of muscle: Secondary | ICD-10-CM

## 2019-09-05 ENCOUNTER — Encounter: Payer: Self-pay | Admitting: Orthopedic Surgery

## 2019-09-05 NOTE — Progress Notes (Signed)
Office Visit Note   Patient: Kaitlin Hudson           Date of Birth: April 08, 1955           MRN: [id number] Visit Date: 09/04/2019              Requested by: No referring provider defined for this encounter. PCP: Jeannine Boga (Inactive)  Chief Complaint  Patient presents with  . Right Foot - Routine Post Op    08/15/19 right achilles debridement       HPI: Patient is a 64 year old woman who presents in follow-up status post debridement of a large Achilles ulcer and split-thickness skin graft.  Patient is currently using Adaptic and a dry dressing change she is nonweightbearing in a wheelchair uses a PRAFO to unload pressure.  Assessment & Plan: Visit Diagnoses:  1. Ankle ulcer, right, with necrosis of muscle ([place])     Plan: Patient will continue with her vancomycin she has 1 more week continue with elevation and dressing changes daily.  She does have a prescription for Santyl that she is using for the lateral foot ulcer under the supervision of the wound center.  Follow-Up Instructions: Return in about 2 weeks (around 09/18/2019).   Ortho Exam  Patient is alert, oriented, no adenopathy, well-dressed, normal affect, normal respiratory effort. Examination patient does have some swelling there is a small amount of clear drainage.  The excess layers of split thickness was removed there is no cellulitis no signs of infection there is no ascending cellulitis.  Imaging: No results found. No images are attached to the encounter.  Labs: Lab Results  Component Value Date   HGBA1C 7.2 (H) 08/15/2019   LABURIC 2.5 09/11/2013   REPTSTATUS 08/20/2019 FINAL 08/15/2019   GRAMSTAIN  08/15/2019    RARE WBC PRESENT, PREDOMINANTLY PMN ABUNDANT GRAM POSITIVE RODS FEW GRAM POSITIVE COCCI RARE GRAM VARIABLE ROD    CULT  08/15/2019    ABUNDANT CORYNEBACTERIUM STRIATUM ABUNDANT HAEMOPHILUS PARAINFLUENZAE BETA LACTAMASE NEGATIVE NO ANAEROBES ISOLATED Performed at [facility],  [place] [address] [place], [place] [address]    LABORGA ESCHERICHIA COLI (A) 11/07/2015     Lab Results  Component Value Date   ALBUMIN 3.1 (L) 07/21/2019   ALBUMIN 3.2 (L) 11/07/2015   LABURIC 2.5 09/11/2013    No results found for: MG No results found for: VD25OH  No results found for: PREALBUMIN CBC EXTENDED Latest Ref Rng & Units 08/15/2019 07/21/2019 11/07/2015  WBC 4.0 - 10.5 K/uL 10.2 13.9(H) 12.2(H)  RBC 3.87 - 5.11 MIL/uL 4.55 4.43 4.06  HGB 12.0 - 15.0 g/dL 13.5 13.2 11.7(L)  HCT 36 - 46 % 43.0 40.2 35.4(L)  PLT 150 - 400 K/uL 238 233 250  NEUTROABS 1.7 - 7.7 K/uL - 11.7(H) 8.2(H)  LYMPHSABS 0.7 - 4.0 K/uL - 0.7 2.3     Body mass index is 32.1 kg/m.  Orders:  No orders of the defined types were placed in this encounter.  No orders of the defined types were placed in this encounter.    Procedures: No procedures performed  Clinical Data: No additional findings.  ROS:  All other systems negative, except as noted in the HPI. Review of Systems  Objective: Vital Signs: Ht 5\' 4"  (1.626 m)   Wt 187 lb (84.8 kg)   BMI 32.10 kg/m   Specialty Comments:  No specialty comments available.  PMFS History: Patient Active Problem List   Diagnosis Date Noted  . Heel ulcer,  right, with necrosis of muscle (HCC)   . Chronic diastolic heart failure (HCC) 09/12/2013  . Rheumatoid arthritis (HCC) 09/12/2013  . IDDM (insulin dependent diabetes mellitus) 09/09/2013  . Cellulitis of right lower extremity 09/08/2013   Past Medical History:  Diagnosis Date  . Arthritis, rheumatoid (HCC)   . CHF (congestive heart failure) (HCC)   . Diabetes mellitus without complication (HCC)   . Hyperlipidemia   . Hypertension   . Latent tuberculosis   . Type 1 diabetes (HCC)     History reviewed. No pertinent family history.  Past Surgical History:  Procedure Laterality Date  . CARPAL TUNNEL RELEASE Bilateral   . FOOT SURGERY    . I & D EXTREMITY Right 08/15/2019   Procedure: RIGHT  ACHILLES DEBRIDEMENT, POSSIBLE SKIN GRAFT;  Surgeon: Nadara Mustard, MD;  Location: [facility] OR;  Service: Orthopedics;  Laterality: Right;  . JOINT REPLACEMENT     Social History   Occupational History  . Not on file  Tobacco Use  . Smoking status: Never Smoker  . Smokeless tobacco: Never Used  Substance and Sexual Activity  . Alcohol use: No  . Drug use: No  . Sexual activity: Not on file

## 2019-09-06 ENCOUNTER — Telehealth: Payer: Self-pay | Admitting: Orthopedic Surgery

## 2019-09-06 NOTE — Telephone Encounter (Signed)
Received call from Jeri Modena with [facility] needing order to to pull picc line after antibiotics are finished 09/10/2019. PICC line will be pulled 09/11/2019. The number to contact Pam is [phone]

## 2019-09-07 NOTE — Telephone Encounter (Signed)
Ok per [employer]. I called Pam and advised.

## 2019-09-11 ENCOUNTER — Telehealth: Payer: Self-pay | Admitting: Orthopedic Surgery

## 2019-09-11 NOTE — Telephone Encounter (Signed)
Marchelle Folks with [facility] called wanting to make Dr.Duda aware the pt finished her anti-biotics and they would be sending someone to reassess her.   Marchelle Folks CB# [phone]

## 2019-09-11 NOTE — Telephone Encounter (Signed)
Noted pt has follow up in office next Tuesday.

## 2019-09-18 ENCOUNTER — Ambulatory Visit (INDEPENDENT_AMBULATORY_CARE_PROVIDER_SITE_OTHER): Payer: Medicare Other | Admitting: Orthopedic Surgery

## 2019-09-18 ENCOUNTER — Other Ambulatory Visit: Payer: Self-pay

## 2019-09-18 DIAGNOSIS — Z945 Skin transplant status: Secondary | ICD-10-CM

## 2019-09-18 DIAGNOSIS — L97313 Non-pressure chronic ulcer of right ankle with necrosis of muscle: Secondary | ICD-10-CM

## 2019-09-21 ENCOUNTER — Encounter: Payer: Self-pay | Admitting: Orthopedic Surgery

## 2019-09-21 NOTE — Progress Notes (Signed)
Office Visit Note   Patient: Kaitlin Hudson           Date of Birth: 09-12-1955           MRN: [id number] Visit Date: 09/18/2019              Requested by: No referring provider defined for this encounter. PCP: Earley Abide (Inactive)  Chief Complaint  Patient presents with  . Right Ankle - Routine Post Op    08/15/19 right achilles debridement   . Right Foot - Follow-up      HPI: Patient is a 64 year old woman who presents 1 month status post debridement of large necrotic Achilles ulcer with application of skin graft.  Patient denies any pain or concerns.  Assessment & Plan: Visit Diagnoses:  1. Ankle ulcer, right, with necrosis of muscle (HCC)   2. H/O skin graft     Plan: We will continue with Silvadene dressing changes continue with pressure unloading with a PRAFO boot.  Follow-Up Instructions: Return in about 1 week (around 09/25/2019).   Ortho Exam  Patient is alert, oriented, no adenopathy, well-dressed, normal affect, normal respiratory effort. Examination patient does have a palpable dorsalis pedis pulse there is pitting edema in the right lower extremity there is no cellulitis several layers of redundant skin graft are removed the base skin graft has petechial bleeding.  Imaging: No results found. No images are attached to the encounter.  Labs: Lab Results  Component Value Date   HGBA1C 7.2 (H) 08/15/2019   LABURIC 2.5 09/11/2013   REPTSTATUS 08/20/2019 FINAL 08/15/2019   GRAMSTAIN  08/15/2019    RARE WBC PRESENT, PREDOMINANTLY PMN ABUNDANT GRAM POSITIVE RODS FEW GRAM POSITIVE COCCI RARE GRAM VARIABLE ROD    CULT  08/15/2019    ABUNDANT CORYNEBACTERIUM STRIATUM ABUNDANT HAEMOPHILUS PARAINFLUENZAE BETA LACTAMASE NEGATIVE NO ANAEROBES ISOLATED Performed at [facility], [address] [place], [place] [address]    LABORGA ESCHERICHIA COLI (A) 11/07/2015     Lab Results  Component Value Date   ALBUMIN 3.1 (L) 07/21/2019   ALBUMIN 3.2  (L) 11/07/2015   LABURIC 2.5 09/11/2013    No results found for: MG No results found for: VD25OH  No results found for: PREALBUMIN CBC EXTENDED Latest Ref Rng & Units 08/15/2019 07/21/2019 11/07/2015  WBC 4.0 - 10.5 K/uL 10.2 13.9(H) 12.2(H)  RBC 3.87 - 5.11 MIL/uL 4.55 4.43 4.06  HGB 12.0 - 15.0 g/dL 66.4 40.3 11.7(L)  HCT 36 - 46 % 43.0 40.2 35.4(L)  PLT 150 - 400 K/uL 238 233 250  NEUTROABS 1.7 - 7.7 K/uL - 11.7(H) 8.2(H)  LYMPHSABS 0.7 - 4.0 K/uL - 0.7 2.3     There is no height or weight on file to calculate BMI.  Orders:  No orders of the defined types were placed in this encounter.  No orders of the defined types were placed in this encounter.    Procedures: No procedures performed  Clinical Data: No additional findings.  ROS:  All other systems negative, except as noted in the HPI. Review of Systems  Objective: Vital Signs: There were no vitals taken for this visit.  Specialty Comments:  No specialty comments available.  PMFS History: Patient Active Problem List   Diagnosis Date Noted  . Heel ulcer, right, with necrosis of muscle (HCC)   . Chronic diastolic heart failure (HCC) 09/12/2013  . Rheumatoid arthritis (HCC) 09/12/2013  . IDDM (insulin dependent diabetes mellitus) 09/09/2013  . Cellulitis of right  lower extremity 09/08/2013   Past Medical History:  Diagnosis Date  . Arthritis, rheumatoid (HCC)   . CHF (congestive heart failure) (HCC)   . Diabetes mellitus without complication (HCC)   . Hyperlipidemia   . Hypertension   . Latent tuberculosis   . Type 1 diabetes (HCC)     History reviewed. No pertinent family history.  Past Surgical History:  Procedure Laterality Date  . CARPAL TUNNEL RELEASE Bilateral   . FOOT SURGERY    . I & D EXTREMITY Right 08/15/2019   Procedure: RIGHT ACHILLES DEBRIDEMENT, POSSIBLE SKIN GRAFT;  Surgeon: Nadara Mustard, MD;  Location: [facility] OR;  Service: Orthopedics;  Laterality: Right;  . JOINT REPLACEMENT     Social  History   Occupational History  . Not on file  Tobacco Use  . Smoking status: Never Smoker  . Smokeless tobacco: Never Used  Substance and Sexual Activity  . Alcohol use: No  . Drug use: No  . Sexual activity: Not on file

## 2019-09-24 ENCOUNTER — Telehealth: Payer: Self-pay | Admitting: Orthopedic Surgery

## 2019-09-24 NOTE — Telephone Encounter (Signed)
Tammy from [facility] called.   Notifying us that the patient missed her visit this week. Wont be seen until Monday 10/01/2019  Call back: [phone]

## 2019-09-24 NOTE — Telephone Encounter (Signed)
Okay, thank you. Noted

## 2019-09-25 ENCOUNTER — Encounter: Payer: Self-pay | Admitting: Orthopedic Surgery

## 2019-09-25 ENCOUNTER — Other Ambulatory Visit: Payer: Self-pay

## 2019-09-25 ENCOUNTER — Ambulatory Visit (INDEPENDENT_AMBULATORY_CARE_PROVIDER_SITE_OTHER): Payer: Medicare Other | Admitting: Orthopedic Surgery

## 2019-09-25 VITALS — Ht 64.0 in | Wt 187.0 lb

## 2019-09-25 DIAGNOSIS — L97929 Non-pressure chronic ulcer of unspecified part of left lower leg with unspecified severity: Secondary | ICD-10-CM

## 2019-09-25 DIAGNOSIS — L97313 Non-pressure chronic ulcer of right ankle with necrosis of muscle: Secondary | ICD-10-CM

## 2019-09-25 DIAGNOSIS — I87333 Chronic venous hypertension (idiopathic) with ulcer and inflammation of bilateral lower extremity: Secondary | ICD-10-CM

## 2019-09-25 DIAGNOSIS — L97919 Non-pressure chronic ulcer of unspecified part of right lower leg with unspecified severity: Secondary | ICD-10-CM

## 2019-10-01 ENCOUNTER — Telehealth: Payer: Self-pay | Admitting: Orthopedic Surgery

## 2019-10-01 NOTE — Telephone Encounter (Signed)
Received call from Adasia with [facility] she advised medicare denied code C79.2. Adasia asked is there another code that can be used? The number to contact Adasia is [phone]

## 2019-10-04 ENCOUNTER — Encounter: Payer: Self-pay | Admitting: Orthopedic Surgery

## 2019-10-04 ENCOUNTER — Telehealth: Payer: Self-pay | Admitting: Orthopedic Surgery

## 2019-10-04 NOTE — Telephone Encounter (Signed)
Amanda called. She is the PT working with Mrs. Harlan. She would like orders for 2 wk 1, 1wk 4 for cont. Wound care. Her call back number is [phone]

## 2019-10-04 NOTE — Progress Notes (Signed)
Office Visit Note   Patient: Kaitlin Hudson           Date of Birth: October 03, 1955           MRN: [id number] Visit Date: 09/25/2019              Requested by: No referring provider defined for this encounter. PCP: Earley Abide (Inactive)  Chief Complaint  Patient presents with  . Right Ankle - Routine Post Op     08/15/19 right achilles debridement         HPI: Patient is a 64 year old woman who presents 6 weeks status post skin graft right ankle.  Patient states she has had increased drainage she has been changing the dressing daily.  Patient states she does have a follow-up appointment with her cardiologist this week including a stress test for her congestive heart failure.  Assessment & Plan: Visit Diagnoses:  1. Ankle ulcer, right, with necrosis of muscle (HCC)   2. 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  We will give her a new PRAFO staples are harvested increase dressing changes to twice a day.  Follow-Up Instructions: Return in about 2 weeks (around 10/09/2019).   Ortho Exam  Patient is alert, oriented, no adenopathy, well-dressed, normal affect, normal respiratory effort. Examination patient has massive doughy pitting edema in the foot and ankle.  There is no cellulitis there is clear drainage with maceration of the skin graft.  Staples are harvested dry dressing applied  Imaging: No results found. No images are attached to the encounter.  Labs: Lab Results  Component Value Date   HGBA1C 7.2 (H) 08/15/2019   LABURIC 2.5 09/11/2013   REPTSTATUS 08/20/2019 FINAL 08/15/2019   GRAMSTAIN  08/15/2019    RARE WBC PRESENT, PREDOMINANTLY PMN ABUNDANT GRAM POSITIVE RODS FEW GRAM POSITIVE COCCI RARE GRAM VARIABLE ROD     CULT  08/15/2019    ABUNDANT CORYNEBACTERIUM STRIATUM ABUNDANT HAEMOPHILUS PARAINFLUENZAE BETA LACTAMASE NEGATIVE NO ANAEROBES ISOLATED Performed at [facility], [address] [place], [place] [address]    LABORGA ESCHERICHIA COLI (A) 11/07/2015     Lab Results  Component Value Date   ALBUMIN 3.1 (L) 07/21/2019   ALBUMIN 3.2 (L) 11/07/2015   LABURIC 2.5 09/11/2013    No results found for: MG No results found for: VD25OH  No results found for: PREALBUMIN CBC EXTENDED Latest Ref Rng & Units 08/15/2019 07/21/2019 11/07/2015  WBC 4.0 - 10.5 K/uL 10.2 13.9(H) 12.2(H)  RBC 3.87 - 5.11 MIL/uL 4.55 4.43 4.06  HGB 12.0 - 15.0 g/dL 85.2 77.8 11.7(L)  HCT 36 - 46 % 43.0 40.2 35.4(L)  PLT 150 - 400 K/uL 238 233 250  NEUTROABS 1.7 - 7.7 K/uL - 11.7(H) 8.2(H)  LYMPHSABS 0.7 - 4.0 K/uL - 0.7 2.3     Body mass index is 32.1 kg/m.  Orders:  No orders of the defined types were placed in this encounter.  No orders of the defined types were placed in this encounter.    Procedures: No procedures performed  Clinical Data: No additional findings.  ROS:  All other systems negative, except as noted in the HPI. Review of Systems  Objective: Vital Signs: Ht 5\' 4"  (1.626 m)   Wt 187 lb (84.8  kg)   BMI 32.10 kg/m   Specialty Comments:  No specialty comments available.  PMFS History: Patient Active Problem List   Diagnosis Date Noted  . Heel ulcer, right, with necrosis of muscle (HCC)   . Chronic diastolic heart failure (HCC) 09/12/2013  . Rheumatoid arthritis (HCC) 09/12/2013  . IDDM (insulin dependent diabetes mellitus) 09/09/2013  . Cellulitis of right lower extremity 09/08/2013   Past Medical History:  Diagnosis Date  . Arthritis, rheumatoid (HCC)   . CHF (congestive heart failure) (HCC)   . Diabetes mellitus without complication (HCC)   . Hyperlipidemia   . Hypertension   . Latent tuberculosis   . Type 1 diabetes (HCC)     History reviewed. No pertinent  family history.  Past Surgical History:  Procedure Laterality Date  . CARPAL TUNNEL RELEASE Bilateral   . FOOT SURGERY    . I & D EXTREMITY Right 08/15/2019   Procedure: RIGHT ACHILLES DEBRIDEMENT, POSSIBLE SKIN GRAFT;  Surgeon: Nadara Mustard, MD;  Location: [facility] OR;  Service: Orthopedics;  Laterality: Right;  . JOINT REPLACEMENT     Social History   Occupational History  . Not on file  Tobacco Use  . Smoking status: Never Smoker  . Smokeless tobacco: Never Used  Substance and Sexual Activity  . Alcohol use: No  . Drug use: No  . Sexual activity: Not on file

## 2019-10-04 NOTE — Telephone Encounter (Signed)
Number in chart is not correct. Called [facility] nurse # [phone] to give verbal ok for wound and therapy orders below. To all with questions. LM ON VM

## 2019-10-09 ENCOUNTER — Encounter: Payer: Self-pay | Admitting: Orthopedic Surgery

## 2019-10-09 ENCOUNTER — Ambulatory Visit (INDEPENDENT_AMBULATORY_CARE_PROVIDER_SITE_OTHER): Payer: Medicare Other | Admitting: Orthopedic Surgery

## 2019-10-09 VITALS — Ht 64.0 in | Wt 187.0 lb

## 2019-10-09 DIAGNOSIS — Z945 Skin transplant status: Secondary | ICD-10-CM

## 2019-10-09 DIAGNOSIS — L97929 Non-pressure chronic ulcer of unspecified part of left lower leg with unspecified severity: Secondary | ICD-10-CM

## 2019-10-09 DIAGNOSIS — I87333 Chronic venous hypertension (idiopathic) with ulcer and inflammation of bilateral lower extremity: Secondary | ICD-10-CM

## 2019-10-09 DIAGNOSIS — L97919 Non-pressure chronic ulcer of unspecified part of right lower leg with unspecified severity: Secondary | ICD-10-CM

## 2019-10-09 DIAGNOSIS — L97313 Non-pressure chronic ulcer of right ankle with necrosis of muscle: Secondary | ICD-10-CM

## 2019-10-09 NOTE — Progress Notes (Signed)
Office Visit Note   Patient: Kaitlin Hudson           Date of Birth: 1955-03-26           MRN: [id number] Visit Date: 10/09/2019              Requested by: No referring provider defined for this encounter. PCP: Earley Abide (Inactive)  Chief Complaint  Patient presents with  . Right Achilles Tendon - Routine Post Op    08/15/19 right achilles debridement       HPI: Patient is a 64 year old woman who presents status post skin graft right ankle.    Has been doing twice daily dressing changes due to increased drainage. Following with [facility] wound center for other LE ulcers.   Recently established with cardiology at [facility]. Is planning for heart cath. Would like to be referred to vascular with same group rather than vascular here in [place].  [facility] assisting with wound care.  Assessment & Plan: Visit Diagnoses:  1. H/O skin graft   2. Ankle ulcer, right, with necrosis of muscle (HCC)   3. 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 Continue the PRAFO. Continue the dressing changes to twice a day.  Will refer to vascular at [facility]. Follow up in 2 weeks. vaseline gauze and ace for compression.   Follow-Up Instructions: Return in about 2 weeks (around 10/23/2019).   Ortho Exam  Patient is alert, oriented, no adenopathy, well-dressed, normal affect, normal respiratory effort. Examination patient has doughy pitting edema in the foot and ankle.  There is no cellulitis there is clear drainage with maceration of the skin graft. Clear serous drainage of wound. No surrounding erythema, no odor.  Imaging: No results found. No images are attached to the encounter.  Labs: Lab Results  Component Value Date   HGBA1C 7.2 (H) 08/15/2019   LABURIC 2.5 09/11/2013   REPTSTATUS 08/20/2019 FINAL  08/15/2019   GRAMSTAIN  08/15/2019    RARE WBC PRESENT, PREDOMINANTLY PMN ABUNDANT GRAM POSITIVE RODS FEW GRAM POSITIVE COCCI RARE GRAM VARIABLE ROD    CULT  08/15/2019    ABUNDANT CORYNEBACTERIUM STRIATUM ABUNDANT HAEMOPHILUS PARAINFLUENZAE BETA LACTAMASE NEGATIVE NO ANAEROBES ISOLATED Performed at [facility], [address] [place], [place] [address]    LABORGA ESCHERICHIA COLI (A) 11/07/2015     Lab Results  Component Value Date   ALBUMIN 3.1 (L) 07/21/2019   ALBUMIN 3.2 (L) 11/07/2015   LABURIC 2.5 09/11/2013    No results found for: MG No results found for: VD25OH  No results found for: PREALBUMIN CBC EXTENDED Latest Ref Rng & Units 08/15/2019 07/21/2019 11/07/2015  WBC 4.0 - 10.5 K/uL 10.2 13.9(H) 12.2(H)  RBC 3.87 - 5.11 MIL/uL 4.55 4.43 4.06  HGB 12.0 - 15.0 g/dL 66.0 63.0 11.7(L)  HCT 36 - 46 % 43.0 40.2 35.4(L)  PLT 150 - 400 K/uL 238 233 250  NEUTROABS 1.7 - 7.7 K/uL - 11.7(H) 8.2(H)  LYMPHSABS 0.7 - 4.0 K/uL - 0.7 2.3     Body mass index is 32.1 kg/m.  Orders:  No orders of the defined types were placed in this encounter.  No orders of the defined types were placed in this encounter.    Procedures: No procedures performed  Clinical Data: No additional findings.  ROS:  All other systems negative, except as noted in the HPI. Review of Systems  Constitutional: Negative for chills and fever.  Cardiovascular: Positive for leg swelling.  Skin: Positive for wound. Negative for color change.    Objective: Vital Signs: Ht 5\' 4"  (1.626 m)   Wt 187 lb (84.8 kg)   BMI 32.10 kg/m   Specialty Comments:  No specialty comments available.  PMFS History: Patient Active Problem List   Diagnosis Date Noted  . Heel ulcer, right, with necrosis of muscle (HCC)   . Chronic diastolic heart failure (HCC) 09/12/2013  . Rheumatoid arthritis (HCC) 09/12/2013  . IDDM (insulin dependent diabetes mellitus) 09/09/2013  . Cellulitis of right lower  extremity 09/08/2013   Past Medical History:  Diagnosis Date  . Arthritis, rheumatoid (HCC)   . CHF (congestive heart failure) (HCC)   . Diabetes mellitus without complication (HCC)   . Hyperlipidemia   . Hypertension   . Latent tuberculosis   . Type 1 diabetes (HCC)     History reviewed. No pertinent family history.  Past Surgical History:  Procedure Laterality Date  . CARPAL TUNNEL RELEASE Bilateral   . FOOT SURGERY    . I & D EXTREMITY Right 08/15/2019   Procedure: RIGHT ACHILLES DEBRIDEMENT, POSSIBLE SKIN GRAFT;  Surgeon: 10/15/2019, MD;  Location: [facility] OR;  Service: Orthopedics;  Laterality: Right;  . JOINT REPLACEMENT     Social History   Occupational History  . Not on file  Tobacco Use  . Smoking status: Never Smoker  . Smokeless tobacco: Never Used  Substance and Sexual Activity  . Alcohol use: No  . Drug use: No  . Sexual activity: Not on file

## 2019-10-11 ENCOUNTER — Telehealth: Payer: Self-pay | Admitting: Orthopedic Surgery

## 2019-10-11 NOTE — Telephone Encounter (Signed)
[facility] called requesting referral to [facility] phone number [phone] be re faxed. Please call Center for fax number if not on file. Patient states center stated they never received referral from [place] [place] office. Please contact patient about this matter at [phone].

## 2019-10-11 NOTE — Telephone Encounter (Signed)
Can you please see message below? I see that she had a referral placed for vascular on 10/09/19 but she is asking to go somewhere else? Please let me know what I can do.

## 2019-10-12 NOTE — Telephone Encounter (Signed)
Referral has been faxed over to [facility] at [phone], pt is aware referral has been faxed

## 2019-10-23 ENCOUNTER — Ambulatory Visit (INDEPENDENT_AMBULATORY_CARE_PROVIDER_SITE_OTHER): Payer: Medicare Other | Admitting: Orthopedic Surgery

## 2019-10-23 ENCOUNTER — Encounter: Payer: Self-pay | Admitting: Orthopedic Surgery

## 2019-10-23 VITALS — Ht 64.0 in | Wt 187.0 lb

## 2019-10-23 DIAGNOSIS — L97313 Non-pressure chronic ulcer of right ankle with necrosis of muscle: Secondary | ICD-10-CM

## 2019-10-23 DIAGNOSIS — Z945 Skin transplant status: Secondary | ICD-10-CM

## 2019-10-24 ENCOUNTER — Encounter: Payer: Self-pay | Admitting: Orthopedic Surgery

## 2019-10-24 NOTE — Progress Notes (Signed)
Office Visit Note   Patient: Kaitlin Hudson           Date of Birth: [birth year]           MRN: [id number] Visit Date: 10/23/2019              Requested by: No referring provider defined for this encounter. PCP: Earley Abide (Inactive)  Chief Complaint  Patient presents with  . Right Achilles Tendon - Routine Post Op    08/15/19 right achilles debridement & SG        HPI: Patient is a 64 year old woman who presents status post skin graft right ankle.    Has been doing twice daily dressing changes due to increased drainage. Following with [facility] wound center for other LE ulcers.   Recently established with cardiology at [facility]. Is planning for heart cath tomorrow. Has been evaluated by vascular earlier this week. States she has a blood clot in her left thigh.  HH assisting with wound care. Has recently transitioned to adaptic dressings to right heel.  Assessment & Plan: Visit Diagnoses:  No diagnosis found.  Plan: Discussed the importance of treating the edema, elevation and working the calf muscle to pump the swelling of the leg. Continue the PRAFO. Continue the dressing changes with adaptic. Provided order to [facility] for assistance with supplies.    Follow-Up Instructions: Return in about 3 weeks (around 11/13/2019).   Ortho Exam  Patient is alert, oriented, no adenopathy, well-dressed, normal affect, normal respiratory effort. Examination patient has doughy pitting edema in the foot and ankle, is stable.  There is no cellulitis there is less clear drainage with maceration of the skin graft. New granulation proximal aspect of wound. No surrounding erythema, no odor.  Imaging: No results found. No images are attached to the encounter.  Labs: Lab Results  Component Value Date   HGBA1C 7.2 (H) 08/15/2019   LABURIC 2.5 09/11/2013   REPTSTATUS 08/20/2019 FINAL 08/15/2019   GRAMSTAIN  08/15/2019    RARE WBC PRESENT, PREDOMINANTLY PMN ABUNDANT GRAM POSITIVE  RODS FEW GRAM POSITIVE COCCI RARE GRAM VARIABLE ROD    CULT  08/15/2019    ABUNDANT CORYNEBACTERIUM STRIATUM ABUNDANT HAEMOPHILUS PARAINFLUENZAE BETA LACTAMASE NEGATIVE NO ANAEROBES ISOLATED Performed at [facility] Lab, [address], [place], [place] [address]    LABORGA ESCHERICHIA COLI (A) 11/07/2015     Lab Results  Component Value Date   ALBUMIN 3.1 (L) 07/21/2019   ALBUMIN 3.2 (L) 11/07/2015   LABURIC 2.5 09/11/2013    No results found for: MG No results found for: VD25OH  No results found for: PREALBUMIN CBC EXTENDED Latest Ref Rng & Units 08/15/2019 07/21/2019 11/07/2015  WBC 4.0 - 10.5 K/uL 10.2 13.9(H) 12.2(H)  RBC 3.87 - 5.11 MIL/uL 4.55 4.43 4.06  HGB 12.0 - 15.0 g/dL 19.1 47.8 11.7(L)  HCT 36 - 46 % 43.0 40.2 35.4(L)  PLT 150 - 400 K/uL 238 233 250  NEUTROABS 1.7 - 7.7 K/uL - 11.7(H) 8.2(H)  LYMPHSABS 0.7 - 4.0 K/uL - 0.7 2.3     Body mass index is 32.1 kg/m.  Orders:  No orders of the defined types were placed in this encounter.  No orders of the defined types were placed in this encounter.    Procedures: No procedures performed  Clinical Data: No additional findings.  ROS:  All other systems negative, except as noted in the HPI. Review of Systems  Constitutional: Negative for chills and fever.  Cardiovascular: Positive  for leg swelling.  Skin: Positive for wound. Negative for color change.    Objective: Vital Signs: Ht 5\' 4"  (1.626 m)   Wt 187 lb (84.8 kg)   BMI 32.10 kg/m   Specialty Comments:  No specialty comments available.  PMFS History: Patient Active Problem List   Diagnosis Date Noted  . Heel ulcer, right, with necrosis of muscle (HCC)   . Chronic diastolic heart failure (HCC) 09/12/2013  . Rheumatoid arthritis (HCC) 09/12/2013  . IDDM (insulin dependent diabetes mellitus) 09/09/2013  . Cellulitis of right lower extremity 09/08/2013   Past Medical History:  Diagnosis Date  . Arthritis, rheumatoid (HCC)   . CHF  (congestive heart failure) (HCC)   . Diabetes mellitus without complication (HCC)   . Hyperlipidemia   . Hypertension   . Latent tuberculosis   . Type 1 diabetes (HCC)     No family history on file.  Past Surgical History:  Procedure Laterality Date  . CARPAL TUNNEL RELEASE Bilateral   . FOOT SURGERY    . I & D EXTREMITY Right 08/15/2019   Procedure: RIGHT ACHILLES DEBRIDEMENT, POSSIBLE SKIN GRAFT;  Surgeon: 10/15/2019, MD;  Location: [facility] OR;  Service: Orthopedics;  Laterality: Right;  . JOINT REPLACEMENT     Social History   Occupational History  . Not on file  Tobacco Use  . Smoking status: Never Smoker  . Smokeless tobacco: Never Used  Substance and Sexual Activity  . Alcohol use: No  . Drug use: No  . Sexual activity: Not on file

## 2019-11-02 HISTORY — PX: CARDIAC CATHETERIZATION: SHX172

## 2019-11-07 ENCOUNTER — Encounter: Payer: Self-pay | Admitting: Family

## 2019-11-07 ENCOUNTER — Telehealth: Payer: Self-pay | Admitting: Orthopedic Surgery

## 2019-11-07 ENCOUNTER — Ambulatory Visit (INDEPENDENT_AMBULATORY_CARE_PROVIDER_SITE_OTHER): Payer: Medicare Other | Admitting: Family

## 2019-11-07 VITALS — Ht 64.0 in | Wt 187.0 lb

## 2019-11-07 DIAGNOSIS — L97919 Non-pressure chronic ulcer of unspecified part of right lower leg with unspecified severity: Secondary | ICD-10-CM

## 2019-11-07 DIAGNOSIS — L97929 Non-pressure chronic ulcer of unspecified part of left lower leg with unspecified severity: Secondary | ICD-10-CM

## 2019-11-07 DIAGNOSIS — I87333 Chronic venous hypertension (idiopathic) with ulcer and inflammation of bilateral lower extremity: Secondary | ICD-10-CM

## 2019-11-07 DIAGNOSIS — Z945 Skin transplant status: Secondary | ICD-10-CM

## 2019-11-07 NOTE — Telephone Encounter (Signed)
I called and sw the pt and she will come in today at 2pm

## 2019-11-07 NOTE — Telephone Encounter (Signed)
Patient called requesting a call back from Dr. Audrie Lia nurse. Patient states she had a skin graph done a few weeks ago and the wound is draining and would like to know if that is normal. Please call patient about this matter at [phone].

## 2019-11-07 NOTE — Progress Notes (Signed)
Office Visit Note   Patient: Kaitlin Hudson           Date of Birth: May 17, 1955           MRN: [id number] Visit Date: 11/07/2019              Requested by: No referring provider defined for this encounter. PCP: Earley Abide (Inactive)  Chief Complaint  Patient presents with  . Right Achilles Tendon - Routine Post Op    08/15/19 right achilles debridement       HPI: Patient is a 64 year old woman who presents status post skin graft right ankle.  Seen as a work in today for concern of increased drainage.  She has been doing dressing changes 4 times a day for the last several days she has also had to change the towel that she rests her foot on multiple times   She has had difficulty tolerating many of the different dressings and treatments that we have tried in the past.  Is currently been doing and oil-based gauze dressing, could not tolerate the petroleum-based dressings has been using ABD pads as well as maxipads for extra layers of absorbency.  Reports on her left lower extremity she is using some Santyl as she cannot tolerate some silver cell dressings.  On the right lower extremity the surgical extremity she cannot tolerate compression or elevation due to increase in her pain with elevation or compression.  Continues with edema of the foot and ankle.  Sounds as though the patient has been sleeping and sitting the majority of the day with her lower extremities dependent.  Of note has a blood clot to the left lower extremity, patient states in the thigh.  Is status post heart cath with stent placement just last week at [facility].  Has been doing twice daily dressing changes due to increased drainage. Following with [facility] wound center for other LE ulcers.   Home health assisting with wound care  Assessment & Plan: Visit Diagnoses:  1. H/O skin graft   2. Chronic venous hypertension (idiopathic) with ulcer and inflammation of bilateral lower extremity (HCC)     Plan:  Discussed the importance of treating the edema, elevation and working the calf muscle to pump the swelling of the leg.  We will trial dressing with silver cell to try to work with all of the moisture of her wound.  Ace for compression.  If patient cannot tolerate this due to burning she will switch back to be Adaptic.  Provided order to [facility] for assistance with supplies.  Discussed with the patient concern for need for further surgical intervention irrigation debridement of the wound possibility of skin graft.  She will follow-up with Dr. Lajoyce Corners to discuss this.    Follow-Up Instructions: Return as scheduled.   Ortho Exam  Patient is alert, oriented, no adenopathy, well-dressed, normal affect, normal respiratory effort. Examination patient has doughy pitting edema in the foot and ankle, is stable.  There is no cellulitis.  She has worsening clear drainage from the wound there is slight surrounding maceration.  Unfortunately in the wound bed there is necrotic tissue as well as fibrinous exudative tissue.  In the most proximal aspect of her wound she has a 5 mm x 2 mm island of granulation.  In the center of the wound bed she also has an [place] where granulation is present.  About 90% of her wound unfortunately is filled in with necrotic tissue. No surrounding erythema, no  odor.  Imaging: No results found. No images are attached to the encounter.  Labs: Lab Results  Component Value Date   HGBA1C 7.2 (H) 08/15/2019   LABURIC 2.5 09/11/2013   REPTSTATUS 08/20/2019 FINAL 08/15/2019   GRAMSTAIN  08/15/2019    RARE WBC PRESENT, PREDOMINANTLY PMN ABUNDANT GRAM POSITIVE RODS FEW GRAM POSITIVE COCCI RARE GRAM VARIABLE ROD    CULT  08/15/2019    ABUNDANT CORYNEBACTERIUM STRIATUM ABUNDANT HAEMOPHILUS PARAINFLUENZAE BETA LACTAMASE NEGATIVE NO ANAEROBES ISOLATED Performed at [facility], [address] [place], [place] [address]    LABORGA ESCHERICHIA COLI (A) 11/07/2015     Lab  Results  Component Value Date   ALBUMIN 3.1 (L) 07/21/2019   ALBUMIN 3.2 (L) 11/07/2015   LABURIC 2.5 09/11/2013    No results found for: MG No results found for: VD25OH  No results found for: PREALBUMIN CBC EXTENDED Latest Ref Rng & Units 08/15/2019 07/21/2019 11/07/2015  WBC 4.0 - 10.5 K/uL 10.2 13.9(H) 12.2(H)  RBC 3.87 - 5.11 MIL/uL 4.55 4.43 4.06  HGB 12.0 - 15.0 g/dL 83.1 51.7 11.7(L)  HCT 36 - 46 % 43.0 40.2 35.4(L)  PLT 150 - 400 K/uL 238 233 250  NEUTROABS 1.7 - 7.7 K/uL - 11.7(H) 8.2(H)  LYMPHSABS 0.7 - 4.0 K/uL - 0.7 2.3     Body mass index is 32.1 kg/m.  Orders:  No orders of the defined types were placed in this encounter.  No orders of the defined types were placed in this encounter.    Procedures: No procedures performed  Clinical Data: No additional findings.  ROS:  All other systems negative, except as noted in the HPI. Review of Systems  Constitutional: Negative for chills and fever.  Cardiovascular: Positive for leg swelling.  Skin: Positive for wound. Negative for color change.    Objective: Vital Signs: Ht 5\' 4"  (1.626 m)   Wt 187 lb (84.8 kg)   BMI 32.10 kg/m   Specialty Comments:  No specialty comments available.  PMFS History: Patient Active Problem List   Diagnosis Date Noted  . Heel ulcer, right, with necrosis of muscle (HCC)   . Chronic diastolic heart failure (HCC) 09/12/2013  . Rheumatoid arthritis (HCC) 09/12/2013  . IDDM (insulin dependent diabetes mellitus) 09/09/2013  . Cellulitis of right lower extremity 09/08/2013   Past Medical History:  Diagnosis Date  . Arthritis, rheumatoid (HCC)   . CHF (congestive heart failure) (HCC)   . Diabetes mellitus without complication (HCC)   . Hyperlipidemia   . Hypertension   . Latent tuberculosis   . Type 1 diabetes (HCC)     History reviewed. No pertinent family history.  Past Surgical History:  Procedure Laterality Date  . CARPAL TUNNEL RELEASE Bilateral   . FOOT SURGERY      . I & D EXTREMITY Right 08/15/2019   Procedure: RIGHT ACHILLES DEBRIDEMENT, POSSIBLE SKIN GRAFT;  Surgeon: 10/15/2019, MD;  Location: [facility] OR;  Service: Orthopedics;  Laterality: Right;  . JOINT REPLACEMENT     Social History   Occupational History  . Not on file  Tobacco Use  . Smoking status: Never Smoker  . Smokeless tobacco: Never Used  Substance and Sexual Activity  . Alcohol use: No  . Drug use: No  . Sexual activity: Not on file

## 2019-11-08 ENCOUNTER — Telehealth: Payer: Self-pay | Admitting: Family

## 2019-11-09 ENCOUNTER — Other Ambulatory Visit: Payer: Self-pay

## 2019-11-12 ENCOUNTER — Encounter (HOSPITAL_COMMUNITY): Payer: Self-pay | Admitting: Orthopedic Surgery

## 2019-11-12 ENCOUNTER — Other Ambulatory Visit: Payer: Self-pay

## 2019-11-12 ENCOUNTER — Encounter (HOSPITAL_COMMUNITY): Payer: Self-pay | Admitting: Emergency Medicine

## 2019-11-12 ENCOUNTER — Other Ambulatory Visit (HOSPITAL_COMMUNITY)
Admission: RE | Admit: 2019-11-12 | Discharge: 2019-11-12 | Disposition: A | Discharge status: Home / Self Care | Payer: Medicare Other | Source: Ambulatory Visit | Attending: Orthopedic Surgery | Admitting: Orthopedic Surgery

## 2019-11-12 ENCOUNTER — Other Ambulatory Visit: Payer: Self-pay | Admitting: Physician Assistant

## 2019-11-12 ENCOUNTER — Telehealth: Payer: Self-pay | Admitting: Orthopedic Surgery

## 2019-11-12 DIAGNOSIS — Z20822 Contact with and (suspected) exposure to covid-19: Secondary | ICD-10-CM | POA: Diagnosis not present

## 2019-11-12 DIAGNOSIS — Z01812 Encounter for preprocedural laboratory examination: Secondary | ICD-10-CM | POA: Insufficient documentation

## 2019-11-12 LAB — SARS CORONAVIRUS 2 (TAT 6-24 HRS): SARS Coronavirus 2: NEGATIVE

## 2019-11-12 MED ORDER — OXYCODONE HCL 5 MG PO TABS: 5.0000 mg | ORAL_TABLET | ORAL | 0 refills | Status: DC | PRN

## 2019-11-12 NOTE — Telephone Encounter (Signed)
Percocet renewed

## 2019-11-12 NOTE — Telephone Encounter (Signed)
Patient called.   Requesting a call to answer some questions she has about how to go her medication in preparation for the procedure.   Call back: [phone]

## 2019-11-12 NOTE — Telephone Encounter (Signed)
This pt is sch for an I&D right achilles on Wednesday and is wanting to know if the rx she will receive after surgery can be written so she can pick them up and fill prior to surgery.

## 2019-11-12 NOTE — Progress Notes (Addendum)
Spoke with pt a couple of times today. I called earlier to have her call her Endocrinologist to get instructions about her insulin pump. I also asked her if she was told to hold her Eliquis or Plavix by her cardiologist, she states no she wasn't but she decided that she would not take them as of this morning until surgery. After talking with Rica Mast, NP, I instructed pt to not stop either medication since she had just had a cath done and the cardiologist instructions were for her to continue both until further notice. Pt states she she had 4 stents placed on 11/02/19 but Marylene Land could not see where she had had stents placed. She saw where pt had an arthrectomy done. Pt is a type 1 diabetic. She states that her fasting blood sugar has been between 80-90. I asked her when I called her this afternoon if she had gotten in touch with Dr. Allena Katz, her Endocrinologist, she stated no that she was going to just do what she did the last time Dr. Lajoyce Corners did surgery on her. She states that because she is taking Prednisone it causes her blood sugar to be high in the mornings so she doesn't want to adjust her basal rate. She states that's what she did when she had the cardiac catheterization. I told her to do what she has done before. I did instruct her to check her blood sugar when she gets up Wednesday AM and every 2 hours until she leaves for the hospital. If blood sugar is 70 or below, treat with 1/2 cup of clear juice (apple or cranberry) and recheck blood sugar 15 minutes after drinking juice. If blood sugar continues to be 70 or below, call the Short Stay department and ask to speak to a nurse. She voiced understanding.  Diabetic Coordinator, Christena Deem notified of pt's surgery date, time of arrival and length of surgery. I did tell Carollee Herter that pt did not want to decrease her basal rate prior to surgery.  Covid test done today. Pt states she understands that she will stay in quarantine until she comes to the  hospital on Wednesday.

## 2019-11-12 NOTE — Progress Notes (Signed)
Anesthesia Chart Review: Kaitlin Hudson   Case: [id number] Date/Time: 11/14/19 1439   Procedure: IRRIGATION AND DEBRIDEMENT RIGHT ACHILLES (Right )   Anesthesia type: Choice   Pre-op diagnosis: Necrosis Right Achilles   [facility] ROOM 03 / [facility]   Surgeons: Nadara Mustard, MD       DISCUSSION: Patient is a 64 year old female scheduled for the above procedure.  She is s/p right Achilles excisional debridement of skin and soft tissue, muscle, and tendon with split thickness skin graft and placement of wound VAC on 08/15/2019. In the interim, she was diagnosed with acute LLE DVT (acute DVT left CFV and chronic DVT femoral and popliteal veins 10/19/19) and placed on Eliquis and later underwent DES x4 (total) on 11/02/19 at [facility] and is was on Eliquis, ASA, and Plavix x 1 week with instructions to then change to Eliquis and Plavix for one year. She was see as a work-in at Dr. Audrie Lia office on 11/07/19 with increased drainage from her right ankle wound and is scheduled to see Dr. Lajoyce Corners on 11/13/19 at 3:00 PM.  History includes never smoker, HTN, CHF, CAD (s/p atherectomy/Synergy DES pLCX, atherectomy/Onyx DES mid LAD extending to proximal LM, Onyx DES to mid and proximal RCA 11/02/19), DM1 (diagnosed age 22; has Medtronic 670 G insulin pump), DVT (acute DVT left CFV and chronic DVT femoral and popliteal veins 10/19/19), RA (on chronic steroids), latent TB, headaches.   S/p bedside I&D of pubic abscess by Carlisle Cater, PA-C on 09/28/19 ([facility] [place]). She reported having a left broken toe.   Patient reported that she is going to continue her insulin basal rate as she has done for more recent previous procedures requiring NPO status.    UPDATE 11/13/19 3:45 PM: I had updated anesthesiologist Val Eagle, MD about this case earlier today and was awaiting input from Dr. Lajoyce Corners following 3:00 PM visit. I just received word that Dr. Lajoyce Corners was able to do some bedside debridement in the  office today, so plans for surgery on 11/14/19 are currently on hold.    VS: As documented in [facility] Care Everywhere: 11/09/19 1513  BP: 121/55  Pulse: 92  Temp: 97.5 F (36.4 C)  Resp: 14  SpO2: 99%    PROVIDERS: [name], MD is PCP [facility] Care Everywhere). Next visit 11/26/19. Hezzie Bump, MD is rheumatologist [facility] Care Everywhere). Last visit 10/01/19. Next visit 12/03/19. Sedalia Muta, MD is cardiologist [facility], see Care Everywhere). Last visit 10/31/19 with [facility] 11/02/19. Next visit 12/12/19.  Izell Lincolnia, MD is endocrinologist (Care Everywhere). Last visit 09/28/19. Next visit 01/07/20.  [facility] [place]. See non 10/19/19 by Yolande Jolly, PA-C for chronic venous hypertension and acute LLE DVT and was started on Eliquis. Last visit 10/26/19 with next visit 12/14/19.  Adolm Joseph, MD is Wound Care specialist [facility], see Care Everywhere). Last visit 10/03/19.    LABS: Currently, most recent labs results (see [facility] Care Everywhere) include: Klebsiella pneumoniae 50-100 CFU/mL on 11/09/19 urine culture (Rx: doxycycline 100 mg BID x 10 days), and on 11/03/19 CO2 19, BUN 33, glucose 167, sodium 139, potassium 4.7, chloride 111, creatinine 1.16, calcium 8.4, WBC 10.8, hemoglobin 9.3, hematocrit 28.1, platelets 288.   IMAGES: CT Head 10/19/19 [facility]): IMPRESSION:  - No CT evidence of acute intracranial abnormality.  - Nonspecific 6 mm somewhat rounded dural-based calcification along  the anterior left frontal convexity for which  an incidental tiny  calcified meningioma is difficult to exclude.   CXR 06/29/19 [facility]): FINDINGS:  The heart size and mediastinal contours are within normal limits.  Both lungs are clear. The visualized skeletal structures are  unremarkable.  IMPRESSION:  No active cardiopulmonary disease.    EKG: EKG 11/03/19 [facility]): Per Result Narrative: Ventricular Rate                    118       BPM                  Atrial Rate                        118       BPM                  P-R Interval                       170       ms                    QRS Duration                       64        ms                    Q-T Interval                       296       ms                    QTC                                414       ms                    P Axis                             59        degrees              R Axis                             -24       degrees              T Axis                             55        degrees              Sinus tachycardia  Possible Left atrial enlargement  Nonspecific T wave changes  When compared with ECG of 02-Nov-2019 15:34,  No significant change was found  Confirmed by Graylin Shiver ([id number]) on 11/03/2019 11:21:18 AM    EKG 11/02/19 [facility]): Per Result Narrative: Ventricular Rate                   96        BPM  Atrial Rate                        96        BPM                  P-R Interval                       150       ms                    QRS Duration                       54        ms                    Q-T Interval                       324       ms                    QTC                                409       ms                    P Axis                             54        degrees              R Axis                             -13       degrees              T Axis                             47        degrees              Sinus rhythm  Possible Left atrial enlargement  Otherwise normal  When compared with ECG of 24-Jul-2019 08:48,  No significant change was found  Confirmed by Karlene Lineman [phone]) on 11/04/2019 11:28:21 AM    EKG 07/21/19: Sinus tachycardia at 107 bpm  Atrial premature complex LAE, consider biatrial enlargement Low voltage, extremity and precordial leads Consider anterior infarct Baseline wander in lead(s) I aVR aVL V2 V3 V6 Confirmed by Gwyneth Sprout  [phone]) on 07/21/2019 5:13:03 PM   CV: PCI 11/02/19 [facility] Care Everywhere):  Planned PCI of LAD, LCX, RCA after CTS turndown.  Left femoral artery access.  Started with LCX.  Roto with 1.5 Ines Bloomer then IVUS guided stenting using a 2.25x28 Synergy DES  post dilated on the proximal portion with a 2.5 [place]  Next performed PCI on LAD using Roto 1.5 [place] then IVUS guided stenting  using a 2.75x34 Onyx (changed stent manufacturer for exact length)  extending from the midLAD back into the proximal LMCA.  Mid portion post  dilated with a 3.0 [place] and LM post  dilated with a 4.0  Next switched to RCA.  Stented mid and proximal RCA using a 3.0x38 and then a 3.5x26 Onyx DES.    Attempted to IVUS post but unable to advance IVUS catheter.  Good  angiographic result.     LHC 10/29/19 [facility]): LM: Distal 70% severely calcified  LAD: Proximal 90% severely calcified, distal 70%, first diagonal 99% with  TIMI II flow [small vessel]  Lcx: Proximal 95% severely calcified, distal 40%  RCA: Proximal 90% severely calcified, mid 80%  - Plan:  1. Secondary prevention of coronary artery disease  2.  Evaluation for CABG versus rotational atherectomy assisted PCI  Per 10/31/19 cardiology follow-up, decision made to proceed with complex multi-vessel PCI at [facility]. See PCI 11/02/19.    Vascular study results 10/19/19 (as outlined in 10/19/19 [facility] office note, see Care Everywhere): "ABIs not able to be performed as patient refused to have blood pressure cuffs placed on her ankle due to her pain and wound, also not able to perform venous reflux studies for similar reasons She does unfortunately have findings of an acute DVT in her left lower extremity in her common femoral, she has chronic DVTs in her femoral and popliteal veins"   Echo 09/28/19 [facility]): SUMMARY  Mild left ventricular hypertrophy  The left ventricle is hyperdynamic.  LV ejection fraction = 65-70%.  There is trace tricuspid  regurgitation.  RVSP not able to be calculated.  There is no comparison study available.  Image Quality: Technically difficult.     Past Medical History:  Diagnosis Date   Arthritis, rheumatoid (HCC)    CHF (congestive heart failure) (HCC)    Coronary artery disease    Diabetes mellitus without complication (HCC)    Headache    Hyperlipidemia    Hypertension    Latent tuberculosis    Peripheral vascular disease (HCC)    blood clot in left leg   Type 1 diabetes (HCC)     Past Surgical History:  Procedure Laterality Date   CARPAL TUNNEL RELEASE Bilateral    FOOT SURGERY     I & D EXTREMITY Right 08/15/2019   Procedure: RIGHT ACHILLES DEBRIDEMENT, POSSIBLE SKIN GRAFT;  Surgeon: Nadara Mustard, MD;  Location: [facility] OR;  Service: Orthopedics;  Laterality: Right;   JOINT REPLACEMENT      MEDICATIONS: No current facility-administered medications for this encounter.    acetaminophen (TYLENOL) 500 MG tablet   apixaban (ELIQUIS) 5 MG TABS tablet   cholecalciferol (VITAMIN D) 25 MCG (1000 UNIT) tablet   clopidogrel (PLAVIX) 75 MG tablet   doxycycline (VIBRAMYCIN) 100 MG capsule   lisinopril (ZESTRIL) 40 MG tablet   metoprolol tartrate (LOPRESSOR) 25 MG tablet   polyethylene glycol (MIRALAX / GLYCOLAX) 17 g packet   predniSONE (DELTASONE) 5 MG tablet   SANTYL ointment   Silver-Carboxymethylcellulose (AQUACEL AG ADVANTAGE EX)   bisacodyl (DULCOLAX) 10 MG suppository   docusate sodium (COLACE) 100 MG capsule   glucose blood (ONETOUCH ULTRA) test strip   HUMALOG 100 UNIT/ML injection   Insulin Human (INSULIN PUMP) SOLN   Insulin Syringe-Needle U-100 (INSULIN SYRINGE .3CC/31GX5/16") 31G X 5/16" 0.3 ML MISC   oxyCODONE (OXY IR/ROXICODONE) 5 MG immediate release tablet    Shonna Chock, PA-C Surgical Short Stay/Anesthesiology [facility] Phone [phone] [facility] Phone [phone] 11/13/2019 3:47 PM

## 2019-11-13 ENCOUNTER — Encounter: Payer: Self-pay | Admitting: Orthopedic Surgery

## 2019-11-13 ENCOUNTER — Ambulatory Visit (INDEPENDENT_AMBULATORY_CARE_PROVIDER_SITE_OTHER): Payer: Medicare Other | Admitting: Orthopedic Surgery

## 2019-11-13 DIAGNOSIS — I87332 Chronic venous hypertension (idiopathic) with ulcer and inflammation of left lower extremity: Secondary | ICD-10-CM

## 2019-11-13 DIAGNOSIS — I87331 Chronic venous hypertension (idiopathic) with ulcer and inflammation of right lower extremity: Secondary | ICD-10-CM | POA: Diagnosis not present

## 2019-11-13 DIAGNOSIS — L97919 Non-pressure chronic ulcer of unspecified part of right lower leg with unspecified severity: Secondary | ICD-10-CM

## 2019-11-13 DIAGNOSIS — Z945 Skin transplant status: Secondary | ICD-10-CM

## 2019-11-13 DIAGNOSIS — L97929 Non-pressure chronic ulcer of unspecified part of left lower leg with unspecified severity: Secondary | ICD-10-CM

## 2019-11-13 NOTE — Telephone Encounter (Signed)
I called patient and advised. She has appt to see Dr. Lajoyce Corners this afternoon as well.

## 2019-11-13 NOTE — Progress Notes (Signed)
Office Visit Note   Patient: Kaitlin Hudson           Date of Birth: 01-24-1956           MRN: [id number] Visit Date: 11/13/2019              Requested by: No referring provider defined for this encounter. PCP: Earley Abide (Inactive)  Chief Complaint  Patient presents with  . Right Ankle - Follow-up      HPI: Patient is a 64 year old woman who presents she is status post extensive debridement right Achilles tendon and placement of skin graft.  Patient is initially scheduled for repeat debridement tomorrow.  Recently patient has had multiple cardiac stents placed and is currently under treatment for a DVT of the superficial femoral vein.  Patient states she also sustained a fracture of the fourth toe left foot yesterday and radiographs were obtained at [facility] urgent care clinic.  Assessment & Plan: Visit Diagnoses:  1. 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 H/O skin graft     Plan: With patient's new cardiac stents and active treatment for DVT I feel it is best to continue serial debridement in the office rather than expose her to the risk of the additional stress of surgery.  Plan for dry dressing changes 3 times a day continue with the [facility]  Follow-Up Instructions: Return in about 1 week (around 11/20/2019).   Ortho Exam  Patient is alert, oriented, no adenopathy, well-dressed, normal affect, normal respiratory effort. Examination patient has necrosis of some of the layers of the split-thickness skin graft.  After informed consent several layers of the split-thickness skin graft were sharply debrided with a 10 blade knife there is some petechial bleeding around the wound edges there is some maceration as well.  There is no cellulitis no signs of any deep infection.  A bulky dry dressing is applied.  Imaging: No results found. No images are attached to the encounter.  Labs: Lab Results    Component Value Date   HGBA1C 7.2 (H) 08/15/2019   LABURIC 2.5 09/11/2013   REPTSTATUS 08/20/2019 FINAL 08/15/2019   GRAMSTAIN  08/15/2019    RARE WBC PRESENT, PREDOMINANTLY PMN ABUNDANT GRAM POSITIVE RODS FEW GRAM POSITIVE COCCI RARE GRAM VARIABLE ROD    CULT  08/15/2019    ABUNDANT CORYNEBACTERIUM STRIATUM ABUNDANT HAEMOPHILUS PARAINFLUENZAE BETA LACTAMASE NEGATIVE NO ANAEROBES ISOLATED Performed at [facility] Lab, [address], [place], [place] [address]    LABORGA ESCHERICHIA COLI (A) 11/07/2015     Lab Results  Component Value Date   ALBUMIN 3.1 (L) 07/21/2019   ALBUMIN 3.2 (L) 11/07/2015   LABURIC 2.5 09/11/2013    No results found for: MG No results found for: VD25OH  No results found for: PREALBUMIN CBC EXTENDED Latest Ref Rng & Units 08/15/2019 07/21/2019 11/07/2015  WBC 4.0 - 10.5 K/uL 10.2 13.9(H) 12.2(H)  RBC 3.87 - 5.11 MIL/uL 4.55 4.43 4.06  HGB 12.0 - 15.0 g/dL 40.1 02.7 11.7(L)  HCT 36 - 46 % 43.0 40.2 35.4(L)  PLT 150 - 400 K/uL 238 233 250  NEUTROABS 1.7 - 7.7 K/uL - 11.7(H) 8.2(H)  LYMPHSABS 0.7 - 4.0 K/uL - 0.7 2.3     There is no height or weight on file to calculate BMI.  Orders:  No orders of the defined types were placed in this encounter.  No orders of the defined types were placed in  this encounter.    Procedures: No procedures performed  Clinical Data: No additional findings.  ROS:  All other systems negative, except as noted in the HPI. Review of Systems  Objective: Vital Signs: There were no vitals taken for this visit.  Specialty Comments:  No specialty comments available.  PMFS History: Patient Active Problem List   Diagnosis Date Noted  . Heel ulcer, right, with necrosis of muscle (HCC)   . Chronic diastolic heart failure (HCC) 09/12/2013  . Rheumatoid arthritis (HCC) 09/12/2013  . IDDM (insulin dependent diabetes mellitus) 09/09/2013  . Cellulitis of right lower extremity 09/08/2013   Past Medical  History:  Diagnosis Date  . Arthritis, rheumatoid (HCC)   . CHF (congestive heart failure) (HCC)   . Coronary artery disease   . Diabetes mellitus without complication (HCC)   . Headache   . Hyperlipidemia   . Hypertension   . Latent tuberculosis   . Peripheral vascular disease (HCC)    blood clot in left leg  . Type 1 diabetes (HCC)     History reviewed. No pertinent family history.  Past Surgical History:  Procedure Laterality Date  . CARPAL TUNNEL RELEASE Bilateral   . FOOT SURGERY    . I & D EXTREMITY Right 08/15/2019   Procedure: RIGHT ACHILLES DEBRIDEMENT, POSSIBLE SKIN GRAFT;  Surgeon: Nadara Mustard, MD;  Location: [facility] OR;  Service: Orthopedics;  Laterality: Right;  . JOINT REPLACEMENT     Social History   Occupational History  . Not on file  Tobacco Use  . Smoking status: Never Smoker  . Smokeless tobacco: Never Used  Substance and Sexual Activity  . Alcohol use: No  . Drug use: No  . Sexual activity: Not on file

## 2019-11-14 ENCOUNTER — Ambulatory Visit (HOSPITAL_COMMUNITY): Admission: RE | Admit: 2019-11-14 | Payer: Medicare Other | Source: Home / Self Care | Admitting: Orthopedic Surgery

## 2019-11-14 ENCOUNTER — Telehealth: Payer: Self-pay | Admitting: Orthopedic Surgery

## 2019-11-14 HISTORY — DX: Atherosclerotic heart disease of native coronary artery without angina pectoris: I25.10

## 2019-11-14 HISTORY — DX: Headache, unspecified: R51.9

## 2019-11-14 HISTORY — DX: Peripheral vascular disease, unspecified: I73.9

## 2019-11-14 SURGERY — IRRIGATION AND DEBRIDEMENT EXTREMITY
Anesthesia: Choice | Laterality: Right

## 2019-11-14 NOTE — Telephone Encounter (Signed)
Santina Evans from [facility] called.   She needs new verbal orders for wound care on the patient.   Call back: [phone]

## 2019-11-15 NOTE — Telephone Encounter (Signed)
Called and sw HHN to advise as of last office visit dry dressing application.

## 2019-11-20 NOTE — Telephone Encounter (Signed)
E 

## 2019-11-21 ENCOUNTER — Ambulatory Visit: Payer: Medicare Other | Admitting: Family

## 2019-11-28 ENCOUNTER — Telehealth: Payer: Self-pay | Admitting: Orthopedic Surgery

## 2019-11-28 NOTE — Telephone Encounter (Signed)
Abdisia with [facility] called stating the labs the pt had on 09/03/19 has a code that insurance will not cover so she would like to see if we could change it?  CB# [phone] Fax# [phone] attention Augustina Mood

## 2019-11-28 NOTE — Telephone Encounter (Signed)
I called and orders were signed by Dr. Lajoyce Corners for labs for CBC, Cmet and Vanc trough while ot was receiving PICC line ABX. Advised could use codes L97.313 ankle ulcer with necrosis of muscle and Z94.5 h/o skin graft. To call with any questions or if needs further assistance.

## 2019-11-29 ENCOUNTER — Telehealth: Payer: Self-pay | Admitting: Orthopedic Surgery

## 2019-11-29 NOTE — Telephone Encounter (Signed)
Darra Lis from [facility] called.   The patient was referred to another provider to get a wound on her leg treated. Because she has already seen Dr.Duda, the case manager wanted to know if she could be switched into his care for the issue.   Call back: [phone]

## 2019-11-30 NOTE — Telephone Encounter (Signed)
Called and lm on vm to advise that Dr. Lajoyce Corners would be happy to see pt for both issues. Pt has an appt on 12/13/19 and if this needs to be moved up to have the pt call the office and we can adjust this for her. To call with any questions.

## 2019-12-10 ENCOUNTER — Ambulatory Visit: Payer: Self-pay

## 2019-12-10 ENCOUNTER — Ambulatory Visit (INDEPENDENT_AMBULATORY_CARE_PROVIDER_SITE_OTHER): Payer: Medicare Other | Admitting: Orthopedic Surgery

## 2019-12-10 ENCOUNTER — Encounter: Payer: Self-pay | Admitting: Orthopedic Surgery

## 2019-12-10 VITALS — Ht 64.0 in | Wt 180.0 lb

## 2019-12-10 DIAGNOSIS — M79671 Pain in right foot: Secondary | ICD-10-CM | POA: Diagnosis not present

## 2019-12-10 DIAGNOSIS — L97919 Non-pressure chronic ulcer of unspecified part of right lower leg with unspecified severity: Secondary | ICD-10-CM

## 2019-12-10 DIAGNOSIS — I87333 Chronic venous hypertension (idiopathic) with ulcer and inflammation of bilateral lower extremity: Secondary | ICD-10-CM

## 2019-12-10 DIAGNOSIS — I96 Gangrene, not elsewhere classified: Secondary | ICD-10-CM

## 2019-12-10 DIAGNOSIS — L97313 Non-pressure chronic ulcer of right ankle with necrosis of muscle: Secondary | ICD-10-CM

## 2019-12-10 DIAGNOSIS — L97929 Non-pressure chronic ulcer of unspecified part of left lower leg with unspecified severity: Secondary | ICD-10-CM

## 2019-12-10 NOTE — Progress Notes (Signed)
Office Visit Note   Patient: Kaitlin Hudson           Date of Birth: [birth year]           MRN: [id number] Visit Date: 12/10/2019              Requested by: No referring provider defined for this encounter. PCP: Earley Abide (Inactive)  Chief Complaint  Patient presents with  . Right Ankle - Follow-up    Right Achilles debridement and skin graft 08/15/2019  . Left Foot - Pain      HPI: Patient is a 64 year old woman who was seen for evaluation for acute gangrenous changes to the left toes.  She is status post skin graft for necrotic ulceration over the right Achilles.  Patient states she has been hospitalized for about 10 days at [facility] for sepsis she still has her PICC line in place is not on antibiotics.  Patient did have an arteriogram for the left lower extremity and was told that she needed an amputation due to the poor microcirculation.  Patient sleeps with her legs dependent.  Assessment & Plan: Visit Diagnoses:  1. Pain in right foot   2. 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 Ankle ulcer, right, with necrosis of muscle (HCC)   4. Gangrene of toe of left foot (HCC)     Plan: With the massive swelling of both lower extremities we will plan on twice a week compression wraps with Dynaflex and Prisma over the right Achilles wound.  Discussed the patient's circulation should improve if we can decrease the massive edema.  Follow-Up Instructions: Return in about 1 week (around 12/17/2019) for Follow-up Thursday and Mondays.   Ortho Exam  Patient is alert, oriented, no adenopathy, well-dressed, normal affect, normal respiratory effort. Examination patient has increased ischemic changes to the skin graft on the right Achilles.  She has black gangrenous changes to the toes on the left foot.  There is massive pitting edema in both lower extremities with weeping edema from both legs.  There is no  cellulitis in the legs.  A Doppler was used and patient has a strong biphasic dorsalis pedis pulse on the left and a monophasic dorsalis pedis pulse on the right.  Imaging: No results found. No images are attached to the encounter.  Labs: Lab Results  Component Value Date   HGBA1C 7.2 (H) 08/15/2019   LABURIC 2.5 09/11/2013   REPTSTATUS 08/20/2019 FINAL 08/15/2019   GRAMSTAIN  08/15/2019    RARE WBC PRESENT, PREDOMINANTLY PMN ABUNDANT GRAM POSITIVE RODS FEW GRAM POSITIVE COCCI RARE GRAM VARIABLE ROD    CULT  08/15/2019    ABUNDANT CORYNEBACTERIUM STRIATUM ABUNDANT HAEMOPHILUS PARAINFLUENZAE BETA LACTAMASE NEGATIVE NO ANAEROBES ISOLATED Performed at [facility], [address], [place], [place] [address]    LABORGA ESCHERICHIA COLI (A) 11/07/2015     Lab Results  Component Value Date   ALBUMIN 3.1 (L) 07/21/2019   ALBUMIN 3.2 (L) 11/07/2015   LABURIC 2.5 09/11/2013    No results found for: MG No results found for: VD25OH  No results found for: PREALBUMIN CBC EXTENDED Latest Ref Rng & Units 08/15/2019 07/21/2019 11/07/2015  WBC 4.0 - 10.5 K/uL 10.2 13.9(H) 12.2(H)  RBC 3.87 - 5.11 MIL/uL 4.55 4.43 4.06  HGB 12.0 - 15.0 g/dL 86.5 78.4 11.7(L)  HCT 36 - 46 % 43.0 40.2 35.4(L)  PLT 150 - 400 K/uL 238  233 250  NEUTROABS 1.7 - 7.7 K/uL - 11.7(H) 8.2(H)  LYMPHSABS 0.7 - 4.0 K/uL - 0.7 2.3     Body mass index is 30.9 kg/m.  Orders:  No orders of the defined types were placed in this encounter.  No orders of the defined types were placed in this encounter.    Procedures: No procedures performed  Clinical Data: No additional findings.  ROS:  All other systems negative, except as noted in the HPI. Review of Systems  Objective: Vital Signs: Ht 5\' 4"  (1.626 m)   Wt 180 lb (81.6 kg)   BMI 30.90 kg/m   Specialty Comments:  No specialty comments available.  PMFS History: Patient Active Problem List   Diagnosis Date Noted  . Heel ulcer, right, with  necrosis of muscle (HCC)   . Chronic diastolic heart failure (HCC) 09/12/2013  . Rheumatoid arthritis (HCC) 09/12/2013  . IDDM (insulin dependent diabetes mellitus) 09/09/2013  . Cellulitis of right lower extremity 09/08/2013   Past Medical History:  Diagnosis Date  . Arthritis, rheumatoid (HCC)   . CHF (congestive heart failure) (HCC)   . Coronary artery disease   . Diabetes mellitus without complication (HCC)   . Headache   . Hyperlipidemia   . Hypertension   . Latent tuberculosis   . Peripheral vascular disease (HCC)    blood clot in left leg  . Type 1 diabetes (HCC)     History reviewed. No pertinent family history.  Past Surgical History:  Procedure Laterality Date  . CARPAL TUNNEL RELEASE Bilateral   . FOOT SURGERY    . I & D EXTREMITY Right 08/15/2019   Procedure: RIGHT ACHILLES DEBRIDEMENT, POSSIBLE SKIN GRAFT;  Surgeon: 10/15/2019, MD;  Location: [facility] OR;  Service: Orthopedics;  Laterality: Right;  . JOINT REPLACEMENT     Social History   Occupational History  . Not on file  Tobacco Use  . Smoking status: Never Smoker  . Smokeless tobacco: Never Used  Substance and Sexual Activity  . Alcohol use: No  . Drug use: No  . Sexual activity: Not on file

## 2019-12-12 ENCOUNTER — Telehealth: Payer: Self-pay

## 2019-12-12 NOTE — Telephone Encounter (Signed)
Kaitlin Hudson from [facility] wants to ask questions regarding the patients pick in her arm and also about new orders from dr duda

## 2019-12-13 ENCOUNTER — Ambulatory Visit (INDEPENDENT_AMBULATORY_CARE_PROVIDER_SITE_OTHER): Payer: Medicare Other | Admitting: Orthopedic Surgery

## 2019-12-13 ENCOUNTER — Other Ambulatory Visit: Payer: Self-pay

## 2019-12-13 ENCOUNTER — Encounter: Payer: Self-pay | Admitting: Orthopedic Surgery

## 2019-12-13 VITALS — Ht 64.0 in | Wt 180.0 lb

## 2019-12-13 DIAGNOSIS — L97919 Non-pressure chronic ulcer of unspecified part of right lower leg with unspecified severity: Secondary | ICD-10-CM | POA: Diagnosis not present

## 2019-12-13 DIAGNOSIS — L97929 Non-pressure chronic ulcer of unspecified part of left lower leg with unspecified severity: Secondary | ICD-10-CM | POA: Diagnosis not present

## 2019-12-13 DIAGNOSIS — I739 Peripheral vascular disease, unspecified: Secondary | ICD-10-CM | POA: Diagnosis not present

## 2019-12-13 DIAGNOSIS — I87333 Chronic venous hypertension (idiopathic) with ulcer and inflammation of bilateral lower extremity: Secondary | ICD-10-CM | POA: Diagnosis not present

## 2019-12-13 NOTE — Telephone Encounter (Signed)
I called and lm on vm to advise that the pt is being followed by ID and that any questions about PICC line should be directed to them. 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 To call with any other questions.

## 2019-12-13 NOTE — Telephone Encounter (Signed)
PICC managed by Infectious Disease. Can u find out what they need orderwise from [place]?

## 2019-12-14 ENCOUNTER — Telehealth: Payer: Self-pay | Admitting: Orthopedic Surgery

## 2019-12-14 NOTE — Telephone Encounter (Signed)
Patient called.   She said she is on eliquis and has surgery coming up. She wanted to know how far in advance should she stop taking it.   She also wanted to let us know that she had 4 stents placed on heart in August 20th   Call back: [phone]

## 2019-12-15 ENCOUNTER — Other Ambulatory Visit (HOSPITAL_COMMUNITY)
Admission: RE | Admit: 2019-12-15 | Discharge: 2019-12-15 | Disposition: A | Discharge status: Home / Self Care | Payer: Medicare Other | Source: Ambulatory Visit | Attending: Orthopedic Surgery | Admitting: Orthopedic Surgery

## 2019-12-15 DIAGNOSIS — Z20822 Contact with and (suspected) exposure to covid-19: Secondary | ICD-10-CM | POA: Diagnosis not present

## 2019-12-15 DIAGNOSIS — Z01812 Encounter for preprocedural laboratory examination: Secondary | ICD-10-CM | POA: Insufficient documentation

## 2019-12-15 LAB — SARS CORONAVIRUS 2 (TAT 6-24 HRS): SARS Coronavirus 2: NEGATIVE

## 2019-12-17 ENCOUNTER — Encounter: Payer: Self-pay | Admitting: Orthopedic Surgery

## 2019-12-17 ENCOUNTER — Telehealth: Payer: Self-pay

## 2019-12-17 ENCOUNTER — Other Ambulatory Visit: Payer: Self-pay | Admitting: Physician Assistant

## 2019-12-17 ENCOUNTER — Encounter (HOSPITAL_COMMUNITY): Payer: Self-pay | Admitting: Orthopedic Surgery

## 2019-12-17 NOTE — Telephone Encounter (Signed)
Stop eliquis 2 days before surgery

## 2019-12-17 NOTE — Progress Notes (Signed)
Patient denies shortness of breath, fever, cough or chest pain.  PCP - Dr [name] Cardiologist - Dr Vanita Panda Endocrinology - Dr Allena Katz Rheumatology - Dr Hezzie Bump  Chest x-ray - 11/15/19 (1V) CE EKG - 07/21/19, 11/16/19 CE Stress Test - 10/2019  ECHO TEE - 09/2019 CE Cardiac Cath - 10/2019   Fasting Blood Sugar - 100-300s Checks Blood Sugar 4-6  times a day  Patient has humalog insulin pump. Reduce basil rate by 20% at midnight.    If your blood sugar is less than 70 mg/dL, you will need to treat for low blood sugar: o Treat a low blood sugar (less than 70 mg/dL) with  cup of clear juice (cranberry or apple), 4 glucose tablets, OR glucose gel. o Recheck blood sugar in 15 minutes after treatment (to make sure it is greater than 70 mg/dL). If your blood sugar is not greater than 70 mg/dL on recheck, call [phone] for further instructions.  Blood Thinner Instructions:  Stop eliquis 2 days prior to surgery per MD.  Last dose eliquis was on Saturday, 12/15/19.  Patient's last dose of plavix was on 12/17/19.  (Per Dr Lajoyce Corners via telephone verbal order 12/17/19 at 1505 to stop plavix now).  ERAS: Clears til 0815 DOS, no drink  Anesthesia review: Yes  STOP now taking any Aspirin (unless otherwise instructed by your surgeon), Aleve, Naproxen, Ibuprofen, Motrin, Advil, Goody's, BC's, all herbal medications, fish oil, and all vitamins.   Coronavirus Screening Covid test on 12/15/19 was negative.  Patient verbalized understanding of instructions that were given via phone.

## 2019-12-17 NOTE — Progress Notes (Signed)
Spoke with Diabetes Coordinator Jeannine pager [phone] to inform her of patient's Medtronic Insulin Pump with Humalog Insulin. Patient to reduce basal rate by 20% midnight.  Patient informed to bring extra supplies for her insulin pump.

## 2019-12-17 NOTE — Telephone Encounter (Signed)
I called and lm on vm to advise of message below.  

## 2019-12-17 NOTE — Telephone Encounter (Signed)
Please see below.

## 2019-12-17 NOTE — Telephone Encounter (Signed)
Kaitlin Hudson with preadmission testing lvm on triage phone stating pt needs instructions on when to stop her plavix. She has already stopped her Eliquis. Pt is scheduled for sx on Wednesday.   Pt CB # [phone]

## 2019-12-17 NOTE — Progress Notes (Signed)
Office Visit Note   Patient: Kaitlin Hudson           Date of Birth: 1955/12/21           MRN: [id number] Visit Date: 12/13/2019              Requested by: No referring provider defined for this encounter. PCP: Earley Abide (Inactive)  Chief Complaint  Patient presents with  . Right Achilles Tendon - Follow-up    08/15/19 right achilles debridement       HPI: Patient is a 64 year old woman who presents in follow-up for both lower extremities.  Patient is status post debridement and skin graft for the right Achilles ulcer and is 4 months out from surgery.  She has been using Prisma and a compression wrap she states she is unable to tolerate the dressing at this time.  Patient also complains of increasing blistering maceration drainage and necrotic changes to the toes of the left foot.  Patient also complains of cramping in her thumbs she states that recently she has started Lasix 40 mg a day.  Assessment & Plan: Visit Diagnoses:  1. 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 PVD (peripheral vascular disease) (HCC)     Plan: Recommended coconut water to try to improve her electrolyte balance most likely altered by her Lasix.  Discussed that with the progressive gangrenous changes to both lower extremities and increasing pain in both lower extremities recommended proceeding with bilateral transtibial amputation.  Discussed that she would definitely need a motorized wheelchair or scooter in order to ambulate within her home.  Follow-Up Instructions: Return in about 1 week (around 12/20/2019).   Ortho Exam  Patient is alert, oriented, no adenopathy, well-dressed, normal affect, normal respiratory effort. Examination patient has necrosis and pain of all the toes of the left foot.  The right heel ulcer is now necrotic with necrotic exposed bone.  Patient has completed a course of IV antibiotics through the PICC  line  Imaging: No results found. No images are attached to the encounter.  Labs: Lab Results  Component Value Date   HGBA1C 7.2 (H) 08/15/2019   LABURIC 2.5 09/11/2013   REPTSTATUS 08/20/2019 FINAL 08/15/2019   GRAMSTAIN  08/15/2019    RARE WBC PRESENT, PREDOMINANTLY PMN ABUNDANT GRAM POSITIVE RODS FEW GRAM POSITIVE COCCI RARE GRAM VARIABLE ROD    CULT  08/15/2019    ABUNDANT CORYNEBACTERIUM STRIATUM ABUNDANT HAEMOPHILUS PARAINFLUENZAE BETA LACTAMASE NEGATIVE NO ANAEROBES ISOLATED Performed at [facility], [address], [place], [place] [address]    LABORGA ESCHERICHIA COLI (A) 11/07/2015     Lab Results  Component Value Date   ALBUMIN 3.1 (L) 07/21/2019   ALBUMIN 3.2 (L) 11/07/2015   LABURIC 2.5 09/11/2013    No results found for: MG No results found for: VD25OH  No results found for: PREALBUMIN CBC EXTENDED Latest Ref Rng & Units 08/15/2019 07/21/2019 11/07/2015  WBC 4.0 - 10.5 K/uL 10.2 13.9(H) 12.2(H)  RBC 3.87 - 5.11 MIL/uL 4.55 4.43 4.06  HGB 12.0 - 15.0 g/dL 74.2 59.5 11.7(L)  HCT 36 - 46 % 43.0 40.2 35.4(L)  PLT 150 - 400 K/uL 238 233 250  NEUTROABS 1.7 - 7.7 K/uL - 11.7(H) 8.2(H)  LYMPHSABS 0.7 - 4.0 K/uL - 0.7 2.3     Body mass index is 30.9 kg/m.  Orders:  No orders of the defined types were placed in this encounter.  No  orders of the defined types were placed in this encounter.    Procedures: No procedures performed  Clinical Data: No additional findings.  ROS:  All other systems negative, except as noted in the HPI. Review of Systems  Objective: Vital Signs: Ht 5\' 4"  (1.626 m)   Wt 180 lb (81.6 kg)   BMI 30.90 kg/m   Specialty Comments:  No specialty comments available.  PMFS History: Patient Active Problem List   Diagnosis Date Noted  . Heel ulcer, right, with necrosis of muscle (HCC)   . Chronic diastolic heart failure (HCC) 09/12/2013  . Rheumatoid arthritis (HCC) 09/12/2013  . IDDM (insulin dependent diabetes  mellitus) 09/09/2013  . Cellulitis of right lower extremity 09/08/2013   Past Medical History:  Diagnosis Date  . Arthritis, rheumatoid (HCC)   . CHF (congestive heart failure) (HCC)   . Coronary artery disease   . Diabetes mellitus without complication (HCC)   . Headache   . Hyperlipidemia   . Hypertension   . Latent tuberculosis   . Peripheral vascular disease (HCC)    blood clot in left leg  . Type 1 diabetes (HCC)     History reviewed. No pertinent family history.  Past Surgical History:  Procedure Laterality Date  . CARPAL TUNNEL RELEASE Bilateral   . FOOT SURGERY    . I & D EXTREMITY Right 08/15/2019   Procedure: RIGHT ACHILLES DEBRIDEMENT, POSSIBLE SKIN GRAFT;  Surgeon: 10/15/2019, MD;  Location[facility] OR;  Service: Orthopedics;  Laterality: Right;  . JOINT REPLACEMENT     Social History   Occupational History  . Not on file  Tobacco Use  . Smoking status: Never Smoker  . Smokeless tobacco: Never Used  Substance and Sexual Activity  . Alcohol use: No  . Drug use: No  . Sexual activity: Not on file

## 2019-12-18 NOTE — Progress Notes (Signed)
Anesthesia Chart Review: SAME DAY WORK-UP   Case: [id number] Date/Time: 12/19/19 1058   Procedure: BILATERAL BELOW KNEE AMPUTATION (Bilateral Knee)   Anesthesia type: Choice   Pre-op diagnosis: Gangrene Bilateral Feet   [facility]: [facility] ROOM 08 / [facility]   Surgeons: Nadara Mustard, MD      DISCUSSION: Patient is a 64 year old female scheduled for the above procedure.  Patient is a 64 year old female scheduled for the above procedure.  She is s/p right Achilles excisional debridement of skin and soft tissue, muscle, and tendon with split thickness skin graft and placement of wound VAC on 08/15/2019. In the interim, she was diagnosed with acute LLE DVT 10/19/19 (acute DVT left CFV and chronic DVT femoral and popliteal veins) and placed on Eliquis and later underwent DES x4 (total) on 11/02/19 at [facility] and is was on Eliquis, ASA, and Plavix x 1 week with instructions to then change to Eliquis and Plavix for one year. She unfortunately developed increased drainage from her right ankle wound and despite office debridement and also left foot blisters and required admission 11/15/19-11/26/19 for septic shock (sent to [facility] for admission by cardiology hypotension and concern for sepsis). Podiatry, wound care, vascular surgery, and ID consulted. She underwent arteriogram on 11/23/19 showing severe small vessel disease not amenable to intervention. She was discharged with wound care and IV antibiotics. At 12/13/19 visit with Dr. Lajoyce Corners right heel ulcer now necrotic with exposed bone and blistering maceration with necrotic changed of the left foot. Bilateral BKA recommended.   Other history includes never smoker, HTN, CHF, CAD (s/p atherectomy/Synergy DES pLCX, atherectomy/Onyx DES mid LAD extending to proximal LM, Onyx DES to mid and proximal RCA 11/02/19), DM1 (diagnosed age 55; has Medtronic 670 G insulin pump), DVT (acute DVT left CFV and chronic DVT femoral and popliteal veins 10/19/19), RA (on chronic  steroids), latent TB, headaches.   Patient last evaluated by cardiologist Dr. Vanita Panda on 12/12/19 [facility] Care Everywhere). She was doing "fairly well from a cardiovascular standpoint". She is on single antiplatelet therapy (recommendation for 12 months) with anticoagulation. HTN controlled on b-blocker, but BP still too tenuous to restart ACEi. He did start her on diuretic therapy for volume status/LE edema. He notes she may need amputation and stated, "will defer to the expertise of colleagues in vascular medicine, orthopedics and infectious diseases regarding next steps."  Per patient, instructed to hold Eliquis 2 days prior to surgery (last dose 12/15/19) and last Plavix 12/17/19. Discussed with anesthesiologist Shona Simpson, MD. Given her recent drug-eluting stents, would recommend clarifying with patient's cardiologist if she would require any type of bridging while anti-platelet/anti-coagulation on hold. This was communicated with Dr. Lajoyce Corners and his scheduler Elnita Maxwell. Elnita Maxwell was able to reach out to cardiologist Dr. Vanita Panda. She notes that he indicated  "ok for patient to stop Eliquis for surgery but continue Plavix." Elnita Maxwell spoke with Ms. Gritz on 12/18/19 and made her aware.   PAT RN notified diabetes coordinator regarding insulin pump.  12/15/2019 presurgical COVID-19 test negative.  She is on chronic prednisone 10 mg every morning and 5 mg every evening. She is a same day work-up, so labs and anesthesia team evaluation on the day of surgery.     VS: Ht 5\' 4"  (1.626 m)    Wt 83.9 kg    BMI 31.76 kg/m   Blood Pressure 120/60 12/12/2019 1:44 PM EDT  Pulse 97 12/12/2019 1:44 PM EDT  Oxygen Saturation 99% 12/12/2019 1:44 PM  EDT  Height 162.6 cm (5\' 4" ) 12/12/2019 1:44 PM EDT     PROVIDERS: [name], MD is PCP [facility] Care Everywhere). Last visit 11/29/19 with Lynett Fish, PA-C for hospital follow-up. Hezzie Bump, MD is rheumatologist [facility] Care Everywhere). Last  visit 12/03/19. Prednisone continued, but remaining off other immunosuppression given "untreatable latent TB."  - Sedalia Muta, MD is cardiologist [facility], see Care Everywhere). Last visit 12/12/19.  66-month follow-up planned. Izell Racine, MD is endocrinologist (Care Everywhere). Last visit 09/28/19. Next visit 01/07/20.  [facility] [place]. See non 10/19/19 by Yolande Jolly, PA-C for chronic venous hypertension and acute LLE DVT and was started on Eliquis. Last visit 10/26/19 with next visit 12/14/19.  Adolm Joseph, MD is Wound Care specialist [facility], see Care Everywhere). Last visit 10/03/19.  Madelin Headings, MD is ID [facility] Care Everywhere). Lat visit 12/05/19.    LABS: Currently, most recent labs results from 11/29/19 (see [facility] Care Everywhere) include CBC and CMET: Na 140, K 4.8, BUN 19, Cr 1.04, glucose 85, AST 18, ALT 14,  WBC 10.1, H/H 8.4/25.1, PLT 363. HGB range 7.5-9.5 since 11/21/19, mostly ~ 8.0. A1c 7.2% on 11/02/19.   IMAGES: CT Head 10/19/19 [facility]): IMPRESSION:  - No CT evidence of acute intracranial abnormality.  - Nonspecific 6 mm somewhat rounded dural-based calcification along  the anterior left frontal convexity for which an incidental tiny  calcified meningioma is difficult to exclude.   CXR 11/15/19 [facility] CE): FINDINGS:  The heart size and mediastinal contours are within normal limits.  Both lungs are clear. The visualized skeletal structures are  unremarkable.  IMPRESSION:  No active disease.     EKG: EKG 11/16/19 [facility]): Per Result Narrative: Ventricular Rate          107    BPM          Atrial Rate            107    BPM          P-R Interval            166    ms           QRS Duration            60    ms          Q-T Interval            302    ms           QTC                 403    ms           P Axis               42    degrees        R Axis               -17    degrees        T Axis               24    degrees        Sinus tachycardia  Possible Left atrial enlargement  Low voltage QRS, consider pulmonary disease or obesity  Otherwise normal  When compared with [facility] 15-Nov-2019 15:18,  No significant change was found  Confirmed by Ginger Carne [phone]) on 11/16/2019 9:44:11 AM  EKG 07/21/19: Sinus tachycardia at 107 bpm  Atrial premature complex LAE, consider biatrial enlargement Low voltage, extremity and precordial leads Consider anterior infarct Baseline wander in lead(s) I aVR aVL V2 V3 V6 Confirmed by Gwyneth Sprout ([id number]) on 07/21/2019 5:13:03 PM   CV: LE Venous US 11/17/19 [facility] CE): Interpretation Summary  There is evidence of chronic/nonobstructive deep venous thrombosis  involving the left superficial femoral vein. LT Chronic DVT in the  Popliteal V. No evidence of right lower extremity deep venous  thrombosis above the knee. Bilateral PTV, ATV, and Peroneal V could  not be visualized due to numerouslimiting factors therefore no calf  DVT can be ruled out.   (Comparison 10/19/19: Acute DVT in her left lower extremity in her common femoral, she has chronic DVTs in her femoral and popliteal veins)   PCI 11/02/19 [facility] CE):  Planned PCI of LAD, LCX, RCA after CTS turndown.  Left femoral artery access.  Started with LCX.  Roto with 1.5 Ines Bloomer then IVUS guided stenting using a 2.25x28 Synergy DES  post dilated on the proximal portion with a 2.5 [place]  Next performed PCI on LAD using Roto 1.5 [place] then IVUS guided stenting  using a 2.75x34 Onyx (changed stent manufacturer for exact length)  extending from the midLAD back into the proximal LMCA. Mid portion post  dilated with a 3.0 [place] and LM post dilated with a 4.0  Next switched to RCA.   Stented mid and proximal RCA using a 3.0x38 and then a 3.5x26 Onyx DES.   Attempted to IVUS post but unable to advance IVUS catheter. Good  angiographic result.    LHC 10/29/19 [facility] CE): LM: Distal 70% severely calcified  LAD: Proximal 90% severely calcified, distal 70%, first diagonal 99% with  TIMI II flow [small vessel]  Lcx: Proximal 95% severely calcified, distal 40%  RCA: Proximal 90% severely calcified, mid 80%  - Plan:  1. Secondary prevention of coronary artery disease  2. Evaluation for CABG versus rotational atherectomy assisted PCI  Per 10/31/19 cardiology follow-up, decision made to proceed with complex multi-vessel PCI at [facility]. See PCI 11/02/19.    Echo 09/28/19 [facility] CE): SUMMARY  Mild left ventricular hypertrophy  The left ventricle is hyperdynamic.  LV ejection fraction = 65-70%.  There is trace tricuspid regurgitation.  RVSP not able to be calculated.  There is no comparison study available.  Image Quality: Technically difficult.    Past Medical History:  Diagnosis Date   Arthritis, rheumatoid (HCC)    CHF (congestive heart failure) (HCC)    pt denies this dx   Coronary artery disease    Diabetes mellitus without complication (HCC)    Headache    Hyperlipidemia    Hypertension    Latent tuberculosis    pt denies this dx   Peripheral vascular disease (HCC)    blood clot in left leg   Type 1 diabetes (HCC)     Past Surgical History:  Procedure Laterality Date   CARDIAC CATHETERIZATION  11/02/2019   CARPAL TUNNEL RELEASE Bilateral    EYE SURGERY Bilateral    09/2018   FINGER SURGERY  07/11/2014   right middle finger - nodule removed   FINGER SURGERY  02/20/2017   right middle finger   FOOT SURGERY Right 02/26/2014   screw   I & D EXTREMITY Right 08/15/2019   Procedure: RIGHT ACHILLES DEBRIDEMENT, POSSIBLE SKIN GRAFT;  Surgeon: Nadara Mustard, MD;  Location: [facility] OR;  Service: Orthopedics;  Laterality: Right;  JOINT  REPLACEMENT     SHOULDER SURGERY Right 01/06/2005   TRIGGER FINGER RELEASE  12/202002   left hand -    WRIST SURGERY Left 10/23/2004   and middle finger   WRIST SURGERY Right 11/05/2005   WRIST SURGERY Right 03/11/2006   cyst removed    MEDICATIONS: No current facility-administered medications for this encounter.    acetaminophen (TYLENOL) 500 MG tablet   apixaban (ELIQUIS) 5 MG TABS tablet   bumetanide (BUMEX) 2 MG tablet   cholecalciferol (VITAMIN D) 25 MCG (1000 UNIT) tablet   clopidogrel (PLAVIX) 75 MG tablet   HUMALOG 100 UNIT/ML injection   metoprolol tartrate (LOPRESSOR) 25 MG tablet   oxyCODONE (OXY IR/ROXICODONE) 5 MG immediate release tablet   polyethylene glycol (MIRALAX / GLYCOLAX) 17 g packet   predniSONE (DELTASONE) 5 MG tablet   bisacodyl (DULCOLAX) 10 MG suppository   docusate sodium (COLACE) 100 MG capsule   glucose blood (ONETOUCH ULTRA) test strip   Insulin Human (INSULIN PUMP) SOLN   Insulin Syringe-Needle U-100 (INSULIN SYRINGE .3CC/31GX5/16") 31G X 5/16" 0.3 ML MISC    Shonna Chock, PA-C Surgical Short Stay/Anesthesiology [facility] Phone [phone] [facility] Phone [phone] 12/18/2019 3:38 PM

## 2019-12-18 NOTE — Anesthesia Preprocedure Evaluation (Addendum)
Anesthesia Evaluation  Patient identified by MRN, date of birth, ID band Patient awake    Reviewed: Allergy & Precautions, NPO status , Patient's Chart, lab work & pertinent test results  Airway Mallampati: II  TM Distance: >3 FB Neck ROM: Full    Dental no notable dental hx. (+) Teeth Intact, Dental Advisory Given   Pulmonary neg pulmonary ROS,  12/15/2019 SARS coronavirus NEG   Pulmonary exam normal breath sounds clear to auscultation       Cardiovascular hypertension, Pt. on medications and Pt. on home beta blockers + CAD and + Peripheral Vascular Disease  Normal cardiovascular exam Rhythm:Regular Rate:Normal  LHC 10/29/19 [facility] CE): LM: Distal 70% severely calcified  LAD: Proximal 90% severely calcified, distal 70%, first diagonal 99% with  TIMI II flow (small vessel)  Lcx: Proximal 95% severely calcified, distal 40%  RCA: Proximal 90% severely calcified, mid 80%  - By notes, turned down by CT surgery for consideration of CABG. S/p Complex PCI 11/02/19: DES to CX, LAD, RCA   Echo 09/28/19 [facility] CE): SUMMARY  Mild left ventricular hypertrophy  The left ventricle is hyperdynamic.  LV ejection fraction = 65-70%.  There is trace tricuspid regurgitation.  RVSP not able to be calculated.  There is no comparison study available.  Image Quality: Technically difficult.     Neuro/Psych  Headaches, negative psych ROS   GI/Hepatic negative GI ROS, Neg liver ROS,   Endo/Other  diabetes, Insulin Dependent  Renal/GU negative Renal ROS  negative genitourinary   Musculoskeletal  (+) Arthritis , Rheumatoid disorders and steroids,    Abdominal (+) + obese,   Peds negative pediatric ROS (+)  Hematology negative hematology ROS (+) eliquis   Anesthesia Other Findings   Reproductive/Obstetrics negative OB ROS                           Anesthesia Physical Anesthesia Plan  ASA: IV  Anesthesia Plan:  General   Post-op Pain Management:    Induction: Intravenous  PONV Risk Score and Plan: 4 or greater and Dexamethasone, Ondansetron and Treatment may vary due to age or medical condition  Airway Management Planned: LMA  Additional Equipment:   Intra-op Plan:   Post-operative Plan: Extubation in OR  Informed Consent:   Plan Discussed with: Anesthesiologist and CRNA  Anesthesia Plan Comments: (See PAT note written 12/18/2019 by Shonna Chock, PA-C. History CAD s/p DES CX, LAD, RCA 11/02/19,DM1 (Medtronic 670 G insulin pump), LLE DVT (10/19/19), RA (on chronic steroids). [facility] Cardiologist is Dr. [place] Calix: permission to hold Eliquis for surgery but continue Plavix.  )      Anesthesia Quick Evaluation

## 2019-12-19 ENCOUNTER — Inpatient Hospital Stay (HOSPITAL_COMMUNITY)
Admission: RE | Admit: 2019-12-19 | Discharge: 2019-12-23 | DRG: 240 | Disposition: A | Discharge status: Rehabilitation Facility | Payer: Medicare Other | Attending: Orthopedic Surgery | Admitting: Orthopedic Surgery

## 2019-12-19 ENCOUNTER — Inpatient Hospital Stay (HOSPITAL_COMMUNITY): Payer: Medicare Other | Admitting: Vascular Surgery

## 2019-12-19 ENCOUNTER — Encounter (HOSPITAL_COMMUNITY): Payer: Self-pay | Admitting: Orthopedic Surgery

## 2019-12-19 ENCOUNTER — Other Ambulatory Visit: Payer: Self-pay

## 2019-12-19 ENCOUNTER — Encounter (HOSPITAL_COMMUNITY)
Admission: RE | Disposition: A | Discharge status: Rehabilitation Facility | Payer: Self-pay | Source: Home / Self Care | Attending: Orthopedic Surgery

## 2019-12-19 DIAGNOSIS — I70223 Atherosclerosis of native arteries of extremities with rest pain, bilateral legs: Secondary | ICD-10-CM | POA: Diagnosis present

## 2019-12-19 DIAGNOSIS — L97819 Non-pressure chronic ulcer of other part of right lower leg with unspecified severity: Secondary | ICD-10-CM | POA: Diagnosis present

## 2019-12-19 DIAGNOSIS — R3915 Urgency of urination: Secondary | ICD-10-CM | POA: Diagnosis present

## 2019-12-19 DIAGNOSIS — Z887 Allergy status to serum and vaccine status: Secondary | ICD-10-CM | POA: Diagnosis not present

## 2019-12-19 DIAGNOSIS — I2510 Atherosclerotic heart disease of native coronary artery without angina pectoris: Secondary | ICD-10-CM | POA: Diagnosis present

## 2019-12-19 DIAGNOSIS — I87333 Chronic venous hypertension (idiopathic) with ulcer and inflammation of bilateral lower extremity: Secondary | ICD-10-CM | POA: Diagnosis present

## 2019-12-19 DIAGNOSIS — Z7902 Long term (current) use of antithrombotics/antiplatelets: Secondary | ICD-10-CM | POA: Diagnosis not present

## 2019-12-19 DIAGNOSIS — E1052 Type 1 diabetes mellitus with diabetic peripheral angiopathy with gangrene: Secondary | ICD-10-CM

## 2019-12-19 DIAGNOSIS — E785 Hyperlipidemia, unspecified: Secondary | ICD-10-CM | POA: Diagnosis present

## 2019-12-19 DIAGNOSIS — G8918 Other acute postprocedural pain: Secondary | ICD-10-CM | POA: Diagnosis not present

## 2019-12-19 DIAGNOSIS — I70263 Atherosclerosis of native arteries of extremities with gangrene, bilateral legs: Principal | ICD-10-CM | POA: Diagnosis present

## 2019-12-19 DIAGNOSIS — S88119A Complete traumatic amputation at level between knee and ankle, unspecified lower leg, initial encounter: Secondary | ICD-10-CM | POA: Diagnosis present

## 2019-12-19 DIAGNOSIS — L97829 Non-pressure chronic ulcer of other part of left lower leg with unspecified severity: Secondary | ICD-10-CM | POA: Diagnosis present

## 2019-12-19 DIAGNOSIS — Z881 Allergy status to other antibiotic agents status: Secondary | ICD-10-CM | POA: Diagnosis not present

## 2019-12-19 DIAGNOSIS — D62 Acute posthemorrhagic anemia: Secondary | ICD-10-CM | POA: Diagnosis present

## 2019-12-19 DIAGNOSIS — E1022 Type 1 diabetes mellitus with diabetic chronic kidney disease: Secondary | ICD-10-CM | POA: Diagnosis present

## 2019-12-19 DIAGNOSIS — E1065 Type 1 diabetes mellitus with hyperglycemia: Secondary | ICD-10-CM | POA: Diagnosis not present

## 2019-12-19 DIAGNOSIS — I2583 Coronary atherosclerosis due to lipid rich plaque: Secondary | ICD-10-CM

## 2019-12-19 DIAGNOSIS — Z7901 Long term (current) use of anticoagulants: Secondary | ICD-10-CM | POA: Diagnosis not present

## 2019-12-19 DIAGNOSIS — I739 Peripheral vascular disease, unspecified: Secondary | ICD-10-CM | POA: Diagnosis not present

## 2019-12-19 DIAGNOSIS — T402X5A Adverse effect of other opioids, initial encounter: Secondary | ICD-10-CM | POA: Diagnosis not present

## 2019-12-19 DIAGNOSIS — I130 Hypertensive heart and chronic kidney disease with heart failure and stage 1 through stage 4 chronic kidney disease, or unspecified chronic kidney disease: Secondary | ICD-10-CM | POA: Diagnosis present

## 2019-12-19 DIAGNOSIS — I10 Essential (primary) hypertension: Secondary | ICD-10-CM | POA: Diagnosis not present

## 2019-12-19 DIAGNOSIS — Z888 Allergy status to other drugs, medicaments and biological substances status: Secondary | ICD-10-CM | POA: Diagnosis not present

## 2019-12-19 DIAGNOSIS — Z79899 Other long term (current) drug therapy: Secondary | ICD-10-CM

## 2019-12-19 DIAGNOSIS — Z4781 Encounter for orthopedic aftercare following surgical amputation: Secondary | ICD-10-CM | POA: Diagnosis present

## 2019-12-19 DIAGNOSIS — Z91040 Latex allergy status: Secondary | ICD-10-CM | POA: Diagnosis not present

## 2019-12-19 DIAGNOSIS — T380X5A Adverse effect of glucocorticoids and synthetic analogues, initial encounter: Secondary | ICD-10-CM | POA: Diagnosis not present

## 2019-12-19 DIAGNOSIS — I5032 Chronic diastolic (congestive) heart failure: Secondary | ICD-10-CM | POA: Diagnosis present

## 2019-12-19 DIAGNOSIS — Z7952 Long term (current) use of systemic steroids: Secondary | ICD-10-CM | POA: Diagnosis not present

## 2019-12-19 DIAGNOSIS — Z89511 Acquired absence of right leg below knee: Secondary | ICD-10-CM | POA: Diagnosis not present

## 2019-12-19 DIAGNOSIS — N1832 Chronic kidney disease, stage 3b: Secondary | ICD-10-CM | POA: Diagnosis present

## 2019-12-19 DIAGNOSIS — Z882 Allergy status to sulfonamides status: Secondary | ICD-10-CM | POA: Diagnosis not present

## 2019-12-19 DIAGNOSIS — R000 Tachycardia, unspecified: Secondary | ICD-10-CM | POA: Diagnosis not present

## 2019-12-19 DIAGNOSIS — K5900 Constipation, unspecified: Secondary | ICD-10-CM | POA: Diagnosis not present

## 2019-12-19 DIAGNOSIS — Z886 Allergy status to analgesic agent status: Secondary | ICD-10-CM | POA: Diagnosis not present

## 2019-12-19 DIAGNOSIS — E1169 Type 2 diabetes mellitus with other specified complication: Secondary | ICD-10-CM | POA: Diagnosis not present

## 2019-12-19 DIAGNOSIS — Z9641 Presence of insulin pump (external) (internal): Secondary | ICD-10-CM | POA: Diagnosis present

## 2019-12-19 DIAGNOSIS — R519 Headache, unspecified: Secondary | ICD-10-CM | POA: Diagnosis present

## 2019-12-19 DIAGNOSIS — G546 Phantom limb syndrome with pain: Secondary | ICD-10-CM | POA: Diagnosis present

## 2019-12-19 DIAGNOSIS — I96 Gangrene, not elsewhere classified: Secondary | ICD-10-CM | POA: Diagnosis not present

## 2019-12-19 DIAGNOSIS — E1042 Type 1 diabetes mellitus with diabetic polyneuropathy: Secondary | ICD-10-CM | POA: Diagnosis present

## 2019-12-19 DIAGNOSIS — Z20822 Contact with and (suspected) exposure to covid-19: Secondary | ICD-10-CM | POA: Diagnosis present

## 2019-12-19 DIAGNOSIS — M069 Rheumatoid arthritis, unspecified: Secondary | ICD-10-CM | POA: Diagnosis present

## 2019-12-19 DIAGNOSIS — L299 Pruritus, unspecified: Secondary | ICD-10-CM | POA: Diagnosis not present

## 2019-12-19 DIAGNOSIS — R739 Hyperglycemia, unspecified: Secondary | ICD-10-CM | POA: Diagnosis not present

## 2019-12-19 DIAGNOSIS — Z6831 Body mass index (BMI) 31.0-31.9, adult: Secondary | ICD-10-CM

## 2019-12-19 DIAGNOSIS — Z86718 Personal history of other venous thrombosis and embolism: Secondary | ICD-10-CM

## 2019-12-19 DIAGNOSIS — Y92239 Unspecified place in hospital as the place of occurrence of the external cause: Secondary | ICD-10-CM | POA: Diagnosis not present

## 2019-12-19 DIAGNOSIS — Z89512 Acquired absence of left leg below knee: Secondary | ICD-10-CM | POA: Diagnosis not present

## 2019-12-19 DIAGNOSIS — I959 Hypotension, unspecified: Secondary | ICD-10-CM | POA: Diagnosis present

## 2019-12-19 DIAGNOSIS — E669 Obesity, unspecified: Secondary | ICD-10-CM | POA: Diagnosis not present

## 2019-12-19 DIAGNOSIS — Z880 Allergy status to penicillin: Secondary | ICD-10-CM

## 2019-12-19 DIAGNOSIS — R152 Fecal urgency: Secondary | ICD-10-CM | POA: Diagnosis present

## 2019-12-19 DIAGNOSIS — E875 Hyperkalemia: Secondary | ICD-10-CM | POA: Diagnosis not present

## 2019-12-19 DIAGNOSIS — Z794 Long term (current) use of insulin: Secondary | ICD-10-CM | POA: Diagnosis not present

## 2019-12-19 HISTORY — PX: AMPUTATION: SHX166

## 2019-12-19 LAB — BASIC METABOLIC PANEL
Anion gap: 16 — ABNORMAL HIGH (ref 5–15)
BUN: 30 mg/dL — ABNORMAL HIGH (ref 8–23)
CO2: 20 mmol/L — ABNORMAL LOW (ref 22–32)
Calcium: 9.2 mg/dL (ref 8.9–10.3)
Chloride: 104 mmol/L (ref 98–111)
Creatinine, Ser: 1.37 mg/dL — ABNORMAL HIGH (ref 0.44–1.00)
GFR calc non Af Amer: 40 mL/min — ABNORMAL LOW (ref >60)
Glucose, Bld: 275 mg/dL — ABNORMAL HIGH (ref 70–99)
Potassium: 4.1 mmol/L (ref 3.5–5.1)
Sodium: 140 mmol/L (ref 135–145)

## 2019-12-19 LAB — CBC
HCT: 33.5 % — ABNORMAL LOW (ref 36.0–46.0)
Hemoglobin: 10.2 g/dL — ABNORMAL LOW (ref 12.0–15.0)
MCH: 28.3 pg (ref 26.0–34.0)
MCHC: 30.4 g/dL (ref 30.0–36.0)
MCV: 92.8 fL (ref 80.0–100.0)
Platelets: 365 10*3/uL (ref 150–400)
RBC: 3.61 MIL/uL — ABNORMAL LOW (ref 3.87–5.11)
RDW: 15.1 % (ref 11.5–15.5)
WBC: 8.6 10*3/uL (ref 4.0–10.5)
nRBC: 0 % (ref 0.0–0.2)

## 2019-12-19 LAB — GLUCOSE, CAPILLARY
Glucose-Capillary: 268 mg/dL — ABNORMAL HIGH (ref 70–99)
Glucose-Capillary: 170 mg/dL — ABNORMAL HIGH (ref 70–99)
Glucose-Capillary: 130 mg/dL — ABNORMAL HIGH (ref 70–99)
Glucose-Capillary: 125 mg/dL — ABNORMAL HIGH (ref 70–99)

## 2019-12-19 LAB — HEMOGLOBIN A1C
Hgb A1c MFr Bld: 6.3 % — ABNORMAL HIGH (ref 4.8–5.6)
Mean Plasma Glucose: 134.11 mg/dL

## 2019-12-19 SURGERY — AMPUTATION BELOW KNEE
Anesthesia: General | Site: Knee | Laterality: Bilateral

## 2019-12-19 MED ORDER — BUMETANIDE 2 MG PO TABS
2.0000 mg | ORAL_TABLET | Freq: Every day | ORAL | Status: DC
  Administered 2019-12-20 – 2019-12-22 (×2): 2 mg via ORAL
  Filled 2019-12-19 (×6): qty 1

## 2019-12-19 MED ORDER — FENTANYL CITRATE (PF) 250 MCG/5ML IJ SOLN
INTRAMUSCULAR | Status: AC
  Filled 2019-12-19: qty 5

## 2019-12-19 MED ORDER — FENTANYL CITRATE (PF) 100 MCG/2ML IJ SOLN
INTRAMUSCULAR | Status: AC
  Administered 2019-12-19: 50 ug via INTRAVENOUS
  Filled 2019-12-19: qty 2

## 2019-12-19 MED ORDER — OXYCODONE HCL 5 MG PO TABS
5.0000 mg | ORAL_TABLET | ORAL | Status: DC | PRN
  Administered 2019-12-19: 5 mg via ORAL
  Administered 2019-12-20 – 2019-12-22 (×8): 10 mg via ORAL
  Administered 2019-12-22: 5 mg via ORAL
  Filled 2019-12-19 (×6): qty 2
  Filled 2019-12-19: qty 1
  Filled 2019-12-19 (×2): qty 2
  Filled 2019-12-19: qty 1
  Filled 2019-12-19 (×2): qty 2

## 2019-12-19 MED ORDER — OXYCODONE HCL 5 MG PO TABS
5.0000 mg | ORAL_TABLET | Freq: Once | ORAL | Status: AC | PRN
  Administered 2019-12-19: 5 mg via ORAL

## 2019-12-19 MED ORDER — EPHEDRINE 5 MG/ML INJ
INTRAVENOUS | Status: AC
  Filled 2019-12-19: qty 10

## 2019-12-19 MED ORDER — PHENYLEPHRINE 40 MCG/ML (10ML) SYRINGE FOR IV PUSH (FOR BLOOD PRESSURE SUPPORT)
PREFILLED_SYRINGE | INTRAVENOUS | Status: AC
  Filled 2019-12-19: qty 10

## 2019-12-19 MED ORDER — ONDANSETRON HCL 4 MG PO TABS: 4.0000 mg | ORAL_TABLET | Freq: Four times a day (QID) | ORAL | Status: DC | PRN

## 2019-12-19 MED ORDER — ACETAMINOPHEN 325 MG PO TABS: 325.0000 mg | ORAL_TABLET | ORAL | Status: DC | PRN

## 2019-12-19 MED ORDER — ACETAMINOPHEN 160 MG/5ML PO SOLN: 325.0000 mg | ORAL | Status: DC | PRN

## 2019-12-19 MED ORDER — INSULIN PUMP: SUBCUTANEOUS | Status: DC

## 2019-12-19 MED ORDER — MIDAZOLAM HCL 5 MG/5ML IJ SOLN
INTRAMUSCULAR | Status: DC | PRN
  Administered 2019-12-19: 2 mg via INTRAVENOUS

## 2019-12-19 MED ORDER — LIDOCAINE 2% (20 MG/ML) 5 ML SYRINGE
INTRAMUSCULAR | Status: AC
  Filled 2019-12-19: qty 5

## 2019-12-19 MED ORDER — DEXAMETHASONE SODIUM PHOSPHATE 10 MG/ML IJ SOLN
INTRAMUSCULAR | Status: AC
  Filled 2019-12-19: qty 1

## 2019-12-19 MED ORDER — MIDAZOLAM HCL 2 MG/2ML IJ SOLN
INTRAMUSCULAR | Status: AC
  Filled 2019-12-19: qty 2

## 2019-12-19 MED ORDER — DOCUSATE SODIUM 100 MG PO CAPS
100.0000 mg | ORAL_CAPSULE | Freq: Two times a day (BID) | ORAL | Status: DC
  Administered 2019-12-19 – 2019-12-23 (×8): 100 mg via ORAL
  Filled 2019-12-19 (×9): qty 1

## 2019-12-19 MED ORDER — CLOPIDOGREL BISULFATE 75 MG PO TABS
75.0000 mg | ORAL_TABLET | Freq: Every day | ORAL | Status: DC
  Administered 2019-12-19 – 2019-12-23 (×5): 75 mg via ORAL
  Filled 2019-12-19 (×5): qty 1

## 2019-12-19 MED ORDER — INSULIN ASPART 100 UNIT/ML ~~LOC~~ SOLN: 0.0000 [IU] | Freq: Three times a day (TID) | SUBCUTANEOUS | Status: DC

## 2019-12-19 MED ORDER — METOCLOPRAMIDE HCL 5 MG/ML IJ SOLN: 5.0000 mg | Freq: Three times a day (TID) | INTRAMUSCULAR | Status: DC | PRN

## 2019-12-19 MED ORDER — INSULIN GLARGINE 100 UNIT/ML ~~LOC~~ SOLN
27.0000 [IU] | Freq: Every day | SUBCUTANEOUS | Status: DC
  Administered 2019-12-19 – 2019-12-20 (×2): 27 [IU] via SUBCUTANEOUS
  Filled 2019-12-19 (×2): qty 0.27

## 2019-12-19 MED ORDER — CLINDAMYCIN PHOSPHATE 600 MG/50ML IV SOLN
600.0000 mg | Freq: Four times a day (QID) | INTRAVENOUS | Status: AC
  Administered 2019-12-19 – 2019-12-20 (×3): 600 mg via INTRAVENOUS
  Filled 2019-12-19 (×3): qty 50

## 2019-12-19 MED ORDER — INSULIN ASPART 100 UNIT/ML ~~LOC~~ SOLN
0.0000 [IU] | SUBCUTANEOUS | Status: DC
  Administered 2019-12-19 (×2): 1 [IU] via SUBCUTANEOUS
  Administered 2019-12-20: 2 [IU] via SUBCUTANEOUS
  Administered 2019-12-20: 3 [IU] via SUBCUTANEOUS
  Administered 2019-12-20: 2 [IU] via SUBCUTANEOUS
  Administered 2019-12-20: 3 [IU] via SUBCUTANEOUS
  Administered 2019-12-20: 5 [IU] via SUBCUTANEOUS
  Administered 2019-12-20 – 2019-12-21 (×2): 2 [IU] via SUBCUTANEOUS
  Administered 2019-12-21: 1 [IU] via SUBCUTANEOUS
  Filled 2019-12-19: qty 0.09

## 2019-12-19 MED ORDER — CLINDAMYCIN PHOSPHATE 900 MG/50ML IV SOLN
900.0000 mg | INTRAVENOUS | Status: AC
  Administered 2019-12-19: 900 mg via INTRAVENOUS
  Filled 2019-12-19: qty 50

## 2019-12-19 MED ORDER — PROPOFOL 10 MG/ML IV BOLUS
INTRAVENOUS | Status: DC | PRN
  Administered 2019-12-19: 30 mg via INTRAVENOUS
  Administered 2019-12-19: 120 mg via INTRAVENOUS

## 2019-12-19 MED ORDER — SODIUM CHLORIDE 0.9 % IV SOLN: INTRAVENOUS | Status: DC

## 2019-12-19 MED ORDER — FENTANYL CITRATE (PF) 100 MCG/2ML IJ SOLN
25.0000 ug | INTRAMUSCULAR | Status: DC | PRN
  Administered 2019-12-19: 50 ug via INTRAVENOUS

## 2019-12-19 MED ORDER — SUCCINYLCHOLINE CHLORIDE 200 MG/10ML IV SOSY
PREFILLED_SYRINGE | INTRAVENOUS | Status: AC
  Filled 2019-12-19: qty 10

## 2019-12-19 MED ORDER — ONDANSETRON HCL 4 MG/2ML IJ SOLN
INTRAMUSCULAR | Status: DC | PRN
  Administered 2019-12-19: 4 mg via INTRAVENOUS

## 2019-12-19 MED ORDER — HYDROMORPHONE HCL 1 MG/ML IJ SOLN
0.2500 mg | INTRAMUSCULAR | Status: DC | PRN
  Administered 2019-12-19: 0.5 mg via INTRAVENOUS
  Administered 2019-12-19 (×2): 0.25 mg via INTRAVENOUS

## 2019-12-19 MED ORDER — DEXAMETHASONE SODIUM PHOSPHATE 10 MG/ML IJ SOLN
INTRAMUSCULAR | Status: DC | PRN
  Administered 2019-12-19: 4 mg via INTRAVENOUS

## 2019-12-19 MED ORDER — PREDNISONE 5 MG PO TABS: 5.0000 mg | ORAL_TABLET | ORAL | Status: DC

## 2019-12-19 MED ORDER — PROPOFOL 10 MG/ML IV BOLUS
INTRAVENOUS | Status: AC
  Filled 2019-12-19: qty 20

## 2019-12-19 MED ORDER — PREDNISONE 10 MG PO TABS
10.0000 mg | ORAL_TABLET | Freq: Every day | ORAL | Status: DC
  Administered 2019-12-20 – 2019-12-23 (×4): 10 mg via ORAL
  Filled 2019-12-19 (×4): qty 1

## 2019-12-19 MED ORDER — ONDANSETRON HCL 4 MG/2ML IJ SOLN
INTRAMUSCULAR | Status: AC
  Filled 2019-12-19: qty 2

## 2019-12-19 MED ORDER — ACETAMINOPHEN 325 MG PO TABS: 325.0000 mg | ORAL_TABLET | Freq: Four times a day (QID) | ORAL | Status: DC | PRN

## 2019-12-19 MED ORDER — METOCLOPRAMIDE HCL 5 MG PO TABS: 5.0000 mg | ORAL_TABLET | Freq: Three times a day (TID) | ORAL | Status: DC | PRN

## 2019-12-19 MED ORDER — ACETAMINOPHEN 500 MG PO TABS
1000.0000 mg | ORAL_TABLET | Freq: Once | ORAL | Status: AC
  Administered 2019-12-19: 1000 mg via ORAL
  Filled 2019-12-19: qty 2

## 2019-12-19 MED ORDER — ONDANSETRON HCL 4 MG/2ML IJ SOLN: 4.0000 mg | Freq: Four times a day (QID) | INTRAMUSCULAR | Status: DC | PRN

## 2019-12-19 MED ORDER — ONDANSETRON HCL 4 MG/2ML IJ SOLN: 4.0000 mg | Freq: Once | INTRAMUSCULAR | Status: DC | PRN

## 2019-12-19 MED ORDER — DEXMEDETOMIDINE HCL IN NACL 80 MCG/20ML IV SOLN
INTRAVENOUS | Status: AC
  Filled 2019-12-19: qty 20

## 2019-12-19 MED ORDER — LIDOCAINE 2% (20 MG/ML) 5 ML SYRINGE
INTRAMUSCULAR | Status: DC | PRN
  Administered 2019-12-19: 100 mg via INTRAVENOUS

## 2019-12-19 MED ORDER — METOPROLOL TARTRATE 12.5 MG HALF TABLET
12.5000 mg | ORAL_TABLET | Freq: Two times a day (BID) | ORAL | Status: DC
  Administered 2019-12-20 – 2019-12-23 (×6): 12.5 mg via ORAL
  Filled 2019-12-19 (×9): qty 1

## 2019-12-19 MED ORDER — HYDROMORPHONE HCL 1 MG/ML IJ SOLN
0.5000 mg | INTRAMUSCULAR | Status: DC | PRN
  Administered 2019-12-19 – 2019-12-21 (×5): 0.5 mg via INTRAVENOUS
  Filled 2019-12-19 (×6): qty 1

## 2019-12-19 MED ORDER — HYDROMORPHONE HCL 1 MG/ML IJ SOLN
INTRAMUSCULAR | Status: AC
  Administered 2019-12-19: 0.5 mg via INTRAVENOUS
  Filled 2019-12-19: qty 1

## 2019-12-19 MED ORDER — ROCURONIUM BROMIDE 10 MG/ML (PF) SYRINGE
PREFILLED_SYRINGE | INTRAVENOUS | Status: AC
  Filled 2019-12-19: qty 10

## 2019-12-19 MED ORDER — MEPERIDINE HCL 25 MG/ML IJ SOLN: 6.2500 mg | INTRAMUSCULAR | Status: DC | PRN

## 2019-12-19 MED ORDER — LACTATED RINGERS IV SOLN: INTRAVENOUS | Status: DC

## 2019-12-19 MED ORDER — VITAMIN D 25 MCG (1000 UNIT) PO TABS
1000.0000 [IU] | ORAL_TABLET | Freq: Every day | ORAL | Status: DC
  Administered 2019-12-19 – 2019-12-23 (×5): 1000 [IU] via ORAL
  Filled 2019-12-19 (×9): qty 1

## 2019-12-19 MED ORDER — ORAL CARE MOUTH RINSE
15.0000 mL | Freq: Once | OROMUCOSAL | Status: AC
  Administered 2019-12-19: 15 mL via OROMUCOSAL

## 2019-12-19 MED ORDER — CHLORHEXIDINE GLUCONATE 0.12 % MT SOLN: 15.0000 mL | Freq: Once | OROMUCOSAL | Status: AC

## 2019-12-19 MED ORDER — OXYCODONE HCL 5 MG PO TABS
ORAL_TABLET | ORAL | Status: AC
Start: 2019-12-19 — End: 2019-12-20
  Filled 2019-12-19: qty 1

## 2019-12-19 MED ORDER — FENTANYL CITRATE (PF) 100 MCG/2ML IJ SOLN
INTRAMUSCULAR | Status: DC | PRN
Start: 2019-12-19 — End: 2019-12-19
  Administered 2019-12-19: 50 ug via INTRAVENOUS
  Administered 2019-12-19: 25 ug via INTRAVENOUS
  Administered 2019-12-19 (×2): 50 ug via INTRAVENOUS

## 2019-12-19 MED ORDER — 0.9 % SODIUM CHLORIDE (POUR BTL) OPTIME
TOPICAL | Status: DC | PRN
  Administered 2019-12-19: 1000 mL

## 2019-12-19 MED ORDER — INSULIN GLARGINE 100 UNIT/ML ~~LOC~~ SOLN
27.0000 [IU] | Freq: Once | SUBCUTANEOUS | Status: DC
  Filled 2019-12-19: qty 0.27

## 2019-12-19 MED ORDER — SCOPOLAMINE 1 MG/3DAYS TD PT72
1.0000 | MEDICATED_PATCH | TRANSDERMAL | Status: DC
  Administered 2019-12-19: 1.5 mg via TRANSDERMAL
  Filled 2019-12-19: qty 1

## 2019-12-19 MED ORDER — PREDNISONE 5 MG PO TABS
5.0000 mg | ORAL_TABLET | Freq: Every day | ORAL | Status: DC
  Administered 2019-12-19 – 2019-12-22 (×4): 5 mg via ORAL
  Filled 2019-12-19 (×4): qty 1

## 2019-12-19 MED ORDER — OXYCODONE HCL 5 MG/5ML PO SOLN: 5.0000 mg | Freq: Once | ORAL | Status: AC | PRN

## 2019-12-19 SURGICAL SUPPLY — 39 items
BLADE SAW RECIP 87.9 MT (BLADE) ×3 IMPLANT
BLADE SURG 21 STRL SS (BLADE) ×5 IMPLANT
BNDG COHESIVE 6X5 TAN STRL LF (GAUZE/BANDAGES/DRESSINGS) ×4 IMPLANT
CANISTER WOUND CARE 500ML ATS (WOUND CARE) ×5 IMPLANT
COVER SURGICAL LIGHT HANDLE (MISCELLANEOUS) ×3 IMPLANT
CUFF TOURN SGL QUICK 34 (TOURNIQUET CUFF) ×6
CUFF TRNQT CYL 34X4.125X (TOURNIQUET CUFF) ×1 IMPLANT
DRAPE BILATERAL LIMB T (DRAPES) ×2 IMPLANT
DRAPE DERMATAC (DRAPES) ×8 IMPLANT
DRAPE INCISE IOBAN 66X45 STRL (DRAPES) ×3 IMPLANT
DRAPE U-SHAPE 47X51 STRL (DRAPES) ×3 IMPLANT
DRESSING PREVENA PLUS CUSTOM (GAUZE/BANDAGES/DRESSINGS) ×1 IMPLANT
DRSG PREVENA PLUS CUSTOM (GAUZE/BANDAGES/DRESSINGS) ×6
DURAPREP 26ML APPLICATOR (WOUND CARE) ×5 IMPLANT
ELECT REM PT RETURN 9FT ADLT (ELECTROSURGICAL) ×3
ELECTRODE REM PT RTRN 9FT ADLT (ELECTROSURGICAL) ×1 IMPLANT
GLOVE BIOGEL PI IND STRL 9 (GLOVE) ×1 IMPLANT
GLOVE BIOGEL PI INDICATOR 9 (GLOVE) ×2
GLOVE SURG ORTHO 9.0 STRL STRW (GLOVE) ×1 IMPLANT
GOWN STRL REUS W/ TWL XL LVL3 (GOWN DISPOSABLE) ×2 IMPLANT
GOWN STRL REUS W/TWL XL LVL3 (GOWN DISPOSABLE) ×6
KIT BASIN OR (CUSTOM PROCEDURE TRAY) ×3 IMPLANT
KIT TURNOVER KIT B (KITS) ×3 IMPLANT
MANIFOLD NEPTUNE II (INSTRUMENTS) ×3 IMPLANT
NS IRRIG 1000ML POUR BTL (IV SOLUTION) ×3 IMPLANT
PACK ORTHO EXTREMITY (CUSTOM PROCEDURE TRAY) ×3 IMPLANT
PAD ARMBOARD 7.5X6 YLW CONV (MISCELLANEOUS) ×3 IMPLANT
PREVENA RESTOR ARTHOFORM 33X30 (CANNISTER) ×4 IMPLANT
SPONGE LAP 18X18 RF (DISPOSABLE) ×2 IMPLANT
STAPLER VISISTAT 35W (STAPLE) ×4 IMPLANT
STOCKINETTE IMPERVIOUS LG (DRAPES) ×5 IMPLANT
SUT ETHILON 2 0 PSLX (SUTURE) IMPLANT
SUT SILK 2 0 (SUTURE) ×3
SUT SILK 2-0 18XBRD TIE 12 (SUTURE) ×1 IMPLANT
SUT VIC AB 1 CTX 27 (SUTURE) ×10 IMPLANT
TOWEL GREEN STERILE (TOWEL DISPOSABLE) ×3 IMPLANT
TUBE CONNECTING 12'X1/4 (SUCTIONS) ×1
TUBE CONNECTING 12X1/4 (SUCTIONS) ×2 IMPLANT
YANKAUER SUCT BULB TIP NO VENT (SUCTIONS) ×3 IMPLANT

## 2019-12-19 NOTE — Progress Notes (Signed)
Dr. Tacy Dura aware of BG level.  Per Dr. Tacy Dura, okay to leave insulin pump on and running

## 2019-12-19 NOTE — Op Note (Signed)
   Date of Surgery: 12/19/2019  INDICATIONS: Kaitlin Hudson is a 64 y.o.-year-old female who has severe peripheral vascular disease.  She underwent limb salvage intervention for the right lower extremity but has had progressive gangrenous necrotic changes of both feet with dry gangrene involving both lower extremities.  Patient does not have further foot salvage intervention options and she presents at this time for bilateral transtibial amputations.Marland Kitchen  PREOPERATIVE DIAGNOSIS: Gangrene bilateral foot and ankle.  POSTOPERATIVE DIAGNOSIS: Same.  PROCEDURE: Transtibial amputation Application bilateral  Prevena wound VAC bilateral with both customizable and or throat for dressings  SURGEON: Lajoyce Corners, M.D.  ANESTHESIA:  general  IV FLUIDS AND URINE: See anesthesia.  ESTIMATED BLOOD LOSS: 200 mL.  COMPLICATIONS: None.  DESCRIPTION OF PROCEDURE: The patient was brought to the operating room and underwent a general anesthetic. After adequate levels of anesthesia were obtained patient's bilateral lower extremities were prepped using DuraPrep draped into a sterile field. A timeout was called. Both feet were draped out of the sterile field with impervious stockinette.    Attention was first focused on the right lower extremity.   A transverse incision was made 11 cm distal to the tibial tubercle. This curved proximally and a large posterior flap was created. The tibia was transected 1 cm proximal to the skin incision. The fibula was transected just proximal to the tibial incision. The tibia was beveled anteriorly. A large posterior flap was created. The sciatic nerve was pulled cut and allowed to retract. The vascular bundles were suture ligated with 2-0 silk. The deep and superficial fascial layers were closed using #1 Vicryl. The skin was closed using staples and 2-0 nylon.   The procedure was then completed on the left lower extremity using the same technique.  At completion of both amputations wound VAC  dressings were applied  The wounds were covered with a Prevena wound VAC both customizable and [name]. There was a good suction fit. A prosthetic shrinker will be applied. Patient was extubated taken to the PACU in stable condition.   DISCHARGE PLANNING:  Antibiotic duration: 24 hours  Weightbearing: Transfer training only  Pain medication: Opioid pathway  Dressing care/ Wound VAC: Continue wound VAC for 1 week  Discharge to: Skilled nursing facility  Follow-up: In the office 1 week post operative.  Aldean Baker, MD [facility] 10:56 AM

## 2019-12-19 NOTE — Progress Notes (Addendum)
Inpatient Diabetes Program Recommendations  AACE/ADA: New Consensus Statement on Inpatient Glycemic Control (2015)  Target Ranges:  Prepandial:   less than 140 mg/dL      Peak postprandial:   less than 180 mg/dL (1-2 hours)      Critically ill patients:  140 - 180 mg/dL   Lab Results  Component Value Date   GLUCAP 170 (H) 12/19/2019   HGBA1C 7.2 (H) 08/15/2019    Review of Glycemic Control Results for ASIA, [place] (MRN [id number]) as of 12/19/2019 12:05  Ref. Range 12/19/2019 07:56 12/19/2019 10:57  Glucose-Capillary Latest Ref Range: 70 - 99 mg/dL 086 (H) 578 (H)   Diabetes history:  DM1 Outpatient Diabetes medications:  Medtronic Insulin pump with Humalog Basal 33.55 units/24 hrs Current orders for Inpatient glycemic control:  Insulin Pump Order Set  Inpatient Diabetes Program Recommendations:     Went to PACU to assess patients ability to function pump.  She is Not able to function insulin pump at this time.  Please discontinue insulin pump order set and add,  1-Lantus 27 units now (80% of daily basal) then remove insulin pump after 1 hour of Lantus administration 2-Novolog 0-9 units q4h   Will need meal coverage once patient is eating.    Addendum @ 69- Spoke with patient in PACU.  She does not have carb coverage with pump.  She only uses basal setting.  She corrects based on her cho's and blood sugar.  There is not a SSI.  She states she "knows how much to give".  She has had type 1 for over 60 yrs and likes to keep her blood sugar less than 125 mg/dL.  Explained that her pump will be removed and given to her husband as she is receiving IV  pain medication and will not be alert and oriented at all times to function pump.  She verbalizes understanding.  Lantus 27 units administered at 1434.  Will remove pump in 1 hr.   Addendum @ 1530- Insulin pump removed and handed to spouse,  Sharlot Gowda.  Did not remove site on abdomen as patient wanted to keep it in place.  Site was  placed on 10/5 and she would like to use site when pump can resume.      Will continue to follow while inpatient.  Thank you, Dulce Sellar, RN, BSN Diabetes Coordinator Inpatient Diabetes Program [phone] (team pager from 8a-5p)

## 2019-12-19 NOTE — Transfer of Care (Signed)
Immediate Anesthesia Transfer of Care Note  Patient: Kaitlin Hudson  Procedure(s) Performed: BILATERAL BELOW KNEE AMPUTATION (Bilateral Knee)  Patient Location: PACU  Anesthesia Type:General  Level of Consciousness: drowsy  Airway & Oxygen Therapy: Patient Spontanous Breathing and Patient connected to nasal cannula oxygen  Post-op Assessment: Report given to RN and Post -op Vital signs reviewed and stable  Post vital signs: Reviewed and stable  Last Vitals:  Vitals Value Taken Time  BP    Temp    Pulse    Resp    SpO2      Last Pain:  Vitals:   12/19/19 0811  PainSc: 5          Complications: No complications documented.

## 2019-12-19 NOTE — H&P (Signed)
Kaitlin Hudson is an 64 y.o. female.   Chief Complaint: Bilateral Ischemic Ulcers and pain HPI: Patient is a 64 year old woman who presents in follow-up for both lower extremities.  Patient is status post debridement and skin graft for the right Achilles ulcer and is 4 months out from surgery.  She has been using Prisma and a compression wrap she states she is unable to tolerate the dressing at this time.  Patient also complains of increasing blistering maceration drainage and necrotic changes to the toes of the left foot.  Patient also complains of cramping in her thumbs she states that recently she has started Lasix 40 mg a day.   Past Medical History:  Diagnosis Date  . Arthritis, rheumatoid (HCC)   . CHF (congestive heart failure) (HCC)    pt denies this dx  . Coronary artery disease   . Diabetes mellitus without complication (HCC)   . Headache   . Hyperlipidemia   . Hypertension   . Latent tuberculosis    pt denies this dx  . Peripheral vascular disease (HCC)    blood clot in left leg  . Type 1 diabetes [facility])     Past Surgical History:  Procedure Laterality Date  . CARDIAC CATHETERIZATION  11/02/2019  . CARPAL TUNNEL RELEASE Bilateral   . EYE SURGERY Bilateral    09/2018  . FINGER SURGERY  07/11/2014   right middle finger - nodule removed  . FINGER SURGERY  02/20/2017   right middle finger  . FOOT SURGERY Right 02/26/2014   screw  . I & D EXTREMITY Right 08/15/2019   Procedure: RIGHT ACHILLES DEBRIDEMENT, POSSIBLE SKIN GRAFT;  Surgeon: Nadara Mustard, MD;  Location: [facility] OR;  Service: Orthopedics;  Laterality: Right;  . JOINT REPLACEMENT    . SHOULDER SURGERY Right 01/06/2005  . TRIGGER FINGER RELEASE  12/202002   left hand -   . WRIST SURGERY Left 10/23/2004   and middle finger  . WRIST SURGERY Right 11/05/2005  . WRIST SURGERY Right 03/11/2006   cyst removed    History reviewed. No pertinent family history. Social History:  reports that she has never smoked. She has  never used smokeless tobacco. She reports that she does not drink alcohol and does not use drugs.  Allergies:  Allergies  Allergen Reactions  . Aspirin Other (See Comments)    Nose bleed  . Latex Rash  . Other Rash    VINYL   . Cimzia [Certolizumab Pegol] Other (See Comments)    bleeding  . Clindamycin/Lincomycin Diarrhea  . Diclofenac Nausea Only  . Enbrel [Etanercept] Hives  . Erythromycin Other (See Comments)    Severe stomach cramping  . Lasix [Furosemide] Itching  . Leflunomide Other (See Comments)    Fatigue/anxiety  . Rynatan [Chlorpheniramine-Phenyleph Er] Other (See Comments)    Stomach ache   . Statins Other (See Comments)    Myalgias.  . Tramadol Nausea And Vomiting  . Chlorhexidine Rash    CHG wipes  . Keflet [Cephalexin] Rash    Severe stomach cramping  . Penicillins Rash  . Pneumococcal Polysaccharide Vaccine Rash    All over body  . Sulfa Antibiotics Rash    No medications prior to admission.    No results found for this or any previous visit (from the past 48 hour(s)). No results found.  Review of Systems  All other systems reviewed and are negative.   Height 5\' 4"  (1.626 m), weight 83.9 kg. Physical Exam  Patient is alert,  oriented, no adenopathy, well-dressed, normal affect, normal respiratory effort. Examination patient has necrosis and pain of all the toes of the left foot.  The right heel ulcer is now necrotic with necrotic exposed bone.  Patient has completed a course of IV antibiotics through the PICC line. Heart RRR Lungs Clear Assessment/Plan 1. 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 PVD (peripheral vascular disease) (HCC)     Plan: Recommended coconut water to try to improve her electrolyte balance most likely altered by her Lasix.  Discussed that with the progressive gangrenous changes to both lower extremities and increasing pain in both lower extremities  recommended proceeding with bilateral transtibial amputation.  Discussed that she would definitely need a motorized wheelchair or scooter in order to ambulate within her home.   [place] Dusty Wagoner, PA 12/19/2019, 6:37 AM

## 2019-12-19 NOTE — Care Management (Signed)
Consult for SNF. TOC team will follow and await post op PT/OT evaluations.   Ronny Flurry RN

## 2019-12-19 NOTE — Consult Note (Signed)
[facility] Medical Consultation  Mckala Fontenot BEM:[id number] DOB: [birth year] DOA: 12/19/2019 PCP: Earley Abide. (Inactive)   Requesting physician: Dr Lajoyce Corners Date of consultation: 12/19/2019 Reason for consultation: diabetes, HTN management   HPI:  This is a 64 year old female with severe peripheral vascular disease, diabetes mellitus type 1 on insulin pump, hypertension, hyperlipidemia, coronary artery disease, RA who is here in the hospital for elective bilateral BKA's.  She has had several prior limb salvage interventions in the past but eventually required amputation which was performed this morning by Dr. Lajoyce Corners.  Given history of coronary artery disease, hypertension, insulin dependent diabetes mellitus we were consulted  On my evaluation patient is seen in PACU, she just woke up from anesthesia and is in excruciating pain.  She is crying and cannot stand still.  She cannot tell me though that she has her insulin pump with her and she is able to use it while hospitalized.  She has no chest pain, shortness of breath, abdominal pain, nausea or vomiting and other than the bilateral lower extremity postop pain she is not having any significant symptoms  Review of Systems:  As per HPI, otherwise 10 point review of systems negative   Impression/Recommendations  Principal problem Severe peripheral vascular disease status post bilateral BKA -will be admitted to the hospital by orthopedic surgery, management per primary team, pain control, wound VAC in place.  Suspect she will need SNF  Active problems Coronary artery disease, chronic diastolic CHF-following with cardiology at [facility], she is status post cardiac cath in August 2021 and underwent rotational atherectomy assisted multivessel PCI.  -Cath 8/24 with multiple drug-eluting stents to LCx, LAD, RCA  -Most recent 2D echo showed LVH, EF 65-70% -She is recommended to be on Plavix along with Eliquis for at least 12 months, per  orthopedics she is to continue Plavix but hold Eliquis -Resume metoprolol and Bumex  Rheumatoid arthritis-on chronic prednisone therapy, resume  Type 1 diabetes mellitus -patient has a insulin pump with her, allow use, consult diabetes coordinator  Essential hypertension with intermittent hypotension as an outpatient -on metoprolol, bumex, resume, she is hypertensive but probably due to pain, she has a history of hypotension as an outpatient  Chronic kidney disease stage IIIb-Baseline creatinine around 1.3, currently at baseline at 1.37.  Continue to monitor, avoid nephrotoxins  Our hospitalist team will followup again tomorrow. Please contact me if I can be of assistance in the meanwhile. Thank you for this consultation.  Scheduled Meds: . dexmedetomidine      . HYDROmorphone      . ondansetron      . scopolamine  1 patch Transdermal Q72H   Continuous Infusions: . lactated ringers     PRN Meds:.acetaminophen **OR** acetaminophen (TYLENOL) oral liquid 160 mg/5 mL, fentaNYL (SUBLIMAZE) injection, HYDROmorphone (DILAUDID) injection, meperidine (DEMEROL) injection, ondansetron (ZOFRAN) IV, oxyCODONE **OR** oxyCODONE   Past Medical History:  Diagnosis Date  . Arthritis, rheumatoid (HCC)   . CHF (congestive heart failure) (HCC)    pt denies this dx  . Coronary artery disease   . Diabetes mellitus without complication (HCC)   . Headache   . Hyperlipidemia   . Hypertension   . Latent tuberculosis    pt denies this dx  . Peripheral vascular disease (HCC)    blood clot in left leg  . Type 1 diabetes [facility])    Past Surgical History:  Procedure Laterality Date  . CARDIAC CATHETERIZATION  11/02/2019  . CARPAL TUNNEL RELEASE Bilateral   .  EYE SURGERY Bilateral    09/2018  . FINGER SURGERY  07/11/2014   right middle finger - nodule removed  . FINGER SURGERY  02/20/2017   right middle finger  . FOOT SURGERY Right 02/26/2014   screw  . I & D EXTREMITY Right 08/15/2019   Procedure:  RIGHT ACHILLES DEBRIDEMENT, POSSIBLE SKIN GRAFT;  Surgeon: Nadara Mustard, MD;  Location: [facility] OR;  Service: Orthopedics;  Laterality: Right;  . JOINT REPLACEMENT    . SHOULDER SURGERY Right 01/06/2005  . TRIGGER FINGER RELEASE  12/202002   left hand -   . WRIST SURGERY Left 10/23/2004   and middle finger  . WRIST SURGERY Right 11/05/2005  . WRIST SURGERY Right 03/11/2006   cyst removed   Social History:  reports that she has never smoked. She has never used smokeless tobacco. She reports that she does not drink alcohol and does not use drugs.  Allergies  Allergen Reactions  . Aspirin Other (See Comments)    Nose bleed  . Latex Rash  . Other Rash    VINYL   . Cimzia [Certolizumab Pegol] Other (See Comments)    bleeding  . Clindamycin/Lincomycin Diarrhea  . Diclofenac Nausea Only  . Enbrel [Etanercept] Hives  . Erythromycin Other (See Comments)    Severe stomach cramping  . Lasix [Furosemide] Itching  . Leflunomide Other (See Comments)    Fatigue/anxiety  . Rynatan [Chlorpheniramine-Phenyleph Er] Other (See Comments)    Stomach ache   . Statins Other (See Comments)    Myalgias.  . Tramadol Nausea And Vomiting  . Chlorhexidine Rash    CHG wipes  . Keflet [Cephalexin] Rash    Severe stomach cramping  . Penicillins Rash  . Pneumococcal Polysaccharide Vaccine Rash    All over body  . Sulfa Antibiotics Rash   History reviewed. No pertinent family history.  Prior to Admission medications   Medication Sig Start Date End Date Taking? Authorizing Provider  acetaminophen (TYLENOL) 500 MG tablet Take 2 tablets (1,000 mg total) by mouth every 8 (eight) hours as needed for moderate pain. 12/25/18  Yes Horton, Mayer Masker, MD  apixaban (ELIQUIS) 5 MG TABS tablet Take 5 mg by mouth 2 (two) times daily.   Yes [provider]  bumetanide (BUMEX) 2 MG tablet Take 2 mg by mouth daily.   Yes [provider]  cholecalciferol (VITAMIN D) 25 MCG (1000 UNIT) tablet Take  1,000 Units by mouth daily.   Yes [provider]  clopidogrel (PLAVIX) 75 MG tablet Take 75 mg by mouth daily.   Yes [provider]  HUMALOG 100 UNIT/ML injection as directed. Via Insulin Pump 07/31/19  Yes [provider]  metoprolol tartrate (LOPRESSOR) 25 MG tablet Take 12.5 mg by mouth 2 (two) times daily.   Yes [provider]  oxyCODONE (OXY IR/ROXICODONE) 5 MG immediate release tablet Take 1-2 tablets (5-10 mg total) by mouth every 4 (four) hours as needed for moderate pain (pain score 4-6). Patient taking differently: Take 5-10 mg by mouth at bedtime.  11/12/19  Yes Persons, [place], PA  polyethylene glycol (MIRALAX / GLYCOLAX) 17 g packet Take 17 g by mouth daily as needed for moderate constipation.   Yes [provider]  predniSONE (DELTASONE) 5 MG tablet Take 5-10 mg by mouth See admin instructions. Take 10 mg by mouth in the morning & take 5 mg by mouth at night.   Yes [provider]  bisacodyl (DULCOLAX) 10 MG suppository Place 1 suppository (  10 mg total) rectally daily as needed for moderate constipation. Patient not taking: Reported on 12/13/2019 08/20/19   Persons, [place], [place]  docusate sodium (COLACE) 100 MG capsule Take 1 capsule (100 mg total) by mouth 2 (two) times daily. 08/20/19   Persons, [place], PA  glucose blood (ONETOUCH ULTRA) test strip Use 6 times daily. E11.65 on insulin pump 06/06/19   [provider]  Insulin Human (INSULIN PUMP) SOLN Inject into the skin as directed. With humalog insulin    [provider]  Insulin Syringe-Needle U-100 (INSULIN SYRINGE .3CC/31GX5/16") 31G X 5/16" 0.3 ML MISC Use 1 time daily 05/14/19   [provider]   Physical Exam: Blood pressure (!) 182/58, pulse 96, temperature (!) 97.1 F (36.2 C), resp. rate 17, height 5\' 4"  (1.626 m), weight 83.9 kg, SpO2 100 %. Vitals:   12/19/19 0755 12/19/19 1115  BP: (!) 182/58   Pulse: 96   Resp: 17   Temp: 98.2 F  (36.8 C) (!) 97.1 F (36.2 C)  SpO2: 100%      General: Appears uncomfortable, crying at times, rocking back and forth  Eyes: No scleral icterus  ENT: Moist mucous membranes  Neck: Normal, supple  Cardiovascular: Regular rate and rhythm, slightly tachycardic, no murmurs appreciated  Respiratory: Lungs are clear on anterior auscultation, no wheezing or crackles heard  Abdomen: Nondistended, bowel sounds positive, nontender, no guarding or rebound  Skin: No rashes, bilateral wound vacs present on lower extremities  Neurologic: No focal deficits, alert and oriented x4  Labs on Admission:  Basic Metabolic Panel: Recent Labs  Lab 12/19/19 0757  NA 140  K 4.1  CL 104  CO2 20*  GLUCOSE 275*  BUN 30*  CREATININE 1.37*  CALCIUM 9.2   Liver Function Tests: No results for input(s): AST, ALT, ALKPHOS, BILITOT, PROT, ALBUMIN in the last 168 hours. No results for input(s): LIPASE, AMYLASE in the last 168 hours. No results for input(s): AMMONIA in the last 168 hours. CBC: Recent Labs  Lab 12/19/19 0757  WBC 8.6  HGB 10.2*  HCT 33.5*  MCV 92.8  PLT 365   Cardiac Enzymes: No results for input(s): CKTOTAL, CKMB, CKMBINDEX, TROPONINI in the last 168 hours. BNP: Invalid input(s): POCBNP CBG: Recent Labs  Lab 12/19/19 0756 12/19/19 1057  GLUCAP 268* 170*    Radiological Exams on Admission: No results found.   Time spent: 50 minutes  Dennies Coate [facility]  If 7PM-7AM, please contact night-coverage [email or web address]  12/19/2019, 11:32 AM

## 2019-12-19 NOTE — Anesthesia Procedure Notes (Signed)
Procedure Name: LMA Insertion °Performed by: Nahomy Limburg H, CRNA °Pre-anesthesia Checklist: Patient identified, Emergency Drugs available, Suction available and Patient being monitored °Patient Re-evaluated:Patient Re-evaluated prior to induction °Oxygen Delivery Method: Circle System Utilized °Preoxygenation: Pre-oxygenation with 100% oxygen °Induction Type: IV induction °Ventilation: Mask ventilation without difficulty °LMA: LMA inserted °LMA Size: 4.0 °Number of attempts: 1 °Airway Equipment and Method: Bite block °Placement Confirmation: positive ETCO2 °Tube secured with: Tape °Dental Injury: Teeth and Oropharynx as per pre-operative assessment  ° ° ° ° ° ° °

## 2019-12-19 NOTE — NC FL2 (Signed)
[place] MEDICAID FL2 LEVEL OF CARE SCREENING TOOL     IDENTIFICATION  Patient Name: Kaitlin Hudson Birthdate: December 27, 1955 Sex: female Admission Date (Current Location): 12/19/2019  [facility] and [place] Number:  [employer] and Address:  [facility] [place], [address], [place], [place] [address]      Provider Number: [id number]  Attending Physician Name and Address:  Nadara Mustard, MD  Relative Name and Phone Number:       Current Level of Care: Hospital Recommended Level of Care: Skilled Nursing Facility Prior Approval Number:    Date Approved/Denied:   PASRR Number: [id number]  Discharge Plan: SNF    Current Diagnoses: Patient Active Problem List   Diagnosis Date Noted  . Below-knee amputation (HCC) 12/19/2019  . Gangrene of both feet (HCC)   . Heel ulcer, right, with necrosis of muscle (HCC)   . Chronic diastolic heart failure (HCC) 09/12/2013  . Rheumatoid arthritis (HCC) 09/12/2013  . IDDM (insulin dependent diabetes mellitus) 09/09/2013  . Cellulitis of right lower extremity 09/08/2013    Orientation RESPIRATION BLADDER Height & Weight     Self, Time, Situation, Place  Normal Incontinent Weight: 184 lb 15.5 oz (83.9 kg) Height:  5\' 4"  (162.6 cm)  BEHAVIORAL SYMPTOMS/MOOD NEUROLOGICAL BOWEL NUTRITION STATUS      Continent Diet (see discharge summary)  AMBULATORY STATUS COMMUNICATION OF NEEDS Skin   Extensive Assist Verbally Surgical wounds, Wound Vac (bilateral transtibial amputations w/ prevena (incisional) wound vacs)                       Personal Care Assistance Level of Assistance  Bathing, Dressing, Feeding Bathing Assistance: Maximum assistance Feeding assistance: Independent Dressing Assistance: Maximum assistance     Functional Limitations Info  Sight, Hearing, Speech Sight Info: Adequate Hearing Info: Adequate Speech Info: Adequate    SPECIAL CARE FACTORS FREQUENCY  PT (By licensed PT), OT (By licensed  OT)     PT Frequency: 5x week OT Frequency: 5x week            Contractures      Additional Factors Info  Code Status, Allergies, Insulin Sliding Scale Code Status Info: Full Code Allergies Info: Aspirin, Latex, Other, Cimzia (Certolizumab Pegol), Clindamycin/lincomycin, Diclofenac, Enbrel (Etanercept), Erythromycin, Lasix (Furosemide), Leflunomide, Rynatan (Chlorpheniramine-phenyleph Er), Statins, Tramadol, Chlorhexidine, Keflet (Cephalexin), Penicillins, Pneumococcal Polysaccharide Vaccine, Sulfa Antibiotics   Insulin Sliding Scale Info: insulin glargine (LANTUS) injection 27 Units daily; insulin aspart (novoLOG) injection 0-9 Units every 4 hours       Current Medications (12/19/2019):  This is the current hospital active medication list Current Facility-Administered Medications  Medication Dose Route Frequency Provider Last Rate Last Admin  . 0.9 %  sodium chloride infusion   Intravenous Continuous Persons, 02/18/2020, [place] 75 mL/hr at 12/19/19 1604 New Bag at 12/19/19 1604  . [START ON 12/20/2019] acetaminophen (TYLENOL) tablet 325-650 mg  325-650 mg Oral Q6H PRN Persons, 02/19/2020, PA      . bumetanide (BUMEX) tablet 2 mg  2 mg Oral Daily Persons, [place], [place]      . cholecalciferol (VITAMIN D) tablet 1,000 Units  1,000 Units Oral Daily Persons, [place], PA   1,000 Units at 12/19/19 1601  . clindamycin (CLEOCIN) IVPB 600 mg  600 mg Intravenous Q6H Persons, 02/18/20, PA 100 mL/hr at 12/19/19 1605 600 mg at 12/19/19 1605  . clopidogrel (PLAVIX) tablet 75 mg  75 mg Oral Daily Persons, 02/18/20, [place]  75 mg at 12/19/19 1601  . dexmedetomidine 80 MCG/20ML SOLN           . docusate sodium (COLACE) capsule 100 mg  100 mg Oral BID Persons, [place], PA   100 mg at 12/19/19 1601  . HYDROmorphone (DILAUDID) injection 0.5 mg  0.5 mg Intravenous Q4H PRN Persons, [place], PA      . insulin aspart (novoLOG) injection 0-9 Units  0-9 Units Subcutaneous Q4H Persons, [place], PA      .  insulin glargine (LANTUS) injection 27 Units  27 Units Subcutaneous Daily Persons, [place], [place]   27 Units at 12/19/19 1434  . insulin pump   Subcutaneous UD Persons, [place], [place]      . metoCLOPramide (REGLAN) tablet 5-10 mg  5-10 mg Oral Q8H PRN Persons, [place], PA       Or  . metoCLOPramide (REGLAN) injection 5-10 mg  5-10 mg Intravenous Q8H PRN Persons, [place], PA      . metoprolol tartrate (LOPRESSOR) tablet 12.5 mg  12.5 mg Oral BID Persons, [place], PA      . ondansetron [facility]) tablet 4 mg  4 mg Oral Q6H PRN Persons, [place], PA       Or  . ondansetron (ZOFRAN) injection 4 mg  4 mg Intravenous Q6H PRN Persons, [place], PA      . oxyCODONE (Oxy IR/ROXICODONE) 5 MG immediate release tablet           . oxyCODONE (Oxy IR/ROXICODONE) immediate release tablet 5-10 mg  5-10 mg Oral Q4H PRN Persons, [place], [place]      . Melene Muller ON 12/20/2019] predniSONE (DELTASONE) tablet 10 mg  10 mg Oral Q breakfast Nadara Mustard, MD       And  . predniSONE (DELTASONE) tablet 5 mg  5 mg Oral QPC supper Nadara Mustard, MD         Discharge Medications: Please see discharge summary for a list of discharge medications.  Relevant Imaging Results:  Relevant Lab Results:   Additional Information    Doy Hutching, LCSW

## 2019-12-19 NOTE — Anesthesia Postprocedure Evaluation (Signed)
Anesthesia Post Note  Patient: Kaitlin Hudson  Procedure(s) Performed: BILATERAL BELOW KNEE AMPUTATION (Bilateral Knee)     Patient location during evaluation: PACU Anesthesia Type: General Level of consciousness: awake and alert Pain management: pain level controlled Vital Signs Assessment: post-procedure vital signs reviewed and stable Respiratory status: spontaneous breathing, nonlabored ventilation, respiratory function stable and patient connected to nasal cannula oxygen Cardiovascular status: blood pressure returned to baseline and stable Postop Assessment: no apparent nausea or vomiting Anesthetic complications: no   No complications documented.  Last Vitals:  Vitals:   12/19/19 1455 12/19/19 1543  BP: (!) 120/53 (!) 144/57  Pulse: 82 (!) 101  Resp: 15 16  Temp:  36.8 C  SpO2: 98% 100%    Last Pain:  Vitals:   12/19/19 1559  TempSrc:   PainSc: 7                  Lamiya Naas

## 2019-12-20 ENCOUNTER — Encounter (HOSPITAL_COMMUNITY): Payer: Self-pay | Admitting: Orthopedic Surgery

## 2019-12-20 LAB — BASIC METABOLIC PANEL
Anion gap: 10 (ref 5–15)
BUN: 28 mg/dL — ABNORMAL HIGH (ref 8–23)
CO2: 21 mmol/L — ABNORMAL LOW (ref 22–32)
Calcium: 8.3 mg/dL — ABNORMAL LOW (ref 8.9–10.3)
Chloride: 106 mmol/L (ref 98–111)
Creatinine, Ser: 1.25 mg/dL — ABNORMAL HIGH (ref 0.44–1.00)
GFR calc non Af Amer: 45 mL/min — ABNORMAL LOW (ref >60)
Glucose, Bld: 182 mg/dL — ABNORMAL HIGH (ref 70–99)
Potassium: 5.2 mmol/L — ABNORMAL HIGH (ref 3.5–5.1)
Sodium: 137 mmol/L (ref 135–145)

## 2019-12-20 LAB — HEMOGLOBIN AND HEMATOCRIT, BLOOD
HCT: 26.8 % — ABNORMAL LOW (ref 36.0–46.0)
Hemoglobin: 8.5 g/dL — ABNORMAL LOW (ref 12.0–15.0)

## 2019-12-20 LAB — GLUCOSE, CAPILLARY
Glucose-Capillary: 162 mg/dL — ABNORMAL HIGH (ref 70–99)
Glucose-Capillary: 165 mg/dL — ABNORMAL HIGH (ref 70–99)
Glucose-Capillary: 176 mg/dL — ABNORMAL HIGH (ref 70–99)
Glucose-Capillary: 229 mg/dL — ABNORMAL HIGH (ref 70–99)
Glucose-Capillary: 239 mg/dL — ABNORMAL HIGH (ref 70–99)
Glucose-Capillary: 253 mg/dL — ABNORMAL HIGH (ref 70–99)

## 2019-12-20 LAB — ABO/RH: ABO/RH(D): O POS

## 2019-12-20 LAB — CBC
HCT: 23.3 % — ABNORMAL LOW (ref 36.0–46.0)
Hemoglobin: 7.2 g/dL — ABNORMAL LOW (ref 12.0–15.0)
MCH: 28.3 pg (ref 26.0–34.0)
MCHC: 30.9 g/dL (ref 30.0–36.0)
MCV: 91.7 fL (ref 80.0–100.0)
Platelets: 303 10*3/uL (ref 150–400)
RBC: 2.54 MIL/uL — ABNORMAL LOW (ref 3.87–5.11)
RDW: 15 % (ref 11.5–15.5)
WBC: 13.1 10*3/uL — ABNORMAL HIGH (ref 4.0–10.5)
nRBC: 0 % (ref 0.0–0.2)

## 2019-12-20 LAB — PREPARE RBC (CROSSMATCH)

## 2019-12-20 MED ORDER — SODIUM CHLORIDE 0.9% IV SOLUTION: Freq: Once | INTRAVENOUS | Status: AC

## 2019-12-20 MED ORDER — HYDROXYZINE HCL 25 MG PO TABS
25.0000 mg | ORAL_TABLET | Freq: Once | ORAL | Status: DC
  Filled 2019-12-20: qty 1

## 2019-12-20 MED ORDER — APIXABAN 5 MG PO TABS
5.0000 mg | ORAL_TABLET | Freq: Two times a day (BID) | ORAL | Status: DC
  Administered 2019-12-20 – 2019-12-23 (×6): 5 mg via ORAL
  Filled 2019-12-20 (×7): qty 1

## 2019-12-20 MED ORDER — GABAPENTIN 100 MG PO CAPS
100.0000 mg | ORAL_CAPSULE | Freq: Three times a day (TID) | ORAL | Status: DC
  Filled 2019-12-20 (×4): qty 1

## 2019-12-20 NOTE — Evaluation (Signed)
Occupational Therapy Evaluation Patient Details Name: Kaitlin Hudson MRN: [id number] DOB: Oct 21, 1955 Today's Date: 12/20/2019    History of Present Illness This 64 y.o. female admitted for bil. BKAs due to bil. gangrene.  PMH includes:  DM type 1, PVD, latent TB, HTN, CAD, CHF, RA, s/p bil. wrist surgery, s/p trigger finger release, s/p Rt shoulder surgery, s/p achilles tendon debridement.     Clinical Impression   Pt admitted with above. She demonstrates the below listed deficits and will benefit from continued OT to maximize safety and independence with BADLs.  Pt presents to OT with generalized weakness, increased pain, impaired balance, decreased activity tolerance.  She currently is limited by pain, but she is able to pull into long sitting in the bed with min guard assist, and requires set up assist - total A for ADLs.  She lives with her spouse, who is very supportive, and was mod I- max A for ADLs PTA, due to pain. Feel she will benefit from post acute rehab at discharge.       Follow Up Recommendations  CIR    Equipment Recommendations  None recommended by OT    Recommendations for Other Services Rehab consult     Precautions / Restrictions Precautions Precautions: Fall      Mobility Bed Mobility                  Transfers                 General transfer comment: Pt unable to tolerate this date     Balance Overall balance assessment: Needs assistance Sitting-balance support: Bilateral upper extremity supported Sitting balance-Leahy Scale: Poor Sitting balance - Comments: requires UE support in long sitting in bed                                    ADL either performed or assessed with clinical judgement   ADL Overall ADL's : Needs assistance/impaired Eating/Feeding: Independent   Grooming: Wash/dry hands;Wash/dry face;Oral care;Brushing hair;Set up;Bed level   Upper Body Bathing: Set up;Bed level   Lower Body Bathing: Moderate  assistance;Bed level   Upper Body Dressing : Moderate assistance;Bed level   Lower Body Dressing: Maximal assistance;Bed level   Toilet Transfer: Total assistance   Toileting- Clothing Manipulation and Hygiene: Total assistance;Bed level         General ADL Comments: Pt currently limited by pain      Vision         Perception     Praxis      Pertinent Vitals/Pain Pain Assessment: Faces Faces Pain Scale: Hurts whole lot Pain Location: bil. LEs with movement  Pain Descriptors / Indicators: Grimacing;Guarding;Burning Pain Intervention(s): Monitored during session;Repositioned     Hand Dominance Right   Extremity/Trunk Assessment Upper Extremity Assessment Upper Extremity Assessment: Generalized weakness (h/o RA )   Lower Extremity Assessment Lower Extremity Assessment: Defer to PT evaluation   Cervical / Trunk Assessment Cervical / Trunk Assessment: Normal   Communication Communication Communication: No difficulties   Cognition Arousal/Alertness: Awake/alert Behavior During Therapy: Anxious Overall Cognitive Status: Within Functional Limits for tasks assessed                                     General Comments       Exercises Exercises: Other exercises Other Exercises  Other Exercises: Pt able to pull self into long sitting with supervision using bil. bedrails    Shoulder Instructions      Home Living Family/patient expects to be discharged to:: Private residence Living Arrangements: Spouse/significant other Available Help at Discharge: Family;Available 24 hours/day Type of Home: House Home Access: Ramped entrance     Home Layout: One level     Bathroom Shower/Tub: Producer, television/film/video: Standard Bathroom Accessibility: Yes   Home Equipment: Environmental consultant - 2 wheels;Bedside commode;Wheelchair - Engineer, technical sales - power;Tub bench   Additional Comments: spouse very supportive and has been assisting with medical care        Prior Functioning/Environment Level of Independence: Needs assistance  Gait / Transfers Assistance Needed: mod I with RW  ADL's / Homemaking Assistance Needed: Spouse assisted with ADLs             OT Problem List: Decreased strength;Decreased activity tolerance;Impaired balance (sitting and/or standing);Decreased knowledge of use of DME or AE;Pain      OT Treatment/Interventions: Self-care/ADL training;Therapeutic exercise;DME and/or AE instruction;Therapeutic activities;Patient/family education;Balance training    OT Goals(Current goals can be found in the care plan section) Acute Rehab OT Goals Patient Stated Goal: to get mobile again and to have less pain  OT Goal Formulation: With patient/family Time For Goal Achievement: 01/03/20 Potential to Achieve Goals: Good ADL Goals Pt Will Perform Upper Body Bathing: with set-up;sitting Pt Will Perform Lower Body Bathing: with min assist;sitting/lateral leans;bed level Pt Will Perform Upper Body Dressing: with set-up;sitting Pt Will Perform Lower Body Dressing: with min assist;sitting/lateral leans;bed level Pt Will Transfer to Toilet: with mod assist;anterior/posterior transfer;bedside commode Pt Will Perform Toileting - Clothing Manipulation and hygiene: with mod assist;sitting/lateral leans Pt/caregiver will Perform Home Exercise Program: Increased strength;Right Upper extremity;Left upper extremity;With theraband;With written HEP provided;Independently  OT Frequency: Min 2X/week   Barriers to D/C:            Co-evaluation PT/OT/SLP Co-Evaluation/Treatment: Yes Reason for Co-Treatment: For patient/therapist safety;To address functional/ADL transfers   OT goals addressed during session: Strengthening/ROM;ADL's and self-care      AM-PAC OT "6 Clicks" Daily Activity     Outcome Measure Help from another person eating meals?: None Help from another person taking care of personal grooming?: A Little Help from another  person toileting, which includes using toliet, bedpan, or urinal?: Total Help from another person bathing (including washing, rinsing, drying)?: A Lot Help from another person to put on and taking off regular upper body clothing?: A Lot Help from another person to put on and taking off regular lower body clothing?: A Lot 6 Click Score: 14   End of Session Nurse Communication: Mobility status  Activity Tolerance: Patient limited by pain Patient left: in bed;with call bell/phone within reach;with family/visitor present  OT Visit Diagnosis: Pain;Muscle weakness (generalized) (M62.81) Pain - Right/Left: Right Pain - part of body: Leg                Time: 4656-8127 OT Time Calculation (min): 32 min Charges:  OT General Charges $OT Visit: 1 Visit OT Evaluation $OT Eval Moderate Complexity: 1 Mod  Levonte Molina C., OTR/L Acute Rehabilitation Services Pager [phone] Office [phone]    Jeani Hawking M 12/20/2019, 11:41 AM

## 2019-12-20 NOTE — Progress Notes (Signed)
Rehab Admissions Coordinator Note:  Patient was screened by Clois Dupes for appropriateness for an Inpatient Acute Rehab Consult per therapy recommendations.   At this time, we are recommending Inpatient Rehab consult if patient would like to be considered for CIR admit vs SNF placement. Please advise.Clois Dupes RN MSN 12/20/2019, 12:41 PM  I can be reached at [phone].

## 2019-12-20 NOTE — Progress Notes (Signed)
Patient is postop day 1 status post bilateral below-knee amputations.  She is sitting up in bed.  She is somewhat tearful as would be expected.  She does say her pain is well controlled  Vital signs stable afebrile.  Her hemoglobin this morning is 7.2.  Slightly elevated renal function and potassium.  Wound vacs are both function functioning well with 1 green check.  There is 0 cc in the canisters.  Discussed with Dr. Lajoyce Corners.  We will recheck H&H tomorrow.  If continues to decrease would transfuse blood.  Also would transfuse today if recommended by hospitalist or symptomatic anemia.  Will work with PT today will have transfer out to skilled nursing when medically stable.  Patient and I discussed a power wheelchair because of her rheumatoid arthritis and limited capability to use her hands and arms

## 2019-12-20 NOTE — Progress Notes (Signed)
Inpatient Diabetes Program Recommendations  AACE/ADA: New Consensus Statement on Inpatient Glycemic Control (2015)  Target Ranges:  Prepandial:   less than 140 mg/dL      Peak postprandial:   less than 180 mg/dL (1-2 hours)      Critically ill patients:  140 - 180 mg/dL   Lab Results  Component Value Date   GLUCAP 176 (H) 12/20/2019   HGBA1C 6.3 (H) 12/19/2019    Review of Glycemic Control  Inpatient Diabetes Program Recommendations:   To prepare to restart insulin pump tomorrow: -Discontinue Lantus insulin since given this am -Restart insulin pump @ 8 am tomorrow 10/8 (24 hrs after Lantus given) and discontinue Novolog correction @ time insulin pump is restarted.  Thank you, Billy Fischer. Najat Olazabal, RN, MSN, CDE  Diabetes Coordinator Inpatient Glycemic Control Team Team Pager [phone] (8am-5pm) 12/20/2019 11:20 AM

## 2019-12-20 NOTE — Evaluation (Signed)
Physical Therapy Evaluation Patient Details Name: Kaitlin Hudson MRN: [id number] DOB: April 29, 1955 Today's Date: 12/20/2019   History of Present Illness  This 64 y.o. female admitted for bil. BKAs due to bil. gangrene.  PMH includes:  DM type 1, PVD, latent TB, HTN, CAD, CHF, RA, s/p bil. wrist surgery, s/p trigger finger release, s/p Rt shoulder surgery, s/p achilles tendon debridement.    Clinical Impression  Prior to admission, pt lives with her spouse, uses a walker for ambulation, and has been requiring assist for some ADL's since June. Pt presents with decreased functional mobility secondary to pain, balance deficits, weakness, and decreased activity tolerance. Session focused on initiation of bed level amputee exercises, education on positioning, phantom pain and desensitization techniques, prosthetic expectations. Pt able to progress to long sitting position x 5 with use of bed rails for pre transfer training. Further mobility deferred today due to pt pain levels. Suspect good progress given appropriate pain control/management based on pt motivation, age, and family support. Recommending post acute rehab to maximize functional mobility and decrease caregiver burden.     Follow Up Recommendations CIR    Equipment Recommendations  Other (comment) (defer)    Recommendations for Other Services       Precautions / Restrictions Precautions Precautions: Fall Restrictions Weight Bearing Restrictions: Yes RLE Weight Bearing: Non weight bearing LLE Weight Bearing: Non weight bearing      Mobility  Bed Mobility Overal bed mobility: Needs Assistance Bed Mobility: Supine to Sit     Supine to sit: Supervision     General bed mobility comments: Supine to long sit with use of bed rail x 5 without physical assist  Transfers                 General transfer comment: Pt unable to tolerate this date   Ambulation/Gait                Stairs            Wheelchair  Mobility    Modified Rankin (Stroke Patients Only)       Balance Overall balance assessment: Needs assistance Sitting-balance support: Bilateral upper extremity supported Sitting balance-Leahy Scale: Poor Sitting balance - Comments: requires UE support in long sitting in bed                                      Pertinent Vitals/Pain Pain Assessment: Faces Faces Pain Scale: Hurts whole lot Pain Location: bil. LEs with movement  Pain Descriptors / Indicators: Grimacing;Guarding;Burning Pain Intervention(s): Limited activity within patient's tolerance;Monitored during session;Premedicated before session;Repositioned    Home Living Family/patient expects to be discharged to:: Private residence Living Arrangements: Spouse/significant other Available Help at Discharge: Family;Available 24 hours/day Type of Home: House Home Access: Ramped entrance     Home Layout: One level Home Equipment: Walker - 2 wheels;Bedside commode;Wheelchair - Engineer, technical sales - power;Tub bench Additional Comments: spouse very supportive and has been assisting with medical care     Prior Function Level of Independence: Needs assistance   Gait / Transfers Assistance Needed: mod I with RW   ADL's / Homemaking Assistance Needed: Spouse assisted with ADLs         Hand Dominance   Dominant Hand: Right    Extremity/Trunk Assessment   Upper Extremity Assessment Upper Extremity Assessment: Generalized weakness (h/o RA)    Lower Extremity Assessment Lower Extremity Assessment: RLE deficits/detail;LLE deficits/detail RLE  Deficits / Details: s/p BKA. Able to reach neutral knee extension actively, perform SLR LLE Deficits / Details: s/p BKA. Able to reach neutral knee extension actively, perform SLR    Cervical / Trunk Assessment Cervical / Trunk Assessment: Normal  Communication   Communication: No difficulties  Cognition Arousal/Alertness: Awake/alert Behavior During Therapy:  Anxious Overall Cognitive Status: Within Functional Limits for tasks assessed                                        General Comments      Exercises Amputee Exercises Quad Sets: Both;Supine;15 reps Hip ABduction/ADduction: Both;5 reps;Supine Straight Leg Raises: Both;5 reps;Supine Other Exercises Other Exercises: Pt able to pull self into long sitting with supervision using bil. bedrails    Assessment/Plan    PT Assessment Patient needs continued PT services  PT Problem List Decreased strength;Decreased range of motion;Decreased activity tolerance;Decreased balance;Decreased mobility;Pain       PT Treatment Interventions DME instruction;Functional mobility training;Therapeutic activities;Therapeutic exercise;Balance training;Patient/family education;Wheelchair mobility training    PT Goals (Current goals can be found in the Care Plan section)  Acute Rehab PT Goals Patient Stated Goal: to get mobile again and to have less pain  PT Goal Formulation: With patient/family Time For Goal Achievement: 01/03/20 Potential to Achieve Goals: Good    Frequency Min 3X/week   Barriers to discharge        Co-evaluation PT/OT/SLP Co-Evaluation/Treatment: Yes Reason for Co-Treatment: To address functional/ADL transfers;For patient/therapist safety PT goals addressed during session: Mobility/safety with mobility;Strengthening/ROM OT goals addressed during session: Strengthening/ROM;ADL's and self-care       AM-PAC PT "6 Clicks" Mobility  Outcome Measure Help needed turning from your back to your side while in a flat bed without using bedrails?: A Lot Help needed moving from lying on your back to sitting on the side of a flat bed without using bedrails?: Total Help needed moving to and from a bed to a chair (including a wheelchair)?: Total Help needed standing up from a chair using your arms (e.g., wheelchair or bedside chair)?: Total Help needed to walk in hospital  room?: Total Help needed climbing 3-5 steps with a railing? : Total 6 Click Score: 7    End of Session   Activity Tolerance: Patient limited by pain Patient left: in bed;with call bell/phone within reach;with bed alarm set;with family/visitor present Nurse Communication: Mobility status PT Visit Diagnosis: Pain;Other abnormalities of gait and mobility (R26.89) Pain - Right/Left:  (bilateral) Pain - part of body:  (residual limbs)    Time: 2330-0762 PT Time Calculation (min) (ACUTE ONLY): 46 min   Charges:   PT Evaluation $PT Eval Moderate Complexity: 1 Mod PT Treatments $Therapeutic Activity: 8-22 mins        Lillia Pauls, PT, DPT Acute Rehabilitation Services Pager [phone] Office [phone]   Norval Morton 12/20/2019, 12:25 PM

## 2019-12-20 NOTE — Progress Notes (Signed)
PROGRESS NOTE    Kaitlin Hudson  KRC:[id number] DOB: 11-11-1955 DOA: 12/19/2019 PCP: Earley Abide. (Inactive)    Brief Narrative: 64 year old female with type 1 diabetes since the age of [age], she has been a type I diabetic for 63 years, hypertension hyperlipidemia CAD and rheumatoid arthritis status post elective bilateral BKA after multiple failed limb salvage interventions in the past.  Assessment & Plan:   Active Problems:   Gangrene of both feet (HCC)   Below-knee amputation (HCC)     #1 status post bilateral BKA management per Ortho  #2 type 1 diabetes patient uses insulin pump for the last 17 years. Hoping to restart use of insulin pump tomorrow since she already received her basal insulin today. Discussed with diabetic coordinator. Her A1c is 6.3. Currently on Lantus 27 units daily. Will DC.  #3 history of rheumatoid arthritis on prednisone  #4 history of essential hypertension 135/69. On Lopressor 12.5 twice daily continue.  #5 history of stage IIIb CKD creatinine 1.2 down from 1.3 stable  #6 history of CAD and chronic diastolic CHF stable now. Cath 11/06/2019 with multiple drug-eluting stents. Recent echo ejection fraction 65 to 70%. She was on Plavix and Eliquis and was recommended to continue for 12 months. Currently on Plavix alone holding Eliquis. Restart Eliquis when okay with Ortho.  On Bumex 2 mg daily. #7 acute blood loss anemia/postop anemia hemoglobin 7.2 down from 10.2-with her underlying comorbidities I will transfuse 1 unit of packed RBC today.  #8 mild hyperkalemia 5.2 potassium recheck labs in a.m.  Estimated body mass index is 31.75 kg/m as calculated from the following:   Height as of this encounter: 5\' 4"  (1.626 m).   Weight as of this encounter: 83.9 kg.   Subjective: Patient resting in bed very tearful. She reports she is so used to doing things for herself and now she cannot very anxious and tearful today wants to restart her insulin  pump  Objective: Vitals:   12/19/19 2013 12/20/19 0211 12/20/19 0404 12/20/19 0928  BP: 134/60 130/62 (!) 132/54 135/69  Pulse: 98 (!) 107 99 (!) 120  Resp: 15 16 16 18   Temp: 97.6 F (36.4 C) 98.8 F (37.1 C) 98.9 F (37.2 C) 98.9 F (37.2 C)  TempSrc: Oral Oral Oral Oral  SpO2: 100% 97% 97% 97%  Weight:      Height:        Intake/Output Summary (Last 24 hours) at 12/20/2019 1111 Last data filed at 12/20/2019 0448 Gross per 24 hour  Intake 1355 ml  Output --  Net 1355 ml   Filed Weights   12/17/19 1419 12/19/19 0755  Weight: 83.9 kg 83.9 kg    Examination:  General exam: Appears calm and comfortable  Respiratory system: Clear to auscultation. Respiratory effort normal. Cardiovascular system: S1 & S2 heard, RRR. No JVD, murmurs, rubs, gallops or clicks. No pedal edema. Gastrointestinal system: Abdomen is nondistended, soft and nontender. No organomegaly or masses felt. Normal bowel sounds heard. Central nervous system: Alert and oriented. No focal neurological deficits. Extremities: Bilateral BKA  skin: No rashes, lesions or ulcers Psychiatry: Judgement and insight appear normal. Mood & affect appropriate.     Data Reviewed: I have personally reviewed following labs and imaging studies  CBC: Recent Labs  Lab 12/19/19 0757 12/20/19 0019  WBC 8.6 13.1*  HGB 10.2* 7.2*  HCT 33.5* 23.3*  MCV 92.8 91.7  PLT 365 303   Basic Metabolic Panel: Recent Labs  Lab 12/19/19 0757 12/20/19 0019  NA 140 137  K 4.1 5.2*  CL 104 106  CO2 20* 21*  GLUCOSE 275* 182*  BUN 30* 28*  CREATININE 1.37* 1.25*  CALCIUM 9.2 8.3*   GFR: Estimated Creatinine Clearance: 46.4 mL/min (A) (by C-G formula based on SCr of 1.25 mg/dL (H)). Liver Function Tests: No results for input(s): AST, ALT, ALKPHOS, BILITOT, PROT, ALBUMIN in the last 168 hours. No results for input(s): LIPASE, AMYLASE in the last 168 hours. No results for input(s): AMMONIA in the last 168 hours. Coagulation  Profile: No results for input(s): INR, PROTIME in the last 168 hours. Cardiac Enzymes: No results for input(s): CKTOTAL, CKMB, CKMBINDEX, TROPONINI in the last 168 hours. BNP (last 3 results) No results for input(s): PROBNP in the last 8760 hours. HbA1C: Recent Labs    12/19/19 1709  HGBA1C 6.3*   CBG: Recent Labs  Lab 12/19/19 1618 12/19/19 2004 12/20/19 0050 12/20/19 0400 12/20/19 0750  GLUCAP 130* 125* 165* 162* 176*   Lipid Profile: No results for input(s): CHOL, HDL, LDLCALC, TRIG, CHOLHDL, LDLDIRECT in the last 72 hours. Thyroid Function Tests: No results for input(s): TSH, T4TOTAL, FREET4, T3FREE, THYROIDAB in the last 72 hours. Anemia Panel: No results for input(s): VITAMINB12, FOLATE, FERRITIN, TIBC, IRON, RETICCTPCT in the last 72 hours. Sepsis Labs: No results for input(s): PROCALCITON, LATICACIDVEN in the last 168 hours.  Recent Results (from the past 240 hour(s))  SARS CORONAVIRUS 2 (TAT 6-24 HRS) Nasopharyngeal Nasopharyngeal Swab     Status: None   Collection Time: 12/15/19  1:29 PM   Specimen: Nasopharyngeal Swab  Result Value Ref Range Status   SARS Coronavirus 2 NEGATIVE NEGATIVE Final    Comment: (NOTE) SARS-CoV-2 target nucleic acids are NOT DETECTED.  The SARS-CoV-2 RNA is generally detectable in upper and lower respiratory specimens during the acute phase of infection. Negative results do not preclude SARS-CoV-2 infection, do not rule out co-infections with other pathogens, and should not be used as the sole basis for treatment or other patient management decisions. Negative results must be combined with clinical observations, patient history, and epidemiological information. The expected result is Negative.  Fact Sheet for Patients: HairSlick.no  Fact Sheet for Healthcare Providers: quierodirigir.com  This test is not yet approved or cleared by the [place] FDA and  has been  authorized for detection and/or diagnosis of SARS-CoV-2 by FDA under an Emergency Use Authorization (EUA). This EUA will remain  in effect (meaning this test can be used) for the duration of the COVID-19 declaration under Se ction 564(b)(1) of the Act, 21 U.S.C. section 360bbb-3(b)(1), unless the authorization is terminated or revoked sooner.  Performed at [facility], [address] [place], [place] [address]          Radiology Studies: No results found.      Scheduled Meds: . bumetanide  2 mg Oral Daily  . cholecalciferol  1,000 Units Oral Daily  . clopidogrel  75 mg Oral Daily  . docusate sodium  100 mg Oral BID  . insulin aspart  0-9 Units Subcutaneous Q4H  . insulin glargine  27 Units Subcutaneous Daily  . insulin pump   Subcutaneous UD  . metoprolol tartrate  12.5 mg Oral BID  . predniSONE  10 mg Oral Q breakfast   And  . predniSONE  5 mg Oral QPC supper   Continuous Infusions: . sodium chloride 75 mL/hr at 12/19/19 1604     LOS: 1 day     Alwyn Ren, MD  12/20/2019, 11:11 AM

## 2019-12-21 LAB — BASIC METABOLIC PANEL
Anion gap: 9 (ref 5–15)
BUN: 21 mg/dL (ref 8–23)
CO2: 24 mmol/L (ref 22–32)
Calcium: 8.4 mg/dL — ABNORMAL LOW (ref 8.9–10.3)
Chloride: 105 mmol/L (ref 98–111)
Creatinine, Ser: 1.09 mg/dL — ABNORMAL HIGH (ref 0.44–1.00)
GFR calc non Af Amer: 53 mL/min — ABNORMAL LOW (ref >60)
Glucose, Bld: 126 mg/dL — ABNORMAL HIGH (ref 70–99)
Potassium: 4.3 mmol/L (ref 3.5–5.1)
Sodium: 138 mmol/L (ref 135–145)

## 2019-12-21 LAB — GLUCOSE, CAPILLARY
Glucose-Capillary: 126 mg/dL — ABNORMAL HIGH (ref 70–99)
Glucose-Capillary: 127 mg/dL — ABNORMAL HIGH (ref 70–99)
Glucose-Capillary: 132 mg/dL — ABNORMAL HIGH (ref 70–99)
Glucose-Capillary: 148 mg/dL — ABNORMAL HIGH (ref 70–99)
Glucose-Capillary: 161 mg/dL — ABNORMAL HIGH (ref 70–99)
Glucose-Capillary: 68 mg/dL — ABNORMAL LOW (ref 70–99)
Glucose-Capillary: 91 mg/dL (ref 70–99)

## 2019-12-21 LAB — TYPE AND SCREEN
ABO/RH(D): O POS
Antibody Screen: NEGATIVE
Unit division: 0

## 2019-12-21 LAB — CBC
HCT: 27.6 % — ABNORMAL LOW (ref 36.0–46.0)
Hemoglobin: 8.8 g/dL — ABNORMAL LOW (ref 12.0–15.0)
MCH: 28.4 pg (ref 26.0–34.0)
MCHC: 31.9 g/dL (ref 30.0–36.0)
MCV: 89 fL (ref 80.0–100.0)
Platelets: 263 10*3/uL (ref 150–400)
RBC: 3.1 MIL/uL — ABNORMAL LOW (ref 3.87–5.11)
RDW: 14.8 % (ref 11.5–15.5)
WBC: 10.5 10*3/uL (ref 4.0–10.5)
nRBC: 0 % (ref 0.0–0.2)

## 2019-12-21 LAB — BPAM RBC
Blood Product Expiration Date: 202111052359
ISSUE DATE / TIME: 202110071504
Unit Type and Rh: 5100

## 2019-12-21 LAB — SURGICAL PATHOLOGY

## 2019-12-21 NOTE — PMR Pre-admission (Signed)
PMR Admission Coordinator Pre-Admission Assessment  Patient: Kaitlin Hudson is an 64 y.o., female MRN: [id number] DOB: 02-02-1956 Height: 5' 4"  (162.6 cm) Weight: 83.9 kg  Insurance Information HMO:     PPO:      PCP:      IPA:      80/20:      OTHER:  PRIMARY: Medicare a and b      Policy#: [id number]      Subscriber: pt Benefits:  Phone #: passport one online     Name: 10/8 Eff. Date: a 09/13/2018 and b 03/16/2019     Deduct: $1484      Out of Pocket Max: none      Life Max: none CIR: 100%      SNF: 20 full days Outpatient: 805     Co-Pay: 20% Home Health: 100%      Co-Pay: none DME: 80%     Co-Pay: 20% Providers: pt choice  SECONDARY: BCBS supplement      Policy#: [id number]  The "Data Collection Information Summary" for patients in Inpatient Rehabilitation Facilities with attached "Privacy Act [place] Records" was provided and verbally reviewed with: Patient and Family  Emergency Contact Information Contact Information    Name Relation Home Work Mobile   Dennice, Tindol [phone]  [phone]      Current Medical History  Patient Admitting Diagnosis: Bilateral BKA  History of Present Illness: 64 year old right-handed female history of RA with chronic prednisone, diastolic congestive heart failure, CAD followed at [facility] status post cardiac cath August 2021 underwent rotational atherectomy assisted multivessel PCI maintained on Plavix and Eliquis x 12 months, CKD stage II with creatinine 1.37, hypertension, hyperlipidemia, diabetes mellitus with insulin pump and peripheral neuropathy.  Per chart review she lives with spouse.  1 level home with ramped entrance.  Modified independent with rolling walker prior to admission.  Presented 12/19/2019 with bilateral ischemic ulcers and lower extremity rest pain.  She is status post debridement and skin graft for right Achilles ulcer 4 months out from surgery.  Patient with progressive gangrenous necrotic changes  follow-up with orthopedic services limbs not felt to be salvageable and underwent bilateral transtibial amputation 12/19/2019 per Dr. Sharol Given.  Wound VACS as directed.  Acute blood loss anemia 8.8.     Patient's medical record from [facility] has been reviewed by the rehabilitation admission coordinator and physician.  Past Medical History  Past Medical History:  Diagnosis Date  . Arthritis, rheumatoid ([place])   . CHF (congestive heart failure) ([place])    pt denies this dx  . Coronary artery disease   . Diabetes mellitus without complication ([place])   . Headache   . Hyperlipidemia   . Hypertension   . Latent tuberculosis    pt denies this dx  . Peripheral vascular disease ([place])    blood clot in left leg  . Type 1 diabetes ([place])     Family History   family history is not on file.  Prior Rehab/Hospitalizations Has the patient had prior rehab or hospitalizations prior to admission? Yes  Has the patient had major surgery during 100 days prior to admission? Yes   Current Medications  Current Facility-Administered Medications:  .  acetaminophen (TYLENOL) tablet 325-650 mg, 325-650 mg, Oral, Q6H PRN, Persons, Bevely Palmer, PA .  apixaban (ELIQUIS) tablet 5 mg, 5 mg, Oral, BID, Persons, Bevely Palmer, PA, 5 mg at 12/21/19 1109 .  bumetanide (BUMEX) tablet 2 mg, 2 mg, Oral, Daily, Persons, Bevely Palmer,  PA, 2 mg at 12/20/19 1001 .  cholecalciferol (VITAMIN D) tablet 1,000 Units, 1,000 Units, Oral, Daily, Persons, Bevely Palmer, [place], 1,000 Units at 12/21/19 1109 .  clopidogrel (PLAVIX) tablet 75 mg, 75 mg, Oral, Daily, Persons, Bevely Palmer, [place], 75 mg at 12/21/19 1109 .  docusate sodium (COLACE) capsule 100 mg, 100 mg, Oral, BID, Persons, Bevely Palmer, PA, 100 mg at 12/21/19 1108 .  gabapentin (NEURONTIN) capsule 100 mg, 100 mg, Oral, TID, Persons, Bevely Palmer, [place] .  HYDROmorphone (DILAUDID) injection 0.5 mg, 0.5 mg, Intravenous, Q4H PRN, Persons, Bevely Palmer, PA, 0.5 mg at 12/21/19 0850 .  hydrOXYzine  (ATARAX/VISTARIL) tablet 25 mg, 25 mg, Oral, Once, Blount, Xenia T, NP .  insulin pump, , Subcutaneous, UD, Persons, Bevely Palmer, [place] .  metoCLOPramide (REGLAN) tablet 5-10 mg, 5-10 mg, Oral, Q8H PRN **OR** metoCLOPramide (REGLAN) injection 5-10 mg, 5-10 mg, Intravenous, Q8H PRN, Persons, Bevely Palmer, PA .  metoprolol tartrate (LOPRESSOR) tablet 12.5 mg, 12.5 mg, Oral, BID, Persons, Bevely Palmer, PA, 12.5 mg at 12/21/19 1108 .  ondansetron (ZOFRAN) tablet 4 mg, 4 mg, Oral, Q6H PRN **OR** ondansetron (ZOFRAN) injection 4 mg, 4 mg, Intravenous, Q6H PRN, Persons, Bevely Palmer, PA .  oxyCODONE (Oxy IR/ROXICODONE) immediate release tablet 5-10 mg, 5-10 mg, Oral, Q4H PRN, Persons, Bevely Palmer, PA, 10 mg at 12/21/19 1317 .  predniSONE (DELTASONE) tablet 10 mg, 10 mg, Oral, Q breakfast, 10 mg at 12/21/19 0850 **AND** predniSONE (DELTASONE) tablet 5 mg, 5 mg, Oral, QPC supper, Newt Minion, MD, 5 mg at 12/20/19 1801  Patients Current Diet:  Diet Order            Diet Carb Modified Fluid consistency: Thin; Room service appropriate? Yes  Diet effective now                 Precautions / Restrictions Precautions Precautions: Fall Restrictions Weight Bearing Restrictions: Yes RLE Weight Bearing: Non weight bearing LLE Weight Bearing: Non weight bearing   Has the patient had 2 or more falls or a fall with injury in the past year? No  Prior Activity Level Limited Community (1-2x/wk): needed assistance since June from spouse  Prior Functional Level Self Care: Did the patient need help bathing, dressing, using the toilet or eating? Needed some help  Indoor Mobility: Did the patient need assistance with walking from room to room (with or without device)? Needed some help  Stairs: Did the patient need assistance with internal or external stairs (with or without device)? Needed some help  Functional Cognition: Did the patient need help planning regular tasks such as shopping or remembering to take medications?  Independent  Home Assistive Devices / Equipment Home Assistive Devices/Equipment: Environmental consultant (specify type), Wheelchair, CBG Meter Home Equipment: Walker - 2 wheels, Bedside commode, Wheelchair - manual, Wheelchair - power, Tub bench  Prior Device Use: Indicate devices/aids used by the patient prior to current illness, exacerbation or injury? Manual wheelchair and Walker  Current Functional Level Cognition  Overall Cognitive Status: Within Functional Limits for tasks assessed Orientation Level: Oriented X4    Extremity Assessment (includes Sensation/Coordination)  Upper Extremity Assessment: Generalized weakness (h/o RA)  Lower Extremity Assessment: RLE deficits/detail, LLE deficits/detail RLE Deficits / Details: s/p BKA. Able to reach neutral knee extension actively, perform SLR LLE Deficits / Details: s/p BKA. Able to reach neutral knee extension actively, perform SLR    ADLs  Overall ADL's : Needs assistance/impaired Eating/Feeding: Independent Grooming: Wash/dry hands, Wash/dry face, Oral care, Brushing hair, Set up,  Bed level Upper Body Bathing: Set up, Bed level Lower Body Bathing: Moderate assistance, Bed level Upper Body Dressing : Moderate assistance, Bed level Lower Body Dressing: Maximal assistance, Bed level Toilet Transfer: Total assistance Toileting- Clothing Manipulation and Hygiene: Total assistance, Bed level General ADL Comments: Pt currently limited by pain     Mobility  Overal bed mobility: Needs Assistance Bed Mobility: Supine to Sit Supine to sit: Supervision General bed mobility comments: Supine to long sit with use of bed rail x 5 without physical assist    Transfers  General transfer comment: Pt unable to tolerate this date     Ambulation / Gait / Stairs / Office manager / Balance Dynamic Sitting Balance Sitting balance - Comments: requires UE support in long sitting in bed  Balance Overall balance assessment: Needs  assistance Sitting-balance support: Bilateral upper extremity supported Sitting balance-Leahy Scale: Poor Sitting balance - Comments: requires UE support in long sitting in bed     Special needs/care consideration visitor is spouse , Wayne Bilateral LE wound VACS to surgical sites   Previous Home Environment  Living Arrangements: Spouse/significant other  Lives With: Spouse Available Help at Discharge: Family, Available 24 hours/day Type of Home: House Home Layout: One level Home Access: Ramped entrance Bathroom Shower/Tub: Multimedia programmer: Associate Professor Accessibility: Yes Home Care Services: No Additional Comments: spouse very supportive and has been assisting with medical care   Discharge Living Setting Plans for Discharge Living Setting: Patient's home, Lives with (comment) (spouse) Type of Home at Discharge: House Discharge Home Layout: One level Discharge Home Access: [place] entrance Discharge Bathroom Shower/Tub: Walk-in shower Discharge Bathroom Toilet: Standard Discharge Bathroom Accessibility: Yes How Accessible: Accessible via walker Does the patient have any problems obtaining your medications?: No  Social/Family/Support Systems Patient Roles: Spouse Contact Information: spouse, Management consultant Anticipated Caregiver: spouse Anticipated Ambulance person Information: see above Ability/Limitations of Caregiver: none Caregiver Availability: 24/7 Discharge Plan Discussed with Primary Caregiver: Yes Is Caregiver In Agreement with Plan?: Yes Does Caregiver/Family have Issues with Lodging/Transportation while Pt is in Rehab?: No  Goals Patient/Family Goal for Rehab: supervision to min assist with PT and OT at wheelchair level Expected length of stay: ELOS 2 weeks Pt/Family Agrees to Admission and willing to participate: Yes Program Orientation Provided & Reviewed with Pt/Caregiver Including Roles  & Responsibilities: Yes  Decrease burden of Care through  IP rehab admission: n/a  Possible need for SNF placement upon discharge: not anticipated  Patient Condition: I have reviewed medical records from [facility] , spoken with CSW, and patient and spouse. I met with patient at the bedside for inpatient rehabilitation assessment.  Patient will benefit from ongoing PT and OT, can actively participate in 3 hours of therapy a day 5 days of the week, and can make measurable gains during the admission.  Patient will also benefit from the coordinated team approach during an Inpatient Acute Rehabilitation admission.  The patient will receive intensive therapy as well as Rehabilitation physician, nursing, social worker, and care management interventions.  Due to bladder management, bowel management, safety, skin/wound care, disease management, medication administration, pain management and patient education the patient requires 24 hour a day rehabilitation nursing.  The patient is currently mod to max assist with mobility and basic ADLs.  Discharge setting and therapy post discharge at home with home health is anticipated.  Patient has agreed to participate in the Acute Inpatient Rehabilitation Program and will admit today.  Preadmission  Screen Completed By:  Cleatrice Burke, 12/21/2019 1:30 PM with updates by Clemens Catholic. ______________________________________________________________________   Discussed status with Dr. Dagoberto Ligas on 12:00 at 12/22/2019 and received approval for admission today.  Admission Coordinator:  Cleatrice Burke, RN, time 8:13 Sudie Grumbling 12/23/19 with updates by Clemens Catholic  Assessment/Plan: Diagnosis: 1. Does the need for close, 24 hr/day Medical supervision in concert with the patient's rehab needs make it unreasonable for this patient to be served in a less intensive setting? Yes 2. Co-Morbidities requiring supervision/potential complications: DM, insulin pump prior, B/L BKA's, dCHF, CAD, CKD II-III, RA on chronic  prednisone, HTN, HLD  3. Due to bladder management, bowel management, safety, skin/wound care, disease management, medication administration, pain management and patient education, does the patient require 24 hr/day rehab nursing? Yes 4. Does the patient require coordinated care of a physician, rehab nurse, PT, OT, and SLP to address physical and functional deficits in the context of the above medical diagnosis(es)? Yes Addressing deficits in the following areas: balance, endurance, strength, transferring, bowel/bladder control, bathing, dressing, feeding, grooming and toileting 5. Can the patient actively participate in an intensive therapy program of at least 3 hrs of therapy 5 days a week? Yes 6. The potential for patient to make measurable gains while on inpatient rehab is good 7. Anticipated functional outcomes upon discharge from inpatient rehab: supervision and min assist PT, supervision and min assist OT, n/a SLP 8. Estimated rehab length of stay to reach the above functional goals is: 2 weeks on average 9. Anticipated discharge destination: Home 10. Overall Rehab/Functional Prognosis: good   MD Signature:

## 2019-12-21 NOTE — Progress Notes (Signed)
Physical Therapy Treatment Patient Details Name: Kaitlin Hudson MRN: [id number] DOB: 05-Dec-1955 Today's Date: 12/21/2019    History of Present Illness This 64 y.o. female admitted for bil. BKAs due to bil. gangrene.  PMH includes:  DM type 1, PVD, latent TB, HTN, CAD, CHF, RA, s/p bil. wrist surgery, s/p trigger finger release, s/p Rt shoulder surgery, s/p achilles tendon debridement.      PT Comments    Pt progressing well towards their physical therapy goals. Session focused on bed level exercises for bilateral residual limb strengthening/ROM. Pt able to progress out of bed to the chair via anterior posterior transfer (two person maximal assist required). Suspect good progress given age, motivation, and family support. Will need post acute rehab at discharge.     Follow Up Recommendations  CIR     Equipment Recommendations  Other (comment) (defer)    Recommendations for Other Services       Precautions / Restrictions Precautions Precautions: Fall Restrictions Weight Bearing Restrictions: Yes RLE Weight Bearing: Non weight bearing LLE Weight Bearing: Non weight bearing    Mobility  Bed Mobility Overal bed mobility: Needs Assistance Bed Mobility: Supine to Sit     Supine to sit: Supervision     General bed mobility comments: Supine to long sit with supervision without use of bed rails  Transfers Overall transfer level: Needs assistance Equipment used: None Transfers: Licensed conveyancer transfers: Max assist;+2 physical assistance   General transfer comment: MaxA + 2 for anterior posterior transfer from bed to chair, cues for head/hip relationship and use of arms, use of bed pad to scoot hips  Ambulation/Gait             General Gait Details: unable   Stairs             Wheelchair Mobility    Modified Rankin (Stroke Patients Only)       Balance Overall balance assessment: Needs assistance Sitting-balance  support: Bilateral upper extremity supported Sitting balance-Leahy Scale: Poor Sitting balance - Comments: requires UE support in long sitting in bed                                     Cognition Arousal/Alertness: Awake/alert Behavior During Therapy: Anxious Overall Cognitive Status: Within Functional Limits for tasks assessed                                        Exercises Amputee Exercises Quad Sets: Both;Supine;10 reps Hip ABduction/ADduction: Both;Supine;10 reps Straight Leg Raises: Both;Supine;10 reps Other Exercises Other Exercises: Pt able to pull self into long sitting with supervision using bil. bedrails     General Comments        Pertinent Vitals/Pain Pain Assessment: 0-10 Pain Score: 7  Pain Location: R residual limb with movement Pain Descriptors / Indicators: Grimacing;Guarding;Burning Pain Intervention(s): Limited activity within patient's tolerance;Monitored during session;Patient requesting pain meds-RN notified    Home Living                      Prior Function            PT Goals (current goals can now be found in the care plan section) Acute Rehab PT Goals Patient Stated Goal: to get mobile again and to have less pain  PT Goal Formulation: With patient/family Time For Goal Achievement: 01/03/20 Potential to Achieve Goals: Good Progress towards PT goals: Progressing toward goals    Frequency    Min 3X/week      PT Plan Current plan remains appropriate    Co-evaluation              AM-PAC PT "6 Clicks" Mobility   Outcome Measure  Help needed turning from your back to your side while in a flat bed without using bedrails?: A Lot Help needed moving from lying on your back to sitting on the side of a flat bed without using bedrails?: Total Help needed moving to and from a bed to a chair (including a wheelchair)?: Total Help needed standing up from a chair using your arms (e.g., wheelchair or  bedside chair)?: Total Help needed to walk in hospital room?: Total Help needed climbing 3-5 steps with a railing? : Total 6 Click Score: 7    End of Session   Activity Tolerance: Patient tolerated treatment well Patient left: with call bell/phone within reach;with family/visitor present;in chair Nurse Communication: Mobility status PT Visit Diagnosis: Pain;Other abnormalities of gait and mobility (R26.89) Pain - Right/Left:  (bilateral) Pain - part of body:  (residual limbs)     Time: 7412-8786 PT Time Calculation (min) (ACUTE ONLY): 21 min  Charges:  $Therapeutic Activity: 8-22 mins                     Lillia Pauls, PT, DPT Acute Rehabilitation Services Pager [phone] Office [phone]    Norval Morton 12/21/2019, 1:53 PM

## 2019-12-21 NOTE — TOC Progression Note (Addendum)
Transition of Care [facility]) - Progression Note    Patient Details  Name: Kaitlin Hudson MRN: [id number] Date of Birth: 1956-01-02  Transition of Care [facility]) CM/SW Contact  Doy Hutching, [place] Phone Number: 12/21/2019, 1:45 PM  Clinical Narrative:    4:57pm- I spoke with pt via room phone, she is understandably upset about admissions hold, there are several SNFs she shares with me she will not go to but was unable to give me their names. I explained referral process and she is okay with referrals and requests team f/u with her on the weekend; I will leave hand off for team to bring her any offers this weekend.   1:45pm- CSW spoke with admissions liaison Whitney, unfortunately at this time there are not any beds available for placement as admissions are on hold to their SNF until 10/18. CSW alerted [facility] w/ CIR of this update and will f/u with pt.    Expected Discharge Plan: Skilled Nursing Facility Barriers to Discharge: Continued Medical Work up, SNF Pending bed offer  Expected Discharge Plan and Services Expected Discharge Plan: Skilled Nursing Facility In-house Referral: Clinical Social Work Discharge Planning Services: CM Consult Post Acute Care Choice: Skilled Nursing Facility, IP Rehab Living arrangements for the past 2 months: Single Family Home  Readmission Risk Interventions Readmission Risk Prevention Plan 12/21/2019 08/17/2019  Post Dischage Appt - Complete  Medication Screening - Complete  Transportation Screening Complete Complete  PCP or Specialist Appt within 5-7 Days Not Complete -  Not Complete comments pt planning on CIR vs SNF -  Home Care Screening Complete -  Medication Review (RN CM) Referral to Pharmacy -  Some recent data might be hidden

## 2019-12-21 NOTE — Progress Notes (Signed)
POD 2 Bilateral BKA. Patient alert sitting up in bed. Wants PT to get her up today if possible   VSS  Hemeglobin improved to 8.8. Both vacs with 1 green check 0cc in cannister  Plan: Patient not sure about CIR has a specific nursing facility she would like, but is open to speaking with CIR today. She is ready for discharge to either CIR or SNF so hopefully this decision can be made today . Discharge tomorrow

## 2019-12-21 NOTE — Progress Notes (Signed)
Inpatient Rehabilitation Admissions Coordinator  Inpatient rehab consult received. I met with patient with his wife at bedside for rehab assessment We discussed goals and expectations of a Cir admit. Currently, I do not have a Cir bed available Saturday or Sunday to admit. She would be an excellent candidate should a bed unexpectedly become available and they would prefer to admit to CIR. Otherwise, they would like to go to [facility] for which they have made connections to be admitted. I have updated SW, Monia Pouch. I will have my weekend Admissions Coordinator, Mickel Baas, to follow up over the weekend if a Cir bed would become unexpectedly available.   Danne Baxter, RN, MSN Rehab Admissions Coordinator [phone] 12/21/2019 11:58 AM

## 2019-12-21 NOTE — Progress Notes (Signed)
PT TREATMENT  Pt seen for additional treatment session for seated therapeutic exercises for bilateral residual limb strengthening/ROM and transition back to bed. Pt requiring two person maximal assist for anterior posterior transfer. Continue to recommend comprehensive inpatient rehab (CIR) for post-acute therapy needs.  Lillia Pauls, PT, DPT Acute Rehabilitation Services Pager [phone] Office [phone]     12/21/19 1459  PT Visit Information  Last PT Received On 12/21/19  Assistance Needed +2  History of Present Illness This 64 y.o. female admitted for bil. BKAs due to bil. gangrene.  PMH includes:  DM type 1, PVD, latent TB, HTN, CAD, CHF, RA, s/p bil. wrist surgery, s/p trigger finger release, s/p Rt shoulder surgery, s/p achilles tendon debridement.    Subjective Data  Patient Stated Goal to get mobile again and to have less pain   Precautions  Precautions Fall;Other (comment)  Precaution Comments x2 wound vacs  Restrictions  Weight Bearing Restrictions Yes  RLE Weight Bearing NWB  LLE Weight Bearing NWB  Pain Assessment  Pain Assessment Faces  Faces Pain Scale 8  Pain Location R residual limb with movement  Pain Descriptors / Indicators Grimacing;Guarding;Burning  Pain Intervention(s) Limited activity within patient's tolerance;Monitored during session;Premedicated before session;Repositioned  Cognition  Arousal/Alertness Awake/alert  Behavior During Therapy Anxious  Overall Cognitive Status Within Functional Limits for tasks assessed  Bed Mobility  Overal bed mobility Needs Assistance  Bed Mobility Sit to Supine  Supine to sit Supervision  General bed mobility comments Long sitting to supine with no physical assist required, HOB elevated  Transfers  Overall transfer level Needs assistance  Equipment used None  Transfers Anterior-Posterior Transfer  Anterior-Posterior transfers Max assist;+2 physical assistance  General transfer comment MaxA + 2 for AP  transfer from bed to chair with use of bed pad to scoot hips forward. Cues for head/hip relationship, hand placement  Ambulation/Gait  General Gait Details unable  Balance  Overall balance assessment Needs assistance  Sitting-balance support Bilateral upper extremity supported  Sitting balance-Leahy Scale Poor  Sitting balance - Comments requires UE support in long sitting in bed   Exercises  Exercises Other exercises;Amputee  Amputee Exercises  Hip Flexion/Marching Both;10 reps;Seated  Knee Extension Both;10 reps;Seated  PT - End of Session  Activity Tolerance Patient tolerated treatment well  Patient left with call bell/phone within reach;with family/visitor present;in chair  Nurse Communication Mobility status   PT - Assessment/Plan  PT Plan Current plan remains appropriate  PT Visit Diagnosis Pain;Other abnormalities of gait and mobility (R26.89)  Pain - Right/Left  (bilateral)  Pain - part of body  (residual limbs)  PT Frequency (ACUTE ONLY) Min 3X/week  Follow Up Recommendations CIR  PT equipment Other (comment) (defer)  AM-PAC PT "6 Clicks" Mobility Outcome Measure (Version 2)  Help needed turning from your back to your side while in a flat bed without using bedrails? 2  Help needed moving from lying on your back to sitting on the side of a flat bed without using bedrails? 1  Help needed moving to and from a bed to a chair (including a wheelchair)? 1  Help needed standing up from a chair using your arms (e.g., wheelchair or bedside chair)? 1  Help needed to walk in hospital room? 1  Help needed climbing 3-5 steps with a railing?  1  6 Click Score 7  Consider Recommendation of Discharge To: CIR/SNF/LTACH  PT Goal Progression  Progress towards PT goals Progressing toward goals  Acute Rehab PT Goals  PT Goal Formulation  With patient/family  Time For Goal Achievement 01/03/20  Potential to Achieve Goals Good  PT Time Calculation  PT Start Time (ACUTE ONLY) 1423  PT  Stop Time (ACUTE ONLY) 1448  PT Time Calculation (min) (ACUTE ONLY) 25 min  PT General Charges  $$ ACUTE PT VISIT 1 Visit  PT Treatments  $Therapeutic Activity 23-37 mins

## 2019-12-21 NOTE — Progress Notes (Signed)
Inpatient Diabetes Program Recommendations  AACE/ADA: New Consensus Statement on Inpatient Glycemic Control (2015)  Target Ranges:  Prepandial:   less than 140 mg/dL      Peak postprandial:   less than 180 mg/dL (1-2 hours)      Critically ill patients:  140 - 180 mg/dL   Lab Results  Component Value Date   GLUCAP 126 (H) 12/21/2019   HGBA1C 6.3 (H) 12/19/2019    Review of Glycemic Control  Insulin pump now back on and running. Will start bolusing at lunchtime meal.  Pt doing well. No questions. Has all supplies.  MD to d/c Novolog. Follow.  Thank you. Ailene Ards, RD, LDN, CDE Inpatient Diabetes Coordinator [phone]

## 2019-12-21 NOTE — TOC Initial Note (Signed)
Transition of Care [facility]) - Initial/Assessment Note    Patient Details  Name: Kaitlin Hudson MRN: [id number] Date of Birth: 25-Dec-1955  Transition of Care [facility]) CM/SW Contact:    Kaitlin Hutching, LCSW Phone Number: 12/21/2019, 1:20 PM  Clinical Narrative:                 CSW spoke with pt at bedside, accompanied by her husband. CSW introduced self, role, reason for visit. 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 Confirmed home address and PCP. Pt and pt husband have had HH and home infusion multiple times. Pt and pt husband both interested in CIR but also understand that we need a back up plan in case a bed does not open at this time.  Pt and pt husband have been speaking with various employees at [facility] and are interested in placement there. CSW explained I would send information through to [facility] and be in touch with their admissions liaison. CSW confirmed pt is unvaccinated (per her report she has bad reactions to "all vaccines," she declines to receive it at this time) her husband has received both of his Pfizer doses. CSW will follow, FL2 done, PASRR received.   Expected Discharge Plan: Skilled Nursing Facility Barriers to Discharge: Continued Medical Work up, SNF Pending bed offer   Patient Goals and CMS Choice Patient states their goals for this hospitalization and ongoing recovery are:: for her to get rehab before going home due to bilateral amputation CMS Medicare.gov Compare Post Acute Care list provided to:: Patient Choice offered to / list presented to : Patient, Spouse  Expected Discharge Plan and Services Expected Discharge Plan: Skilled Nursing Facility In-house Referral: Clinical Social Work Discharge Planning Services: CM Consult Post Acute Care Choice: Skilled Nursing Facility, IP Rehab Living arrangements for the past 2 months: Single Family Home  Prior Living Arrangements/Services Living arrangements for the past 2 months: Single  Family Home Lives with:: Spouse Patient language and need for interpreter reviewed:: Yes (no needs) Do you feel safe going back to the place where you live?: Yes      Need for Family Participation in Patient Care: Yes (Comment) (pt spouse) Care giver support system in place?: Yes (comment) (pt spouse) Current home services: DME Criminal Activity/Legal Involvement Pertinent to Current Situation/Hospitalization: Yes - Comment as needed (assistance w/ daily care)  Activities of Daily Living Home Assistive Devices/Equipment: Environmental consultant (specify type), Wheelchair, CBG Meter ADL Screening (condition at time of admission) Patient's cognitive ability adequate to safely complete daily activities?: Yes Is the patient deaf or have difficulty hearing?: No Does the patient have difficulty seeing, even when wearing glasses/contacts?: No Does the patient have difficulty concentrating, remembering, or making decisions?: No Patient able to express need for assistance with ADLs?: Yes Does the patient have difficulty dressing or bathing?: Yes Independently performs ADLs?: Yes (appropriate for developmental age) Does the patient have difficulty walking or climbing stairs?: Yes Weakness of Legs: Both Weakness of Arms/Hands: Both  Permission Sought/Granted Permission sought to share information with : Family Supports, Oceanographer granted to share information with : Yes, Verbal Permission Granted  Share Information with NAME: Kaitlin Hudson  Permission granted to share info w AGENCY: [facility]  Permission granted to share info w Relationship: spouse  Permission granted to share info w Contact Information: [phone]  Emotional Assessment Appearance:: Appears stated age Attitude/Demeanor/Rapport: Engaged, Gracious Affect (typically observed): Accepting, Adaptable, Appropriate   Alcohol / Substance Use: Not Applicable Psych Involvement:  No (comment)  Admission diagnosis:   Below-knee amputation [facility]) [Q03.474Q] Patient Active Problem List   Diagnosis Date Noted  . Below-knee amputation (HCC) 12/19/2019  . Gangrene of both feet (HCC)   . Heel ulcer, right, with necrosis of muscle (HCC)   . Chronic diastolic heart failure (HCC) 09/12/2013  . Rheumatoid arthritis (HCC) 09/12/2013  . IDDM (insulin dependent diabetes mellitus) 09/09/2013  . Cellulitis of right lower extremity 09/08/2013   PCP:  [name] L. (Inactive) Pharmacy:   [facility] - [place], [place] - [address] [place] [address] Phone: [phone] Fax: [phone]  [facility] [place], [place] - [address] [place] [address] Phone: [phone] Fax: [phone]  Readmission Risk Interventions Readmission Risk Prevention Plan 12/21/2019 08/17/2019  Post Dischage Appt - Complete  Medication Screening - Complete  Transportation Screening Complete Complete  PCP or Specialist Appt within 5-7 Days Not Complete -  Not Complete comments pt planning on CIR vs SNF -  Home Care Screening Complete -  Medication Review (RN CM) Referral to Pharmacy -  Some recent data might be hidden

## 2019-12-21 NOTE — Plan of Care (Signed)
?  Problem: Education: ?Goal: Knowledge of General Education information will improve ?Description: Including pain rating scale, medication(s)/side effects and non-pharmacologic comfort measures ?Outcome: Progressing ?  ?Problem: Clinical Measurements: ?Goal: Ability to maintain clinical measurements within normal limits will improve ?Outcome: Progressing ?  ?Problem: Activity: ?Goal: Risk for activity intolerance will decrease ?Outcome: Progressing ?  ?Problem: Elimination: ?Goal: Will not experience complications related to bowel motility ?Outcome: Progressing ?  ?Problem: Pain Managment: ?Goal: General experience of comfort will improve ?Outcome: Progressing ?  ?

## 2019-12-21 NOTE — Progress Notes (Signed)
PROGRESS NOTE    Kaitlin Hudson  WUJ:[id number] DOB: [birth year] DOA: 12/19/2019 PCP: Earley Abide. (Inactive)    Brief Narrative: 64 year old female with type 1 diabetes since the age of [age], she has been a type I diabetic for 63 years, hypertension hyperlipidemia CAD and rheumatoid arthritis status post elective bilateral BKA after multiple failed limb salvage interventions in the past.  Assessment & Plan:   Active Problems:   Gangrene of both feet (HCC)   Below-knee amputation (HCC)     #1 status post bilateral BKA management per Ortho  #2 type 1 diabetes patient uses insulin pump for the last 17 years.  Restart insulin pump today.  She reports her husband took the pump home and should be bringing it back this morning.  Marland Kitchen Her A1c is 6.3. CBG (last 3)  Recent Labs    12/21/19 0015 12/21/19 0414 12/21/19 0750  GLUCAP 161* 91 126*     #3 history of rheumatoid arthritis on prednisone  #4 history of essential hypertension 8/60.  Continue Lopressor 12.5 twice a day.    #5 history of stage IIIb CKD creatinine 1.09 down from 1.3 stable  #6 history of CAD and chronic diastolic CHF stable now. Cath 11/06/2019 with multiple drug-eluting stents. Recent echo ejection fraction 65 to 70%. She is on PlavixandEliquis and was recommended to continue for 12 months. . On Bumex 2 mg daily.  #7 acute blood loss anemia/postop anemia hemoglobin 8.8 from 7.2 after 1 unit of blood transfusion.   #8 mild hyperkalemia resolved potassium 4.3  Estimated body mass index is 31.75 kg/m as calculated from the following:   Height as of this encounter: 5\' 4"  (1.626 m).   Weight as of this encounter: 83.9 kg.   Subjective: Patient resting in bed she reports her husband took her insulin pump home since it was beeping he is due to bring it back this morning so she can start using it she has no other complaints waiting for CIR to evaluate her  Objective: Vitals:   12/20/19 1512 12/20/19 1529  12/20/19 2027 12/21/19 0421  BP: (!) 128/42 (!) 121/45 (!) 137/51 138/60  Pulse: (!) 106 100 97 91  Resp: 18 18 15 15   Temp: 98.9 F (37.2 C) 98.8 F (37.1 C) 98.2 F (36.8 C) 98.2 F (36.8 C)  TempSrc: Oral Oral Oral Oral  SpO2: 97% 97% 100% 100%  Weight:      Height:        Intake/Output Summary (Last 24 hours) at 12/21/2019 1020 Last data filed at 12/21/2019 0425 Gross per 24 hour  Intake 340 ml  Output 1000 ml  Net -660 ml   Filed Weights   12/17/19 1419 12/19/19 0755  Weight: 83.9 kg 83.9 kg    Examination:  General exam: Appears calm and comfortable  Respiratory system: Clear to auscultation. Respiratory effort normal. Cardiovascular system: S1 & S2 heard, RRR. No JVD, murmurs, rubs, gallops or clicks. No pedal edema. Gastrointestinal system: Abdomen is nondistended, soft and nontender. No organomegaly or masses felt. Normal bowel sounds heard. Central nervous system: Alert and oriented. No focal neurological deficits. Extremities: Bilateral BKA  skin: No rashes, lesions or ulcers Psychiatry: Judgement and insight appear normal. Mood & affect appropriate.     Data Reviewed: I have personally reviewed following labs and imaging studies  CBC: Recent Labs  Lab 12/19/19 0757 12/20/19 0019 12/20/19 2113 12/21/19 0236  WBC 8.6 13.1*  --  10.5  HGB 10.2* 7.2* 8.5* 8.8*  HCT  33.5* 23.3* 26.8* 27.6*  MCV 92.8 91.7  --  89.0  PLT 365 303  --  263   Basic Metabolic Panel: Recent Labs  Lab 12/19/19 0757 12/20/19 0019 12/21/19 0236  NA 140 137 138  K 4.1 5.2* 4.3  CL 104 106 105  CO2 20* 21* 24  GLUCOSE 275* 182* 126*  BUN 30* 28* 21  CREATININE 1.37* 1.25* 1.09*  CALCIUM 9.2 8.3* 8.4*   GFR: Estimated Creatinine Clearance: 53.2 mL/min (A) (by C-G formula based on SCr of 1.09 mg/dL (H)). Liver Function Tests: No results for input(s): AST, ALT, ALKPHOS, BILITOT, PROT, ALBUMIN in the last 168 hours. No results for input(s): LIPASE, AMYLASE in the last  168 hours. No results for input(s): AMMONIA in the last 168 hours. Coagulation Profile: No results for input(s): INR, PROTIME in the last 168 hours. Cardiac Enzymes: No results for input(s): CKTOTAL, CKMB, CKMBINDEX, TROPONINI in the last 168 hours. BNP (last 3 results) No results for input(s): PROBNP in the last 8760 hours. HbA1C: Recent Labs    12/19/19 1709  HGBA1C 6.3*   CBG: Recent Labs  Lab 12/20/19 1613 12/20/19 2023 12/21/19 0015 12/21/19 0414 12/21/19 0750  GLUCAP 253* 229* 161* 91 126*   Lipid Profile: No results for input(s): CHOL, HDL, LDLCALC, TRIG, CHOLHDL, LDLDIRECT in the last 72 hours. Thyroid Function Tests: No results for input(s): TSH, T4TOTAL, FREET4, T3FREE, THYROIDAB in the last 72 hours. Anemia Panel: No results for input(s): VITAMINB12, FOLATE, FERRITIN, TIBC, IRON, RETICCTPCT in the last 72 hours. Sepsis Labs: No results for input(s): PROCALCITON, LATICACIDVEN in the last 168 hours.  Recent Results (from the past 240 hour(s))  SARS CORONAVIRUS 2 (TAT 6-24 HRS) Nasopharyngeal Nasopharyngeal Swab     Status: None   Collection Time: 12/15/19  1:29 PM   Specimen: Nasopharyngeal Swab  Result Value Ref Range Status   SARS Coronavirus 2 NEGATIVE NEGATIVE Final    Comment: (NOTE) SARS-CoV-2 target nucleic acids are NOT DETECTED.  The SARS-CoV-2 RNA is generally detectable in upper and lower respiratory specimens during the acute phase of infection. Negative results do not preclude SARS-CoV-2 infection, do not rule out co-infections with other pathogens, and should not be used as the sole basis for treatment or other patient management decisions. Negative results must be combined with clinical observations, patient history, and epidemiological information. The expected result is Negative.  Fact Sheet for Patients: HairSlick.no  Fact Sheet for Healthcare Providers: quierodirigir.com  This  test is not yet approved or cleared by the [place] FDA and  has been authorized for detection and/or diagnosis of SARS-CoV-2 by FDA under an Emergency Use Authorization (EUA). This EUA will remain  in effect (meaning this test can be used) for the duration of the COVID-19 declaration under Se ction 564(b)(1) of the Act, 21 U.S.C. section 360bbb-3(b)(1), unless the authorization is terminated or revoked sooner.  Performed at [facility], [address] [place], [place] [address]          Radiology Studies: No results found.      Scheduled Meds: . apixaban  5 mg Oral BID  . bumetanide  2 mg Oral Daily  . cholecalciferol  1,000 Units Oral Daily  . clopidogrel  75 mg Oral Daily  . docusate sodium  100 mg Oral BID  . gabapentin  100 mg Oral TID  . hydrOXYzine  25 mg Oral Once  . insulin aspart  0-9 Units Subcutaneous Q4H  . insulin pump  Subcutaneous UD  . metoprolol tartrate  12.5 mg Oral BID  . predniSONE  10 mg Oral Q breakfast   And  . predniSONE  5 mg Oral QPC supper   Continuous Infusions:    LOS: 2 days     Alwyn Ren, MD  12/21/2019, 10:20 AM

## 2019-12-22 LAB — GLUCOSE, CAPILLARY
Glucose-Capillary: 157 mg/dL — ABNORMAL HIGH (ref 70–99)
Glucose-Capillary: 162 mg/dL — ABNORMAL HIGH (ref 70–99)
Glucose-Capillary: 123 mg/dL — ABNORMAL HIGH (ref 70–99)
Glucose-Capillary: 45 mg/dL — ABNORMAL LOW (ref 70–99)
Glucose-Capillary: 90 mg/dL (ref 70–99)
Glucose-Capillary: 240 mg/dL — ABNORMAL HIGH (ref 70–99)
Glucose-Capillary: 213 mg/dL — ABNORMAL HIGH (ref 70–99)

## 2019-12-22 MED ORDER — POLYETHYLENE GLYCOL 3350 17 G PO PACK
17.0000 g | PACK | Freq: Every day | ORAL | Status: DC
  Administered 2019-12-22 – 2019-12-23 (×2): 17 g via ORAL
  Filled 2019-12-22 (×2): qty 1

## 2019-12-22 NOTE — Progress Notes (Addendum)
Inpatient Rehab Admissions Coordinator:   I spoke with Pt. And her husband and confirmed that she would like to come to CIR vs. SNF if a bed becomes available.   Megan Salon, MS, CCC-SLP Rehab Admissions Coordinator  [phone] (celll) [phone] (office)

## 2019-12-22 NOTE — Progress Notes (Signed)
PROGRESS NOTE    Kaitlin Hudson  DTO:[id number] DOB: [birth year] DOA: 12/19/2019 PCP: Earley Abide. (Inactive)    Brief Narrative: 64 year old female with type 1 diabetes since the age of [age], she has been a type I diabetic for 61 years, hypertension hyperlipidemia CAD and rheumatoid arthritis status post elective bilateral BKA after multiple failed limb salvage interventions in the past.  Assessment & Plan:   Active Problems:   Gangrene of both feet (HCC)   Below-knee amputation (HCC)     #1 status post bilateral BKA management per Ortho.  Plan to discharge to CIR tomorrow.  #2 type 1 diabetes patient uses insulin pump for the last 17 years.  Restart insulin pump today.  She reports her husband took the pump home and should be bringing it back this morning.  Marland Kitchen Her A1c is 6.3. CBG (last 3)  Recent Labs    12/22/19 0204 12/22/19 0429 12/22/19 0826  GLUCAP 157* 162* 123*     #3 history of rheumatoid arthritis on prednisone  #4 history of essential hypertension 147/61 Continue Lopressor 12.5 twice a day.    #5 history of stage IIIb CKD creatinine 1.09 down from 1.3 stable  #6 history of CAD and chronic diastolic CHF stable now. Cath 11/06/2019 with multiple drug-eluting stents. Recent echo ejection fraction 65 to 70%. She is on PlavixandEliquis and was recommended to continue for 12 months. . On Bumex 2 mg daily.  #7 acute blood loss anemia/postop anemia hemoglobin 8.8 from 7.2 after 1 unit of blood transfusion.   #8 mild hyperkalemia resolved potassium 4.3  #9 constipation we will start MiraLAX.  Estimated body mass index is 31.75 kg/m as calculated from the following:   Height as of this encounter: 5\' 4"  (1.626 m).   Weight as of this encounter: 83.9 kg.   Subjective: She is resting in bed has been with the bedside she complains of constipation not having a bowel movement since admission.  Objective: Vitals:   12/21/19 0421 12/21/19 1526 12/21/19 2014 12/22/19  0407  BP: 138/60 (!) 136/55 (!) 133/48 (!) 147/61  Pulse: 91 72 96 88  Resp: 15 18 16 17   Temp: 98.2 F (36.8 C) 98.5 F (36.9 C) 98.8 F (37.1 C) 98.6 F (37 C)  TempSrc: Oral Oral Oral Oral  SpO2: 100% 97% 98% 100%  Weight:      Height:        Intake/Output Summary (Last 24 hours) at 12/22/2019 1120 Last data filed at 12/22/2019 0600 Gross per 24 hour  Intake 120 ml  Output 600 ml  Net -480 ml   Filed Weights   12/17/19 1419 12/19/19 0755  Weight: 83.9 kg 83.9 kg    Examination:  General exam: Appears calm and comfortable  Respiratory system: Clear to auscultation. Respiratory effort normal. Cardiovascular system: S1 & S2 heard, RRR. No JVD, murmurs, rubs, gallops or clicks. No pedal edema. Gastrointestinal system: Abdomen is nondistended, soft and nontender. No organomegaly or masses felt. Normal bowel sounds heard. Central nervous system: Alert and oriented. No focal neurological deficits. Extremities: Bilateral BKA  skin: No rashes, lesions or ulcers Psychiatry: Judgement and insight appear normal. Mood & affect appropriate.     Data Reviewed: I have personally reviewed following labs and imaging studies  CBC: Recent Labs  Lab 12/19/19 0757 12/20/19 0019 12/20/19 2113 12/21/19 0236  WBC 8.6 13.1*  --  10.5  HGB 10.2* 7.2* 8.5* 8.8*  HCT 33.5* 23.3* 26.8* 27.6*  MCV 92.8 91.7  --  89.0  PLT 365 303  --  263   Basic Metabolic Panel: Recent Labs  Lab 12/19/19 0757 12/20/19 0019 12/21/19 0236  NA 140 137 138  K 4.1 5.2* 4.3  CL 104 106 105  CO2 20* 21* 24  GLUCOSE 275* 182* 126*  BUN 30* 28* 21  CREATININE 1.37* 1.25* 1.09*  CALCIUM 9.2 8.3* 8.4*   GFR: Estimated Creatinine Clearance: 53.2 mL/min (A) (by C-G formula based on SCr of 1.09 mg/dL (H)). Liver Function Tests: No results for input(s): AST, ALT, ALKPHOS, BILITOT, PROT, ALBUMIN in the last 168 hours. No results for input(s): LIPASE, AMYLASE in the last 168 hours. No results for  input(s): AMMONIA in the last 168 hours. Coagulation Profile: No results for input(s): INR, PROTIME in the last 168 hours. Cardiac Enzymes: No results for input(s): CKTOTAL, CKMB, CKMBINDEX, TROPONINI in the last 168 hours. BNP (last 3 results) No results for input(s): PROBNP in the last 8760 hours. HbA1C: Recent Labs    12/19/19 1709  HGBA1C 6.3*   CBG: Recent Labs  Lab 12/21/19 2021 12/21/19 2353 12/22/19 0204 12/22/19 0429 12/22/19 0826  GLUCAP 148* 68* 157* 162* 123*   Lipid Profile: No results for input(s): CHOL, HDL, LDLCALC, TRIG, CHOLHDL, LDLDIRECT in the last 72 hours. Thyroid Function Tests: No results for input(s): TSH, T4TOTAL, FREET4, T3FREE, THYROIDAB in the last 72 hours. Anemia Panel: No results for input(s): VITAMINB12, FOLATE, FERRITIN, TIBC, IRON, RETICCTPCT in the last 72 hours. Sepsis Labs: No results for input(s): PROCALCITON, LATICACIDVEN in the last 168 hours.  Recent Results (from the past 240 hour(s))  SARS CORONAVIRUS 2 (TAT 6-24 HRS) Nasopharyngeal Nasopharyngeal Swab     Status: None   Collection Time: 12/15/19  1:29 PM   Specimen: Nasopharyngeal Swab  Result Value Ref Range Status   SARS Coronavirus 2 NEGATIVE NEGATIVE Final    Comment: (NOTE) SARS-CoV-2 target nucleic acids are NOT DETECTED.  The SARS-CoV-2 RNA is generally detectable in upper and lower respiratory specimens during the acute phase of infection. Negative results do not preclude SARS-CoV-2 infection, do not rule out co-infections with other pathogens, and should not be used as the sole basis for treatment or other patient management decisions. Negative results must be combined with clinical observations, patient history, and epidemiological information. The expected result is Negative.  Fact Sheet for Patients: HairSlick.no  Fact Sheet for Healthcare Providers: quierodirigir.com  This test is not yet approved or  cleared by the [place] FDA and  has been authorized for detection and/or diagnosis of SARS-CoV-2 by FDA under an Emergency Use Authorization (EUA). This EUA will remain  in effect (meaning this test can be used) for the duration of the COVID-19 declaration under Se ction 564(b)(1) of the Act, 21 U.S.C. section 360bbb-3(b)(1), unless the authorization is terminated or revoked sooner.  Performed at [facility], [address], [place], [place] [address]          Radiology Studies: No results found.      Scheduled Meds: . apixaban  5 mg Oral BID  . bumetanide  2 mg Oral Daily  . cholecalciferol  1,000 Units Oral Daily  . clopidogrel  75 mg Oral Daily  . docusate sodium  100 mg Oral BID  . gabapentin  100 mg Oral TID  . hydrOXYzine  25 mg Oral Once  . insulin pump   Subcutaneous UD  . metoprolol tartrate  12.5 mg Oral BID  . predniSONE  10 mg Oral Q breakfast  And  . predniSONE  5 mg Oral QPC supper   Continuous Infusions:    LOS: 3 days     Alwyn Ren, MD  12/22/2019, 11:20 AM

## 2019-12-23 ENCOUNTER — Inpatient Hospital Stay (HOSPITAL_COMMUNITY)
Admission: RE | Admit: 2019-12-23 | Discharge: 2020-01-05 | DRG: 560 | Disposition: A | Discharge status: Home Health | Payer: Medicare Other | Source: Intra-hospital | Attending: Physical Medicine & Rehabilitation | Admitting: Physical Medicine & Rehabilitation

## 2019-12-23 ENCOUNTER — Encounter (HOSPITAL_COMMUNITY): Payer: Self-pay | Admitting: Physical Medicine & Rehabilitation

## 2019-12-23 ENCOUNTER — Other Ambulatory Visit: Payer: Self-pay

## 2019-12-23 DIAGNOSIS — E1065 Type 1 diabetes mellitus with hyperglycemia: Secondary | ICD-10-CM

## 2019-12-23 DIAGNOSIS — E785 Hyperlipidemia, unspecified: Secondary | ICD-10-CM | POA: Diagnosis present

## 2019-12-23 DIAGNOSIS — E1022 Type 1 diabetes mellitus with diabetic chronic kidney disease: Secondary | ICD-10-CM | POA: Diagnosis present

## 2019-12-23 DIAGNOSIS — I130 Hypertensive heart and chronic kidney disease with heart failure and stage 1 through stage 4 chronic kidney disease, or unspecified chronic kidney disease: Secondary | ICD-10-CM | POA: Diagnosis present

## 2019-12-23 DIAGNOSIS — Z888 Allergy status to other drugs, medicaments and biological substances status: Secondary | ICD-10-CM

## 2019-12-23 DIAGNOSIS — E1042 Type 1 diabetes mellitus with diabetic polyneuropathy: Secondary | ICD-10-CM | POA: Diagnosis present

## 2019-12-23 DIAGNOSIS — R739 Hyperglycemia, unspecified: Secondary | ICD-10-CM | POA: Diagnosis not present

## 2019-12-23 DIAGNOSIS — Z89511 Acquired absence of right leg below knee: Secondary | ICD-10-CM

## 2019-12-23 DIAGNOSIS — Z741 Need for assistance with personal care: Secondary | ICD-10-CM | POA: Diagnosis present

## 2019-12-23 DIAGNOSIS — Z79899 Other long term (current) drug therapy: Secondary | ICD-10-CM

## 2019-12-23 DIAGNOSIS — M069 Rheumatoid arthritis, unspecified: Secondary | ICD-10-CM | POA: Diagnosis present

## 2019-12-23 DIAGNOSIS — G546 Phantom limb syndrome with pain: Secondary | ICD-10-CM | POA: Diagnosis present

## 2019-12-23 DIAGNOSIS — Z880 Allergy status to penicillin: Secondary | ICD-10-CM

## 2019-12-23 DIAGNOSIS — Z86718 Personal history of other venous thrombosis and embolism: Secondary | ICD-10-CM

## 2019-12-23 DIAGNOSIS — I5032 Chronic diastolic (congestive) heart failure: Secondary | ICD-10-CM | POA: Diagnosis present

## 2019-12-23 DIAGNOSIS — L89891 Pressure ulcer of other site, stage 1: Secondary | ICD-10-CM | POA: Diagnosis present

## 2019-12-23 DIAGNOSIS — T402X5A Adverse effect of other opioids, initial encounter: Secondary | ICD-10-CM | POA: Diagnosis not present

## 2019-12-23 DIAGNOSIS — Z9641 Presence of insulin pump (external) (internal): Secondary | ICD-10-CM | POA: Diagnosis present

## 2019-12-23 DIAGNOSIS — D62 Acute posthemorrhagic anemia: Secondary | ICD-10-CM | POA: Diagnosis present

## 2019-12-23 DIAGNOSIS — E1051 Type 1 diabetes mellitus with diabetic peripheral angiopathy without gangrene: Secondary | ICD-10-CM | POA: Diagnosis present

## 2019-12-23 DIAGNOSIS — N1832 Chronic kidney disease, stage 3b: Secondary | ICD-10-CM | POA: Diagnosis present

## 2019-12-23 DIAGNOSIS — Z794 Long term (current) use of insulin: Secondary | ICD-10-CM | POA: Diagnosis not present

## 2019-12-23 DIAGNOSIS — R000 Tachycardia, unspecified: Secondary | ICD-10-CM

## 2019-12-23 DIAGNOSIS — R152 Fecal urgency: Secondary | ICD-10-CM | POA: Diagnosis present

## 2019-12-23 DIAGNOSIS — I2510 Atherosclerotic heart disease of native coronary artery without angina pectoris: Secondary | ICD-10-CM | POA: Diagnosis present

## 2019-12-23 DIAGNOSIS — Z7902 Long term (current) use of antithrombotics/antiplatelets: Secondary | ICD-10-CM

## 2019-12-23 DIAGNOSIS — K5900 Constipation, unspecified: Secondary | ICD-10-CM | POA: Diagnosis present

## 2019-12-23 DIAGNOSIS — Z7901 Long term (current) use of anticoagulants: Secondary | ICD-10-CM | POA: Diagnosis not present

## 2019-12-23 DIAGNOSIS — Z89512 Acquired absence of left leg below knee: Secondary | ICD-10-CM | POA: Diagnosis not present

## 2019-12-23 DIAGNOSIS — Z91040 Latex allergy status: Secondary | ICD-10-CM

## 2019-12-23 DIAGNOSIS — Y92239 Unspecified place in hospital as the place of occurrence of the external cause: Secondary | ICD-10-CM | POA: Diagnosis not present

## 2019-12-23 DIAGNOSIS — R3915 Urgency of urination: Secondary | ICD-10-CM | POA: Diagnosis present

## 2019-12-23 DIAGNOSIS — Z7952 Long term (current) use of systemic steroids: Secondary | ICD-10-CM | POA: Diagnosis not present

## 2019-12-23 DIAGNOSIS — I10 Essential (primary) hypertension: Secondary | ICD-10-CM | POA: Diagnosis not present

## 2019-12-23 DIAGNOSIS — E669 Obesity, unspecified: Secondary | ICD-10-CM | POA: Diagnosis present

## 2019-12-23 DIAGNOSIS — Z4781 Encounter for orthopedic aftercare following surgical amputation: Secondary | ICD-10-CM | POA: Diagnosis present

## 2019-12-23 DIAGNOSIS — Z881 Allergy status to other antibiotic agents status: Secondary | ICD-10-CM

## 2019-12-23 DIAGNOSIS — E10649 Type 1 diabetes mellitus with hypoglycemia without coma: Secondary | ICD-10-CM | POA: Diagnosis not present

## 2019-12-23 DIAGNOSIS — Z885 Allergy status to narcotic agent status: Secondary | ICD-10-CM

## 2019-12-23 DIAGNOSIS — G8918 Other acute postprocedural pain: Secondary | ICD-10-CM

## 2019-12-23 DIAGNOSIS — F419 Anxiety disorder, unspecified: Secondary | ICD-10-CM | POA: Diagnosis present

## 2019-12-23 DIAGNOSIS — Z6831 Body mass index (BMI) 31.0-31.9, adult: Secondary | ICD-10-CM

## 2019-12-23 DIAGNOSIS — Z882 Allergy status to sulfonamides status: Secondary | ICD-10-CM

## 2019-12-23 DIAGNOSIS — Z886 Allergy status to analgesic agent status: Secondary | ICD-10-CM

## 2019-12-23 DIAGNOSIS — L299 Pruritus, unspecified: Secondary | ICD-10-CM | POA: Diagnosis not present

## 2019-12-23 DIAGNOSIS — Z5329 Procedure and treatment not carried out because of patient's decision for other reasons: Secondary | ICD-10-CM | POA: Diagnosis not present

## 2019-12-23 DIAGNOSIS — I739 Peripheral vascular disease, unspecified: Secondary | ICD-10-CM | POA: Diagnosis not present

## 2019-12-23 LAB — GLUCOSE, CAPILLARY
Glucose-Capillary: 154 mg/dL — ABNORMAL HIGH (ref 70–99)
Glucose-Capillary: 214 mg/dL — ABNORMAL HIGH (ref 70–99)
Glucose-Capillary: 74 mg/dL (ref 70–99)
Glucose-Capillary: 88 mg/dL (ref 70–99)

## 2019-12-23 MED ORDER — DULOXETINE HCL 30 MG PO CPEP
30.0000 mg | ORAL_CAPSULE | Freq: Every day | ORAL | Status: AC
  Filled 2019-12-23 (×3): qty 1

## 2019-12-23 MED ORDER — ONDANSETRON HCL 4 MG PO TABS: 4.0000 mg | ORAL_TABLET | Freq: Four times a day (QID) | ORAL | Status: DC | PRN

## 2019-12-23 MED ORDER — METOPROLOL TARTRATE 12.5 MG HALF TABLET
12.5000 mg | ORAL_TABLET | Freq: Two times a day (BID) | ORAL | Status: DC
  Administered 2019-12-23 – 2020-01-05 (×26): 12.5 mg via ORAL
  Filled 2019-12-23 (×26): qty 1

## 2019-12-23 MED ORDER — BUMETANIDE 2 MG PO TABS
2.0000 mg | ORAL_TABLET | Freq: Every day | ORAL | Status: DC
  Administered 2019-12-24 – 2019-12-25 (×2): 2 mg via ORAL
  Filled 2019-12-23 (×14): qty 1

## 2019-12-23 MED ORDER — OXYCODONE HCL 5 MG PO TABS
5.0000 mg | ORAL_TABLET | ORAL | Status: DC | PRN
  Administered 2019-12-23: 5 mg via ORAL
  Administered 2019-12-24 – 2019-12-27 (×7): 10 mg via ORAL
  Administered 2019-12-27 – 2019-12-29 (×3): 5 mg via ORAL
  Administered 2019-12-31: 10 mg via ORAL
  Administered 2020-01-01: 5 mg via ORAL
  Administered 2020-01-02: 10 mg via ORAL
  Filled 2019-12-23: qty 2
  Filled 2019-12-23: qty 1
  Filled 2019-12-23: qty 2
  Filled 2019-12-23: qty 1
  Filled 2019-12-23 (×3): qty 2
  Filled 2019-12-23: qty 1
  Filled 2019-12-23 (×8): qty 2

## 2019-12-23 MED ORDER — DULOXETINE HCL 60 MG PO CPEP
60.0000 mg | ORAL_CAPSULE | Freq: Every day | ORAL | Status: DC
  Administered 2019-12-29 – 2019-12-31 (×3): 60 mg via ORAL
  Filled 2019-12-23 (×5): qty 1

## 2019-12-23 MED ORDER — APIXABAN 5 MG PO TABS
5.0000 mg | ORAL_TABLET | Freq: Two times a day (BID) | ORAL | Status: DC
  Administered 2019-12-23 – 2020-01-05 (×26): 5 mg via ORAL
  Filled 2019-12-23 (×26): qty 1

## 2019-12-23 MED ORDER — INSULIN PUMP: SUBCUTANEOUS | Status: DC

## 2019-12-23 MED ORDER — ONDANSETRON HCL 4 MG/2ML IJ SOLN: 4.0000 mg | Freq: Four times a day (QID) | INTRAMUSCULAR | Status: DC | PRN

## 2019-12-23 MED ORDER — BISACODYL 10 MG RE SUPP
10.0000 mg | Freq: Once | RECTAL | Status: DC
  Filled 2019-12-23: qty 1

## 2019-12-23 MED ORDER — VITAMIN D3 25 MCG (1000 UNIT) PO TABS
1000.0000 [IU] | ORAL_TABLET | Freq: Every day | ORAL | Status: DC
  Administered 2019-12-24 – 2020-01-05 (×13): 1000 [IU] via ORAL
  Filled 2019-12-23 (×26): qty 1

## 2019-12-23 MED ORDER — BISACODYL 5 MG PO TBEC
10.0000 mg | DELAYED_RELEASE_TABLET | Freq: Every day | ORAL | Status: AC
  Administered 2019-12-24: 10 mg via ORAL
  Filled 2019-12-23 (×3): qty 2

## 2019-12-23 MED ORDER — SENNA 8.6 MG PO TABS
1.0000 | ORAL_TABLET | Freq: Every day | ORAL | Status: DC
  Administered 2019-12-23 – 2020-01-02 (×4): 8.6 mg via ORAL
  Filled 2019-12-23 (×13): qty 1

## 2019-12-23 MED ORDER — CLOPIDOGREL BISULFATE 75 MG PO TABS
75.0000 mg | ORAL_TABLET | Freq: Every day | ORAL | Status: DC
  Administered 2019-12-24 – 2020-01-05 (×13): 75 mg via ORAL
  Filled 2019-12-23 (×13): qty 1

## 2019-12-23 MED ORDER — PREDNISONE 5 MG PO TABS
5.0000 mg | ORAL_TABLET | Freq: Every day | ORAL | Status: DC
  Administered 2019-12-23 – 2020-01-04 (×13): 5 mg via ORAL
  Filled 2019-12-23 (×13): qty 1

## 2019-12-23 MED ORDER — PREDNISONE 5 MG PO TABS
10.0000 mg | ORAL_TABLET | Freq: Every day | ORAL | Status: DC
  Administered 2019-12-24 – 2020-01-05 (×13): 10 mg via ORAL
  Filled 2019-12-23 (×13): qty 2

## 2019-12-23 MED ORDER — DOCUSATE SODIUM 100 MG PO CAPS
100.0000 mg | ORAL_CAPSULE | Freq: Two times a day (BID) | ORAL | Status: DC
  Administered 2019-12-23 – 2020-01-05 (×21): 100 mg via ORAL
  Filled 2019-12-23 (×25): qty 1

## 2019-12-23 MED ORDER — ACETAMINOPHEN 325 MG PO TABS
325.0000 mg | ORAL_TABLET | Freq: Four times a day (QID) | ORAL | Status: DC | PRN
  Administered 2019-12-24 – 2019-12-27 (×6): 650 mg via ORAL
  Filled 2019-12-23 (×7): qty 2

## 2019-12-23 NOTE — Progress Notes (Signed)
Report given to RN on 4W

## 2019-12-23 NOTE — Discharge Summary (Signed)
Discharge Diagnoses:  Active Problems:   Gangrene of both feet (HCC)   Below-knee amputation (HCC)   Surgeries: Procedure(s): BILATERAL BELOW KNEE AMPUTATION on 12/19/2019    Consultants:   Discharged Condition: Improved  Hospital Course: Kaitlin Hudson is an 64 y.o. female who was admitted 12/19/2019 with a chief complaint of gangrene bilateral feet, with a final diagnosis of Gangrene Bilateral Feet.  Patient was brought to the operating room on 12/19/2019 and underwent Procedure(s): BILATERAL BELOW KNEE AMPUTATION.    Patient was given perioperative antibiotics:  Anti-infectives (From admission, onward)   Start     Dose/Rate Route Frequency Ordered Stop   12/19/19 1600  clindamycin (CLEOCIN) IVPB 600 mg        600 mg 100 mL/hr over 30 Minutes Intravenous Every 6 hours 12/19/19 1534 12/20/19 0435   12/19/19 0800  clindamycin (CLEOCIN) IVPB 900 mg        900 mg 100 mL/hr over 30 Minutes Intravenous On call to O.R. 12/19/19 0751 12/19/19 1017    .  Patient was given sequential compression devices, early ambulation, and aspirin for DVT prophylaxis.  Recent vital signs:  Patient Vitals for the past 24 hrs:  BP Temp Temp src Pulse Resp SpO2  12/23/19 0451 (!) 158/68 98.2 F (36.8 C) Oral 87 17 97 %  12/22/19 2029 (!) 139/49 98.4 F (36.9 C) Oral 86 17 97 %  12/22/19 1500 (!) 133/46 99.1 F (37.3 C) Oral 91 18 100 %  .  Recent laboratory studies: No results found.  Discharge Medications:   Allergies as of 12/23/2019      Reactions   Aspirin Other (See Comments)   Nose bleed   Latex Rash   Other Rash   VINYL   Cimzia [certolizumab Pegol] Other (See Comments)   bleeding   Clindamycin/lincomycin Diarrhea   Diclofenac Nausea Only   Enbrel [etanercept] Hives   Erythromycin Other (See Comments)   Severe stomach cramping   Lasix [furosemide] Itching   Leflunomide Other (See Comments)   Fatigue/anxiety   Rynatan [chlorpheniramine-phenyleph Er] Other (See Comments)    Stomach ache    Statins Other (See Comments)   Myalgias.   Tramadol Nausea And Vomiting   Chlorhexidine Rash   CHG wipes   Keflet [cephalexin] Rash   Severe stomach cramping   Penicillins Rash   Pneumococcal Polysaccharide Vaccine Rash   All over body   Sulfa Antibiotics Rash      Medication List    TAKE these medications   acetaminophen 500 MG tablet Commonly known as: TYLENOL Take 2 tablets (1,000 mg total) by mouth every 8 (eight) hours as needed for moderate pain.   bisacodyl 10 MG suppository Commonly known as: DULCOLAX Place 1 suppository (10 mg total) rectally daily as needed for moderate constipation.   bumetanide 2 MG tablet Commonly known as: BUMEX Take 2 mg by mouth daily.   cholecalciferol 25 MCG (1000 UNIT) tablet Commonly known as: VITAMIN D Take 1,000 Units by mouth daily.   clopidogrel 75 MG tablet Commonly known as: PLAVIX Take 75 mg by mouth daily.   docusate sodium 100 MG capsule Commonly known as: COLACE Take 1 capsule (100 mg total) by mouth 2 (two) times daily.   Eliquis 5 MG Tabs tablet Generic drug: apixaban Take 5 mg by mouth 2 (two) times daily.   HumaLOG 100 UNIT/ML injection Generic drug: insulin lispro as directed. Via Insulin Pump   insulin pump Soln Inject into the skin as directed. With humalog insulin  INSULIN SYRINGE .3CC/31GX5/16" 31G X 5/16" 0.3 ML Misc Use 1 time daily   metoprolol tartrate 25 MG tablet Commonly known as: LOPRESSOR Take 12.5 mg by mouth 2 (two) times daily.   OneTouch Ultra test strip Generic drug: glucose blood Use 6 times daily. E11.65 on insulin pump   oxyCODONE 5 MG immediate release tablet Commonly known as: Oxy IR/ROXICODONE Take 1-2 tablets (5-10 mg total) by mouth every 4 (four) hours as needed for moderate pain (pain score 4-6). What changed: when to take this   polyethylene glycol 17 g packet Commonly known as: MIRALAX / GLYCOLAX Take 17 g by mouth daily as needed for moderate  constipation.   predniSONE 5 MG tablet Commonly known as: DELTASONE Take 5-10 mg by mouth See admin instructions. Take 10 mg by mouth in the morning & take 5 mg by mouth at night.       Diagnostic Studies: No results found.  Patient benefited maximally from their hospital stay and there were no complications.     Disposition: Discharge disposition: 02-Transferred to [facility]      Discharge Instructions    Call MD / Call 911   Complete by: As directed    If you experience chest pain or shortness of breath, CALL 911 and be transported to the hospital emergency room.  If you develope a fever above 101 F, pus (white drainage) or increased drainage or redness at the wound, or calf pain, call your surgeon's office.   Constipation Prevention   Complete by: As directed    Drink plenty of fluids.  Prune juice may be helpful.  You may use a stool softener, such as Colace (over the counter) 100 mg twice a day.  Use MiraLax (over the counter) for constipation as needed.   Diet - low sodium heart healthy   Complete by: As directed    Increase activity slowly as tolerated   Complete by: As directed    Negative Pressure Wound Therapy - Incisional   Complete by: As directed    Show patient how to attach prevena pumps. Facility should call office if pumps beep or stop working      Follow-up Information    Barnie Del R, NP In 1 week.   Specialty: Orthopedic Surgery Contact information: [address] [place] [address] [phone]                Signed: Nadara Mustard 12/23/2019, 8:34 AM

## 2019-12-23 NOTE — Progress Notes (Signed)
Inpatient Rehab Admissions Coordinator:   I have a CIR bed for this patient today. I have obtained necessary consent from Pt.  and will plan to admit today.  Megan Salon, MS, CCC-SLP Rehab Admissions Coordinator  [phone] (celll) [phone] (office)

## 2019-12-23 NOTE — Progress Notes (Signed)
Patient ID: Kaitlin Hudson, female   DOB: 1955/12/18, 64 y.o.   MRN: [id number] Bed available in CIR today, orders written to discharge to CIR

## 2019-12-23 NOTE — Care Management (Signed)
0900 12-23-19 Case Manager reached out to Megan Salon Admissions Rehab Coordinator, plan is to accept patient for rehab today. Gala Lewandowsky, RN,BSN Case Manager

## 2019-12-23 NOTE — Progress Notes (Addendum)
Inpatient Rehabilitation Medication Review by a Pharmacist  A complete drug regimen review was completed for this patient to identify any potential clinically significant medication issues.  Clinically significant medication issues were identified:  no   Type of Medication Issue Identified Description of Issue Urgent (address now) Non-Urgent (address on AM team rounds) Plan Plan Accepted by Provider? (Yes / No / Pending AM Rounds)  Drug Interaction(s) (clinically significant)       Duplicate Therapy       Allergy       No Medication Administration End Date       Incorrect Dose       Additional Drug Therapy Needed  Miralax on discharge med list, not re-ordered on transfer to rehab Non-urgent Follow up in AM rounds Pending AM rounds  Other          For non-urgent medication issues to be resolved on team rounds tomorrow morning a [facility] Secure Chat Handoff was sent to: will follow up in AM rounds   Pharmacist comments: NA  Time spent performing this drug regimen review (minutes):  15  Laverna Peace, PharmD PGY-1 Pharmacy Resident 12/23/2019 3:34 PM Please see AMION for all pharmacy numbers

## 2019-12-23 NOTE — Progress Notes (Signed)
PMR Admission Coordinator Pre-Admission Assessment  Patient: Kaitlin Hudson is an 64 y.o., female MRN: [id number] DOB: 1955-05-27 Height: 5' 4"  (162.6 cm) Weight: 83.9 kg  Insurance Information HMO:     PPO:      PCP:      IPA:      80/20:      OTHER:  PRIMARY: Medicare a and b      Policy#: [id number]      Subscriber: pt Benefits:  Phone #: passport one online     Name: 10/8 Eff. Date: a 09/13/2018 and b 03/16/2019     Deduct: $1484      Out of Pocket Max: none      Life Max: none CIR: 100%      SNF: 20 full days Outpatient: 805     Co-Pay: 20% Home Health: 100%      Co-Pay: none DME: 80%     Co-Pay: 20% Providers: pt choice  SECONDARY: BCBS supplement      Policy#: [id number]  The "Data Collection Information Summary" for patients in Inpatient Rehabilitation Facilities with attached "Privacy Act [place] Records" was provided and verbally reviewed with: Patient and Family  Emergency Contact Information         Contact Information    Name Relation Home Work Mobile   Phillips Spouse [phone]  [phone]      Current Medical History  Patient Admitting Diagnosis: Bilateral BKA  History of Present Illness: 64 year old right-handed female history ofRA with chronic prednisone,diastolic congestive heart failure, [facility] post cardiac cath August 2021 underwent rotational atherectomy assisted multivessel PCI maintained on Plavix and Eliquis x 12 months,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 Per chart review she lives with spouse. 1 level home with ramped entrance. Modified independent with rolling walker prior to admission. Presented 12/19/2019 with bilateral ischemic ulcers and lower extremity rest pain. She is status post debridement and skin graft for right Achilles ulcer 4 months out from surgery. Patient with progressive  gangrenous necrotic changes follow-up with orthopedic services limbs not felt to be salvageable and underwent bilateral transtibial amputation 12/19/2019 per Dr. Sharol Given. Wound VACS as directed. Acute blood loss anemia 8.8.   Patient's medical record from [facility] has been reviewed by the rehabilitation admission coordinator and physician.  Past Medical History      Past Medical History:  Diagnosis Date  . Arthritis, rheumatoid ([place])   . CHF (congestive heart failure) ([place])    pt denies this dx  . Coronary artery disease   . Diabetes mellitus without complication ([place])   . Headache   . Hyperlipidemia   . Hypertension   . Latent tuberculosis    pt denies this dx  . Peripheral vascular disease ([place])    blood clot in left leg  . Type 1 diabetes ([place])     Family History   family history is not on file.  Prior Rehab/Hospitalizations Has the patient had prior rehab or hospitalizations prior to admission? Yes  Has the patient had major surgery during 100 days prior to admission? Yes             Current Medications  Current Facility-Administered Medications:  .  acetaminophen (TYLENOL) tablet 325-650 mg, 325-650 mg, Oral, Q6H PRN, Persons, Bevely Palmer, PA .  apixaban (ELIQUIS) tablet 5 mg, 5 mg, Oral, BID, Persons, Bevely Palmer, PA, 5 mg at 12/21/19 1109 .  bumetanide (BUMEX) tablet 2 mg, 2  mg, Oral, Daily, Persons, Bevely Palmer, [place], 2 mg at 12/20/19 1001 .  cholecalciferol (VITAMIN D) tablet 1,000 Units, 1,000 Units, Oral, Daily, Persons, Bevely Palmer, [place], 1,000 Units at 12/21/19 1109 .  clopidogrel (PLAVIX) tablet 75 mg, 75 mg, Oral, Daily, Persons, Bevely Palmer, [place], 75 mg at 12/21/19 1109 .  docusate sodium (COLACE) capsule 100 mg, 100 mg, Oral, BID, Persons, Bevely Palmer, PA, 100 mg at 12/21/19 1108 .  gabapentin (NEURONTIN) capsule 100 mg, 100 mg, Oral, TID, Persons, Bevely Palmer, [place] .  HYDROmorphone (DILAUDID) injection 0.5 mg, 0.5 mg, Intravenous, Q4H PRN, Persons,  Bevely Palmer, PA, 0.5 mg at 12/21/19 0850 .  hydrOXYzine (ATARAX/VISTARIL) tablet 25 mg, 25 mg, Oral, Once, Blount, Xenia T, NP .  insulin pump, , Subcutaneous, UD, Persons, Bevely Palmer, [place] .  metoCLOPramide (REGLAN) tablet 5-10 mg, 5-10 mg, Oral, Q8H PRN **OR** metoCLOPramide (REGLAN) injection 5-10 mg, 5-10 mg, Intravenous, Q8H PRN, Persons, Bevely Palmer, PA .  metoprolol tartrate (LOPRESSOR) tablet 12.5 mg, 12.5 mg, Oral, BID, Persons, Bevely Palmer, PA, 12.5 mg at 12/21/19 1108 .  ondansetron (ZOFRAN) tablet 4 mg, 4 mg, Oral, Q6H PRN **OR** ondansetron (ZOFRAN) injection 4 mg, 4 mg, Intravenous, Q6H PRN, Persons, Bevely Palmer, PA .  oxyCODONE (Oxy IR/ROXICODONE) immediate release tablet 5-10 mg, 5-10 mg, Oral, Q4H PRN, Persons, Bevely Palmer, PA, 10 mg at 12/21/19 1317 .  predniSONE (DELTASONE) tablet 10 mg, 10 mg, Oral, Q breakfast, 10 mg at 12/21/19 0850 **AND** predniSONE (DELTASONE) tablet 5 mg, 5 mg, Oral, QPC supper, Newt Minion, MD, 5 mg at 12/20/19 1801  Patients Current Diet:     Diet Order                  Diet Carb Modified Fluid consistency: Thin; Room service appropriate? Yes  Diet effective now                  Precautions / Restrictions Precautions Precautions: Fall Restrictions Weight Bearing Restrictions: Yes RLE Weight Bearing: Non weight bearing LLE Weight Bearing: Non weight bearing   Has the patient had 2 or more falls or a fall with injury in the past year? No  Prior Activity Level Limited Community (1-2x/wk): needed assistance since June from spouse  Prior Functional Level Self Care: Did the patient need help bathing, dressing, using the toilet or eating? Needed some help  Indoor Mobility: Did the patient need assistance with walking from room to room (with or without device)? Needed some help  Stairs: Did the patient need assistance with internal or external stairs (with or without device)? Needed some help  Functional Cognition: Did the patient  need help planning regular tasks such as shopping or remembering to take medications? Independent  Home Assistive Devices / Equipment Home Assistive Devices/Equipment: Environmental consultant (specify type), Wheelchair, CBG Meter Home Equipment: Walker - 2 wheels, Bedside commode, Wheelchair - manual, Wheelchair - power, Tub bench  Prior Device Use: Indicate devices/aids used by the patient prior to current illness, exacerbation or injury? Manual wheelchair and Walker  Current Functional Level Cognition  Overall Cognitive Status: Within Functional Limits for tasks assessed Orientation Level: Oriented X4    Extremity Assessment (includes Sensation/Coordination)  Upper Extremity Assessment: Generalized weakness (h/o RA)  Lower Extremity Assessment: RLE deficits/detail, LLE deficits/detail RLE Deficits / Details: s/p BKA. Able to reach neutral knee extension actively, perform SLR LLE Deficits / Details: s/p BKA. Able to reach neutral knee extension actively, perform SLR    ADLs  Overall ADL's :  Needs assistance/impaired Eating/Feeding: Independent Grooming: Wash/dry hands, Wash/dry face, Oral care, Brushing hair, Set up, Bed level Upper Body Bathing: Set up, Bed level Lower Body Bathing: Moderate assistance, Bed level Upper Body Dressing : Moderate assistance, Bed level Lower Body Dressing: Maximal assistance, Bed level Toilet Transfer: Total assistance Toileting- Clothing Manipulation and Hygiene: Total assistance, Bed level General ADL Comments: Pt currently limited by pain     Mobility  Overal bed mobility: Needs Assistance Bed Mobility: Supine to Sit Supine to sit: Supervision General bed mobility comments: Supine to long sit with use of bed rail x 5 without physical assist    Transfers  General transfer comment: Pt unable to tolerate this date     Ambulation / Gait / Stairs / Office manager / Balance Dynamic Sitting Balance Sitting balance - Comments:  requires UE support in long sitting in bed  Balance Overall balance assessment: Needs assistance Sitting-balance support: Bilateral upper extremity supported Sitting balance-Leahy Scale: Poor Sitting balance - Comments: requires UE support in long sitting in bed     Special needs/care consideration visitor is spouse , Wayne Bilateral LE wound VACS to surgical sites   Previous Home Environment  Living Arrangements: Spouse/significant other  Lives With: Spouse Available Help at Discharge: Family, Available 24 hours/day Type of Home: House Home Layout: One level Home Access: Ramped entrance Bathroom Shower/Tub: Multimedia programmer: Associate Professor Accessibility: Yes Home Care Services: No Additional Comments: spouse very supportive and has been assisting with medical care   Discharge Living Setting Plans for Discharge Living Setting: Patient's home, Lives with (comment) (spouse) Type of Home at Discharge: House Discharge Home Layout: One level Discharge Home Access: [place] entrance Discharge Bathroom Shower/Tub: Walk-in shower Discharge Bathroom Toilet: Standard Discharge Bathroom Accessibility: Yes How Accessible: Accessible via walker Does the patient have any problems obtaining your medications?: No  Social/Family/Support Systems Patient Roles: Spouse Contact Information: spouse, Management consultant Anticipated Caregiver: spouse Anticipated Ambulance person Information: see above Ability/Limitations of Caregiver: none Caregiver Availability: 24/7 Discharge Plan Discussed with Primary Caregiver: Yes Is Caregiver In Agreement with Plan?: Yes Does Caregiver/Family have Issues with Lodging/Transportation while Pt is in Rehab?: No  Goals Patient/Family Goal for Rehab: supervision to min assist with PT and OT at wheelchair level Expected length of stay: ELOS 2 weeks Pt/Family Agrees to Admission and willing to participate: Yes Program Orientation Provided & Reviewed  with Pt/Caregiver Including Roles  & Responsibilities: Yes  Decrease burden of Care through IP rehab admission: n/a  Possible need for SNF placement upon discharge: not anticipated  Patient Condition: I have reviewed medical records from [facility] , spoken with CSW, and patient and spouse. I met with patient at the bedside for inpatient rehabilitation assessment.  Patient will benefit from ongoing PT and OT, can actively participate in 3 hours of therapy a day 5 days of the week, and can make measurable gains during the admission.  Patient will also benefit from the coordinated team approach during an Inpatient Acute Rehabilitation admission.  The patient will receive intensive therapy as well as Rehabilitation physician, nursing, social worker, and care management interventions.  Due to bladder management, bowel management, safety, skin/wound care, disease management, medication administration, pain management and patient education the patient requires 24 hour a day rehabilitation nursing.  The patient is currently mod to max assist with mobility and basic ADLs.  Discharge setting and therapy post discharge at home with home health is anticipated.  Patient has  agreed to participate in the Acute Inpatient Rehabilitation Program and will admit today.  Preadmission Screen Completed By:  Cleatrice Burke, 12/21/2019 1:30 PM with updates by Clemens Catholic. ______________________________________________________________________   Discussed status with Dr. Dagoberto Ligas on 12:00 at 12/22/2019 and received approval for admission today.  Admission Coordinator:  Cleatrice Burke, RN, time 8:13 Sudie Grumbling 12/23/19 with updates by Clemens Catholic  Assessment/Plan: Diagnosis: 1. Does the need for close, 24 hr/day Medical supervision in concert with the patient's rehab needs make it unreasonable for this patient to be served in a less intensive setting? Yes 2. Co-Morbidities requiring  supervision/potential complications: DM, insulin pump prior, B/L BKA's, dCHF, CAD, CKD II-III, RA on chronic prednisone, HTN, HLD  3. Due to bladder management, bowel management, safety, skin/wound care, disease management, medication administration, pain management and patient education, does the patient require 24 hr/day rehab nursing? Yes 4. Does the patient require coordinated care of a physician, rehab nurse, PT, OT, and SLP to address physical and functional deficits in the context of the above medical diagnosis(es)? Yes Addressing deficits in the following areas: balance, endurance, strength, transferring, bowel/bladder control, bathing, dressing, feeding, grooming and toileting 5. Can the patient actively participate in an intensive therapy program of at least 3 hrs of therapy 5 days a week? Yes 6. The potential for patient to make measurable gains while on inpatient rehab is good 7. Anticipated functional outcomes upon discharge from inpatient rehab: supervision and min assist PT, supervision and min assist OT, n/a SLP 8. Estimated rehab length of stay to reach the above functional goals is: 2 weeks on average 9. Anticipated discharge destination: Home 10. Overall Rehab/Functional Prognosis: good   MD Signature:

## 2019-12-23 NOTE — H&P (Signed)
Physical Medicine and Rehabilitation Admission H&P     HPI: Kaitlin Hudson is a 64 year old right-handed female history of RA with chronic prednisone, diastolic congestive heart failure, CAD followed at [facility] status post cardiac cath August 2021 underwent rotational atherectomy assisted multivessel PCI maintained on Plavix and Eliquis x12 months, CKD stage II with creatinine 1.37, hypertension, hyperlipidemia, diabetes mellitus with insulin pump and peripheral neuropathy.  Per chart review she lives with spouse.  1 level home with ramped entrance.  Modified independent with rolling walker prior to admission.  Presented 12/19/2019 with bilateral ischemic ulcers and lower extremity rest pain.  She is status post debridement and skin graft for right Achilles ulcer 4 months out from surgery.  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 Lajoyce Corners.  Wound vacs as directed.  Acute blood loss anemia 8.8.  Therapy evaluations completed and patient was admitted for a comprehensive rehab program. Pt reports first BM since admission was today- also has B/B urgency- when needs to go, HAS to go immediately- has bedpan next to her in bed.  Oxy helps pain, but hates ot takes because constipation.  Also having a lot of phantom pain.    Review of Systems  Constitutional: Negative for chills and fever.  HENT: Negative for hearing loss.   Eyes: Negative for blurred vision and double vision.  Respiratory: Negative for cough and shortness of breath.   Cardiovascular: Positive for palpitations and leg swelling.  Gastrointestinal: Positive for constipation. Negative for heartburn, nausea and vomiting.  Genitourinary: Negative for dysuria, flank pain and hematuria.  Musculoskeletal: Positive for joint pain and myalgias.  Neurological: Positive for headaches.  All other  systems reviewed and are negative.  Past Medical History:  Diagnosis Date  . Arthritis, rheumatoid (HCC)   . CHF (congestive heart failure) (HCC)    pt denies this dx  . Coronary artery disease   . Diabetes mellitus without complication (H[facility])   . Headache   . Hyperlipidemia   . Hypertension   . Latent tuberculosis    pt denies this dx  . Peripheral vascular disease (H[facility])    blood clot in left leg  . Type 1 diabetes [facility])    Past Surgical History:  Procedure Laterality Date  . AMPUTATION Bilateral 12/19/2019   Procedure: BILATERAL BELOW KNEE AMPUTATION;  Surgeon: Nadara Mustard, MD;  Location: [facility] OR;  Service: Orthopedics;  Laterality: Bilateral;  . CARDIAC CATHETERIZATION  11/02/2019  . CARPAL TUNNEL RELEASE Bilateral   . EYE SURGERY Bilateral    09/2018  . FINGER SURGERY  07/11/2014   right middle finger - nodule removed  . FINGER SURGERY  02/20/2017   right middle finger  . FOOT SURGERY Right 02/26/2014   screw  . I & D EXTREMITY Right 08/15/2019   Procedure: RIGHT ACHILLES DEBRIDEMENT, POSSIBLE SKIN GRAFT;  Surgeon: Nadara Mustard, MD;  Location: [facility] OR;  Service: Orthopedics;  Laterality: Right;  . JOINT REPLACEMENT    . SHOULDER SURGERY Right 01/06/2005  . TRIGGER FINGER RELEASE  12/202002   left hand -   . WRIST SURGERY Left 10/23/2004   and middle finger  . WRIST SURGERY Right 11/05/2005  . WRIST SURGERY Right 03/11/2006   cyst removed   History reviewed. No pertinent family history. Social History:  reports that she has never smoked. She has never used smokeless tobacco. She reports that she does  not drink alcohol and does not use drugs. Allergies:  Allergies  Allergen Reactions  . Aspirin Other (See Comments)    Nose bleed  . Latex Rash  . Other Rash    VINYL   . Cimzia [Certolizumab Pegol] Other (See Comments)    bleeding  . Clindamycin/Lincomycin Diarrhea  . Diclofenac Nausea Only  . Enbrel [Etanercept] Hives  . Erythromycin Other (See Comments)     Severe stomach cramping  . Lasix [Furosemide] Itching  . Leflunomide Other (See Comments)    Fatigue/anxiety  . Rynatan [Chlorpheniramine-Phenyleph Er] Other (See Comments)    Stomach ache   . Statins Other (See Comments)    Myalgias.  . Tramadol Nausea And Vomiting  . Chlorhexidine Rash    CHG wipes  . Keflet [Cephalexin] Rash    Severe stomach cramping  . Penicillins Rash  . Pneumococcal Polysaccharide Vaccine Rash    All over body  . Sulfa Antibiotics Rash   Medications Prior to Admission  Medication Sig Dispense Refill  . acetaminophen (TYLENOL) 500 MG tablet Take 2 tablets (1,000 mg total) by mouth every 8 (eight) hours as needed for moderate pain. 30 tablet 0  . apixaban (ELIQUIS) 5 MG TABS tablet Take 5 mg by mouth 2 (two) times daily.    . bisacodyl (DULCOLAX) 10 MG suppository Place 1 suppository (10 mg total) rectally daily as needed for moderate constipation. (Patient not taking: Reported on 12/13/2019) 12 suppository 0  . bumetanide (BUMEX) 2 MG tablet Take 2 mg by mouth daily.    . cholecalciferol (VITAMIN D) 25 MCG (1000 UNIT) tablet Take 1,000 Units by mouth daily.    . clopidogrel (PLAVIX) 75 MG tablet Take 75 mg by mouth daily.    Marland Kitchen docusate sodium (COLACE) 100 MG capsule Take 1 capsule (100 mg total) by mouth 2 (two) times daily. 10 capsule 0  . glucose blood (ONETOUCH ULTRA) test strip Use 6 times daily. E11.65 on insulin pump    . HUMALOG 100 UNIT/ML injection as directed. Via Insulin Pump    . Insulin Human (INSULIN PUMP) SOLN Inject into the skin as directed. With humalog insulin    . Insulin Syringe-Needle U-100 (INSULIN SYRINGE .3CC/31GX5/16") 31G X 5/16" 0.3 ML MISC Use 1 time daily    . metoprolol tartrate (LOPRESSOR) 25 MG tablet Take 12.5 mg by mouth 2 (two) times daily.    Marland Kitchen oxyCODONE (OXY IR/ROXICODONE) 5 MG immediate release tablet Take 1-2 tablets (5-10 mg total) by mouth every 4 (four) hours as needed for moderate pain (pain score 4-6). (Patient taking  differently: Take 5-10 mg by mouth at bedtime. ) 30 tablet 0  . polyethylene glycol (MIRALAX / GLYCOLAX) 17 g packet Take 17 g by mouth daily as needed for moderate constipation.    . predniSONE (DELTASONE) 5 MG tablet Take 5-10 mg by mouth See admin instructions. Take 10 mg by mouth in the morning & take 5 mg by mouth at night.      Drug Regimen Review Drug regimen was reviewed and remains appropriate with no significant issues identified  Home: Home Living Family/patient expects to be discharged to:: Private residence Living Arrangements: Spouse/significant other   Functional History:    Functional Status:  Mobility:          ADL:    Cognition: Cognition Orientation Level: Oriented X4    Physical Exam: Blood pressure (!) 158/68, pulse 87, temperature 98 F (36.7 C), temperature source Oral, resp. rate 17, height 5\' 4"  (1.626 m),  weight 83.9 kg. Physical Exam Vitals and nursing note reviewed. Exam conducted with a chaperone present.  Constitutional:      Appearance: She is obese.     Comments: Pt sitting up in bed- husband at bedside, upset about ordering lunch- it's 1:15pm; NAD  HENT:     Head: Normocephalic and atraumatic.     Right Ear: External ear normal.     Left Ear: External ear normal.     Nose: Nose normal. No congestion.     Mouth/Throat:     Mouth: Mucous membranes are moist.     Pharynx: Oropharynx is clear. No oropharyngeal exudate.  Eyes:     General:        Right eye: No discharge.        Left eye: No discharge.     Extraocular Movements: Extraocular movements intact.  Cardiovascular:     Comments: RRR- no JVD Pulmonary:     Comments: CTA B/L- no W/R/R- good air movement Abdominal:     Comments: Soft, NT, ND, (+)BS hyperactive  Musculoskeletal:     Cervical back: Normal range of motion. No rigidity.     Comments: UEs- 5-/5 in deltoids, biceps, triceps, WE, grip and finger abd B/L Has RA changes to her fingers R>L- chronic LEs- HF/KE at  least 2/5- very painful to touch/TTP- has wound VACs under shrinkers B/L  Skin:    Comments: Bilateral BKA sites are dressed with wound VAC in place  Has a Stage I vs II between buttocks on backside- not on sacrum or buttocks Ecchymoses on B/L UEs- IV L forearm- looks good  Neurological:     Comments: Patient is alert in no acute distress.  Oriented x3 and follows commands. Intact to light touch on UEs and BKAs B/L  Psychiatric:     Comments: Anxious- cordial; very upset with husband about eating     Results for orders placed or performed during the hospital encounter of 12/23/19 (from the past 48 hour(s))  Glucose, capillary     Status: Abnormal   Collection Time: 12/23/19 12:59 PM  Result Value Ref Range   Glucose-Capillary 214 (H) 70 - 99 mg/dL    Comment: Glucose reference range applies only to samples taken after fasting for at least 8 hours.   No results found.     Medical Problem List and Plan: 1.  Decreased functional mobility secondary to progressive bilateral lower extremity gangrenous changes with recent debridement and skin grafts.  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  Wound VAC as directed  -patient may not shower until wound VACs are off after 7 days  -ELOS/Goals: 2 weeks- supervision to min A 2.  Antithrombotics: -DVT/anticoagulation: Eliquis  -antiplatelet therapy: Plavix 3. Pain Management: Pt emphatic doesn't want gabapentin- is willing to try Duloxetine- not allergic to it- will try 30 mg QHS x4 days then 60 mg QHS;  oxycodone as needed 4. Mood: Provide emotional support  -antipsychotic agents: N/A 5. Neuropsych: This patient is capable of making decisions on her own behalf. 6. Skin/Wound Care: Routine skin checks- has Stage I vs Stage II between buttocks- have nurse reassess- pt couldn't hold position to assess- -wound VACs to be removed at 7 days- went on 10/6- to come off 10/13 7. Fluids/Electrolytes/Nutrition: Routine in and outs with follow-up  chemistries 8.  Acute blood loss anemia.  Follow-up CBC 9.  Rheumatoid arthritis.  Chronic prednisone 10.  CAD status post cardiac catheterization August 2021 underwent rotational atherectomy assisted multivessel PCI.  Continue  Plavix and Eliquis x12 months 11.  CKD stage IIIb.  Baseline creatinine 1.37 12.  Diastolic congestive heart failure.  Monitor for any signs of fluid overload.  Continue Bumex 2 mg daily 13.  Hypertension.  Lopressor 12.5 mg twice daily.  Monitor with increased mobility 14.  Diabetes mellitus TYPE I, not II with peripheral neuropathy.  Hemoglobin A1c 6.3.  Patient with insulin pump prior to admission.  Follow-up diabetic coordinator- will con't insulin pump, since pt has managed for >60 years.  15. Constipation- 1st BM since admission was today- added Senokot 1 tab daily- also on Dulcolax pills- for 3 days; might need Sorbitol, etc?? 16. Urinary and bowel urgency- is chronic for pt- has bedpan right next to her on bed.    Mcarthur Rossetti. Anguilli, PA-C 12/23/2019   I have personally performed a face to face diagnostic evaluation of this patient and formulated the key components of the plan.  Additionally, I have personally reviewed laboratory data, imaging studies, as well as relevant notes and concur with the physician assistant's documentation above.   The patient's status has not changed from the original H&P.  Any changes in documentation from the acute care chart have been noted above.     Genice Rouge, MD 12/23/2019

## 2019-12-24 ENCOUNTER — Inpatient Hospital Stay (HOSPITAL_COMMUNITY): Payer: Medicare Other

## 2019-12-24 ENCOUNTER — Inpatient Hospital Stay (HOSPITAL_COMMUNITY): Payer: Medicare Other | Admitting: Physical Therapy

## 2019-12-24 DIAGNOSIS — Z89512 Acquired absence of left leg below knee: Secondary | ICD-10-CM | POA: Diagnosis not present

## 2019-12-24 DIAGNOSIS — Z89511 Acquired absence of right leg below knee: Secondary | ICD-10-CM | POA: Diagnosis not present

## 2019-12-24 LAB — GLUCOSE, CAPILLARY
Glucose-Capillary: 39 mg/dL — CL (ref 70–99)
Glucose-Capillary: 166 mg/dL — ABNORMAL HIGH (ref 70–99)
Glucose-Capillary: 127 mg/dL — ABNORMAL HIGH (ref 70–99)
Glucose-Capillary: 150 mg/dL — ABNORMAL HIGH (ref 70–99)
Glucose-Capillary: 42 mg/dL — CL (ref 70–99)
Glucose-Capillary: 78 mg/dL (ref 70–99)

## 2019-12-24 LAB — CBC WITH DIFFERENTIAL/PLATELET
Abs Immature Granulocytes: 0.15 10*3/uL — ABNORMAL HIGH (ref 0.00–0.07)
Basophils Absolute: 0.1 10*3/uL (ref 0.0–0.1)
Basophils Relative: 1 %
Eosinophils Absolute: 0.1 10*3/uL (ref 0.0–0.5)
Eosinophils Relative: 1 %
HCT: 31.5 % — ABNORMAL LOW (ref 36.0–46.0)
Hemoglobin: 9.9 g/dL — ABNORMAL LOW (ref 12.0–15.0)
Immature Granulocytes: 2 %
Lymphocytes Relative: 17 %
Lymphs Abs: 1.5 10*3/uL (ref 0.7–4.0)
MCH: 28 pg (ref 26.0–34.0)
MCHC: 31.4 g/dL (ref 30.0–36.0)
MCV: 89 fL (ref 80.0–100.0)
Monocytes Absolute: 1.1 10*3/uL — ABNORMAL HIGH (ref 0.1–1.0)
Monocytes Relative: 12 %
Neutro Abs: 5.9 10*3/uL (ref 1.7–7.7)
Neutrophils Relative %: 67 %
Platelets: 466 10*3/uL — ABNORMAL HIGH (ref 150–400)
RBC: 3.54 MIL/uL — ABNORMAL LOW (ref 3.87–5.11)
RDW: 14.2 % (ref 11.5–15.5)
WBC: 8.8 10*3/uL (ref 4.0–10.5)
nRBC: 0 % (ref 0.0–0.2)

## 2019-12-24 LAB — COMPREHENSIVE METABOLIC PANEL
ALT: 28 U/L (ref 0–44)
AST: 25 U/L (ref 15–41)
Albumin: 2.2 g/dL — ABNORMAL LOW (ref 3.5–5.0)
Alkaline Phosphatase: 60 U/L (ref 38–126)
Anion gap: 12 (ref 5–15)
BUN: 23 mg/dL (ref 8–23)
CO2: 25 mmol/L (ref 22–32)
Calcium: 8.8 mg/dL — ABNORMAL LOW (ref 8.9–10.3)
Chloride: 101 mmol/L (ref 98–111)
Creatinine, Ser: 1.22 mg/dL — ABNORMAL HIGH (ref 0.44–1.00)
GFR, Estimated: 46 mL/min — ABNORMAL LOW (ref >60)
Glucose, Bld: 168 mg/dL — ABNORMAL HIGH (ref 70–99)
Potassium: 4.3 mmol/L (ref 3.5–5.1)
Sodium: 138 mmol/L (ref 135–145)
Total Bilirubin: 0.3 mg/dL (ref 0.3–1.2)
Total Protein: 5.7 g/dL — ABNORMAL LOW (ref 6.5–8.1)

## 2019-12-24 MED ORDER — DIPHENHYDRAMINE-ZINC ACETATE 2-0.1 % EX CREA
TOPICAL_CREAM | Freq: Two times a day (BID) | CUTANEOUS | Status: DC | PRN
  Filled 2019-12-24: qty 28

## 2019-12-24 NOTE — Progress Notes (Signed)
Occupational Therapy Session Note  Patient Details  Name: Kaitlin Hudson MRN: [id number] Date of Birth: 10/10/1955  Today's Date: 12/24/2019 OT Individual Time: 1400-1440 OT Individual Time Calculation (min): 40 min    Short Term Goals: Week 1:  OT Short Term Goal 1 (Week 1): Pt will complete BSC transfer with min A OT Short Term Goal 2 (Week 1): Pt will complete toileting tasks with min A OT Short Term Goal 3 (Week 1): Pt will don LB clothing with CGA OT Short Term Goal 4 (Week 1): Pt will correctly position BLE in bed with no more than min cueing  Skilled Therapeutic Interventions/Progress Updates:    Pt received sitting in the w/c with c/o pain in her B residual limbs, soreness, 4/10. Pt's husband also now present. Extensive discussion re home set up, as husband brought in measurements from bathroom and various pieces of furniture pt prefers. Discussed equipment and problem solved through transfers and home set up. A bariatric BSC was obtained and pt completed an A/P transfer to void urine. Mod A to manage clothing, and min A for the transfer. Small skin tear obtained on the L forearm during transfer- RN notified. Pt returned to her w/c. Pt and her husband were given demo of A/P transfer and SB transfer to/from BSC for edu. Pt was left sitting up with all needs met, husband present.   Therapy Documentation Precautions:  Precautions Precautions: Fall, Other (comment) Precaution Comments: x2 wound vacs Restrictions Weight Bearing Restrictions: Yes RLE Weight Bearing: Non weight bearing LLE Weight Bearing: Non weight bearing   Therapy/Group: Individual Therapy  Sandra H Davis 12/24/2019, 3:30 PM 

## 2019-12-24 NOTE — Progress Notes (Signed)
Physical Therapy Session Note  Patient Details  Name: Kaitlin Hudson MRN: [id number] Date of Birth: 1956/02/26  Today's Date: 12/24/2019 PT Individual Time: 1630-1700 PT Individual Time Calculation (min): 30 min   Short Term Goals: Week 1:   See PT evaluation  Skilled Therapeutic Interventions/Progress Updates:    Pt received seated in w/c in room crying out in pain and requesting to return to bed. Pt requires assist x 2 for A/P transfer back to bed for safety due to pain, anxiety, and fear of falling during transfer. Pt requires max encouragement and emotional support for transfer. Once back in bed pt is min A for rolling R/L for positioning of bed linens as well as min A for supine to/from sit. Nursing in room and able to provide pain medication during session for BLE pain. Pt then agreeable to bed level therex: Semi-reclined BLE strengthening therex: quad sets, SLR with AAROM x 10 reps each. Pt left seated in bed with needs in reach, bed alarm in place at end of session.  Therapy Documentation Precautions:  Precautions Precautions: Fall, Other (comment) Precaution Comments: x2 wound vacs Restrictions Weight Bearing Restrictions: Yes RLE Weight Bearing: Non weight bearing LLE Weight Bearing: Non weight bearing    Therapy/Group: Individual Therapy   Peter Congo, PT, DPT  12/24/2019, 5:02 PM

## 2019-12-24 NOTE — Progress Notes (Signed)
Inpatient Diabetes Program Recommendations  AACE/ADA: New Consensus Statement on Inpatient Glycemic Control (2015)  Target Ranges:  Prepandial:   less than 140 mg/dL      Peak postprandial:   less than 180 mg/dL (1-2 hours)      Critically ill patients:  140 - 180 mg/dL   Lab Results  Component Value Date   GLUCAP 166 (H) 12/24/2019   HGBA1C 6.3 (H) 12/19/2019    Review of Glycemic ControlResults for LEEYAH, HEATHER (MRN [id number]) as of 12/24/2019 09:47  Ref. Range 12/23/2019 08:03 12/23/2019 12:59 12/23/2019 16:52 12/23/2019 23:50 12/24/2019 06:04  Glucose-Capillary Latest Ref Range: 70 - 99 mg/dL 127 (H) 517 (H) 74 88 001 (H)   Diabetes history:  DM1 Outpatient Diabetes medications:  Medtronic Insulin pump with Humalog Basal 33.55 units/24 hrs Current orders for Inpatient glycemic control: Insulin pump per home settings  Inpatient Diabetes Program Recommendations:    Blood sugars currently well managed.  No recommendations.  Will follow.   Thanks  Beryl Meager, RN, BC-ADM Inpatient Diabetes Coordinator Pager [phone] (8a-5p)

## 2019-12-24 NOTE — Progress Notes (Signed)
Inpatient Rehabilitation Center Individual Statement of Services  Patient Name:  Aniylah Avans  Date:  12/24/2019  Welcome to the Inpatient Rehabilitation Center.  Our goal is to provide you with an individualized program based on your diagnosis and situation, designed to meet your specific needs.  With this comprehensive rehabilitation program, you will be expected to participate in at least 3 hours of rehabilitation therapies Monday-Friday, with modified therapy programming on the weekends.  Your rehabilitation program will include the following services:  Physical Therapy (PT), Occupational Therapy (OT), Speech Therapy (ST), 24 hour per day rehabilitation nursing, Therapeutic Recreaction (TR), Neuropsychology, Care Coordinator, Rehabilitation Medicine, Nutrition Services, Pharmacy Services and Other  Weekly team conferences will be held on Tuesday to discuss your progress.  Your Inpatient Rehabilitation Care Coordinator will talk with you frequently to get your input and to update you on team discussions.  Team conferences with you and your family in attendance may also be held.  Expected length of stay: 2 weeks  Overall anticipated outcome: Min A  Depending on your progress and recovery, your program may change. Your Inpatient Rehabilitation Care Coordinator will coordinate services and will keep you informed of any changes. Your Inpatient Rehabilitation Care Coordinator's name and contact numbers are listed  below.  The following services may also be recommended but are not provided by the Inpatient Rehabilitation Center:    Home Health Rehabiltiation Services  Outpatient Rehabilitation Services    Arrangements will be made to provide these services after discharge if needed.  Arrangements include referral to agencies that provide these services.  Your insurance has been verified to be:  Medicare Your primary doctor is:  [place], Location manager  Pertinent information will be shared with your  doctor and your insurance company.  Inpatient Rehabilitation Care Coordinator:  Susie Cassette [phone] or ([phone]  Information discussed with and copy given to patient by: Andria Rhein, 12/24/2019, 10:38 AM

## 2019-12-24 NOTE — Progress Notes (Signed)
Hypoglycemic Event  CBG: 42  Treatment: 8 oz juice/soda, peanut butter w/ crackers.  Symptoms: None  Follow-up CBG: Time:2156 CBG Result: 78  Possible Reasons for Event: Inadequate meal intake; Pt states she knows she didn't eat enough of her dinner cause she was counting carbs and thought she would go over.  Comments/MD notified: Hypoglycemic protocol followed. PT is alert and stable.     Kaitlin Hudson A Jackie Russman

## 2019-12-24 NOTE — Evaluation (Addendum)
Physical Therapy Assessment and Plan  Patient Details  Name: Kaitlin Hudson MRN: [id number] Date of Birth: 09-19-1955  PT Diagnosis: Difficulty walking, Edema, Muscle spasms, Muscle weakness and Pain in B residual limbs Rehab Potential: Excellent ELOS: 2-2.5 weeks    Today's Date: 12/24/2019 PT Individual Time: 9211-9417 PT Individual Time Calculation (min): 70 min    Hospital Problem: Principal Problem:   S/P bilateral below knee amputation [facility])   Past Medical History:  Past Medical History:  Diagnosis Date  . Arthritis, rheumatoid ([place])   . CHF (congestive heart failure) ([place])    pt denies this dx  . Coronary artery disease   . Diabetes mellitus without complication ([place])   . Headache   . Hyperlipidemia   . Hypertension   . Latent tuberculosis    pt denies this dx  . Peripheral vascular disease ([place])    blood clot in left leg  . Type 1 diabetes ([place])    Past Surgical History:  Past Surgical History:  Procedure Laterality Date  . AMPUTATION Bilateral 12/19/2019   Procedure: BILATERAL BELOW KNEE AMPUTATION;  Surgeon: Newt Minion, MD;  Location: [place];  Service: Orthopedics;  Laterality: Bilateral;  . CARDIAC CATHETERIZATION  11/02/2019  . CARPAL TUNNEL RELEASE Bilateral   . EYE SURGERY Bilateral    09/2018  . FINGER SURGERY  07/11/2014   right middle finger - nodule removed  . FINGER SURGERY  02/20/2017   right middle finger  . FOOT SURGERY Right 02/26/2014   screw  . I & D EXTREMITY Right 08/15/2019   Procedure: RIGHT ACHILLES DEBRIDEMENT, POSSIBLE SKIN GRAFT;  Surgeon: Newt Minion, MD;  Location: [place];  Service: Orthopedics;  Laterality: Right;  . JOINT REPLACEMENT    . SHOULDER SURGERY Right 01/06/2005  . TRIGGER FINGER RELEASE  12/202002   left hand -   . WRIST SURGERY Left 10/23/2004   and middle finger  . WRIST SURGERY Right 11/05/2005  . WRIST SURGERY Right 03/11/2006   cyst removed    Assessment & Plan Clinical Impression: Patient is a 64 y.o.  year old right-handed female history of RA with chronic prednisone, diastolic congestive heart failure, CAD followed at [facility] status post cardiac cath August 2021 underwent rotational atherectomy assisted multivessel PCI maintained on Plavix and Eliquis x12 months, CKD stage II with creatinine 1.37, hypertension, hyperlipidemia, diabetes mellitus with insulin pump and peripheral neuropathy.  Per chart review she lives with spouse.  1 level home with ramped entrance.  Modified independent with rolling walker prior to admission.  Presented 12/19/2019 with bilateral ischemic ulcers and lower extremity rest pain.  She is status post debridement and skin graft for right Achilles ulcer 4 months out from surgery.  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 Sharol Given.  Wound vacs as directed.  Acute blood loss anemia 8.8.  Therapy evaluations completed and patient was admitted for a comprehensive rehab program. Patient transferred to CIR on 12/23/2019 .   Patient currently requires mod with mobility secondary to muscle weakness and muscle joint tightness, decreased cardiorespiratoy endurance and decreased sitting balance, decreased standing balance, decreased postural control, decreased balance strategies and difficulty maintaining precautions.  Prior to hospitalization, patient was modified independent  with mobility and lived with Spouse in a House home.  Home access is  Ramped entrance.  Patient will benefit from skilled PT intervention to maximize safe functional mobility, minimize  fall risk and decrease caregiver burden for planned discharge home with intermittent assist.  Anticipate patient will benefit from follow up [facility] at discharge.  PT - End of Session Activity Tolerance: Tolerates 30+ min activity with multiple rests Endurance Deficit: Yes PT  Assessment Rehab Potential (ACUTE/IP ONLY): Excellent PT Barriers to Discharge: Decreased caregiver support;Home environment access/layout;Incontinence;Lack of/limited family support;Weight bearing restrictions PT Barriers to Discharge Comments: Patient's husband is only caregiver available upon d/c, will need to be able to be home alone for 1-2 hours at a time, new incontience due to mobility deficits, B LE NWB, narrow doorways do not allow manual w/c access to rooms, has power chair that can access rooms PT Patient demonstrates impairments in the following area(s): Balance;Pain;Edema;Safety;Endurance;Sensory;Skin Integrity;Motor;Nutrition PT Transfers Functional Problem(s): Bed Mobility;Bed to Chair;Car;Furniture PT Locomotion Functional Problem(s): Ambulation;Wheelchair Mobility;Stairs PT Plan PT Intensity: Minimum of 1-2 x/day ,45 to 90 minutes PT Frequency: 5 out of 7 days PT Duration Estimated Length of Stay: 2-2.5 weeks PT Treatment/Interventions: Balance/vestibular training;Community reintegration;Discharge planning;Disease management/prevention;DME/adaptive equipment instruction;Functional electrical stimulation;Functional mobility training;Neuromuscular re-education;Pain management;Patient/family education;Psychosocial support;Skin care/wound management;Splinting/orthotics;Stair training;Therapeutic Activities;Therapeutic Exercise;UE/LE Strength taining/ROM;UE/LE Coordination activities;Wheelchair propulsion/positioning PT Transfers Anticipated Outcome(s): Supervision PT Locomotion Anticipated Outcome(s): mod I w/c level PT Recommendation Recommendations for Other Services: Neuropsych consult;Therapeutic Recreation consult Therapeutic Recreation Interventions: Stress management Follow Up Recommendations: Home health PT Patient destination: Home Equipment Recommended: To be determined Equipment Details: Patient reports that she has a RW, manual w/c, 3-in-1 BSC, and a power mobility  device, will determine additional DME needs as patient progresses   PT Evaluation Precautions/Restrictions Precautions Precautions: Fall;Other (comment) Precaution Comments: x2 wound vacs Restrictions RLE Weight Bearing: Non weight bearing LLE Weight Bearing: Non weight bearing General   Vital SignsTherapy Vitals Temp: 98.2 F (36.8 C) Pulse Rate: (!) 110 Resp: 17 BP: 139/82 Patient Position (if appropriate): Sitting Oxygen Therapy SpO2: 93 % O2 Device: Room Air Pain   Home Living/Prior Functioning Home Living Available Help at Discharge: Family;Available 24 hours/day Type of Home: House Home Access: Ramped entrance Home Layout: One level Bathroom Shower/Tub: Multimedia programmer: Standard Bathroom Accessibility: Yes Additional Comments: spouse very supportive and has been assisting with medical care   Lives With: Spouse Prior Function Level of Independence: Independent with basic ADLs;Independent with homemaking with wheelchair;Requires assistive device for independence Vocation: Full time employment 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, art Vision/Perception  Perception Perception: Within [place]: Intact  Cognition Overall Cognitive Status: Within Functional Limits for tasks assessed Arousal/Alertness: Awake/alert Attention: Selective Selective Attention: Appears intact Memory: Appears intact Awareness: Appears intact Problem Solving: Appears intact Safety/Judgment: Appears intact Comments: Pt has high anxiety Sensation Sensation Light Touch: Appears Intact (Pt reports intact sensation at residual limbs) Hot/Cold: Appears Intact Coordination Gross Motor Movements are Fluid and Coordinated: No Fine Motor Movements are Fluid and Coordinated: Yes Coordination and Movement Description: B BKA's Motor  Motor Motor: Other (comment);Abnormal postural alignment and control Motor - Skilled Clinical Observations:  generalized weakness, decreased balance due to new B BKAs   Trunk/Postural Assessment  Cervical Assessment Cervical Assessment: Within Functional Limits Thoracic Assessment Thoracic Assessment: Within Functional Limits Lumbar Assessment Lumbar Assessment: Exceptions to [facility] (posterior pelvic tilt) Postural Control Postural Control: Deficits on evaluation (some limitations in righting reactions)  Balance Balance Balance Assessed: Yes Static Sitting Balance Static Sitting - Balance Support: No upper extremity supported Static Sitting - Level of Assistance: 5: Stand by assistance Dynamic Sitting Balance Dynamic Sitting - Balance Support: No upper extremity supported;During functional activity  Dynamic Sitting - Level of Assistance: 5: Stand by assistance Extremity Assessment      RLE Assessment RLE Assessment: Exceptions to [facility] Active Range of Motion (AROM) Comments: Limited knee flexion ~90 degrees, knee extension lacking ~5 degrees limited by pain/edema General Strength Comments: Grossly at least 3+/5, did not tolerate formal strength testing due to pain/muscle spasms during evaluation LLE Assessment LLE Assessment: Exceptions to [facility] Active Range of Motion (AROM) Comments: Limited knee flexion ~90 degrees, knee extension lacking ~5 degrees limited by pain/edema General Strength Comments: Grossly at least 3+/5, did not tolerate formal strength testing due to pain/muscle spasms during evaluation  Care Tool Care Tool Bed Mobility Roll left and right activity   Roll left and right assist level: Minimal Assistance - Patient > 75%    Sit to lying activity   Sit to lying assist level: Supervision/Verbal cueing    Lying to sitting edge of bed activity   Lying to sitting edge of bed assist level: Supervision/Verbal cueing     Care Tool Transfers Sit to stand transfer Sit to stand activity did not occur: Safety/medical concerns (New B BKA, B LEs NWB)      Chair/bed transfer    Chair/bed transfer assist level: Maximal Assistance - Patient 25 - 49%     Toilet transfer Toilet transfer activity did not occur: Safety/medical concerns (decreased trunk cotrol/balance)      Scientist, product/process development transfer activity did not occur: Safety/medical concerns (decreased trunk control/balanc, increased anxiety with mobility)        Care Tool Locomotion Ambulation Ambulation activity did not occur: Safety/medical concerns (New B BKA, B LEs NWB)        Walk 10 feet activity Walk 10 feet activity did not occur: Safety/medical concerns       Walk 50 feet with 2 turns activity Walk 50 feet with 2 turns activity did not occur: Safety/medical concerns      Walk 150 feet activity Walk 150 feet activity did not occur: Safety/medical concerns      Walk 10 feet on uneven surfaces activity Walk 10 feet on uneven surfaces activity did not occur: Safety/medical concerns      Stairs Stair activity did not occur: Safety/medical concerns (New B BKA, B LEs NWB)        Walk up/down 1 step activity Walk up/down 1 step or curb (drop down) activity did not occur: Safety/medical concerns     Walk up/down 4 steps activity did not occuR: Safety/medical concerns  Walk up/down 4 steps activity      Walk up/down 12 steps activity Walk up/down 12 steps activity did not occur: Safety/medical concerns      Pick up small objects from floor Pick up small object from the floor (from standing position) activity did not occur: Safety/medical concerns (New B BKA, B LEs NWB)      Wheelchair     Wheelchair activity did not occur: Safety/medical concerns (increased pain and anxiety during evalutaiton)      Wheel 50 feet with 2 turns activity Wheelchair 50 feet with 2 turns activity did not occur: Safety/medical concerns    Wheel 150 feet activity Wheelchair 150 feet activity did not occur: Safety/medical concerns      Refer to Care Plan for Long Term Goals  SHORT TERM GOAL WEEK 1 PT Short  Term Goal 1 (Week 1): Patient will perform bed mobility with supervision. PT Short Term Goal 2 (Week 1): Patient will perform pressure relief independently when seated in w/c.  PT Short Term Goal 3 (Week 1): Patient will perform basic transfers with min A. PT Short Term Goal 4 (Week 1): Patient will propel w/c >100 feet with supervision.  Recommendations for other services: Neuropsych and Therapeutic Recreation  Stress management  Skilled Therapeutic Intervention In addition to the PT evaluation above, the patient performed the following skilled PT interventions:  Patient in w/c attempting to use the female urinal upon PT arrival. Patient alert and agreeable to early PT session to accommodate change in PT's schedule. Patient reported 5-7/10 B residual limb pain with intermittent muscle spasms during session, RN made aware. PT provided repositioning, rest breaks, and distraction as pain interventions throughout session. Educated on use of desensitization and diaphragmatic breathing for pain and spasm management throughout session.   Therapeutic Activity: Patient utilized the female urinal with min A for placement to void while seated in the w/c. Patient was unsuccessful with positioning and soiled her shorts and the w/c seat. Returned to bed to use bed pan and perform peri-care and change shorts. Bed Mobility: Patient performed supine to/from long sitting with supervision in a flat bed without use of bed rails. She performed rolling with and without bed rails with min A. Provided verbal cues for bringing opposite knee to chest for brining hips over during rolling on second trials with use of the bed rail. Performed peri-care with set-up assist. Donned incontinence brief with total A to assist with bladder management when OOB until mobility improves. Donned shorts with min-mod A, patient initiated threading LEs and pulling pants up, required intermittent assist for management of clothing over residual  limbs and total A for management of wound vac lines. Transfers: Patient performed an P/A transfer w/c>bed with min A initially, then max A due to increased anxiety at junction between the w/c and the bed. She then performed an A/P slide board transfer bed>w/c with min-mod A and total A for board placement. Provided cues for hand placement, board placement, and head-hips relationship for proper technique and decreased assist with transfers.   Patient was very anxious and became tearful several times during session. Reports increased frustration at loss of independence and mobility due to B BKA. PT provided emotional support and education on amputee recovery, expectations for mobility at d/c and later with prothesis, goals to attain success with prothesis, and adaptive equipment to assist with increased independence. Patient receptive to education, however, will need further reinforcement throughout rehab stay.  Instructed pt in results of PT evaluation as detailed above, PT POC, rehab potential, rehab goals, and discharge recommendations. Additionally discussed CIR's policies regarding fall safety and use of chair alarm and/or quick release belt. Pt verbalized understanding and in agreement. Will update pt's family members as they become available.   Patient in w/c at end of session with breaks locked, chair alarm set, and all needs within reach.   Discharge Criteria: Patient will be discharged from PT if patient refuses treatment 3 consecutive times without medical reason, if treatment goals not met, if there is a change in medical status, if patient makes no progress towards goals or if patient is discharged from hospital.  The above assessment, treatment plan, treatment alternatives and goals were discussed and mutually agreed upon: by patient  Doreene Burke PT, DPT  12/24/2019, 5:25 PM

## 2019-12-24 NOTE — Evaluation (Signed)
Occupational Therapy Assessment and Plan  Patient Details  Name: Kaitlin Hudson MRN: [id number] Date of Birth: 1956/01/15  OT Diagnosis: muscle weakness (generalized) and bilateral below knee amputation Rehab Potential: Rehab Potential (ACUTE ONLY): Good ELOS: 2 weeks   Today's Date: 12/24/2019 OT Individual Time: 1478-2956 OT Individual Time Calculation (min): 75 min     Hospital Problem: Principal Problem:   S/P bilateral below knee amputation [facility])   Past Medical History:  Past Medical History:  Diagnosis Date  . Arthritis, rheumatoid ([place])   . CHF (congestive heart failure) ([place])    pt denies this dx  . Coronary artery disease   . Diabetes mellitus without complication ([place])   . Headache   . Hyperlipidemia   . Hypertension   . Latent tuberculosis    pt denies this dx  . Peripheral vascular disease ([place])    blood clot in left leg  . Type 1 diabetes ([place])    Past Surgical History:  Past Surgical History:  Procedure Laterality Date  . AMPUTATION Bilateral 12/19/2019   Procedure: BILATERAL BELOW KNEE AMPUTATION;  Surgeon: Newt Minion, MD;  Location: [place];  Service: Orthopedics;  Laterality: Bilateral;  . CARDIAC CATHETERIZATION  11/02/2019  . CARPAL TUNNEL RELEASE Bilateral   . EYE SURGERY Bilateral    09/2018  . FINGER SURGERY  07/11/2014   right middle finger - nodule removed  . FINGER SURGERY  02/20/2017   right middle finger  . FOOT SURGERY Right 02/26/2014   screw  . I & D EXTREMITY Right 08/15/2019   Procedure: RIGHT ACHILLES DEBRIDEMENT, POSSIBLE SKIN GRAFT;  Surgeon: Newt Minion, MD;  Location: [place];  Service: Orthopedics;  Laterality: Right;  . JOINT REPLACEMENT    . SHOULDER SURGERY Right 01/06/2005  . TRIGGER FINGER RELEASE  12/202002   left hand -   . WRIST SURGERY Left 10/23/2004   and middle finger  . WRIST SURGERY Right 11/05/2005  . WRIST SURGERY Right 03/11/2006   cyst removed    Assessment & Plan Clinical Impression: Kaitlin Hudson is a  64 year old right-handed female history of RA with chronic prednisone, diastolic congestive heart failure, CAD followed at [facility] status post cardiac cath August 2021 underwent rotational atherectomy assisted multivessel PCI maintained on Plavix and Eliquis x12 months, CKD stage II with creatinine 1.37, hypertension, hyperlipidemia, diabetes mellitus with insulin pump and peripheral neuropathy.  Per chart review she lives with spouse.  1 level home with ramped entrance.  Modified independent with rolling walker prior to admission.  Presented 12/19/2019 with bilateral ischemic ulcers and lower extremity rest pain.  She is status post debridement and skin graft for right Achilles ulcer 4 months out from surgery.  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 Sharol Given.  Wound vacs as directed.  Acute blood loss anemia 8.8.  Therapy evaluations completed and patient was admitted for a comprehensive rehab program. Pt reports first BM since admission was today- also has B/B urgency- when needs to go, HAS to go immediately- has bedpan next to her in bed.  Oxy helps pain, but hates ot takes because constipation.  Also having a lot of phantom pain.   Patient transferred to CIR on 12/23/2019 .    Patient currently requires mod with basic self-care skills secondary to muscle weakness, decreased cardiorespiratoy endurance and decreased sitting balance, decreased postural control, decreased balance strategies and difficulty maintaining precautions.  Prior to hospitalization, patient could complete ADLs with modified independent .  Patient will benefit from skilled intervention to decrease level of assist with basic self-care skills prior to discharge home with care partner.  Anticipate patient will require 24 hour supervision and follow up home health.  OT - End of  Session Activity Tolerance: Tolerates 10 - 20 min activity with multiple rests Endurance Deficit: Yes Endurance Deficit Description: Generalized weakness OT Assessment Rehab Potential (ACUTE ONLY): Good OT Patient demonstrates impairments in the following area(s): Balance;Skin Integrity;Endurance;Motor;Pain;Safety OT Basic ADL's Functional Problem(s): Bathing;Dressing;Toileting OT Transfers Functional Problem(s): Toilet;Tub/Shower OT Additional Impairment(s): None OT Plan OT Intensity: Minimum of 1-2 x/day, 45 to 90 minutes OT Frequency: 5 out of 7 days OT Duration/Estimated Length of Stay: 2 weeks OT Treatment/Interventions: Balance/vestibular training;Discharge planning;Self Care/advanced ADL retraining;Pain management;Therapeutic Activities;UE/LE Coordination activities;Therapeutic Exercise;Skin care/wound managment;Patient/family education;Functional mobility training;DME/adaptive equipment instruction;Community reintegration;Psychosocial support;UE/LE Strength taining/ROM;Wheelchair propulsion/positioning OT Self Feeding Anticipated Outcome(s): no goal set OT Basic Self-Care Anticipated Outcome(s): (S) OT Toileting Anticipated Outcome(s): (S) OT Bathroom Transfers Anticipated Outcome(s): (S) OT Recommendation Recommendations for Other Services: Neuropsych consult Patient destination: Home Follow Up Recommendations: Home health OT Equipment Recommended: To be determined   OT Evaluation Precautions/Restrictions  Precautions Precautions: Fall;Other (comment) Precaution Comments: x2 wound vacs Restrictions Weight Bearing Restrictions: Yes RLE Weight Bearing: Non weight bearing LLE Weight Bearing: Non weight bearing General Chart Reviewed: Yes Family/Caregiver Present: No Pain Pain Assessment Pain Scale: 0-10 Pain Score: 3  Pain Type: Surgical pain Pain Location: Leg (B residual limbs) Pain Orientation: Right;Left Pain Descriptors / Indicators: Aching Pain Onset:  On-going Home Living/Prior Functioning Home Living Family/patient expects to be discharged to:: Private residence Living Arrangements: Spouse/significant other Available Help at Discharge: Family, Available 24 hours/day Type of Home: House Home Access: Ramped entrance Home Layout: One level Bathroom Shower/Tub: Multimedia programmer: Standard Bathroom Accessibility: Yes Additional Comments: spouse very supportive and has been assisting with medical care   Lives With: Spouse IADL History Homemaking Responsibilities: Yes Meal Prep Responsibility: Secondary Laundry Responsibility: Secondary Cleaning Responsibility: Secondary Bill Paying/Finance Responsibility: Secondary Shopping Responsibility: Secondary Prior Function Level of Independence: Independent with basic ADLs, Independent with homemaking with wheelchair, Requires assistive device for independence Vocation: Full time employment 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 Vision/History: No visual deficits Patient Visual Report: No change from baseline Vision Assessment?: No apparent visual deficits Perception  Perception: Within Functional Limits Praxis Praxis: Intact Cognition Overall Cognitive Status: Within Functional Limits for tasks assessed Arousal/Alertness: Awake/alert Orientation Level: Person;Situation;Place Person: Oriented Place: Oriented Situation: Oriented Year: 2021 Month: October Day of Week: Correct Memory: Appears intact Immediate Memory Recall: Sock;Blue;Bed Memory Recall Sock: Without Cue Memory Recall Blue: Without Cue Memory Recall Bed: Not able to recall Attention: Selective Selective Attention: Appears intact Awareness: Appears intact Problem Solving: Appears intact Safety/Judgment: Appears intact Comments: Pt has high anxiety Sensation Sensation Light Touch: Appears Intact (Pt reports intact sensation at residual limbs) Hot/Cold: Appears  Intact Coordination Gross Motor Movements are Fluid and Coordinated: No Fine Motor Movements are Fluid and Coordinated: Yes Coordination and Movement Description: B BKA's Motor  Motor Motor: Other (comment) Motor - Skilled Clinical Observations: generalized weakness  Trunk/Postural Assessment  Cervical Assessment Cervical Assessment: Within Functional Limits Thoracic Assessment Thoracic Assessment: Within Functional Limits Lumbar Assessment Lumbar Assessment: Exceptions to [facility] (posterior pelvic tilt) Postural Control Postural Control: Deficits on evaluation (some limitations in righting reactions)  Balance Balance Balance Assessed: Yes Static Sitting Balance Static Sitting - Balance Support: No upper  extremity supported Static Sitting - Level of Assistance: 5: Stand by assistance Dynamic Sitting Balance Dynamic Sitting - Balance Support: No upper extremity supported;During functional activity Dynamic Sitting - Level of Assistance: 5: Stand by assistance Extremity/Trunk Assessment RUE Assessment RUE Assessment: Exceptions to [facility] General Strength Comments: 4/5 MMT LUE Assessment LUE Assessment: Exceptions to [facility] General Strength Comments: 4/5 MMT  Care Tool Care Tool Self Care Eating   Eating Assist Level: Independent    Oral Care    Oral Care Assist Level: Set up assist    Bathing   Body parts bathed by patient: Right arm;Left arm;Chest;Abdomen;Front perineal area;Buttocks;Right upper leg;Left upper leg;Face   Body parts n/a: Right lower leg;Left lower leg (B BKA) Assist Level: Minimal Assistance - Patient > 75%    Upper Body Dressing(including orthotics)   What is the patient wearing?: Pull over shirt;Bra   Assist Level: Supervision/Verbal cueing    Lower Body Dressing (excluding footwear)   What is the patient wearing?: Pants Assist for lower body dressing: Moderate Assistance - Patient 50 - 74%    Putting on/Taking off footwear Putting on/taking off  footwear activity did not occur: N/A (B BKA)           Care Tool Toileting Toileting activity Toileting Activity did not occur (Clothing management and hygiene only): N/A (no void or bm)       Care Tool Bed Mobility Roll left and right activity   Roll left and right assist level: Minimal Assistance - Patient > 75%    Sit to lying activity   Sit to lying assist level: Minimal Assistance - Patient > 75%    Lying to sitting edge of bed activity   Lying to sitting edge of bed assist level: Minimal Assistance - Patient > 75%     Care Tool Transfers Sit to stand transfer Sit to stand activity did not occur: N/A (B BKA)      Chair/bed transfer   Chair/bed transfer assist level: Minimal Assistance - Patient > 75%     Toilet transfer Toilet transfer activity did not occur: N/A       Care Tool Cognition Expression of Ideas and Wants Expression of Ideas and Wants: Without difficulty (complex and basic) - expresses complex messages without difficulty and with speech that is clear and easy to understand   Understanding Verbal and Non-Verbal Content Understanding Verbal and Non-Verbal Content: Understands (complex and basic) - clear comprehension without cues or repetitions   Memory/Recall Ability *first 3 days only Memory/Recall Ability *first 3 days only: Current season;Location of own room;Staff names and faces;That he or she is in a hospital/hospital unit    Refer to Care Plan for [place] 1 OT Short Term Goal 1 (Week 1): Pt will complete BSC transfer with min A OT Short Term Goal 2 (Week 1): Pt will complete toileting tasks with min A OT Short Term Goal 3 (Week 1): Pt will don LB clothing with CGA OT Short Term Goal 4 (Week 1): Pt will correctly position BLE in bed with no more than min cueing  Recommendations for other services: Neuropsych   Skilled OT evaluation completed. Pt tearful upon entry re situation. Recommend neuropsych to consult for  evaluation and coping. Pt completed ADLs sitting up in bed as described below. Cueing provided for positioning and body mechanics. W/c and leg rests obtained for pt and were adjusted to ensure neutral hip positioning and reduced hip/knee flexion. Pt edu on limb  loss and future prosthesis. Pt completed anterior/posterior transfer with min A to the w/c with heavy cueing for support and encouragement. Pt used female urinal seated with mod A. Estimate 2 week CIR stay to reach (S) level goals.   Skilled Therapeutic Intervention ADL ADL Eating: Set up Where Assessed-Eating: Bed level Grooming: Supervision/safety Where Assessed-Grooming: Bed level Upper Body Bathing: Supervision/safety Where Assessed-Upper Body Bathing: Bed level Lower Body Bathing: Minimal assistance Where Assessed-Lower Body Bathing: Bed level Upper Body Dressing: Supervision/safety Where Assessed-Upper Body Dressing: Bed level Lower Body Dressing: Moderate assistance Where Assessed-Lower Body Dressing: Bed level Toileting: Moderate assistance Where Assessed-Toileting: Wheelchair (using urinal) Toilet Transfer: Unable to assess Tub/Shower Transfer: Unable to assess Tub/Shower Transfer Method: Unable to assess Social research officer, government: Unable to assess [employer] Transfer Method: Unable to assess Mobility  Bed Mobility Bed Mobility: Rolling Right;Rolling Left;Supine to Sit;Sit to Supine Rolling Right: Minimal Assistance - Patient > 75% Rolling Left: Minimal Assistance - Patient > 75% Supine to Sit: Minimal Assistance - Patient > 75% Sit to Supine: Minimal Assistance - Patient > 75%   Discharge Criteria: Patient will be discharged from OT if patient refuses treatment 3 consecutive times without medical reason, if treatment goals not met, if there is a change in medical status, if patient makes no progress towards goals or if patient is discharged from hospital.  The above assessment, treatment plan, treatment  alternatives and goals were discussed and mutually agreed upon: by patient  Curtis Sites 12/24/2019, 10:01 AM

## 2019-12-24 NOTE — Progress Notes (Signed)
Inpatient Rehabilitation  Patient information reviewed and entered into eRehab system by Kateryna Grantham M. Omero Kowal, M.A., CCC/SLP, PPS Coordinator.  Information including medical coding, functional ability and quality indicators will be reviewed and updated through discharge.    

## 2019-12-24 NOTE — Progress Notes (Signed)
[facility] PHYSICAL MEDICINE & REHABILITATION PROGRESS NOTE   Subjective/Complaints:  Has itching after she takes Oxycodone , no drowsiness, pain control is good  ROS- neg CP, SOB, N/V/D  Objective:   No results found. Recent Labs    12/24/19 0620  WBC 8.8  HGB 9.9*  HCT 31.5*  PLT 466*   Recent Labs    12/24/19 0620  NA 138  K 4.3  CL 101  CO2 25  GLUCOSE 168*  BUN 23  CREATININE 1.22*  CALCIUM 8.8*    Intake/Output Summary (Last 24 hours) at 12/24/2019 0818 Last data filed at 12/23/2019 1900 Gross per 24 hour  Intake 240 ml  Output --  Net 240 ml        Physical Exam: Vital Signs Blood pressure 138/60, pulse 97, temperature 98.3 F (36.8 C), resp. rate 17, height 5\' 4"  (1.626 m), weight 83.9 kg, SpO2 100 %.   General: No acute distress Mood and affect are appropriate Heart: Tachycardia and Regular rhythm no rubs murmurs or extra sounds Lungs: Clear to auscultation, breathing unlabored, no rales or wheezes Abdomen: Positive bowel sounds, soft nontender to palpation, nondistended Extremities: No clubbing, cyanosis, or edema Skin: No evidence of breakdown, no evidence of rash Neurologic: Cranial nerves II through XII intact, motor strength is 5/5 in bilateral deltoid, bicep, tricep, grip, 4- hip flexor,, s/p B BKA Sensory exam normal sensation to light touch and proprioception in bilateral upper and lower extremities Cerebellar exam normal finger to nose to finger as well as heel to shin in bilateral upper and lower extremities Musculoskeletal: B BKA with wound vacs to incsions   Assessment/Plan: 1. Functional deficits secondary to B BKA which require 3+ hours per day of interdisciplinary therapy in a comprehensive inpatient rehab setting.  Physiatrist is providing close team supervision and 24 hour management of active medical problems listed below.  Physiatrist and rehab team continue to assess barriers to discharge/monitor patient progress toward  functional and medical goals  Care Tool:  Bathing              Bathing assist       Upper Body Dressing/Undressing Upper body dressing        Upper body assist      Lower Body Dressing/Undressing Lower body dressing            Lower body assist       Toileting Toileting    Toileting assist Assist for toileting: Moderate Assistance - Patient 50 - 74%     Transfers Chair/bed transfer  Transfers assist           Locomotion Ambulation   Ambulation assist              Walk 10 feet activity   Assist           Walk 50 feet activity   Assist           Walk 150 feet activity   Assist           Walk 10 feet on uneven surface  activity   Assist           Wheelchair     Assist               Wheelchair 50 feet with 2 turns activity    Assist            Wheelchair 150 feet activity     Assist  Blood pressure 138/60, pulse 97, temperature 98.3 F (36.8 C), resp. rate 17, height 5\' 4"  (1.626 m), weight 83.9 kg, SpO2 100 %.  Medical Problem List and Plan: 1.  Decreased functional mobility secondary to progressive bilateral lower extremity gangrenous changes with recent debridement and skin grafts.  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  Wound VAC as directed             -patient may not shower until wound VACs are off after 7 days, POD #5 today              -ELOS/Goals: 2 weeks- supervision to min A 2.  Antithrombotics: -DVT/anticoagulation: Eliquis             -antiplatelet therapy: Plavix 3. Pain Management: Pt emphatic doesn't want gabapentin- is willing to try Duloxetine- not allergic to it- will try 30 mg QHS x4 days then 60 mg QHS;  oxycodone as needed 4. Mood: Provide emotional support             -antipsychotic agents: N/A 5. Neuropsych: This patient is capable of making decisions on her own behalf. 6. Skin/Wound Care: Routine skin checks- has Stage I vs Stage II between  buttocks- have nurse reassess- pt couldn't hold position to assess- -wound VACs to be removed at 7 days- went on 10/6- to come off 10/13 7. Fluids/Electrolytes/Nutrition: Routine in and outs with follow-up chemistries 8.  Acute blood loss anemia.  Follow-up CBC 9.  Rheumatoid arthritis.  Chronic prednisone 10.  CAD status post cardiac catheterization August 2021 underwent rotational atherectomy assisted multivessel PCI.  Continue Plavix and Eliquis x12 months 11.  CKD stage IIIb.  Baseline creatinine 1.37 12.  Diastolic congestive heart failure.  Monitor for any signs of fluid overload.  Continue Bumex 2 mg daily 13.  Hypertension.  Lopressor 12.5 mg twice daily.  Monitor with increased mobility Vitals:   12/23/19 1948 12/24/19 0305  BP: 140/65 138/60  Pulse: (!) 103 97  Resp: 17 17  Temp: 98.3 F (36.8 C) 98.3 F (36.8 C)  SpO2: 99% 100%  controlled 10/11 14.  Diabetes mellitus TYPE I, not II with peripheral neuropathy.  Hemoglobin A1c 6.3.  Patient with insulin pump prior to admission.  Follow-up diabetic coordinator- will con't insulin pump, since pt has managed for >60 years.  CBG (last 3)  Recent Labs    12/23/19 1652 12/23/19 2350 12/24/19 0604  GLUCAP 74 88 166*  controlled 10/11  15. Constipation- 1st BM since admission was today- added Senokot 1 tab daily- also on Dulcolax pills- for 3 days; might need Sorbitol, etc?? 16. Urinary and bowel urgency- is chronic for pt- has bedpan right next to her on bed.     LOS: 1 days A FACE TO FACE EVALUATION WAS PERFORMED  02/23/20 12/24/2019, 8:18 AM

## 2019-12-24 NOTE — Progress Notes (Addendum)
Patient Details  Name: Truc Winfree MRN: [id number] Date of Birth: 03-14-1956  Today's Date: 12/24/2019  Hospital Problems: Principal Problem:   S/P bilateral below knee amputation [facility])  Past Medical History:  Past Medical History:  Diagnosis Date  . Arthritis, rheumatoid ([facility])   . CHF (congestive heart failure) ([facility])    pt denies this dx  . Coronary artery disease   . Diabetes mellitus without complication ([facility])   . Headache   . Hyperlipidemia   . Hypertension   . Latent tuberculosis    pt denies this dx  . Peripheral vascular disease ([facility])    blood clot in left leg  . Type 1 diabetes ([facility])    Past Surgical History:  Past Surgical History:  Procedure Laterality Date  . AMPUTATION Bilateral 12/19/2019   Procedure: BILATERAL BELOW KNEE AMPUTATION;  Surgeon: Nadara Mustard, MD;  Location: [facility] OR;  Service: Orthopedics;  Laterality: Bilateral;  . CARDIAC CATHETERIZATION  11/02/2019  . CARPAL TUNNEL RELEASE Bilateral   . EYE SURGERY Bilateral    09/2018  . FINGER SURGERY  07/11/2014   right middle finger - nodule removed  . FINGER SURGERY  02/20/2017   right middle finger  . FOOT SURGERY Right 02/26/2014   screw  . I & D EXTREMITY Right 08/15/2019   Procedure: RIGHT ACHILLES DEBRIDEMENT, POSSIBLE SKIN GRAFT;  Surgeon: Nadara Mustard, MD;  Location: [facility] OR;  Service: Orthopedics;  Laterality: Right;  . JOINT REPLACEMENT    . SHOULDER SURGERY Right 01/06/2005  . TRIGGER FINGER RELEASE  12/202002   left hand -   . WRIST SURGERY Left 10/23/2004   and middle finger  . WRIST SURGERY Right 11/05/2005  . WRIST SURGERY Right 03/11/2006   cyst removed   Social History:  reports that she has never smoked. She has never used smokeless tobacco. She reports that she does not drink alcohol and does not use drugs.  Family / Support Systems Marital Status: Married How Long?: 48 years Patient Roles: Spouse Spouse/Significant Other: Wayne Children: 1 son Anticipated Caregiver:  Wayne Ability/Limitations of Caregiver: yes, back issues (neck restrictions-25 lbs) Caregiver Availability: 24/7  Social History Preferred language: English Religion: Baptist Cultural Background: Baptist Education: Graduated high school Read: Yes Write: Yes Employment Status: Retired Date Retired/Disabled/Unemployed: pending (out on medical leave) Marine scientist Issues: no Guardian/Conservator: no   Abuse/Neglect Abuse/Neglect Assessment Can Be Completed: Yes Physical Abuse: Denies Verbal Abuse: Denies Sexual Abuse: Denies Exploitation of patient/patient's resources: Denies Self-Neglect: Denies  Emotional Status Pt's affect, behavior and adjustment status: Patient reports she has some emotional concerns, coping with the amuputation Recent Psychosocial Issues: no Psychiatric History: no Substance Abuse History: no  Patient / Family Perceptions, Expectations & Goals Pt/Family understanding of illness & functional limitations: Spouse states he is updated thus far and will await updates Premorbid pt/family roles/activities: Working/Pt did not leave home pt spouse completes all errands and task (only leave home for appintments) Anticipated changes in roles/activities/participation: Spouse will continue to asisst Pt/family expectations/goals: Goal to discharge Min A  [employer]: None Premorbid Home Care/DME Agencies: Other (Comment) (Has: Agricultural consultant, Wheelchair, 3N1 [employer], Information systems manager, Motorized Power wheelchair) Transportation available at discharge: Spouse able to [employer] referrals recommended: Neuropsychology  Discharge Planning Living Arrangements: Spouse/significant other Support Systems: Spouse/significant other Type of Residence: Private residence (1 Level, ramp entrance, no steps. Walk-in shower) Insurance Resources: Electrical engineer Resources: SSD Financial Screen Referred: No Living Expenses:  Mortgage Money Management: Spouse Does the  patient have any problems obtaining your medications?: No Care Coordinator Barriers to Discharge: Decreased caregiver support Care Coordinator Barriers to Discharge Comments: Spouse has back and neck restrictions Care Coordinator Anticipated Follow Up Needs: HH/OP Expected length of stay: 2 weeks  Clinical Impression Sw entered room, called spouse at bedside. Introduced self and primary sw, Auria, explained role, addressed questions and concerns. Patient stated she is having concerns of coping with her new amputation, added to neuro psych list.  Will continue to follow up with updates, questions and concerns.  Andria Rhein 12/24/2019, 12:54 PM

## 2019-12-25 ENCOUNTER — Inpatient Hospital Stay (HOSPITAL_COMMUNITY): Payer: Medicare Other

## 2019-12-25 ENCOUNTER — Inpatient Hospital Stay (HOSPITAL_COMMUNITY): Payer: Medicare Other | Admitting: Physical Therapy

## 2019-12-25 DIAGNOSIS — Z89512 Acquired absence of left leg below knee: Secondary | ICD-10-CM | POA: Diagnosis not present

## 2019-12-25 DIAGNOSIS — Z89511 Acquired absence of right leg below knee: Secondary | ICD-10-CM | POA: Diagnosis not present

## 2019-12-25 LAB — GLUCOSE, CAPILLARY
Glucose-Capillary: 324 mg/dL — ABNORMAL HIGH (ref 70–99)
Glucose-Capillary: 236 mg/dL — ABNORMAL HIGH (ref 70–99)
Glucose-Capillary: 40 mg/dL — CL (ref 70–99)
Glucose-Capillary: 78 mg/dL (ref 70–99)
Glucose-Capillary: 140 mg/dL — ABNORMAL HIGH (ref 70–99)
Glucose-Capillary: 128 mg/dL — ABNORMAL HIGH (ref 70–99)

## 2019-12-25 MED ORDER — INSULIN PUMP
Freq: Three times a day (TID) | SUBCUTANEOUS | Status: DC
  Administered 2019-12-28: 10 via SUBCUTANEOUS
  Administered 2019-12-28 (×2): 6 via SUBCUTANEOUS
  Administered 2019-12-29: 7 via SUBCUTANEOUS
  Administered 2019-12-29: 5 via SUBCUTANEOUS
  Administered 2019-12-29: 6 via SUBCUTANEOUS
  Administered 2019-12-30: 8 via SUBCUTANEOUS
  Administered 2019-12-30: 9 via SUBCUTANEOUS
  Administered 2020-01-02: 8 via SUBCUTANEOUS
  Administered 2020-01-03: 3 via SUBCUTANEOUS
  Administered 2020-01-04 (×2): 4 via SUBCUTANEOUS
  Administered 2020-01-05: 6 via SUBCUTANEOUS
  Administered 2020-01-05: 3 via SUBCUTANEOUS
  Filled 2019-12-25: qty 1

## 2019-12-25 NOTE — Progress Notes (Signed)
Physical Therapy Session Note  Patient Details  Name: Kaitlin Hudson MRN: [id number] Date of Birth: 1955-07-18  Today's Date: 12/25/2019 PT Individual Time: 1000-1050 and 1456-1540 PT Individual Time Calculation (min): 50 min and 44 min  Short Term Goals: Week 1:  PT Short Term Goal 1 (Week 1): Patient will perform bed mobility with supervision. PT Short Term Goal 2 (Week 1): Patient will perform pressure relief independently when seated in w/c. PT Short Term Goal 3 (Week 1): Patient will perform basic transfers with min A. PT Short Term Goal 4 (Week 1): Patient will propel w/c >100 feet with supervision.  Skilled Therapeutic Interventions/Progress Updates: Tx1: Pt presented in w/c agreeable to therapy. Pt unable to provide numerical assessment for pain but states it's "manageable" as pt recently received pain meds. Session focused on w/c mobility and transfers. PTA set up wound vacs onto pt's w/c and transported pt to day room. Pt participated in w/c mobility at supervision level using BUE. Due to decreased endurance pt required rest breaks but was able to propel ~165ft before requiring rest breaks. PTA provided gloves to increase tolerance of w/c propulsion. PTA transported pt back to room and pt indicated sensation for urinary void. Pt performed a-p transfer with CGA to drop arm commode and as pt performed lateral leans PTA assisted in lowering pants (+ void). Pt was also able to complete peri-care with supervision. Pt then performed lateral leans to allow PTA to pull up pants and pt performed posterior scoot to return to w/c with PTA stabilizing w/c. Pt then performed anterior scoot transfer to bed with minA from PTA and cues to increase anterior lean to facilitate scoot. Pt required minA with use of bed features to reposition in bed but was left resting comfortably with call bell within reach and needs met.   Tx2: Pt presented in w/c agreeable to therapy. Pt c/o pain in B residual limb requests  pain meds at end of session with nsg advised. Pt propelled to elevators with supervision for BUE strengthening and PTA transported pt remaining distance to ortho gym. Pt participated in Diagonal for BUE strengthening and endurance 2 min forward/backwards each at L4 with rest between bouts. Pt indicated increased difficulty backwards vs forwards. Pt then transported back to room and requested to use BSC prior to returning to bed. BSC set up for anterior scoot and pt was able to scoot with CGA and perform lateral leans to lower brief/shorts with CGA. Pt did required minA to completely clear shorts ( +urinary void). Pt then performed posterior scoot to return to w/c with CGA and PTA stabilizing w/c. Pt required intermittent rests due to B residual limb pain particularly when in dependent position. Pt then performed anterior transfer to bed close S with PTA stabilizing w/c. After rest pt participated in BLE therex as follows: GS, QS, hip adduction x 15 bilaterally, SLR x 10 bilaterally. Pt left in bed with bed alarm on, call bell within reach and needs met.      Therapy Documentation Precautions:  Precautions Precautions: Fall, Other (comment) Precaution Comments: x2 wound vacs Restrictions Weight Bearing Restrictions: Yes RLE Weight Bearing: Non weight bearing LLE Weight Bearing: Non weight bearing General:   Vital Signs: Therapy Vitals Pulse Rate: 99 Resp: 17 BP: 140/61 Patient Position (if appropriate): Sitting Oxygen Therapy SpO2: 99 % O2 Device: Room Air Pain: Pain Assessment Pain Scale: 0-10 Pain Score: 6  Pain Type: Surgical pain Pain Location: Leg Pain Orientation: Right;Left Pain Intervention(s): Medication (See eMAR) Mobility:  Locomotion :    Trunk/Postural Assessment :    Balance:   Exercises:   Other Treatments:      Therapy/Group: Individual Therapy  Derotha Fishbaugh 12/25/2019, 4:04 PM

## 2019-12-25 NOTE — Progress Notes (Signed)
Patient ID: Kaitlin Hudson, female   DOB: Aug 25, 1955, 64 y.o.   MRN: [id number]  SW met with pt and pt husband in room to provide updates from team conference, and d/c date 10/25. SW informed will continue to provide updates from team conference.   Loralee Pacas, MSW, [place] Office: [phone] Cell: [phone] Fax: [phone]

## 2019-12-25 NOTE — Plan of Care (Signed)
  Problem: RH BOWEL ELIMINATION Goal: RH STG MANAGE BOWEL WITH ASSISTANCE Description: STG Manage Bowel with Min Assistance. Outcome: Progressing   Problem: RH PAIN MANAGEMENT Goal: RH STG PAIN MANAGED AT OR BELOW PT'S PAIN GOAL Description: <3 on a 0-10 pain scale. Outcome: Progressing

## 2019-12-25 NOTE — Progress Notes (Signed)
Kaitlin Hudson PHYSICAL MEDICINE & REHABILITATION PROGRESS NOTE   Subjective/Complaints: Oxycodone helps pain. She has burning pain. She does not want Gabapentin as it made her husband sleepy. She had 2 BM last night.   ROS- neg CP, SOB, N/V/D  Objective:   No results found. Recent Labs    12/24/19 0620  WBC 8.8  HGB 9.9*  HCT 31.5*  PLT 466*   Recent Labs    12/24/19 0620  NA 138  K 4.3  CL 101  CO2 25  GLUCOSE 168*  BUN 23  CREATININE 1.22*  CALCIUM 8.8*    Intake/Output Summary (Last 24 hours) at 12/25/2019 0953 Last data filed at 12/24/2019 2015 Gross per 24 hour  Intake 320 ml  Output 325 ml  Net -5 ml        Physical Exam: Vital Signs Blood pressure 133/67, pulse (!) 101, temperature 98.2 F (36.8 C), resp. rate 17, height 5\' 4"  (1.626 m), weight 83.9 kg, SpO2 99 %.  General: Alert and oriented x 3, No apparent distress HEENT: Head is normocephalic, atraumatic, PERRLA, EOMI, sclera anicteric, oral mucosa pink and moist, dentition intact, ext ear canals clear,  Neck: Supple without JVD or lymphadenopathy Heart: Reg rate and rhythm. No murmurs rubs or gallops Chest: CTA bilaterally without wheezes, rales, or rhonchi; no distress Abdomen: Soft, non-tender, non-distended, bowel sounds positive. Extremities: No clubbing, cyanosis, or edema. Pulses are 2+ Skin: No evidence of breakdown, no evidence of rash Neurologic: Cranial nerves II through XII intact, motor strength is 5/5 in bilateral deltoid, bicep, tricep, grip, 4- hip flexor,, s/p B BKA Sensory exam normal sensation to light touch and proprioception in bilateral upper and lower extremities Cerebellar exam normal finger to nose to finger as well as heel to shin in bilateral upper and lower extremities Musculoskeletal: B BKA with wound vacs to incsions  Assessment/Plan: 1. Functional deficits secondary to B BKA which require 3+ hours per day of interdisciplinary therapy in a comprehensive inpatient  rehab setting.  Physiatrist is providing close team supervision and 24 hour management of active medical problems listed below.  Physiatrist and rehab team continue to assess barriers to discharge/monitor patient progress toward functional and medical goals  Care Tool:  Bathing    Body parts bathed by patient: Right arm, Left arm, Chest, Abdomen, Front perineal area, Buttocks, Right upper leg, Left upper leg, Face     Body parts n/a: Right lower leg, Left lower leg (B BKA)   Bathing assist Assist Level: Minimal Assistance - Patient > 75%     Upper Body Dressing/Undressing Upper body dressing   What is the patient wearing?: Pull over shirt, Bra    Upper body assist Assist Level: Supervision/Verbal cueing    Lower Body Dressing/Undressing Lower body dressing      What is the patient wearing?: Incontinence brief (Shorts.)     Lower body assist Assist for lower body dressing: Minimal Assistance - Patient > 75%     Toileting Toileting Toileting Activity did not occur Press photographer and hygiene only): N/A (no void or bm)  Toileting assist Assist for toileting: Minimal Assistance - Patient > 75%     Transfers Chair/bed transfer  Transfers assist     Chair/bed transfer assist level: Minimal Assistance - Patient > 75% (A/P transfer)     Locomotion Ambulation   Ambulation assist   Ambulation activity did not occur: Safety/medical concerns (New B BKA, B LEs NWB)          Walk  10 feet activity   Assist  Walk 10 feet activity did not occur: Safety/medical concerns        Walk 50 feet activity   Assist Walk 50 feet with 2 turns activity did not occur: Safety/medical concerns         Walk 150 feet activity   Assist Walk 150 feet activity did not occur: Safety/medical concerns         Walk 10 feet on uneven surface  activity   Assist Walk 10 feet on uneven surfaces activity did not occur: Safety/medical concerns          Wheelchair     Assist     Wheelchair activity did not occur: Safety/medical concerns (increased pain and anxiety during evalutaiton)         Wheelchair 50 feet with 2 turns activity    Assist    Wheelchair 50 feet with 2 turns activity did not occur: Safety/medical concerns       Wheelchair 150 feet activity     Assist  Wheelchair 150 feet activity did not occur: Safety/medical concerns       Blood pressure 133/67, pulse (!) 101, temperature 98.2 F (36.8 C), resp. rate 17, height 5\' 4"  (1.626 m), weight 83.9 kg, SpO2 99 %.  Medical Problem List and Plan: 1.  Decreased functional mobility secondary to progressive bilateral lower extremity gangrenous changes with recent debridement and skin grafts.  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  Wound VAC as directed             -patient may not shower until wound VACs are off after 7 days, POD #5 today              -ELOS/Goals: 2 weeks- supervision to min A  -Interdisciplinary Team Conference today   2.  Antithrombotics: -DVT/anticoagulation: Eliquis             -antiplatelet therapy: Plavix 3. Pain Management: Pt emphatic doesn't want gabapentin- is willing to try Duloxetine- not allergic to it- will try 30 mg QHS x4 days then 60 mg QHS;  oxycodone as needed  10/12: continues to have that burning pain.  4. Mood: Provide emotional support             -antipsychotic agents: N/A 5. Neuropsych: This patient is capable of making decisions on her own behalf. 6. Skin/Wound Care: Routine skin checks- has Stage I vs Stage II between buttocks- have nurse reassess- pt couldn't hold position to assess- -wound VACs to be removed at 7 days- went on 10/6- to come off 10/13 7. Fluids/Electrolytes/Nutrition: Routine in and outs with follow-up chemistries 8.  Acute blood loss anemia.  Follow-up CBC 9.  Rheumatoid arthritis.  Chronic prednisone 10.  CAD status post cardiac catheterization August 2021 underwent rotational atherectomy  assisted multivessel PCI.  Continue Plavix and Eliquis x12 months 11.  CKD stage IIIb.  Baseline creatinine 1.37 12.  Diastolic congestive heart failure.  Monitor for any signs of fluid overload.  Continue Bumex 2 mg daily 13.  Hypertension.  Lopressor 12.5 mg twice daily.  Monitor with increased mobility Vitals:   12/24/19 1955 12/25/19 0356  BP: 129/67 133/67  Pulse: 100 (!) 101  Resp: 16 17  Temp: 98.2 F (36.8 C) 98.2 F (36.8 C)  SpO2: 97% 99%  controlled 10/12.  14.  Diabetes mellitus TYPE I, not II with peripheral neuropathy.  Hemoglobin A1c 6.3.  Patient with insulin pump prior to admission.  Follow-up diabetic coordinator- will con't insulin  pump, since pt has managed for >60 years.  CBG (last 3)  Recent Labs    12/24/19 2156 12/25/19 0600 12/25/19 0743  GLUCAP 78 324* 236*  10/12: uncontrolled this morning to 236. 42 on 10/11 evening. Discussed her insulin pump. She will take 2U less than before.  15. Constipation- 1st BM since admission was today- added Senokot 1 tab daily- also on Dulcolax pills- for 3 days; might need Sorbitol, etc?? 16. Urinary and bowel urgency- is chronic for pt- has bedpan right next to her on bed.     LOS: 2 days A FACE TO FACE EVALUATION WAS PERFORMED  Drema Pry Juron Vorhees 12/25/2019, 9:53 AM

## 2019-12-25 NOTE — Progress Notes (Signed)
PT fsbs 40. Per pt she needs to adjust insulin due to increased activity. Protein/carb snack given.

## 2019-12-25 NOTE — Progress Notes (Addendum)
Inpatient Diabetes Program Recommendations  AACE/ADA: New Consensus Statement on Inpatient Glycemic Control (2015)  Target Ranges:  Prepandial:   less than 140 mg/dL      Peak postprandial:   less than 180 mg/dL (1-2 hours)      Critically ill patients:  140 - 180 mg/dL   Lab Results  Component Value Date   GLUCAP 78 12/25/2019   HGBA1C 6.3 (H) 12/19/2019    Review of Glycemic Control Results for Kaitlin Hudson, Kaitlin Hudson (MRN [id number]) as of 12/25/2019 14:43  Ref. Range 12/24/2019 21:37 12/24/2019 21:56 12/25/2019 06:00 12/25/2019 07:43 12/25/2019 11:06 12/25/2019 11:34  Glucose-Capillary Latest Ref Range: 70 - 99 mg/dL 42 (LL) 78 993 (H) 716 (H) 40 (LL) 78   Diabetes history:  Type 1 Diabetes Outpatient Diabetes medications:  Novolog via [employer] Insulin pump-33.54 basal/24 hr Corrects and gives meal coverage based on blood sugar and CHO intake Current orders for Inpatient glycemic control:  Insulin pump  Inpatient Diabetes Program Recommendations:    Having low CBG's into the 40's at times.  Spoke with patient and husband at bedside.  Patient states she believes she is getting low because she is not eating as well as usual and because of PT.  She has a call in to Dr. De Blanch endocrinologist.  Explained that typically with DM1 while in the hospital basal rates are cut back by 20%.  She verbalizes understanding and is waiting on a call back from Dr. Allena Katz.    Please check CBG's AC/HS and at 2am per insulin pump order set  Will continue to follow while inpatient.  Thank you, Dulce Sellar, RN, BSN Diabetes Coordinator Inpatient Diabetes Program [phone] (team pager from 8a-5p)

## 2019-12-25 NOTE — Discharge Instructions (Addendum)
Inpatient Rehab Discharge Instructions  Kaitlin Hudson Discharge date and time: No discharge date for patient encounter.   Activities/Precautions/ Functional Status: Activity: activity as tolerated Diet: diabetic diet Wound Care: Stump care antibacterial soap/dial and water daily Functional status:  ___ No restrictions     ___ Walk up steps independently ___ 24/7 supervision/assistance   ___ Walk up steps with assistance ___ Intermittent supervision/assistance  ___ Bathe/dress independently ___ Walk with walker     _x__ Bathe/dress with assistance ___ Walk Independently    ___ Shower independently ___ Walk with assistance    ___ Shower with assistance ___ No alcohol     ___ Return to work/school ________  COMMUNITY REFERRALS UPON DISCHARGE:    Home Health:   PT     OT       RN                  Agency:[facility]  Phone: [phone] *Please expect follow-up within 2-3 days from discharge to schedule your home visit. If you have not received follow-up, be sure to contact the agency directly.*   Medical Equipment/Items Ordered:slide board                                                 Agency/Supplier: [facility] [phone]    Special Instructions: No driving smoking or alcohol   My questions have been answered and I understand these instructions. I will adhere to these goals and the provided educational materials after my discharge from the hospital.  Patient/Caregiver Signature _______________________________ Date __________  Clinician Signature _______________________________________ Date __________  Please bring this form and your medication list with you to all your follow-up doctor's appointments.    =====================================================================================================  Information on my medicine - ELIQUIS (apixaban)  Why was Eliquis prescribed for you? Eliquis was prescribed for you to reduce the risk of forming blood  clots or blockages in the blood vessels in the heart.  What do You need to know about Eliquis ? Take your Eliquis TWICE DAILY - one tablet in the morning and one tablet in the evening with or without food.  It would be best to take the doses about the same time each day.  If you have difficulty swallowing the tablet whole please discuss with your pharmacist how to take the medication safely.  Take Eliquis exactly as prescribed by your doctor and DO NOT stop taking Eliquis without talking to the doctor who prescribed the medication.  Stopping may increase your risk of developing a new clot or stroke.  Refill your prescription before you run out.  After discharge, you should have regular check-up appointments with your healthcare provider that is prescribing your Eliquis.  In the future your dose may need to be changed if your kidney function or weight changes by a significant amount or as you get older.  What do you do if you miss a dose? If you miss a dose, take it as soon as you remember on the same day and resume taking twice daily.  Do not take more than one dose of ELIQUIS at the same time.  Important Safety Information A possible side effect of Eliquis is bleeding. You should call your healthcare provider right away if you experience any of the following: ? Bleeding from an injury or your nose that does not stop. ? Unusual colored  urine (red or dark brown) or unusual colored stools (red or black). ? Unusual bruising for unknown reasons. ? A serious fall or if you hit your head (even if there is no bleeding).  Some medicines may interact with Eliquis and might increase your risk of bleeding or clotting while on Eliquis. To help avoid this, consult your healthcare provider or pharmacist prior to using any new prescription or non-prescription medications, including herbals, vitamins, non-steroidal anti-inflammatory drugs (NSAIDs) and supplements.  This website has more information on  Eliquis (apixaban): [email or web address].com.cy.

## 2019-12-25 NOTE — Progress Notes (Signed)
Therapy reported that pt sustained a skin tear to her right elbow during transfer. Area cleaned and dressing applied. Elbow protectors obtained for pt.

## 2019-12-25 NOTE — Progress Notes (Signed)
Physical Therapy Session Note  Patient Details  Name: Kaitlin Hudson MRN: [id number] Date of Birth: [birth year]  Today's Date: 12/25/2019 PT Individual Time: 0800-0900 PT Individual Time Calculation (min): 60 min   Short Term Goals: Week 1:  PT Short Term Goal 1 (Week 1): Patient will perform bed mobility with supervision. PT Short Term Goal 2 (Week 1): Patient will perform pressure relief independently when seated in w/c. PT Short Term Goal 3 (Week 1): Patient will perform basic transfers with min A. PT Short Term Goal 4 (Week 1): Patient will propel w/c >100 feet with supervision.  Skilled Therapeutic Interventions/Progress Updates:     Patient in bed upon PT arrival. Patient alert and agreeable to PT session. Patient reported 4-6/10 B residual limb pain during session, RN made aware. PT provided repositioning, rest breaks, and distraction as pain interventions throughout session.   Patient had a small skin tear on her elbow during transfer to [facility] during session. Small foam dressing applied for added protection and infection prevention, RN made aware and discussed ordering protective sleeves for UEs due to fragile skin and frequent use of UEs and elbows for functional mobility.   Therapeutic Activity: Bed Mobility: Patient performed rolling R/L with supervision and use of bed rails to don shorts with min A for pulling pants up in the back. Educated patient on appropriately disconnecting her wound vacs to thread lines through her shorts for increased independence with dressing in the bed. Educated on only blocking the line or disconnecting the lines from the wound vacs for very short periods and double checking that the line is locked in and no longer blocked when plugging them back in. Patient stated understanding and followed PTs demonstration without cues. She performed supine to/from sit with supervision with intermittent use of the bed rails x3 during session. Provided verbal cues for  bringing opposite LE across her body for improved rolling and reducing use of bed rails to simulate home set up.  Transfers: Patient performed A/P transfers from bed<>BSC with min A-CGA and PT stabilizing BSC througout, scooting forward onto the bariatric BSC and backwards onto the bed for improved anxiety with mobility. She then performed an A/P transfer into the w/c with min A-CGA, requiring increased assist at junction where the bed meets the w/c due to decreased UE strength. Provided cues for head-hips relationship for reciprocal scooting technique, increased clearance of residual limbs to protect incision site, and hand placement for improved hip clearance   Wheelchair Mobility:  Patient educated on use of breaks, turning technique, and purpose of limb pads to promote B knee extension in sitting and reduced risk of contractures.   Educated patient on contracture prevention and ROM, therapy progression, expectations for mobility at d/c from CIR and throughout continuum of care, shinker purpose and donning/doffing technique, shrinker hygiene, shaping, desensitization, and energy conservation techniques with increased energy expenditure s/p B BKAs. During education, doffed shrinkers with total A, wound vacs in place, applied new shrinkers with total A and washed dirty shrinkers with soap and water, left hanging to dry in the bathroom.   Patient in w/c at end of session with breaks locked and all needs within reach.    Therapy Documentation Precautions:  Precautions Precautions: Fall, Other (comment) Precaution Comments: x2 wound vacs Restrictions Weight Bearing Restrictions: Yes RLE Weight Bearing: Non weight bearing LLE Weight Bearing: Non weight bearing   Therapy/Group: Individual Therapy  Josiane Labine L Danzig Macgregor PT, DPT  12/25/2019, 9:41 AM

## 2019-12-25 NOTE — Progress Notes (Signed)
Pt fsbs recheck 78. Encouraged high protein intake with carbs.

## 2019-12-25 NOTE — Patient Care Conference (Signed)
Inpatient RehabilitationTeam Conference and Plan of Care Update Date: 12/25/2019   Time: 10:17 AM    Patient Name: Kaitlin Hudson      Medical Record Number: [id number]  Date of Birth: 1955/12/21 Sex: Female         Room/Bed: 4W19C/4W19C-01 Payor Info: Payor: MEDICARE / Plan: MEDICARE PART A AND B / Product Type: *No Product type* /    Admit Date/Time:  12/23/2019 11:51 AM  Primary Diagnosis:  S/P bilateral below knee amputation [facility])  Hospital Problems: Principal Problem:   S/P bilateral below knee amputation [facility])    Expected Discharge Date: Expected Discharge Date: 01/07/20  Team Members Present: Physician leading conference: Dr. Sula Soda Care Coodinator Present: Cecile Sheerer, LCSWA;Teng Decou Marlyne Beards, RN, BSN, CRRN Nurse Present: Other (comment) Tinnie Gens, RN) PT Present: Serina Cowper, PT OT Present: Jake Shark, OT PPS Coordinator present : Edson Snowball, Park Breed, SLP     Current Status/Progress Goal Weekly Team Focus  Bowel/Bladder   Pt is continent of bowel/bladder.  To remain continent of bowel/bladder.  Assess tolieting needs prn   Swallow/Nutrition/ Hydration             ADL's   mod A toileting and LB dressing, (S) UB ADLs, min A transfers  (S) to mod I  ADL retraining, transfers, anxiety/psychosocial adjustment   Mobility   Min A bed mobility, mod A transfers, limited by residual limb pain and anxiety  Mod I w/c and bed mobility, supervision-min A transfers  Activity tolerance, strength/ROM, sitting balance, functional transfers, w/c mobility, amputee education, patient/caregiver education   Communication             Safety/Cognition/ Behavioral Observations            Pain   Pain level around 4-6/10. Gets prn oxy q4 if needed.  To decrease pain level 2/10.  Assess pain q shift or prn.   Skin   Has pressure injury to sacrum w/ foam over. Surgical incisions to bilateral BKA sites w/ wound vacs.  Promote healing and preventing  further breakdown.  Assess skin q shift or prn.     Discharge Planning:      Team Discussion: Continent B/B, refused AM bowel meds this morning, MD aware. Mild skin tear to elbow, foam and elbow protector applied. Mod assist for lower body ADL's, Supervision for upper body ADL's. AP transfers limited by pain. Supervision to min assist goals. Has W/C and Power Chair at home. Patient had low blood sugar this morning and MD is aware. Patient will adjust her insulin pump.  Patient on target to meet rehab goals: yes  *See Care Plan and progress notes for long and short-term goals.   Revisions to Treatment Plan:  Not at this time.  Teaching Needs: Begin family education when appropriate.  Current Barriers to Discharge: Wound care, Weight bearing restrictions, Behavior and pain.  Possible Resolutions to Barriers: Teach wound care and dressing changes when appropriate, teach weight bearing precautions, continue current medication regimen.     Medical Summary Current Status: Tachycardic, hypoglycemic to 42 last night and then elevated to 300s after snack, burning pain in bilateral residual limbs  Barriers to Discharge: Medical stability  Barriers to Discharge Comments: Tachycardic, hypoglycemic to 42 last night and then elevated to 300s after snack, burning pain in bilateral residual limbs Possible Resolutions to [employer] Focus: Neuropsych eval, monitor HR TID, discussed decreasing 4U on insulin pump to prevent such severe hypoglycemia, discussing Cymbalta for neuropathic pain   Continued Need  for Acute Rehabilitation Level of Care: The patient requires daily medical management by a physician with specialized training in physical medicine and rehabilitation for the following reasons: Direction of a multidisciplinary physical rehabilitation program to maximize functional independence : Yes Medical management of patient stability for increased activity during participation in an intensive  rehabilitation regime.: Yes Analysis of laboratory values and/or radiology reports with any subsequent need for medication adjustment and/or medical intervention. : Yes   I attest that I was present, lead the team conference, and concur with the assessment and plan of the team.   Tennis Must 12/25/2019, 2:07 PM

## 2019-12-25 NOTE — Progress Notes (Signed)
Occupational Therapy Session Note  Patient Details  Name: Kaitlin Hudson MRN: [id number] Date of Birth: [birth year]  Today's Date: 12/25/2019 OT Individual Time: 1300-1400 OT Individual Time Calculation (min): 60 min    Short Term Goals: Week 1:  OT Short Term Goal 1 (Week 1): Pt will complete BSC transfer with min A OT Short Term Goal 2 (Week 1): Pt will complete toileting tasks with min A OT Short Term Goal 3 (Week 1): Pt will don LB clothing with CGA OT Short Term Goal 4 (Week 1): Pt will correctly position BLE in bed with no more than min cueing  Skilled Therapeutic Interventions/Progress Updates:    Pt received supine with no c/o pain, husband present. Pt completed posterior transfer to w/c with CGA and assist to manage wound vac lines and support w/c from sliding. Pt anxiety improved significantly this session. Pt was provided with elbow protectors 2/2 thin skin easily torn with any shearing during transfers. Pt propelled w/c 125 ft before fatiguing and requiring assist to continue. Mod cueing provided for w/c management and technique. Pt was taken to the ADL apt where extensive discussion/problem solving was completed with her and her husband on accessibility. Recommended several options of decreasing bed height to ensure accessible transfer. Discussed slideboard vs A/P transfer. Gave demonstration re walk in shower transfer and shower chair vs. BSC vs. TTB. Pt and husband actively involved. Pt returned to her room and was left sitting up with all needs met, husband present.   Therapy Documentation Precautions:  Precautions Precautions: Fall, Other (comment) Precaution Comments: x2 wound vacs Restrictions Weight Bearing Restrictions: Yes RLE Weight Bearing: Non weight bearing LLE Weight Bearing: Non weight bearing   Therapy/Group: Individual Therapy  Curtis Sites 12/25/2019, 6:41 AM

## 2019-12-25 NOTE — Significant Event (Signed)
Hypoglycemic Event  CBG: 40  Treatment: 4 oz juice/soda  Symptoms: None  Follow-up CBG: Time:1134 CBG Result:78  Possible Reasons for Event: Medication regimen: Patient will need to adjust her basal rate in her insulin pump.  Comments/MD notified: Dr. Dalene Carrow aware, patient given 15 gr. Snack and 4 oz juice.    Marlyne Beards, Suzi Roots

## 2019-12-25 NOTE — Progress Notes (Signed)
Dr Carlis Abbott updated on pt fsbs highs and lows. Pt states she will get basal rate adjusted.

## 2019-12-26 ENCOUNTER — Inpatient Hospital Stay (HOSPITAL_COMMUNITY): Payer: Medicare Other

## 2019-12-26 DIAGNOSIS — Z89511 Acquired absence of right leg below knee: Secondary | ICD-10-CM | POA: Diagnosis not present

## 2019-12-26 DIAGNOSIS — Z89512 Acquired absence of left leg below knee: Secondary | ICD-10-CM | POA: Diagnosis not present

## 2019-12-26 LAB — GLUCOSE, CAPILLARY
Glucose-Capillary: 182 mg/dL — ABNORMAL HIGH (ref 70–99)
Glucose-Capillary: 111 mg/dL — ABNORMAL HIGH (ref 70–99)
Glucose-Capillary: 147 mg/dL — ABNORMAL HIGH (ref 70–99)
Glucose-Capillary: 197 mg/dL — ABNORMAL HIGH (ref 70–99)
Glucose-Capillary: 173 mg/dL — ABNORMAL HIGH (ref 70–99)

## 2019-12-26 NOTE — Progress Notes (Addendum)
[facility] PHYSICAL MEDICINE & REHABILITATION PROGRESS NOTE   Subjective/Complaints: As per Kaitlin Hudson OT, pain has been limiting sessions. Discussed Cymbalta with patient- it is ordered but she has been refusing. Discussed that most common side effect I have seen in nausea in small amount but she is hesitant to try.  ROS- denies CP, SOB, N/V/D  Objective:   No results found. Recent Labs    12/24/19 0620  WBC 8.8  HGB 9.9*  HCT 31.5*  PLT 466*   Recent Labs    12/24/19 0620  NA 138  K 4.3  CL 101  CO2 25  GLUCOSE 168*  BUN 23  CREATININE 1.22*  CALCIUM 8.8*    Intake/Output Summary (Last 24 hours) at 12/26/2019 1228 Last data filed at 12/26/2019 0209 Gross per 24 hour  Intake 480 ml  Output 25 ml  Net 455 ml        Physical Exam: Vital Signs Blood pressure 134/60, pulse 91, temperature 98.6 F (37 C), resp. rate 17, height 5\' 4"  (1.626 m), weight 83.9 kg, SpO2 99 %. General: Alert and oriented x 3, No apparent distress HEENT: Head is normocephalic, atraumatic, PERRLA, EOMI, sclera anicteric, oral mucosa pink and moist, dentition intact, ext ear canals clear,  Neck: Supple without JVD or lymphadenopathy Heart: Reg rate and rhythm. No murmurs rubs or gallops Chest: CTA bilaterally without wheezes, rales, or rhonchi; no distress Abdomen: Soft, non-tender, non-distended, bowel sounds positive. Extremities: No clubbing, cyanosis, or edema. Pulses are 2+ Skin: No evidence of breakdown, no evidence of rash Neurologic: Cranial nerves II through XII intact, motor strength is 5/5 in bilateral deltoid, bicep, tricep, grip, 4- hip flexor,, s/p B BKA Sensory exam normal sensation to light touch and proprioception in bilateral upper and lower extremities Cerebellar exam normal finger to nose to finger as well as heel to shin in bilateral upper and lower extremities Musculoskeletal: B BKA with wound vacs to incisions   Assessment/Plan: 1. Functional deficits secondary to  B BKA which require 3+ hours per day of interdisciplinary therapy in a comprehensive inpatient rehab setting.  Physiatrist is providing close team supervision and 24 hour management of active medical problems listed below.  Physiatrist and rehab team continue to assess barriers to discharge/monitor patient progress toward functional and medical goals  Care Tool:  Bathing    Body parts bathed by patient: Right arm, Left arm, Chest, Abdomen, Front perineal area, Buttocks, Right upper leg, Left upper leg, Face     Body parts n/a: Right lower leg, Left lower leg (B BKA)   Bathing assist Assist Level: Minimal Assistance - Patient > 75%     Upper Body Dressing/Undressing Upper body dressing   What is the patient wearing?: Pull over shirt, Bra    Upper body assist Assist Level: Supervision/Verbal cueing    Lower Body Dressing/Undressing Lower body dressing      What is the patient wearing?: Incontinence brief (Shorts.)     Lower body assist Assist for lower body dressing: Minimal Assistance - Patient > 75%     Toileting Toileting Toileting Activity did not occur and hygiene only): N/A (no void or bm)  Toileting assist Assist for toileting: Minimal Assistance - Patient > 75%     Transfers Chair/bed transfer  Transfers assist     Chair/bed transfer assist level: Minimal Assistance - Patient > 75% (A/P transfer)     Locomotion Ambulation   Ambulation assist   Ambulation activity did not occur: Safety/medical concerns (New  B BKA, B LEs NWB)          Walk 10 feet activity   Assist  Walk 10 feet activity did not occur: Safety/medical concerns        Walk 50 feet activity   Assist Walk 50 feet with 2 turns activity did not occur: Safety/medical concerns         Walk 150 feet activity   Assist Walk 150 feet activity did not occur: Safety/medical concerns         Walk 10 feet on uneven surface  activity   Assist Walk 10  feet on uneven surfaces activity did not occur: Safety/medical concerns         Wheelchair     Assist Will patient use wheelchair at discharge?: Yes Type of Wheelchair: Power Wheelchair activity did not occur: Safety/medical concerns (increased pain and anxiety during evalutaiton)         Wheelchair 50 feet with 2 turns activity    Assist    Wheelchair 50 feet with 2 turns activity did not occur: Safety/medical concerns   Assist Level: Supervision/Verbal cueing   Wheelchair 150 feet activity     Assist  Wheelchair 150 feet activity did not occur: Safety/medical concerns       Blood pressure 134/60, pulse 91, temperature 98.6 F (37 C), resp. rate 17, height 5\' 4"  (1.626 m), weight 83.9 kg, SpO2 99 %.  Medical Problem List and Plan: 1.  Decreased functional mobility secondary to progressive bilateral lower extremity gangrenous changes with recent debridement and skin grafts.  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  Wound VAC as directed             -patient may not shower until wound VACs are off after 7 days, DC 10/13             -ELOS/Goals: 2 weeks- supervision to min A  -Continue CIR 2.  Antithrombotics: -DVT/anticoagulation: Eliquis             -antiplatelet therapy: Plavix 3. Pain Management: Pt emphatic doesn't want gabapentin- is willing to try Duloxetine- not allergic to it- will try 30 mg QHS x4 days then 60 mg QHS;  oxycodone as needed  10/12: continues to have that burning pain.   10/13: discussed pros and cons of Cymbalta- she prefers to tolerate pain without medication at this time but would like to have it ordered if needed.  4. Mood: Provide emotional support             -antipsychotic agents: N/A 5. Neuropsych: This patient is capable of making decisions on her own behalf. 6. Skin/Wound Care: Routine skin checks- has Stage I vs Stage II between buttocks- have nurse reassess- pt couldn't hold position to assess- -wound VACs to be removed at 7  days- went on 10/6- to come off 10/13 7. Fluids/Electrolytes/Nutrition: Routine in and outs with follow-up chemistries 8.  Acute blood loss anemia.  Hgb up to 9.9 on 10/11 9.  Rheumatoid arthritis.  Chronic prednisone 10.  CAD status post cardiac catheterization August 2021 underwent rotational atherectomy assisted multivessel PCI.  Continue Plavix and Eliquis x12 months 11.  CKD stage IIIb.  Baseline creatinine 1.37 12.  Diastolic congestive heart failure.  Monitor for any signs of fluid overload.  Continue Bumex 2 mg daily 13.  Hypertension.  Lopressor 12.5 mg twice daily.  Monitor with increased mobility Vitals:   12/25/19 1929 12/26/19 0600  BP: (!) 148/58 134/60  Pulse: (!) 105 91  Resp:  18 17  Temp: 98.6 F (37 C) 98.6 F (37 C)  SpO2: 97% 99%  controlled 10/12.  14.  Diabetes mellitus TYPE I, not II with peripheral neuropathy.  Hemoglobin A1c 6.3.  Patient with insulin pump prior to admission.  Follow-up diabetic coordinator- will con't insulin pump, since pt has managed for >60 years.  CBG (last 3)  Recent Labs    12/26/19 0202 12/26/19 0601 12/26/19 1146  GLUCAP 182* 111* 147*  10/12: uncontrolled this morning to 236. 42 on 10/11 evening. Discussed her insulin pump. She will take 2U less than before.   10/13: she discussed with her endocroniologist and they have decreased her basal rate by 20% with good results.  15. Constipation- 1st BM since admission was today- added Senokot 1 tab daily- also on Dulcolax pills- for 3 days; might need Sorbitol, etc?? 16. Urinary and bowel urgency- is chronic for pt- has bedpan right next to her on bed.     LOS: 3 days A FACE TO FACE EVALUATION WAS PERFORMED  Kaitlin Hudson 12/26/2019, 12:28 PM

## 2019-12-26 NOTE — Progress Notes (Signed)
Occupational Therapy Session Note  Patient Details  Name: Kaitlin Hudson MRN: [id number] Date of Birth: 08/18/1955  Today's Date: 12/26/2019 OT Individual Time: 8657-8469 OT Individual Time Calculation (min): 70 min    Short Term Goals: Week 1:  OT Short Term Goal 1 (Week 1): Pt will complete BSC transfer with min A OT Short Term Goal 2 (Week 1): Pt will complete toileting tasks with min A OT Short Term Goal 3 (Week 1): Pt will don LB clothing with CGA OT Short Term Goal 4 (Week 1): Pt will correctly position BLE in bed with no more than min cueing  Skilled Therapeutic Interventions/Progress Updates:    1:1. Pt received in bed agreeable to bathing and dressing. Pt reporting pain is OK, however during transfer pt becomes very tearful and emotional support/education about timing of pain medication as well as integrative pain management strategies provided. Pt completes EOB bathing of UB with set up. Pt reporting need to toilet and completes EOB<>bariatric BSC/w/c transfers with CGA via A/P method. Pt able to void bladder after total A for CM. Pt able to wipe and wash peri/buttock area after bladder void. Increased time required for support and encouragement d/t pt feeling, "overwhelmed" and tearful. Pt dons pants with A to thread BLE into pants/wound vac management and pt advances pants past hips in supine via rolling. Exited session with pt setaed in w/c, call light in reach and pt reporting will call prior to mobility  Therapy Documentation Precautions:  Precautions Precautions: Fall, Other (comment) Precaution Comments: x2 wound vacs Restrictions Weight Bearing Restrictions: Yes RLE Weight Bearing: Non weight bearing LLE Weight Bearing: Non weight bearing General:   Vital Signs: Therapy Vitals Temp: 98.6 F (37 C) Pulse Rate: 91 Resp: 17 BP: 134/60 Patient Position (if appropriate): Sitting Oxygen Therapy SpO2: 99 % O2 Device: Room Air Pain:   ADL: ADL Eating: Set up Where  Assessed-Eating: Bed level Grooming: Supervision/safety Where Assessed-Grooming: Bed level Upper Body Bathing: Supervision/safety Where Assessed-Upper Body Bathing: Bed level Lower Body Bathing: Minimal assistance Where Assessed-Lower Body Bathing: Bed level Upper Body Dressing: Supervision/safety Where Assessed-Upper Body Dressing: Bed level Lower Body Dressing: Moderate assistance Where Assessed-Lower Body Dressing: Bed level Toileting: Moderate assistance Where Assessed-Toileting: Wheelchair (using urinal) Toilet Transfer: Unable to assess Tub/Shower Transfer: Unable to assess Tub/Shower Transfer Method: Unable to assess [employer] Transfer: Unable to assess [employer] Transfer Method: Unable to assess Vision   Perception    Praxis   Exercises:   Other Treatments:     Therapy/Group: Individual Therapy  Shon Hale 12/26/2019, 8:28 AM

## 2019-12-26 NOTE — Progress Notes (Signed)
Order received to remove bilateral wounds vacs. Vacs removed. Right incision is CDI, staples in place, Dry nonstick dressing placed. Shrinker over dressing. Left incision CDI, staples in place, dry dressing, stump shrinker in place

## 2019-12-26 NOTE — Progress Notes (Signed)
Physical Therapy Session Note  Patient Details  Name: Kaitlin Hudson MRN: [id number] Date of Birth: 05/13/1955  Today's Date: 12/26/2019 PT Individual Time: 1015-1130 PT Individual Time Calculation (min): 75 min   Short Term Goals: Week 1:  PT Short Term Goal 1 (Week 1): Patient will perform bed mobility with supervision. PT Short Term Goal 2 (Week 1): Patient will perform pressure relief independently when seated in w/c. PT Short Term Goal 3 (Week 1): Patient will perform basic transfers with min A. PT Short Term Goal 4 (Week 1): Patient will propel w/c >100 feet with supervision.    Skilled Therapeutic Interventions/Progress Updates:    PAIN  C/o "soreness" residual limbs, not quantified, care taken w/mobility, repositioned as needed during session.    Pt initially oob in wc, agreeable to working in PT this am.   WC propulsion x 155ft w/bilat UEs, increased overall width of wc decreases efficiency of propulsion mechanics.  Discussed changing to more narrow wc.  Pt explained that wc at home was recently ordered thru MD office and is actually as wide if not wider than this wc Pt also has a Pharmacist, hospital chair that was just purchased.  Transfer training: Pt very anxious about practicing SBT stating that she was "terrified of using the sliding board". Therapist demonstrated sequencing/technique and discussed importance of set up/board, discussed mechanica and importance of not leaning posteriorly resulting in sliding.  Pt stated that this was what she did w/first effort and why she was so fearful.  Assured pt that we would move slowly and that therapist would endsure safety/mechanics w/transfer.  Pt agreed to attempt.  wc to mat w/set up, verbal and tactile cues, min assist SBT Mat to wc w/set up, cues, cga wc to mat w/set up, cues, cga All transfers require additional time and assurance due to anxiety  Attempted UE strengthening via boosting w/push up blocks but pt unable to tolerate  due to RA changes in wrists Sit to supine w/wedge w/cga. Supine UE strengthening: Chest press w/5lb bar 2x12 flys w/3lb wt 2x10 w/therapist assisting to secure grip TKEs 2x10 each   Supine to sit w/cga.   SBT to wc w/set up, cues, cga, additional assurance due to anxiety.  wc propulsion x 126ft w/bilat UEs, additional time.  wc to recliner - educated re sequencing/technique via sbt/drop arm recliner.  Pt performed w/mod assist and additional time and assurance due to pt panicking during transfer and lunging upper body into recliner, therapist having to calm pt, guide back to upright, and complete transfer w/safe technique.  Following transfer, emotional support provided and therapist reviewed what went wrong w/pt, demonstrated result of lunging upper body into recliner and need for return to upright to complet transfer safely.  Pt left oob in recliner w/chair alarm set and needs in reach.    Therapy Documentation Precautions:  Precautions Precautions: Fall, Other (comment) Precaution Comments: x2 wound vacs Restrictions Weight Bearing Restrictions: Yes RLE Weight Bearing: Non weight bearing LLE Weight Bearing: Non weight bearing  Therapy/Group: Individual Therapy  Rada Hay, PT   [name] 12/26/2019, 11:59 AM

## 2019-12-26 NOTE — Evaluation (Addendum)
Recreational Therapy Assessment and Plan  Patient Details  Name: Kaitlin Hudson MRN: [id number] Date of Birth: 1955/03/24 Today's Date: 12/26/2019  Rehab Potential:  Good ELOS:   d/c 10/25 Assessment  Hospital Problem: Principal Problem:   S/P bilateral below knee amputation [facility])   Past Medical History:      Past Medical History:  Diagnosis Date  . Arthritis, rheumatoid ([place])   . CHF (congestive heart failure) ([place])    pt denies this dx  . Coronary artery disease   . Diabetes mellitus without complication ([place])   . Headache   . Hyperlipidemia   . Hypertension   . Latent tuberculosis    pt denies this dx  . Peripheral vascular disease ([place])    blood clot in left leg  . Type 1 diabetes ([place])    Past Surgical History:       Past Surgical History:  Procedure Laterality Date  . AMPUTATION Bilateral 12/19/2019   Procedure: BILATERAL BELOW KNEE AMPUTATION;  Surgeon: Newt Minion, MD;  Location: [place];  Service: Orthopedics;  Laterality: Bilateral;  . CARDIAC CATHETERIZATION  11/02/2019  . CARPAL TUNNEL RELEASE Bilateral   . EYE SURGERY Bilateral    09/2018  . FINGER SURGERY  07/11/2014   right middle finger - nodule removed  . FINGER SURGERY  02/20/2017   right middle finger  . FOOT SURGERY Right 02/26/2014   screw  . I & D EXTREMITY Right 08/15/2019   Procedure: RIGHT ACHILLES DEBRIDEMENT, POSSIBLE SKIN GRAFT;  Surgeon: Newt Minion, MD;  Location: [place];  Service: Orthopedics;  Laterality: Right;  . JOINT REPLACEMENT    . SHOULDER SURGERY Right 01/06/2005  . TRIGGER FINGER RELEASE  12/202002   left hand -   . WRIST SURGERY Left 10/23/2004   and middle finger  . WRIST SURGERY Right 11/05/2005  . WRIST SURGERY Right 03/11/2006   cyst removed    Assessment & Plan Clinical Impression: Patient is a 64 y.o. year old right-handed female history of RA with chronic prednisone, diastolic congestive heart failure, CAD followed at [facility] status post cardiac cath August 2021 underwent rotational atherectomy assisted multivessel PCI maintained on Plavix and Eliquis x12 months, CKD stage II with creatinine 1.37, hypertension, hyperlipidemia, diabetes mellitus with insulin pump and peripheral neuropathy. Per chart review she lives with spouse. 1 level home with ramped entrance. Modified independent with rolling walker prior to admission. Presented 12/19/2019 with bilateral ischemic ulcers and lower extremity rest pain. She is status post debridement and skin graft for right Achilles ulcer 4 months out from surgery. 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 Sharol Given. Wound vacs as directed. Acute blood loss anemia 8.8. Therapy evaluations completed and patient was admitted for a comprehensive rehab program. Patient transferred to CIR on 12/23/2019.   Pt presents with decreased activity tolerance, decreased functional mobility, decreased balance and difficulty maintaining precautions, feelings of stress/anxiety Limiting pt's independence with leisure/community pursuits.  Pt seen during co-treat with pt today to discuss leisure interests, activity modifications and coping.    Plan  Min 1 TR session >20 minutes per week during LOS  Recommendations for other services: None   Discharge Criteria: Patient will be discharged from TR if patient refuses treatment 3 consecutive times without medical reason.  If treatment goals not met, if there is a change in medical status, if patient makes no progress towards goals  or if patient is discharged from hospital.  The above assessment, treatment plan, treatment alternatives and goals were discussed and mutually agreed upon: by patient  [place] 12/26/2019, 3:49 PM

## 2019-12-26 NOTE — Plan of Care (Signed)
  Problem: RH BOWEL ELIMINATION Goal: RH STG MANAGE BOWEL WITH ASSISTANCE Description: STG Manage Bowel with Min Assistance. Outcome: Progressing   Problem: RH SKIN INTEGRITY Goal: RH STG SKIN FREE OF INFECTION/BREAKDOWN Description: Skin to remain free from infection and breakdown with min assist while on CIR. Outcome: Progressing Goal: RH STG ABLE TO PERFORM INCISION/WOUND CARE W/ASSISTANCE Description: STG Able To Perform Incision/Wound Care With min Assistance. Outcome: Progressing

## 2019-12-26 NOTE — Progress Notes (Signed)
Physical Therapy Session Note  Patient Details  Name: Kaitlin Hudson MRN: [id number] Date of Birth: August 27, 1955  Today's Date: 12/26/2019 PT Individual Time: 1420-1535 PT Individual Time Calculation (min): 75 min   Short Term Goals: Week 1:  PT Short Term Goal 1 (Week 1): Patient will perform bed mobility with supervision. PT Short Term Goal 2 (Week 1): Patient will perform pressure relief independently when seated in w/c. PT Short Term Goal 3 (Week 1): Patient will perform basic transfers with min A. PT Short Term Goal 4 (Week 1): Patient will propel w/c >100 feet with supervision.  Skilled Therapeutic Interventions/Progress Updates:     Patient in recliner with her husband in the room upon PT arrival. Patient alert and agreeable to PT session. Patient reported 5-6/10 B residual limb pain during session, RN provided pain medicine prior to session. PT provided repositioning, rest breaks, and distraction as pain interventions throughout session. Misty Stanley, RT, present for co-treat throughout session. Focused on goals for leisure activity, stress management, and coping strategies throughout session. Patient expressed concerns about loss of independence and loss of future plans for travel during session.   Patient's husband provided home measurements and discussed concerns about various heights of household surfaces. Provided solutions for lowering bed height with box springs and mattress on the floor, addressing residual limb elevation in power chair, and progression to unlevel transfers with patient for improved household mobility. Patient and her husband attentive and receptive to education throughout discussion.   Therapeutic Activity: Bed Mobility: Patient performed rolling R and supine to/from sit with supervision on a mat table. Provided verbal cues for transitioning through side-lying and using her bottom elbow for trunk control. Patient expressed concerns about mobility in a regular bed without  rails, educated on use of this technique for improved independence with mobility. Patient appreciative.  Transfers: Patient performed slide board transfers recliner>w/c and unlevel transfer w/c<>mat table with CGA and RT SBA for safety and anxiety managment and min A-supervision for board placement. Provided cues for hand placement, board placement, and head-hips relationship for proper technique and decreased assist with transfers. Problem solved management of shorts to prevent catching and avoiding the w/c wheel during transfers. Patient performed w/c set up with cues prior to transfer to mat table.  Wheelchair Mobility:  Patient propelled wheelchair 80 feet and 40 feet with supervision with w/c gloves donned. Provided verbal cues for turning technique and donning/doffing limb pads and use of breaks throughout session.   Neuromuscular Re-ed: Patient performed the following sitting balance activities: -sitting EOM patient performed ball toss activity x8 min progressing to reaching outside of BOS with supervision for sitting balance, tolerated holding a conversation with RT throughout for reduced anxiety and demonstrating good confidence in her balance throughout   Patient in w/c with her husband in the room at end of session with breaks locked and all needs within reach. Following session, educated nursing staff on +2 A/P transfers to return patient to the bed at a later time.    Therapy Documentation Precautions:  Precautions Precautions: Fall, Other (comment) Precaution Comments: x2 wound vacs Restrictions Weight Bearing Restrictions: Yes RLE Weight Bearing: Non weight bearing LLE Weight Bearing: Non weight bearing   Therapy/Group: Individual Therapy  Selen Smucker L Debrina Kizer PT, DPT  12/26/2019, 4:55 PM

## 2019-12-27 ENCOUNTER — Inpatient Hospital Stay (HOSPITAL_COMMUNITY): Payer: Medicare Other

## 2019-12-27 ENCOUNTER — Inpatient Hospital Stay (HOSPITAL_COMMUNITY): Payer: Medicare Other | Admitting: Physical Therapy

## 2019-12-27 ENCOUNTER — Inpatient Hospital Stay (HOSPITAL_COMMUNITY): Payer: Medicare Other | Admitting: *Deleted

## 2019-12-27 LAB — GLUCOSE, CAPILLARY
Glucose-Capillary: 82 mg/dL (ref 70–99)
Glucose-Capillary: 114 mg/dL — ABNORMAL HIGH (ref 70–99)
Glucose-Capillary: 107 mg/dL — ABNORMAL HIGH (ref 70–99)
Glucose-Capillary: 242 mg/dL — ABNORMAL HIGH (ref 70–99)
Glucose-Capillary: 79 mg/dL (ref 70–99)

## 2019-12-27 NOTE — Progress Notes (Signed)
[place] PHYSICAL MEDICINE & REHABILITATION PROGRESS NOTE   Subjective/Complaints: Wound vac removed yesterday without complications- patient states removal was painful.  Pain is currently well controlled with oxycodone.  She expressed that she wants to be as independent as possible.   ROS- denies CP, SOB, N/V/D  Objective:   No results found. No results for input(s): WBC, HGB, HCT, PLT in the last 72 hours. No results for input(s): NA, K, CL, CO2, GLUCOSE, BUN, CREATININE, CALCIUM in the last 72 hours.  Intake/Output Summary (Last 24 hours) at 12/27/2019 1134 Last data filed at 12/27/2019 0700 Gross per 24 hour  Intake 340 ml  Output --  Net 340 ml    Physical Exam: Vital Signs Blood pressure (!) 137/53, pulse 88, temperature 98.4 F (36.9 C), temperature source Oral, resp. rate 17, height 5\' 4"  (1.626 m), weight 83.9 kg, SpO2 98 %. General: Alert and oriented x 3, No apparent distress HEENT: Head is normocephalic, atraumatic, PERRLA, EOMI, sclera anicteric, oral mucosa pink and moist, dentition intact, ext ear canals clear,  Neck: Supple without JVD or lymphadenopathy Heart: Reg rate and rhythm. No murmurs rubs or gallops Chest: CTA bilaterally without wheezes, rales, or rhonchi; no distress Abdomen: Soft, non-tender, non-distended, bowel sounds positive. Extremities: No clubbing, cyanosis, or edema. Pulses are 2+ Skin: No evidence of breakdown, no evidence of rash Neurologic: Cranial nerves II through XII intact, motor strength is 5/5 in bilateral deltoid, bicep, tricep, grip, 4- hip flexor. s/p B BKA Sensory exam normal sensation to light touch and proprioception in bilateral upper and lower extremities Cerebellar exam normal finger to nose to finger as well as heel to shin in bilateral upper and lower extremities Musculoskeletal: B BKA- wound vacs removed without complications  Assessment/Plan: 1. Functional deficits secondary to B BKA which require 3+ hours per day of  interdisciplinary therapy in a comprehensive inpatient rehab setting.  Physiatrist is providing close team supervision and 24 hour management of active medical problems listed below.  Physiatrist and rehab team continue to assess barriers to discharge/monitor patient progress toward functional and medical goals  Care Tool:  Bathing    Body parts bathed by patient: Right arm, Left arm, Chest, Abdomen, Front perineal area, Buttocks, Right upper leg, Left upper leg, Face     Body parts n/a: Right lower leg, Left lower leg (B BKA)   Bathing assist Assist Level: Minimal Assistance - Patient > 75%     Upper Body Dressing/Undressing Upper body dressing   What is the patient wearing?: Pull over shirt, Bra    Upper body assist Assist Level: Supervision/Verbal cueing    Lower Body Dressing/Undressing Lower body dressing      What is the patient wearing?: Incontinence brief (Shorts.)     Lower body assist Assist for lower body dressing: Minimal Assistance - Patient > 75%     Toileting Toileting Toileting Activity did not occur and hygiene only): N/A (no void or bm)  Toileting assist Assist for toileting: Minimal Assistance - Patient > 75%     Transfers Chair/bed transfer  Transfers assist     Chair/bed transfer assist level: Contact Guard/Touching assist     Locomotion Ambulation   Ambulation assist   Ambulation activity did not occur: Safety/medical concerns (New B BKA, B LEs NWB)          Walk 10 feet activity   Assist  Walk 10 feet activity did not occur: Safety/medical concerns        Walk 50  feet activity   Assist Walk 50 feet with 2 turns activity did not occur: Safety/medical concerns         Walk 150 feet activity   Assist Walk 150 feet activity did not occur: Safety/medical concerns         Walk 10 feet on uneven surface  activity   Assist Walk 10 feet on uneven surfaces activity did not occur:  Safety/medical concerns         Wheelchair     Assist Will patient use wheelchair at discharge?: Yes Type of Wheelchair: Manual Wheelchair activity did not occur: Safety/medical concerns (increased pain and anxiety during evalutaiton)  Wheelchair assist level: Supervision/Verbal cueing Max wheelchair distance: 80 ft    Wheelchair 50 feet with 2 turns activity    Assist    Wheelchair 50 feet with 2 turns activity did not occur: Safety/medical concerns   Assist Level: Supervision/Verbal cueing   Wheelchair 150 feet activity     Assist  Wheelchair 150 feet activity did not occur: Safety/medical concerns       Blood pressure (!) 137/53, pulse 88, temperature 98.4 F (36.9 C), temperature source Oral, resp. rate 17, height 5\' 4"  (1.626 m), weight 83.9 kg, SpO2 98 %.  Medical Problem List and Plan: 1.  Decreased functional mobility secondary to progressive bilateral lower extremity gangrenous changes with recent debridement and skin grafts.  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  Wound VAC as directed             -patient may not shower until wound VACs are off after 7 days, DC 10/13             -ELOS/Goals: 2 weeks- supervision to min A  -Continue CIR 2.  Antithrombotics: -DVT/anticoagulation: Eliquis             -antiplatelet therapy: Plavix 3. Pain Management: Pt emphatic doesn't want gabapentin- is willing to try Duloxetine- not allergic to it- will try 30 mg QHS x4 days then 60 mg QHS;  oxycodone as needed  10/12: continues to have that burning pain.   10/13: discussed pros and cons of Cymbalta- she prefers to tolerate pain without medication at this time but would like to have it ordered if needed.   10/14: pain is currently well controlled with oxycodone.  4. Mood: Provide emotional support             -antipsychotic agents: N/A 5. Neuropsych: This patient is capable of making decisions on her own behalf. 6. Skin/Wound Care: Routine skin checks- has Stage I  vs Stage II between buttocks- have nurse reassess- pt couldn't hold position to assess-  Wound vac removed on 10/13 without complications 7. Fluids/Electrolytes/Nutrition: Routine in and outs with follow-up chemistries 8.  Acute blood loss anemia.  Hgb up to 9.9 on 10/11 9.  Rheumatoid arthritis.  Chronic prednisone 10.  CAD status post cardiac catheterization August 2021 underwent rotational atherectomy assisted multivessel PCI.  Continue Plavix and Eliquis x12 months 11.  CKD stage IIIb.  Baseline creatinine 1.37 12.  Diastolic congestive heart failure.  Monitor for any signs of fluid overload.  Continue Bumex 2 mg daily 13.  Hypertension.  Lopressor 12.5 mg twice daily.  Monitor with increased mobility Vitals:   12/26/19 2100 12/27/19 0338  BP: (!) 126/55 (!) 137/53  Pulse: 89 88  Resp: 18 17  Temp: 98.2 F (36.8 C) 98.4 F (36.9 C)  SpO2: 97% 98%  controlled 10/12.  14.  Diabetes mellitus TYPE I, not  II with peripheral neuropathy.  Hemoglobin A1c 6.3.  Patient with insulin pump prior to admission.  Follow-up diabetic coordinator- will con't insulin pump, since pt has managed for >60 years.  CBG (last 3)  Recent Labs    12/26/19 2108 12/27/19 0335 12/27/19 0624  GLUCAP 173* 82 114*  10/12: uncontrolled this morning to 236. 42 on 10/11 evening. Discussed her insulin pump. She will take 2U less than before.   10/13: she discussed with her endocroniologist and they have decreased her basal rate by 20% with good results.  15. Constipation- 1st BM since admission was today- added Senokot 1 tab daily- also on Dulcolax pills- for 3 days; might need Sorbitol, etc?? 16. Urinary and bowel urgency- is chronic for pt- has bedpan right next to her on bed.  17. Itching in residual limbs: benadryl cream ordered and is helping    LOS: 4 days A FACE TO FACE EVALUATION WAS PERFORMED  Clint Bolder P Pasquale Matters 12/27/2019, 11:34 AM

## 2019-12-27 NOTE — Progress Notes (Signed)
Recreational Therapy Session Note  Patient Details  Name: Kaitlin Hudson MRN: [id number] Date of Birth: 1955/12/09 Today's Date: 12/27/2019  Pain: no c/o pain, however c/o itching on residual limbs, PT previously applied cream and rewrapped Skilled Therapeutic Interventions/Progress Updates: Session focused on coping, emphasis on   social, emotional, cognitive, spiritual aspects of wellness in addition to physical.  Dicussed activity modifications, discharge planning, social support.  Pt was tearful throughout this session, stating she felt "like a burden."  Emotional support provided.  Pt stated understanding and appreciation for the aforementioned education.  Reinforcement needed.  Therapy/Group: Individual Therapy   Lyrical Sowle 12/27/2019, 11:25 AM

## 2019-12-27 NOTE — Progress Notes (Signed)
Patient ID: Kaitlin Hudson, female   DOB: 25-Nov-1955, 64 y.o.   MRN: [id number]  SW ordered bilateral amputee pads from [facility] via parachute.   SW met with pt in room to inform on above. Pt reports she has heard from [employer] and item will be delivered to room prior to d/c. Pt also discussed having a bariatric DABSC as she reports this allows her to safely transfer from w/c to commode. SW informed will see if able to get item covered under insurance. Pt willing to private pay for item if unable to get covered.  *SW later met with pt and pt husband in room in which husband brought in w/c and pt appears to already have ELRs with amputee pads. SW waiting on confirmation from PT.    Loralee Pacas, MSW, [place] Office: [phone] Cell: [phone] Fax: [phone]

## 2019-12-27 NOTE — Progress Notes (Signed)
Occupational Therapy Session Note  Patient Details  Name: Kaitlin Hudson MRN: [id number] Date of Birth: 06/06/1955  Today's Date: 12/27/2019 OT Individual Time: 1000-1115 OT Individual Time Calculation (min): 75 min   Short Term Goals: Week 1:  OT Short Term Goal 1 (Week 1): Pt will complete BSC transfer with min A OT Short Term Goal 2 (Week 1): Pt will complete toileting tasks with min A OT Short Term Goal 3 (Week 1): Pt will don LB clothing with CGA OT Short Term Goal 4 (Week 1): Pt will correctly position BLE in bed with no more than min cueing  Skilled Therapeutic Interventions/Progress Updates:    1:1. Pt received in w/c with rec therapist present. Focus of session on support/encouragement/coping with change, resource education (first steps amputee booklet), desensitization techniques, functional transfers and w/c mobility in tight spaces to simulate home environmeent. Pt already completed ADLs this morning and OT educates on different pain management techniques and provides handout for desensitization practices. Pt completes 10  min of laterality training with 80% accuracy for phantom limb sensation/retraining pain management. Pt escorted outisde to courtyard for mood support and w/c propulsion over uneven/tight surfaces. Pt reporting feeling, "a lot better after getting out." Pt able to navigate and steer w/c with VC for backing up into and turning around in elevator to tight space negotiation. Pt returned to room and SB transfer back to recliner with MIN A overall and VC for checking for board placement. Exited session with pt seated in reclienr, exit alarm on and call light in reach   Therapy Documentation Precautions:  Precautions Precautions: Fall, Other (comment) Precaution Comments: x2 wound vacs Restrictions Weight Bearing Restrictions: Yes RLE Weight Bearing: Non weight bearing LLE Weight Bearing: Non weight bearing General:   Vital Signs:   Pain: Pain Assessment Pain  Scale: 0-10 Pain Score: 4  Pain Type: Surgical pain Pain Location: Leg Pain Orientation: Right;Left Pain Descriptors / Indicators: Aching Patients Stated Pain Goal: 2 Pain Intervention(s): Medication (See eMAR) ADL: ADL Eating: Set up Where Assessed-Eating: Bed level Grooming: Supervision/safety Where Assessed-Grooming: Bed level Upper Body Bathing: Supervision/safety Where Assessed-Upper Body Bathing: Bed level Lower Body Bathing: Minimal assistance Where Assessed-Lower Body Bathing: Bed level Upper Body Dressing: Supervision/safety Where Assessed-Upper Body Dressing: Bed level Lower Body Dressing: Moderate assistance Where Assessed-Lower Body Dressing: Bed level Toileting: Moderate assistance Where Assessed-Toileting: Wheelchair (using urinal) Toilet Transfer: Unable to assess Tub/Shower Transfer: Unable to assess Tub/Shower Transfer Method: Unable to assess [employer] Transfer: Unable to assess [employer] Transfer Method: Unable to assess Vision   Perception    Praxis   Exercises:   Other Treatments:     Therapy/Group: Individual Therapy  Shon Hale 12/27/2019, 11:14 AM

## 2019-12-27 NOTE — Progress Notes (Signed)
Physical Therapy Session Note  Patient Details  Name: Kaitlin Hudson MRN: [id number] Date of Birth: 1955-10-12  Today's Date: 12/27/2019 PT Individual Time: 0800-0905 PT Individual Time Calculation (min): 65 min   Short Term Goals: Week 1:  PT Short Term Goal 1 (Week 1): Patient will perform bed mobility with supervision. PT Short Term Goal 2 (Week 1): Patient will perform pressure relief independently when seated in w/c. PT Short Term Goal 3 (Week 1): Patient will perform basic transfers with min A. PT Short Term Goal 4 (Week 1): Patient will propel w/c >100 feet with supervision.  Skilled Therapeutic Interventions/Progress Updates:     Patient in bed upon PT arrival. Patient alert and agreeable to PT session. Patient reported 5/10 B residual limb pain during session, RN made aware and provided pain medicine during session. PT provided repositioning, rest breaks, and distraction as pain interventions throughout session.   Therapeutic Activity: Bed Mobility: Patient performed supine to/from sit with supervision with HOB slightly elevated. Provided verbal cues for no use of bed rails and transitioning throughout side-lying for increased UE leverage. Transfers: Patient performed A/P transfer bed<>bariatric BSC with CGA and PT stabilizing BSC for safety/balance. Provided verbal cues for increased lateral lean for improve hip clearance with reciprocal scooting and hand placement. Patient was continent of bladder on BSC. Performed LB clothing management performing lateral leans with min A and peri-care with set-up assist during toileting. Patient performed a slide board transfer bed>w/c with CGA and mod A for board placement. Provided cues for hand placement, board placement, and head-hips relationship for proper technique and decreased assist with transfers.   Patient reported significant inching and redness just below her knees, reported it started from the adhesive from the wound vacs. Noted  erythema and mild urticaria under shrinker in the same area. Doffed shrinker and dressings. Erythema and uticaria limited to posterior calf well above incisions. Incisions with minimal drainage and healing well on observation. L residual limb with increased edema and erythema compared to R. Applied Benadryl cream over skin irritation well above incisions, per RN, then applied nonadherent dressing with Kerlex over incisions, per RN. Donned B shrinkers over dressings with total A. Educated patient on technique, wound care, infection prevention, signs and symptoms of infection, and daily residiual limb inspection with phone/pictures and/or hand held mirror.   Patient in w/c at end of session with breaks locked and all needs within reach.    Therapy Documentation Precautions:  Precautions Precautions: Fall, Other (comment) Precaution Comments: x2 wound vacs Restrictions Weight Bearing Restrictions: Yes RLE Weight Bearing: Non weight bearing LLE Weight Bearing: Non weight bearing   Therapy/Group: Individual Therapy  Kaitlin Hudson L Cashmere Harmes PT, DPT  12/27/2019, 12:18 PM

## 2019-12-27 NOTE — Progress Notes (Signed)
Physical Therapy Session Note  Patient Details  Name: Kaitlin Hudson MRN: [id number] Date of Birth: 1955/10/07  Today's Date: 12/27/2019 PT Individual Time: 1340-1454 PT Individual Time Calculation (min): 74 min   Short Term Goals: Week 1:  PT Short Term Goal 1 (Week 1): Patient will perform bed mobility with supervision. PT Short Term Goal 2 (Week 1): Patient will perform pressure relief independently when seated in w/c. PT Short Term Goal 3 (Week 1): Patient will perform basic transfers with min A. PT Short Term Goal 4 (Week 1): Patient will propel w/c >100 feet with supervision.  Skilled Therapeutic Interventions/Progress Updates:    Pt received sitting in the recliner with her husband present and pt agreeable to therapy session. Pt wearing B LE shrinkers throughout session. Pt's husband has brought pt's w/c to utilize during CIR stay and to ensure proper fit/function for patient. Therapist educated and showed pt/husband how to modify ELRs to become amputee pads and therapist taped towels to the pads to provide a larger surface area for increased pressure distribution. Therapist educate pt on how to don/doff amputee pads and how to swing back w/c arm rests for set-up of slide board transfers - pt will benefit from additional education/training for w/c part management. L lateral scoot transfer drop arm recliner>w/c using transfer board with pt able to place board without assistance (pt able to self-cue proper positioning of board without therapist cuing demoing recall of prior education) and performed transfer with CGA for steadying and again pt verbalizing proper cuing for head/hips relationship without cuing. Donned w/c gloves for use during session. B UE w/c propulsion ~154ft (1x rest break) with supervision - cuing to grasp further back on w/c bars for more efficient propulsion technique to decrease push-strokes per distance. Performed UBE for 4 minutes ( forward/2 min back) against level 3  resistance targeting UE strengthening and cardiovascular endurance training with goal of maintaining reps per minute >25 and pt achieving .74mi distance - reports some discomfort in hands, wrists, and shoulders due to hx of RA therefore limited time of this exercise. Pt set-up for L lateral scoot transfer w/c>EOM using transfer board with mod cuing for w/c leg rest and arm rests management and min cuing for proper positioning of w/c - performed transfer with CGA for steadying throughout and pt able to place transfer board without assist. Seated EOM performed B UE and core strengthening task via seated [place] twists using 1kg weighted ball 2x10 reps.  Pt education regarding importance of maintaining full hip and knee extension with pt reporting she has joint tightness along with difficulty lying flat due to 4 month hx of sleeping in a seated position due to pain from her prior Achilles wounds. Sit>supine on mat table with supervision - pt reports this was a more comfortable position than she anticipated - performed supine hip flexor stretch ~46minutes. Attempted L sidelying position but pt unable to tolerate with significant increase in B LE pain. Supine B LE hip extensor strengthening via bridge with B LEs on bolster x15 reps and B LE hip abductor strengthening via supine hip abduction against level 3 theraband resistance x 15 reps. Supine>sitting EOM with CGA and increased time. R lateral scoot EOM>w/c using transfer board with CGA for steadying. Transported back to room and pt requesting to return to recliner. R lateral scoot using transfer board with supervision. Pt left seated in recliner with needs in reach and her husband present.  Therapy Documentation Precautions:  Precautions Precautions: Fall, Other (comment) Precaution  Comments: x2 wound vacs Restrictions Weight Bearing Restrictions: Yes RLE Weight Bearing: Non weight bearing LLE Weight Bearing: Non weight bearing  Pain: Has 2 episodes of  sudden onset B LE pain with no provoking task causing B LE "shaking" and "muscle spasms" with pt performing self soothing via rubbing her LEs - educated pt on continuing to provide this tactile stimulus for pain management in the future when this occurs to utilize the gate control pain theory. Pain not rated throughout session - therapist provides rest breaks, distraction, emotional support, and repositioning as pain management techniques during session.   Therapy/Group: Individual Therapy  Ginny Forth , PT, DPT, CSRS  12/27/2019, 6:20 PM

## 2019-12-27 NOTE — Progress Notes (Signed)
Pt informed this nurse that she changed her insulin pump. No problems at this time

## 2019-12-27 NOTE — IPOC Note (Signed)
Overall Plan of Care [facility]) Patient Details Name: Kaitlin Hudson MRN: [id number] DOB: 22-Jun-1955  Admitting Diagnosis: S/P bilateral below knee amputation [facility])  Hospital Problems: Principal Problem:   S/P bilateral below knee amputation (HCC)     Functional Problem List: Nursing Endurance, Nutrition, Pain, Safety, Skin Integrity  PT Balance, Pain, Edema, Safety, Endurance, Sensory, Skin Integrity, Motor, Nutrition  OT Balance, Skin Integrity, Endurance, Motor, Pain, Safety  SLP    TR         Basic ADL's: OT Bathing, Dressing, Toileting     Advanced  ADL's: OT       Transfers: PT Bed Mobility, Bed to Chair, Car, Occupational psychologist, Research scientist (life sciences): PT Ambulation, Psychologist, prison and probation services, Stairs     Additional Impairments: OT None  SLP        TR      Anticipated Outcomes Item Anticipated Outcome  Self Feeding no goal set  Swallowing      Basic self-care  (S)  Toileting  (S)   Bathroom Transfers (S)  Bowel/Bladder  Continue to be continent of bowel and bladder during admission  Transfers  Supervision  Locomotion  mod I w/c level  Communication     Cognition     Pain  Patient to be pain free or pain less than 3 during admission  Safety/Judgment  Patient to be free from falls and adhere to safety plan   Therapy Plan: PT Intensity: Minimum of 1-2 x/day ,45 to 90 minutes PT Frequency: 5 out of 7 days PT Duration Estimated Length of Stay: 2-2.5 weeks OT Intensity: Minimum of 1-2 x/day, 45 to 90 minutes OT Frequency: 5 out of 7 days OT Duration/Estimated Length of Stay: 2 weeks     Due to the current state of emergency, patients may not be receiving their 3-hours of Medicare-mandated therapy.   Team Interventions: Nursing Interventions Patient/Family Education, Disease Management/Prevention, Skin Care/Wound Management, Pain Management, Psychosocial Support  PT interventions Balance/vestibular training, Community reintegration, Discharge  planning, Disease management/prevention, DME/adaptive equipment instruction, Functional electrical stimulation, Functional mobility training, Neuromuscular re-education, Pain management, Patient/family education, Psychosocial support, Skin care/wound management, Splinting/orthotics, Stair training, Therapeutic Activities, Therapeutic Exercise, UE/LE Strength taining/ROM, UE/LE Coordination activities, Wheelchair propulsion/positioning  OT Interventions Balance/vestibular training, Discharge planning, Self Care/advanced ADL retraining, Pain management, Therapeutic Activities, UE/LE Coordination activities, Therapeutic Exercise, Skin care/wound managment, Patient/family education, Functional mobility training, DME/adaptive equipment instruction, Community reintegration, Psychosocial support, UE/LE Strength taining/ROM, Wheelchair propulsion/positioning  SLP Interventions    TR Interventions    SW/CM Interventions Discharge Planning, Psychosocial Support, Patient/Family Education   Barriers to Discharge MD  Medical stability  Nursing      PT Decreased caregiver support, Home environment access/layout, Incontinence, Lack of/limited family support, Weight bearing restrictions Patient's husband is only caregiver available upon d/c, will need to be able to be home alone for 1-2 hours at a time, new incontience due to mobility deficits, B LE NWB, narrow doorways do not allow manual w/c access to rooms, has power chair that can access rooms  OT      SLP      SW Decreased caregiver support Spouse has back and neck restrictions   Team Discharge Planning: Destination: PT-Home ,OT- Home , SLP-  Projected Follow-up: PT-Home health PT, OT-  Home health OT, SLP-  Projected Equipment Needs: PT-To be determined, OT- To be determined, SLP-  Equipment Details: PT-Patient reports that she has a RW, manual w/c, 3-in-1 BSC, and a power mobility device, will  determine additional DME needs as patient progresses, OT-   Patient/family involved in discharge planning: PT- Patient,  OT-Patient, SLP-   MD ELOS: 2 weeks  Medical Rehab Prognosis:  Excellent Assessment: Kaitlin Hudson is a 64 year old woman who is admitted to CIR with decreased functional mobility secondary to progressive bilateral lower extremity gangrenous changes with recent debridement and skin grafts. 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 Wound vac was removed on 10/13 without complications. She continues to have neuropathic pain but prefers no medications for it. Her residual limb pain is well controlled with oxycodone. She continues on prednisone for her rheumatoid arthritis. Her BP and HR are monitored TID given her HTN and tachycardia. Her Creatinine and Hgb are monitored weekly given her CKD, She continues on Bumex for diastolic congestive heart failure. Her constipation is being managed with oral laxatives. Her CBGs have fluctuated dramatically so basal rate on her insulin pump was reduced by 20%   See Team Conference Notes for weekly updates to the plan of care

## 2019-12-27 NOTE — Plan of Care (Signed)
°  Problem: RH BOWEL ELIMINATION Goal: RH STG MANAGE BOWEL WITH ASSISTANCE Description: STG Manage Bowel with Min Assistance. Outcome: Progressing   Problem: RH BLADDER ELIMINATION Goal: RH STG MANAGE BLADDER WITH ASSISTANCE Description: STG Manage Bladder With Min Assistance Outcome: Progressing   Problem: RH SKIN INTEGRITY Goal: RH STG SKIN FREE OF INFECTION/BREAKDOWN Description: Skin to remain free from infection and breakdown with min assist while on CIR. Outcome: Progressing Goal: RH STG ABLE TO PERFORM INCISION/WOUND CARE W/ASSISTANCE Description: STG Able To Perform Incision/Wound Care With min Assistance. Outcome: Progressing

## 2019-12-28 ENCOUNTER — Inpatient Hospital Stay (HOSPITAL_COMMUNITY): Payer: Medicare Other | Admitting: Occupational Therapy

## 2019-12-28 ENCOUNTER — Inpatient Hospital Stay (HOSPITAL_COMMUNITY): Payer: Medicare Other

## 2019-12-28 ENCOUNTER — Encounter (HOSPITAL_COMMUNITY): Payer: Medicare Other | Admitting: Psychology

## 2019-12-28 ENCOUNTER — Inpatient Hospital Stay (HOSPITAL_COMMUNITY): Payer: Medicare Other | Admitting: Physical Therapy

## 2019-12-28 ENCOUNTER — Inpatient Hospital Stay (HOSPITAL_COMMUNITY): Payer: Medicare Other | Admitting: *Deleted

## 2019-12-28 LAB — GLUCOSE, CAPILLARY
Glucose-Capillary: 128 mg/dL — ABNORMAL HIGH (ref 70–99)
Glucose-Capillary: 134 mg/dL — ABNORMAL HIGH (ref 70–99)
Glucose-Capillary: 101 mg/dL — ABNORMAL HIGH (ref 70–99)
Glucose-Capillary: 239 mg/dL — ABNORMAL HIGH (ref 70–99)
Glucose-Capillary: 86 mg/dL (ref 70–99)

## 2019-12-28 MED ORDER — ACETAMINOPHEN 325 MG PO TABS
325.0000 mg | ORAL_TABLET | ORAL | Status: DC | PRN
  Administered 2019-12-28 – 2020-01-04 (×8): 650 mg via ORAL
  Filled 2019-12-28 (×9): qty 2

## 2019-12-28 NOTE — Progress Notes (Signed)
Physical Therapy Session Note  Patient Details  Name: Kaitlin Hudson MRN: [id number] Date of Birth: 06/02/1955  Today's Date: 12/28/2019 PT Individual Time: 9417-4081 and 4481-8563 PT Individual Time Calculation (min): 33 min and 66 min  Short Term Goals: Week 1:  PT Short Term Goal 1 (Week 1): Patient will perform bed mobility with supervision. PT Short Term Goal 2 (Week 1): Patient will perform pressure relief independently when seated in w/c. PT Short Term Goal 3 (Week 1): Patient will perform basic transfers with min A. PT Short Term Goal 4 (Week 1): Patient will propel w/c >100 feet with supervision.  Skilled Therapeutic Interventions/Progress Updates:    Session 1: Pt received sitting in w/c and agreeable to therapy session. Misty Stanley, Architect present throughout session.  Focus of session on pt education regarding the following: - discharge planning - home modifications to allow pt increased independence at wheelchair level (for example: moving items onto the counter for ease of access moving clothes to a lower shelf/drawer, etc.) - follow-up therapy recommendations - plan to provide pt with written HEP prior to discharge - timeline for progression to receiving prostheses  Pt expresses concerns regarding how to remember all of the education being provided while in CIR - therapist will provide written resources as a reference and also pt has acces to the Amputee First Steps booklet as well. Pt appreciative of education. Pt left seated in w/c with needs in reach.  Session 2: Pt received sitting in w/c with her husband present and pt agreeable to therapy session. RN in/out for medication administration. Pt reports her main concern right now is transferring to/from her bed due to the height - therapist discussed possibility of them exchanging the box spring for a bunkie board as that is much shorter than a box spring - therapist showed pt/husband pictures and recommended they  discuss this further with the company where they purchased the bed/mattress. Also discussed need to perform real life car transfer mid/late next week in preparation for discharge. Donned w/c gloves. B UE w/c propulsion ~175ft to main therapy gym with supervision - pt demos improving propulsion technique with decreased push-strokes to achieve this distance - pt continues to report she is limited in this activity due to B UE arthritis. Pt able to set-up for R lateral scoot transfer w/c>EOM using transfer board including management of w/c parts with only min cuing for w/c alignment and CGA for safety during transfer. Sit>supine on mat with supervision.  Performed the following B LE exercises:  - supine hip flexor stretch 2 minutes - pt reports lying flat in the bed last night following prior education on importance of maintaining full hip extension ROM and reported that it felt "different" but no increase in pain  - supine straight leg raise with 3lb weight 2x10 reps - was only minimally able to raise LEs initially progressed to full ROM without assist - cuing to sustain knee extension quad contraction throughout  - supine bridging with BLEs on bolster to maintain NWBing 2x15 reps - pt demos increased hip clearance compared to prior session - sidelying hip abduction x10 reps each LE with therapist manually facilitating/cuing for proper alignment  Pt able to tolerate sidelying today via having pt hold pillow between her legs and bring knees to chest while rolling to/form supine allowing decreased pain and she was able to perform an exercise in this position - this is significantly improved from yesterday as pt not able to tolerate sidelying for >3seconds due to  pain. Supine>sitting L EOM with close supervision and increased time for trunk upright. L lateral scoot EOM>w/c using transfer board with CGA progressing towards close supervision. Transported back to room and pt requesting to get in the recliner. R lateral  scoot w/c>recliner using transfer board with mod cuing for set-up and CGA for steadying. Pt left seated in recliner with needs in reach and her husband present.   Therapy Documentation Precautions:  Precautions Precautions: Fall, Other (comment) Precaution Comments: x2 wound vacs Restrictions Weight Bearing Restrictions: Yes RLE Weight Bearing: Non weight bearing LLE Weight Bearing: Non weight bearing  Pain:   Session 1: No reports of pain throughout session.  Session 2: Pt premedicated at beginning of session for pain management. Therapist utilized repositioning, rest, distraction, emotional support, and tactile stimulus as pain management strategies during session.   Therapy/Group: Individual Therapy  Ginny Forth , PT, DPT, CSRS  12/28/2019, 7:58 AM

## 2019-12-28 NOTE — Consult Note (Signed)
Neuropsychological Consultation   Patient:   Kaitlin Hudson   DOB:   06-09-1955  MR Number:  [id number]  Location:  [facility] [address] CENTER A [address] [id number] [place] [address] Dept: [phone] Loc: [phone]           Date of Service:   /15/2021  Start Time:   9 AM End Time:   10 AM  Provider/Observer:  Arley Phenix, Psy.D.       Clinical Neuropsychologist       Billing Code/Service: [phone]  Chief Complaint:    Kaitlin Hudson is a 64 year old female with history of RA with chronic prednisone, diastolic congestive heart failure, CAD. Patient has been followed at [facility] status post cardiac cath in August 2021. Patient also has stage II chronic kidney disease, hypertension, hyperlipidemia, diabetes type 1 with insulin pump and peripheral neuropathy. Patient is status post debridement and skin graft for right Achilles ulcer 4 months out of surgery. Patient with progressive gangrenous necrotic changes follow-up with orthopedic services and limbs were felt not to be salvageable and underwent bilateral transtibial amputation on 12/19/2019. Patient was admitted to the comprehensive rehab program due to functional status.  Reason for Service:  Patient was referred for neuropsychological consultation due to stress and anxiety/worry around adjustment coping with recent bilateral BKA. Below see HPI for the current admission.  HPI: Kaitlin Hudson is a 64 year old right-handed female history of RA with chronic prednisone, diastolic congestive heart failure, CAD followed at [facility] status post cardiac cath August 2021 underwent rotational atherectomy assisted multivessel PCI maintained on Plavix and Eliquis x12 months, CKD stage II with creatinine 1.37, hypertension, hyperlipidemia, diabetes mellitus with insulin pump and peripheral neuropathy.  Per chart review she lives with spouse.  1 level home with  ramped entrance.  Modified independent with rolling walker prior to admission.  Presented 12/19/2019 with bilateral ischemic ulcers and lower extremity rest pain.  She is status post debridement and skin graft for right Achilles ulcer 4 months out from surgery.  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 Lajoyce Corners.  Wound vacs as directed.  Acute blood loss anemia 8.8.  Therapy evaluations completed and patient was admitted for a comprehensive rehab program.   Current Status:  Patient was sitting in her wheelchair alert and oriented when I entered the room. Mood was appropriate and affect/demeanor was quite pleasant and open. Patient acknowledged worry about whether she will be able to remember and learn all the things she has having to cope with that is new since her bilateral BKA. The patient is concerned about how her RA and difficulties with her hands will complicate these efforts. The patient is mental status and cognition appear to be quite good. The patient remains very active and motivated and therapeutic interventions and while she is still hesitant to be confident that she will be able to utilize prosthetics going forward she is ready to do what she needs to do to have that possible in the future. Patient acknowledges anxiety and depressive symptoms but denies that the level is to the point that it is negatively impacting therapeutic efforts.  Behavioral Observation: [name]  presents as a 64 y.o.-year-old Right Caucasian Female who appeared her stated age. her dress was Appropriate and she was Well Groomed and her manners were Appropriate to the situation.  her participation  was indicative of Appropriate and Attentive behaviors.  There were any physical disabilities noted.  she displayed an appropriate level of cooperation and motivation.     Interactions:     Active Appropriate  Attention:   within normal limits and attention span and concentration were age appropriate  Memory:   within normal limits; recent and remote memory intact  Visuo-spatial:  not examined  Speech (Volume):  normal  Speech:   normal; normal  Thought Process:  Coherent and Relevant  Though Content:  WNL; not suicidal and not homicidal  Orientation:   person, place, time/date and situation  Judgment:   Good  Planning:   Fair  Affect:    Anxious  Mood:    Anxious  Insight:   Good  Intelligence:   normal  Medical History:   Past Medical History:  Diagnosis Date  . Arthritis, rheumatoid (HCC)   . CHF (congestive heart failure) (HCC)    pt denies this dx  . Coronary artery disease   . Diabetes mellitus without complication (HCC)   . Headache   . Hyperlipidemia   . Hypertension   . Latent tuberculosis    pt denies this dx  . Peripheral vascular disease (HCC)    blood clot in left leg  . Type 1 diabetes [facility])     Psychiatric History:  Patient has had difficulties with coping.  Family Med/Psych History: History reviewed. No pertinent family history.  Impression/DX:  Kaitlin Hudson is a 64 year old female with history of RA with chronic prednisone, diastolic congestive heart failure, CAD. Patient has been followed at [facility] status post cardiac cath in August 2021. Patient also has stage II chronic kidney disease, hypertension, hyperlipidemia, diabetes type 1 with insulin pump and peripheral neuropathy. Patient is status post debridement and skin graft for right Achilles ulcer 4 months out of surgery. Patient with progressive gangrenous necrotic changes follow-up with orthopedic services and limbs were felt not to be salvageable and underwent bilateral transtibial amputation on 12/19/2019. Patient was admitted to the comprehensive rehab program due to functional status.  Patient was sitting in her wheelchair alert and oriented when I entered  the room. Mood was appropriate and affect/demeanor was quite pleasant and open. Patient acknowledged worry about whether she will be able to remember and learn all the things she has having to cope with that is new since her bilateral BKA. The patient is concerned about how her RA and difficulties with her hands will complicate these efforts. The patient is mental status and cognition appear to be quite good. The patient remains very active and motivated and therapeutic interventions and while she is still hesitant to be confident that she will be able to utilize prosthetics going forward she is ready to do what she needs to do to have that possible in the future. Patient acknowledges anxiety and depressive symptoms but denies that the level is to the point that it is negatively impacting therapeutic efforts.  Disposition/Plan:  Today we worked on coping and adjustment issues and the patient appears to be quite receptive and willing to continue with good efforts throughout rehabilitative stay. Worked on issues around her anxiety and worry about whether her RA will have a great enough negative impact that she will be able to achieve the goals that she has set in the unit has set for her to achieve. I will follow up with the patient next week if needed.          Electronically Signed  _______________________ Ilean Skill, Psy.D.

## 2019-12-28 NOTE — Progress Notes (Signed)
Recreational Therapy Session Note  Patient Details  Name: Ayane Delancey MRN: [id number] Date of Birth: July 05, 1955 Today's Date: 12/28/2019  Pain: no c/o Skilled Therapeutic Interventions/Progress Updates: Pt up in w/c upon entry.  Pt expressed multiple   questions/concerns about recovery, education, discharge planning. Pt reports being "forgetful" and feeling "overwhelmed."  Answered pts questions and discussed compensatory strategies to assist with memory and organization of the wealth of verbal and written information she is receiving.  Pt stated understanding and appreciation.  Pt is extremely motivated and wanting to learn as much as she can about what to expect and how best to plan for discharge, prosthetics and life after amputation.  Encouraged patient to continue to self advocate.   Therapy/Group: Co-Treatment  Issabella Rix 12/28/2019, 11:30 AM

## 2019-12-28 NOTE — Progress Notes (Signed)
[place] PHYSICAL MEDICINE & REHABILITATION PROGRESS NOTE   Subjective/Complaints: She met with neuropsych today and had a good conversation. Pain has been better controlled- she asked whether she can receive Tylenol more frequently if needed so I have increased frequency to q4H prn.   ROS- denies CP, SOB, N/V/D  Objective:   No results found. No results for input(s): WBC, HGB, HCT, PLT in the last 72 hours. No results for input(s): NA, K, CL, CO2, GLUCOSE, BUN, CREATININE, CALCIUM in the last 72 hours.  Intake/Output Summary (Last 24 hours) at 12/28/2019 1057 Last data filed at 12/28/2019 0901 Gross per 24 hour  Intake 660 ml  Output --  Net 660 ml    Physical Exam: Vital Signs Blood pressure (!) 145/62, pulse 85, temperature 98.3 F (36.8 C), resp. rate 17, height 4' 3"  (1.295 m), weight 83.9 kg, SpO2 99 %. General: Alert and oriented x 3, No apparent distress HEENT: Head is normocephalic, atraumatic, PERRLA, EOMI, sclera anicteric, oral mucosa pink and moist, dentition intact, ext ear canals clear,  Neck: Supple without JVD or lymphadenopathy Heart: Reg rate and rhythm. No murmurs rubs or gallops Chest: CTA bilaterally without wheezes, rales, or rhonchi; no distress Abdomen: Soft, non-tender, non-distended, bowel sounds positive. Extremities: No clubbing, cyanosis, or edema. Pulses are 2+ Skin: No evidence of breakdown, no evidence of rash Neurologic: Cranial nerves II through XII intact, motor strength is 5/5 in bilateral deltoid, bicep, tricep, grip, 4- hip flexor. s/p B BKA Sensory exam normal sensation to light touch and proprioception in bilateral upper and lower extremities Cerebellar exam normal finger to nose to finger as well as heel to shin in bilateral upper and lower extremities Musculoskeletal: B BKA- wound vacs removed without complications   Assessment/Plan: 1. Functional deficits secondary to B BKA which require 3+ hours per day of interdisciplinary  therapy in a comprehensive inpatient rehab setting.  Physiatrist is providing close team supervision and 24 hour management of active medical problems listed below.  Physiatrist and rehab team continue to assess barriers to discharge/monitor patient progress toward functional and medical goals  Care Tool:  Bathing    Body parts bathed by patient: Right arm, Left arm, Chest, Abdomen, Front perineal area, Buttocks, Right upper leg, Left upper leg, Face     Body parts n/a: Right lower leg, Left lower leg (B BKA)   Bathing assist Assist Level: Minimal Assistance - Patient > 75%     Upper Body Dressing/Undressing Upper body dressing   What is the patient wearing?: Pull over shirt, Bra    Upper body assist Assist Level: Supervision/Verbal cueing    Lower Body Dressing/Undressing Lower body dressing      What is the patient wearing?: Incontinence brief (Shorts.)     Lower body assist Assist for lower body dressing: Minimal Assistance - Patient > 75%     Toileting Toileting Toileting Activity did not occur Landscape architect and hygiene only): N/A (no void or bm)  Toileting assist Assist for toileting: Minimal Assistance - Patient > 75%     Transfers Chair/bed transfer  Transfers assist     Chair/bed transfer assist level: Contact Guard/Touching assist (slide board)     Locomotion Ambulation   Ambulation assist   Ambulation activity did not occur: Safety/medical concerns (New B BKA, B LEs NWB)          Walk 10 feet activity   Assist  Walk 10 feet activity did not occur: Safety/medical concerns  Walk 50 feet activity   Assist Walk 50 feet with 2 turns activity did not occur: Safety/medical concerns         Walk 150 feet activity   Assist Walk 150 feet activity did not occur: Safety/medical concerns         Walk 10 feet on uneven surface  activity   Assist Walk 10 feet on uneven surfaces activity did not occur: Safety/medical  concerns         Wheelchair     Assist Will patient use wheelchair at discharge?: Yes Type of Wheelchair: Manual Wheelchair activity did not occur: Safety/medical concerns (increased pain and anxiety during evalutaiton)  Wheelchair assist level: Supervision/Verbal cueing, Set up assist Max wheelchair distance: 177f    Wheelchair 50 feet with 2 turns activity    Assist    Wheelchair 50 feet with 2 turns activity did not occur: Safety/medical concerns   Assist Level: Set up assist, Supervision/Verbal cueing   Wheelchair 150 feet activity     Assist  Wheelchair 150 feet activity did not occur: Safety/medical concerns   Assist Level: Set up assist, Supervision/Verbal cueing   Blood pressure (!) 145/62, pulse 85, temperature 98.3 F (36.8 C), resp. rate 17, height 4' 3"  (1.295 m), weight 83.9 kg, SpO2 99 %.  Medical Problem List and Plan: 1.  Decreased functional mobility secondary to progressive bilateral lower extremity gangrenous changes with recent debridement and skin grafts.  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  Wound VAC as directed             -patient may not shower until wound VACs are off after 7 days, DC 10/13             -ELOS/Goals: 2 weeks- supervision to min A  -Continue CIR 2.  Antithrombotics: -DVT/anticoagulation: Eliquis             -antiplatelet therapy: Plavix 3. Pain Management: Pt emphatic doesn't want gabapentin- is willing to try Duloxetine- not allergic to it- will try 30 mg QHS x4 days then 60 mg QHS;  oxycodone as needed  10/12: continues to have that burning pain.   10/13: discussed pros and cons of Cymbalta- she prefers to tolerate pain without medication at this time but would like to have it ordered if needed.   10/15: pain is currently well controlled with Tylenol and Oxycodone.   4. Mood: Provide emotional support             -antipsychotic agents: N/A 5. Neuropsych: This patient is capable of making decisions on her own  behalf. 6. Skin/Wound Care: Routine skin checks- has Stage I vs Stage II between buttocks- have nurse reassess- pt couldn't hold position to assess-  Wound vac removed on 100/17without complications 7. Fluids/Electrolytes/Nutrition: Routine in and outs with follow-up chemistries 8.  Acute blood loss anemia.  Hgb up to 9.9 on 10/11 9.  Rheumatoid arthritis.  Chronic prednisone 10.  CAD status post cardiac catheterization August 2021 underwent rotational atherectomy assisted multivessel PCI.  Continue Plavix and Eliquis x12 months 11.  CKD stage IIIb.  Baseline creatinine 1.37 12.  Diastolic congestive heart failure.  Monitor for any signs of fluid overload.  Continue Bumex 2 mg daily 13.  Hypertension.  Lopressor 12.5 mg twice daily.  Monitor with increased mobility Vitals:   12/27/19 2014 12/28/19 0507  BP: (!) 146/60 (!) 145/62  Pulse: (!) 108 85  Resp:    Temp:  98.3 F (36.8 C)  SpO2: 99% 99%  10/15: BP and HR have been labile- continue to monitor TID.  14.  Diabetes mellitus TYPE I, not II with peripheral neuropathy.  Hemoglobin A1c 6.3.  Patient with insulin pump prior to admission.  Follow-up diabetic coordinator- will con't insulin pump, since pt has managed for >60 years.  CBG (last 3)  Recent Labs    12/27/19 2127 12/28/19 0246 12/28/19 0627  GLUCAP 79 128* 134*  10/12: uncontrolled this morning to 236. 42 on 10/11 evening. Discussed her insulin pump. She will take 2U less than before.   10/13: she discussed with her endocroniologist and they have decreased her basal rate by 20% with good results.  10/15: CBGs are better controlled: range from 79 to 242.   15. Constipation- 1st BM since admission was today- added Senokot 1 tab daily- also on Dulcolax pills- for 3 days; might need Sorbitol, etc?? 16. Urinary and bowel urgency- is chronic for pt- has bedpan right next to her on bed.  17. Itching in residual limbs: benadryl cream ordered and is helping    LOS: 5 days A FACE  TO FACE EVALUATION WAS PERFORMED  Martha Clan P Nahom Carfagno 12/28/2019, 10:57 AM

## 2019-12-28 NOTE — Progress Notes (Signed)
Physical Therapy Session Note  Patient Details  Name: Kaitlin Hudson MRN: [id number] Date of Birth: 06/28/1955  Today's Date: 12/28/2019 PT Individual Time: 0800-0900 PT Individual Time Calculation (min): 60 min   Short Term Goals: Week 1:  PT Short Term Goal 1 (Week 1): Patient will perform bed mobility with supervision. PT Short Term Goal 2 (Week 1): Patient will perform pressure relief independently when seated in w/c. PT Short Term Goal 3 (Week 1): Patient will perform basic transfers with min A. PT Short Term Goal 4 (Week 1): Patient will propel w/c >100 feet with supervision. Week 2:    Week 3:     Skilled Therapeutic Interventions/Progress Updates:    PAIN:  3/10 residual limbs, received pain meds am, rest given as needed   Pt initially seated in bed receiving pain meds.  Requests to use BSC.  Performs forward scoot to [facility] w/poor clearance/boosting, significant shear but performs w/set up and supervision. Pt continent of bowels and bladder and performs perineal care and washing w/set up assist on BSC Pt also doffs nightshirt and dons clean shirt w/set up only. BSC to bed post scoot as above. Pt dons brief w/min assist from therapist, rolls L/R w/rails.  Dons shorts w/min assist. Supine to sit w/rails and cga.    Reviewed wc parts/operation/set up for SBT .  Performs SB transfer bed to wc w/set up and cga.   wc propulsion including backing in room and straight path w/several turns x 162ft w/supervision, min cuing for efficiency  wc to mat anterior scoot w/cues for set up and cga.  Pt provided w/dysom to secure wc cushion  Seated Mat therx LAQs 2 x 15 each [place] twist w/4.4lb ball 2x15 Hip abd 2x215 w/green TB resistance  SBT mat to wc w/cga and cues.  wc propulsion x 147ft w/bilat UEs as above. Pt left oob in wc w/alarm belt set and needs in reach  Pt w/gradually improving confidence w/SBTs Therapy Documentation Precautions:  Precautions Precautions: Fall, Other  (comment) Precaution Comments: x2 wound vacs Restrictions Weight Bearing Restrictions: Yes RLE Weight Bearing: Non weight bearing LLE Weight Bearing: Non weight bearing    Therapy/Group: Individual Therapy  Rada Hay, PT   [name] 12/28/2019, 12:35 PM

## 2019-12-28 NOTE — Progress Notes (Signed)
Occupational Therapy Note  Patient Details  Name: Kaitlin Hudson MRN: [id number] Date of Birth: [birth year]  The following the equipment is recommended by occupational therapy to increase pt's ability to perform ADL and decr burden of care: Bariatric drop arm BSC due to body habitus and bilateral BKAs.     Jamaul Heist A Riordan Walle 12/28/2019, 3:44 PM

## 2019-12-28 NOTE — Progress Notes (Signed)
Patient ID: Lyndia Bury, female   DOB: 28-Jun-1955, 65 y.o.   MRN: [id number]  SW cancelled ELRs order as per PT pt has what is needed. SW ordered bariatric DABSC with [employer] via parachute (private purchase).  Cecile Sheerer, MSW, LCSWA Office: [phone] Cell: [phone] Fax: [phone]

## 2019-12-28 NOTE — Progress Notes (Signed)
Occupational Therapy Session Note  Patient Details  Name: Kaitlin Hudson MRN: [id number] Date of Birth: [birth year]  Today's Date: 12/28/2019 OT Individual Time: 1445-1535 OT Individual Time Calculation (min): 50 min   Short Term Goals: Week 1:  OT Short Term Goal 1 (Week 1): Pt will complete BSC transfer with min A OT Short Term Goal 2 (Week 1): Pt will complete toileting tasks with min A OT Short Term Goal 3 (Week 1): Pt will don LB clothing with CGA OT Short Term Goal 4 (Week 1): Pt will correctly position BLE in bed with no more than min cueing  Skilled Therapeutic Interventions/Progress Updates:    Pt greeted in bed, calling a medical supply store about her equipment needs. She asked OT if she needed a drop arm bariatric BSC vs standard bariatric BSC. OT told her she needed the drop arm component to accommodate slideboard. Afterwards, pt reported that a TTB will not fit inside of her shower at home. They have a small shower chair and a walk in shower, husband [place] providing pictures during tx. OT advised her to wait until [facility] can assess the shower setup in the home environment and they can problem solve and practice safe methods together before attempting shower with only spouse assisting. Advised sponge bathing only before then. Pt verbalized understanding but expressed that she really wanted to shower and wash her hair. OT showed pt the hair washing tray and she was agreeable to have her hair washed this way at the sink today. Had pt first direct setup of w/c to her spouse [place] for slideboard transfer. She needed mod cuing for this. Close supervision for lateral scoot across board once pt positioned it with setup assist. Educated pt to lock her brakes prior to [place] applying her limb rests, Deniece Portela able to adjust them per pts comfort. After OT washed pts hair, pts affect visibly brightened. She wanted one for home so OT took a photo of the hair washing tray on Wayne's phone for them to purchase  independently. She also had concerns regarding transferring into bed at home, as the bed is ~28 inches high. They do not want to totally remove the boxspring mattress, but were agreeable to have their boxspring mattress modified to decrease the height of the bed. They stated they have friends who could do this. At end of session pt remained sitting up in the w/c with all needs within reach.   Therapy Documentation Precautions:  Precautions Precautions: Fall, Other (comment) Precaution Comments: x2 wound vacs Restrictions Weight Bearing Restrictions: Yes RLE Weight Bearing: Non weight bearing LLE Weight Bearing: Non weight bearing Pain: no c/o pain during tx   ADL: ADL Eating: Set up Where Assessed-Eating: Bed level Grooming: Supervision/safety Where Assessed-Grooming: Bed level Upper Body Bathing: Supervision/safety Where Assessed-Upper Body Bathing: Bed level Lower Body Bathing: Minimal assistance Where Assessed-Lower Body Bathing: Bed level Upper Body Dressing: Supervision/safety Where Assessed-Upper Body Dressing: Bed level Lower Body Dressing: Moderate assistance Where Assessed-Lower Body Dressing: Bed level Toileting: Moderate assistance Where Assessed-Toileting: Wheelchair (using urinal) Toilet Transfer: Unable to assess Tub/Shower Transfer: Unable to assess Tub/Shower Transfer Method: Unable to assess [employer] Transfer: Unable to assess [employer] Method: Unable to assess :     Therapy/Group: Individual Therapy  Chloris Marcoux A Dianah Pruett 12/28/2019, 4:18 PM

## 2019-12-29 LAB — GLUCOSE, CAPILLARY
Glucose-Capillary: 165 mg/dL — ABNORMAL HIGH (ref 70–99)
Glucose-Capillary: 166 mg/dL — ABNORMAL HIGH (ref 70–99)
Glucose-Capillary: 169 mg/dL — ABNORMAL HIGH (ref 70–99)
Glucose-Capillary: 63 mg/dL — ABNORMAL LOW (ref 70–99)
Glucose-Capillary: 162 mg/dL — ABNORMAL HIGH (ref 70–99)
Glucose-Capillary: 90 mg/dL (ref 70–99)

## 2019-12-30 LAB — GLUCOSE, CAPILLARY
Glucose-Capillary: 162 mg/dL — ABNORMAL HIGH (ref 70–99)
Glucose-Capillary: 135 mg/dL — ABNORMAL HIGH (ref 70–99)
Glucose-Capillary: 117 mg/dL — ABNORMAL HIGH (ref 70–99)
Glucose-Capillary: 171 mg/dL — ABNORMAL HIGH (ref 70–99)
Glucose-Capillary: 55 mg/dL — ABNORMAL LOW (ref 70–99)
Glucose-Capillary: 76 mg/dL (ref 70–99)

## 2019-12-30 NOTE — Progress Notes (Signed)
Occupational Therapy Session Note  Patient Details  Name: Kaitlin Hudson MRN: [id number] Date of Birth: 1955/09/06  Today's Date: 12/30/2019 OT Individual Time: 1500-1530 OT Individual Time Calculation (min): 30 min    Short Term Goals: Week 1:  OT Short Term Goal 1 (Week 1): Pt will complete BSC transfer with min A OT Short Term Goal 2 (Week 1): Pt will complete toileting tasks with min A OT Short Term Goal 3 (Week 1): Pt will don LB clothing with CGA OT Short Term Goal 4 (Week 1): Pt will correctly position BLE in bed with no more than min cueing  Skilled Therapeutic Interventions/Progress Updates:    1:1 Pt up and in w/c.  Lengthy discussion with husband and pt about shower stall transfers with their current set up at home. Recommended using a tub bench for transfer/ She could perform A/P transfer on/ off tub bench from her power chair. They thought all legs were inside the shower - so discussed proper set up for success.  Agreed to use a curtain (with doors still up) with a tension rod. Depth of bench is 16 in - husband plans on measuring shower opening to determine a/p transfer v lateral. He bought a hand held shower head already. Encouraged her to shower here to see if she likes the setup  And is successful with lateral leans in the shower.   Husband has modified power chair to accomodate her Legs to promote extension of knees to prevent contractures.    Therapy Documentation Precautions:  Precautions Precautions: Fall, Other (comment) Precaution Comments: x2 wound vacs Restrictions Weight Bearing Restrictions: Yes RLE Weight Bearing: Non weight bearing LLE Weight Bearing: Non weight bearing Pain: No c/o pain  Therapy/Group: Individual Therapy  [name] [facility] 12/30/2019, 3:56 PM

## 2019-12-30 NOTE — Progress Notes (Signed)
Hypoglycemic Event  CBG: 55   Treatment: 4 oz juice and candy bar in room   Symptoms: none   Follow-up CBG: Time:22:23 CBG Result:76  Possible Reasons for Event: poor appetite, low oral intake, improper bolus administration  Comments/MD notified Jesusita Oka, [place]    Shaune Leeks

## 2019-12-30 NOTE — Progress Notes (Signed)
[place] PHYSICAL MEDICINE & REHABILITATION PROGRESS NOTE   Subjective/Complaints:  Pt states she has not had dressing changed since would vac came out 10/13, discussed with RN, pt declined drsg change on 10/16 No severe pain in LEs ROS- denies CP, SOB, N/V/D  Objective:   No results found. No results for input(s): WBC, HGB, HCT, PLT in the last 72 hours. No results for input(s): NA, K, CL, CO2, GLUCOSE, BUN, CREATININE, CALCIUM in the last 72 hours.  Intake/Output Summary (Last 24 hours) at 12/30/2019 0843 Last data filed at 12/29/2019 0900 Gross per 24 hour  Intake 236 ml  Output --  Net 236 ml    Physical Exam: Vital Signs Blood pressure (!) 167/72, pulse 98, temperature 97.7 F (36.5 C), temperature source Oral, resp. rate 20, height 4\' 3"  (1.295 m), weight 83.9 kg, SpO2 100 %.  General: No acute distress Mood and affect are appropriate Heart: Regular rate and rhythm no rubs murmurs or extra sounds Lungs: Clear to auscultation, breathing unlabored, no rales or wheezes Abdomen: Positive bowel sounds, soft nontender to palpation, nondistended Extremities: No clubbing, cyanosis, or edema     Skin: No evidence of breakdown, no evidence of rash Neurologic: Cranial nerves II through XII intact, motor strength is 5/5 in bilateral deltoid, bicep, tricep, grip, 4- hip flexor. s/p B BKA Sensory exam normal sensation to light touch and proprioception in bilateral upper and lower extremities Cerebellar exam normal finger to nose to finger as well as heel to shin in bilateral upper and lower extremities Musculoskeletal: B BKA-    Assessment/Plan: 1. Functional deficits secondary to B BKA which require 3+ hours per day of interdisciplinary therapy in a comprehensive inpatient rehab setting.  Physiatrist is providing close team supervision and 24 hour management of active medical problems listed below.  Physiatrist and rehab team continue to assess barriers to  discharge/monitor patient progress toward functional and medical goals  Care Tool:  Bathing    Body parts bathed by patient: Right arm, Left arm, Chest, Abdomen, Front perineal area, Buttocks, Right upper leg, Left upper leg, Face     Body parts n/a: Right lower leg, Left lower leg (B BKA)   Bathing assist Assist Level: Minimal Assistance - Patient > 75%     Upper Body Dressing/Undressing Upper body dressing   What is the patient wearing?: Pull over shirt, Bra    Upper body assist Assist Level: Supervision/Verbal cueing    Lower Body Dressing/Undressing Lower body dressing      What is the patient wearing?: Incontinence brief (Shorts.)     Lower body assist Assist for lower body dressing: Minimal Assistance - Patient > 75%     Toileting Toileting Toileting Activity did not occur and hygiene only): N/A (no void or bm)  Toileting assist Assist for toileting: Minimal Assistance - Patient > 75%     Transfers Chair/bed transfer  Transfers assist     Chair/bed transfer assist level: Contact Guard/Touching assist Chair/bed transfer assistive device: Sliding board   Locomotion Ambulation   Ambulation assist   Ambulation activity did not occur: Safety/medical concerns (New B BKA, B LEs NWB)          Walk 10 feet activity   Assist  Walk 10 feet activity did not occur: Safety/medical concerns        Walk 50 feet activity   Assist Walk 50 feet with 2 turns activity did not occur: Safety/medical concerns  Walk 150 feet activity   Assist Walk 150 feet activity did not occur: Safety/medical concerns         Walk 10 feet on uneven surface  activity   Assist Walk 10 feet on uneven surfaces activity did not occur: Safety/medical concerns         Wheelchair     Assist Will patient use wheelchair at discharge?: Yes Type of Wheelchair: Manual Wheelchair activity did not occur: Safety/medical concerns  (increased pain and anxiety during evalutaiton)  Wheelchair assist level: Supervision/Verbal cueing Max wheelchair distance: 150ft    Wheelchair 50 feet with 2 turns activity    Assist    Wheelchair 50 feet with 2 turns activity did not occur: Safety/medical concerns   Assist Level: Supervision/Verbal cueing   Wheelchair 150 feet activity     Assist  Wheelchair 150 feet activity did not occur: Safety/medical concerns   Assist Level: Supervision/Verbal cueing   Blood pressure (!) 167/72, pulse 98, temperature 97.7 F (36.5 C), temperature source Oral, resp. rate 20, height 4\' 3"  (1.295 m), weight 83.9 kg, SpO2 100 %.  Medical Problem List and Plan: 1.  Decreased functional mobility secondary to progressive bilateral lower extremity gangrenous changes with recent debridement and skin grafts.  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  Wound VAC as directed             -patient may not shower until wound VACs are off after 7 days, DC 10/13             -ELOS/Goals: 2 weeks- supervision to min A  -Continue CIR 2.  Antithrombotics: -DVT/anticoagulation: Eliquis             -antiplatelet therapy: Plavix 3. Pain Management: Pt emphatic doesn't want gabapentin- is willing to try Duloxetine- not allergic to it- will try 30 mg QHS x4 days then 60 mg QHS;  oxycodone as needed  10/12: continues to have that burning pain.   10/13: discussed pros and cons of Cymbalta- she prefers to tolerate pain without medication at this time but would like to have it ordered if needed.   10/15: pain is currently well controlled with Tylenol and Oxycodone.   4. Mood: Provide emotional support             -antipsychotic agents: N/A 5. Neuropsych: This patient is capable of making decisions on her own behalf. 6. Skin/Wound Care: Routine skin checks- has Stage I vs Stage II between buttocks- have nurse reassess- pt couldn't hold position to assess-  Wound vac removed on 10/13 without complications 7.  Fluids/Electrolytes/Nutrition: Routine in and outs with follow-up chemistries 8.  Acute blood loss anemia.  Hgb up to 9.9 on 10/11 9.  Rheumatoid arthritis.  Chronic prednisone 10.  CAD status post cardiac catheterization August 2021 underwent rotational atherectomy assisted multivessel PCI.  Continue Plavix and Eliquis x12 months 11.  CKD stage IIIb.  Baseline creatinine 1.37 12.  Diastolic congestive heart failure.  Monitor for any signs of fluid overload.  Continue Bumex 2 mg daily 13.  Hypertension.  Lopressor 12.5 mg twice daily.  Monitor with increased mobility Vitals:   12/29/19 2228 12/30/19 0317  BP: 140/66 (!) 167/72  Pulse: 74 98  Resp: 17 20  Temp: 98.2 F (36.8 C) 97.7 F (36.5 C)  SpO2: 100% 100%  10/15: BP and HR have been labile- continue to monitor TID. Increase lopressor  To 25mg  BID 14.  Diabetes mellitus TYPE I, not II with peripheral neuropathy.  Hemoglobin A1c 6.3.  Patient with insulin pump prior to admission.  Follow-up diabetic coordinator- will con't insulin pump, since pt has managed for >60 years.  CBG (last 3)  Recent Labs    12/29/19 2058 12/30/19 0307 12/30/19 0603  GLUCAP 90 162* 135*  10/12: uncontrolled this morning to 236. 42 on 10/11 evening. Discussed her insulin pump. She will take 2U less than before.   10/13: she discussed with her endocroniologist and they have decreased her basal rate by 20% with good results.  10/15: CBGs are better controlled: range from 79 to 242.   15. Constipation- 1st BM since admission was today- added Senokot 1 tab daily- also on Dulcolax pills- for 3 days; might need Sorbitol, etc?? 16. Urinary and bowel urgency- is chronic for pt- has bedpan right next to her on bed.  17. Itching in residual limbs: benadryl cream ordered and is helping    LOS: 7 days A FACE TO FACE EVALUATION WAS PERFORMED  Erick Colace 12/30/2019, 8:43 AM

## 2019-12-31 ENCOUNTER — Inpatient Hospital Stay (HOSPITAL_COMMUNITY): Payer: Medicare Other

## 2019-12-31 DIAGNOSIS — G546 Phantom limb syndrome with pain: Secondary | ICD-10-CM

## 2019-12-31 DIAGNOSIS — I5032 Chronic diastolic (congestive) heart failure: Secondary | ICD-10-CM

## 2019-12-31 DIAGNOSIS — I739 Peripheral vascular disease, unspecified: Secondary | ICD-10-CM

## 2019-12-31 DIAGNOSIS — E669 Obesity, unspecified: Secondary | ICD-10-CM

## 2019-12-31 DIAGNOSIS — E1169 Type 2 diabetes mellitus with other specified complication: Secondary | ICD-10-CM

## 2019-12-31 LAB — GLUCOSE, CAPILLARY
Glucose-Capillary: 94 mg/dL (ref 70–99)
Glucose-Capillary: 128 mg/dL — ABNORMAL HIGH (ref 70–99)
Glucose-Capillary: 82 mg/dL (ref 70–99)
Glucose-Capillary: 158 mg/dL — ABNORMAL HIGH (ref 70–99)
Glucose-Capillary: 139 mg/dL — ABNORMAL HIGH (ref 70–99)

## 2019-12-31 NOTE — Progress Notes (Signed)
Occupational Therapy Weekly Progress Note  Patient Details  Name: Kaitlin Hudson MRN: [id number] Date of Birth: Aug 14, 1955  Beginning of progress report period: December 24, 2019 End of progress report period: December 31, 2019  Today's Date: 12/31/2019 OT Individual Time: 0623-7628 OT Individual Time Calculation (min): 55 min    Patient has met 3 of 4 short term goals.  Pt is making good progress toward her ADL goals. Pt is able to complete toileting at bed level (her preference at night) with mod I using a bed pan. She is able to complete ADL transfers with CGA. LB tasks completed with lateral leans. LB clothing management and toileting tasks with min A. Pt is still often limited by reduced self-efficacy, confidence, and decreased mood re condition. Pt's husband is present and very supportive, already completing hands on training.   Patient continues to demonstrate the following deficits: muscle weakness, decreased cardiorespiratoy endurance and decreased sitting balance, decreased postural control and decreased balance strategies and therefore will continue to benefit from skilled OT intervention to enhance overall performance with BADL and Reduce care partner burden.  Patient progressing toward long term goals..  Continue plan of care.  OT Short Term Goals Week 1:  OT Short Term Goal 1 (Week 1): Pt will complete BSC transfer with min A OT Short Term Goal 1 - Progress (Week 1): Met OT Short Term Goal 2 (Week 1): Pt will complete toileting tasks with min A OT Short Term Goal 2 - Progress (Week 1): Met OT Short Term Goal 3 (Week 1): Pt will don LB clothing with CGA OT Short Term Goal 3 - Progress (Week 1): Met OT Short Term Goal 4 (Week 1): Pt will correctly position BLE in bed with no more than min cueing OT Short Term Goal 4 - Progress (Week 1): Progressing toward goal Week 2:  OT Short Term Goal 1 (Week 2): STG= LTG d/t ELOS  Skilled Therapeutic Interventions/Progress Updates:    Pt  received sitting EOB with mild pain in her B residual limbs, no request for intervention. Pt reported having a difficult day mentally yesterday and encouragement/ supportive listening provided while discussing recovery, limb loss, and future prosthetic use. Discussed shower transfer again and pt initially agreeable but upon looking realized she did not have her insulin pump shower cover, so deferred to tomorrow. Pt was set up for bathing EOB with pan of water. She was able to complete all UB bathing and dressing with set up assist.  Pt completed anterior transfer to bariatric [facility] with CGA for equipment stabilization. She doffed underwear and completed peri hygiene with CGA. She required min A to pull up pants thoroughly with lateral leans. Pt returned to the bed posteriorly with CGA. She then completed lateral scoot into recliner with CGA. While seated, shrinkers and non-adherent dressings were changed with max A- pt able to direct care well, stating her husband will do this care for her at home. Very slight drainage present. Pt was left sitting up in the recliner with all needs met.   Therapy Documentation Precautions:  Precautions Precautions: Fall, Other (comment) Precaution Comments: x2 wound vacs Restrictions Weight Bearing Restrictions: Yes RLE Weight Bearing: Non weight bearing LLE Weight Bearing: Non weight bearing  Therapy/Group: Individual Therapy  Curtis Sites 12/31/2019, 8:04 AM

## 2019-12-31 NOTE — Plan of Care (Signed)
  Problem: Consults Goal: RH LIMB LOSS PATIENT EDUCATION Description: Description: See Patient Education module for eduction specifics. Outcome: Progressing Goal: Skin Care Protocol Initiated - if Braden Score 18 or less Description: If consults are not indicated, leave blank or document N/A Outcome: Progressing Goal: Diabetes Guidelines if Diabetic/Glucose > 140 Description: If diabetic or lab glucose is > 140 mg/dl - Initiate Diabetes/Hyperglycemia Guidelines & Document Interventions  Outcome: Progressing   Problem: RH BOWEL ELIMINATION Goal: RH STG MANAGE BOWEL WITH ASSISTANCE Description: STG Manage Bowel with Min Assistance. Outcome: Progressing   Problem: RH BLADDER ELIMINATION Goal: RH STG MANAGE BLADDER WITH ASSISTANCE Description: STG Manage Bladder With Min Assistance Outcome: Progressing   Problem: RH SKIN INTEGRITY Goal: RH STG SKIN FREE OF INFECTION/BREAKDOWN Description: Skin to remain free from infection and breakdown with min assist while on CIR. Outcome: Progressing Goal: RH STG ABLE TO PERFORM INCISION/WOUND CARE W/ASSISTANCE Description: STG Able To Perform Incision/Wound Care With min Assistance. Outcome: Progressing   Problem: RH SAFETY Goal: RH STG ADHERE TO SAFETY PRECAUTIONS W/ASSISTANCE/DEVICE Description: STG Adhere to Safety Precautions With min Assistance at wheelchair level. Outcome: Progressing   Problem: RH PAIN MANAGEMENT Goal: RH STG PAIN MANAGED AT OR BELOW PT'S PAIN GOAL Description: <3 on a 0-10 pain scale. Outcome: Progressing   Problem: RH KNOWLEDGE DEFICIT LIMB LOSS Goal: RH STG INCREASE KNOWLEDGE OF SELF CARE AFTER LIMB LOSS Description: Patient will be able to demonstrate knowledge of medication management, diabetes management, wound care management, weight bearing precautions, and follow up care with the MD with min assist from CIR staff at discharge. Outcome: Progressing

## 2019-12-31 NOTE — Progress Notes (Addendum)
Patient ID: Mina Babula, female   DOB: [birth year], 64 y.o.   MRN: [id number]  SW ordered transfer board with [employer] via parachute.   SW met with pt and pt husband in room to inform on DME item ordered. SW provided pt with HHA list for review. Preferred [facility] is [facility] as have used in the past. Husband reports that he has purchased a handheld shower head, bed rails, and will try to exchange shower seat for TTB. Reports he picked up bariatric DABSC from warehouse on Friday.   SW sent HHPT/?OT/?SN referral to [facility]. SW waiting on follow-up.   Loralee Pacas, MSW, [place] Office: [phone] Cell: [phone] Fax: [phone]

## 2019-12-31 NOTE — Progress Notes (Signed)
Physical Therapy Session Note  Patient Details  Name: Rether Rison MRN: [id number] Date of Birth: 1956/02/16  Today's Date: 12/31/2019 PT Individual Time: 1015-1130 PT Individual Time Calculation (min): 75 min   Short Term Goals: Week 1:  PT Short Term Goal 1 (Week 1): Patient will perform bed mobility with supervision. PT Short Term Goal 2 (Week 1): Patient will perform pressure relief independently when seated in w/c. PT Short Term Goal 3 (Week 1): Patient will perform basic transfers with min A. PT Short Term Goal 4 (Week 1): Patient will propel w/c >100 feet with supervision. Week 2:    Week 3:     Skilled Therapeutic Interventions/Progress Updates:    PAIN  5/10 distal residual limbs, rest as needed  Pt initially oob in recliner, states she doesn't "feel great" today.  Pt checked blood sugar herself, 127.  Transfer training: Recliner to wc via SBT w/set up, cues, and cga. Pt dons wc gloves independently  wc propulsion 141ft mod I, transported remaining distance to ortho gym due to UE discomfort  Car transfer: Therapist demonstrated set up and adjusted car to approx height of ptatients car. Pt then required mod assist for set up and board placement.  wc to car SBT w/cga, cues for safe hand placement/not to grab steering wheel. Car to wc w/set up, cga  wc to/from mat w/cues for set up (forgets to flip back armrest) and cga w/uphill bias to mat.  Mat therex Core strength  Russian twist w/4.4lb nball 2x15 Seated LAQs 2x25 Sit to supine w/supervision Supine bridge w/bolster 2x10 w/3 sec hold  S/l hip abd x15 S. Hip extension x 10 Rolls L/R on mat w/cues only Supine to sit w/super;vision  Pt became tearful and repeated "I am too old to have to go thru this"  "It just isnt fair".  Emotional support provided and therapist discussed signficant funtional improvements w/transfers/mobility achieved and plan for dc home in one week.    Mat to wc w/cga, cues for set up. wc  propulsion 135ft w/supervision  wc to recliner SBT w/set up assist, cga. Pt left oob in recliner w/chair alarm set and needs in reach.  Therapy Documentation Precautions:  Precautions Precautions: Fall, Other (comment) Precaution Comments: x2 wound vacs Restrictions Weight Bearing Restrictions: Yes RLE Weight Bearing: Non weight bearing LLE Weight Bearing: Non weight bearing   Therapy/Group: Individual Therapy  Rada Hay, PT   [name] 12/31/2019, 12:34 PM

## 2019-12-31 NOTE — Progress Notes (Signed)
[place] PHYSICAL MEDICINE & REHABILITATION PROGRESS NOTE   Subjective/Complaints:  Legs sore, mood poor d/t recent health events, pain.  ROS: Patient denies fever, rash, sore throat, blurred vision, nausea, vomiting, diarrhea, cough, shortness of breath or chest pain,  headache, or mood change.    Objective:   No results found. No results for input(s): WBC, HGB, HCT, PLT in the last 72 hours. No results for input(s): NA, K, CL, CO2, GLUCOSE, BUN, CREATININE, CALCIUM in the last 72 hours.  Intake/Output Summary (Last 24 hours) at 12/31/2019 1302 Last data filed at 12/31/2019 0900 Gross per 24 hour  Intake 10 ml  Output --  Net 10 ml    Physical Exam: Vital Signs Blood pressure (!) 156/64, pulse 97, temperature 98.1 F (36.7 C), temperature source Oral, resp. rate 18, height 4\' 3"  (1.295 m), weight 83.9 kg, SpO2 99 %.  Constitutional: No distress . Vital signs reviewed. HEENT: EOMI, oral membranes moist Neck: supple Cardiovascular: RRR without murmur. No JVD    Respiratory/Chest: CTA Bilaterally without wheezes or rales. Normal effort    GI/Abdomen: BS +, non-tender, non-distended Ext: no clubbing, cyanosis, or edema Psych: pleasant and cooperative Skin: legs c/w below  Bruising left eye Neurologic: Cranial nerves II through XII intact, motor strength is 5/5 in bilateral deltoid, bicep, tricep, grip, 4- hip flexor. Sensory exam intact Musculoskeletal: B BKA- shrinkers fitting appropriately, both legs tender   Assessment/Plan: 1. Functional deficits secondary to B BKA which require 3+ hours per day of interdisciplinary therapy in a comprehensive inpatient rehab setting.  Physiatrist is providing close team supervision and 24 hour management of active medical problems listed below.  Physiatrist and rehab team continue to assess barriers to discharge/monitor patient progress toward functional and medical goals  Care Tool:  Bathing    Body parts bathed by patient:  Right arm, Left arm, Chest, Abdomen, Front perineal area, Buttocks, Right upper leg, Left upper leg, Face     Body parts n/a: Right lower leg, Left lower leg (B BKA)   Bathing assist Assist Level: Minimal Assistance - Patient > 75%     Upper Body Dressing/Undressing Upper body dressing   What is the patient wearing?: Pull over shirt, Bra    Upper body assist Assist Level: Supervision/Verbal cueing    Lower Body Dressing/Undressing Lower body dressing      What is the patient wearing?: Incontinence brief (Shorts.)     Lower body assist Assist for lower body dressing: Minimal Assistance - Patient > 75%     Toileting Toileting Toileting Activity did not occur and hygiene only): N/A (no void or bm)  Toileting assist Assist for toileting: Minimal Assistance - Patient > 75%     Transfers Chair/bed transfer  Transfers assist     Chair/bed transfer assist level: Contact Guard/Touching assist Chair/bed transfer assistive device: Sliding board   Locomotion Ambulation   Ambulation assist   Ambulation activity did not occur: Safety/medical concerns (New B BKA, B LEs NWB)          Walk 10 feet activity   Assist  Walk 10 feet activity did not occur: Safety/medical concerns        Walk 50 feet activity   Assist Walk 50 feet with 2 turns activity did not occur: Safety/medical concerns         Walk 150 feet activity   Assist Walk 150 feet activity did not occur: Safety/medical concerns         Walk 10 feet  on uneven surface  activity   Assist Walk 10 feet on uneven surfaces activity did not occur: Safety/medical concerns         Wheelchair     Assist Will patient use wheelchair at discharge?: Yes Type of Wheelchair: Manual Wheelchair activity did not occur: Safety/medical concerns (increased pain and anxiety during evalutaiton)  Wheelchair assist level: Supervision/Verbal cueing Max wheelchair distance: 148ft     Wheelchair 50 feet with 2 turns activity    Assist    Wheelchair 50 feet with 2 turns activity did not occur: Safety/medical concerns   Assist Level: Supervision/Verbal cueing   Wheelchair 150 feet activity     Assist  Wheelchair 150 feet activity did not occur: Safety/medical concerns   Assist Level: Supervision/Verbal cueing   Blood pressure (!) 156/64, pulse 97, temperature 98.1 F (36.7 C), temperature source Oral, resp. rate 18, height 4\' 3"  (1.295 m), weight 83.9 kg, SpO2 99 %.  Medical Problem List and Plan: 1.  Decreased functional mobility secondary to progressive bilateral lower extremity gangrenous changes with recent debridement and skin grafts.  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  Wound VAC as directed             -patient may not shower until wound VACs are off after 7 days, DC 10/13             -ELOS/Goals: 2 weeks- supervision to min A  -Continue CIR 2.  Antithrombotics: -DVT/anticoagulation: Eliquis             -antiplatelet therapy: Plavix 3. Pain Management: cymbalta 60mg  daily for phantom pain.  -  oxycodone as needed  10/12: continues to have that burning pain.   10/13: discussed pros and cons of Cymbalta- she prefers to tolerate pain without medication at this time but would like to have it ordered if needed.   10/18: pain is adequatelycontrolled with Tylenol and Oxycodone.   4. Mood: Provide emotional support             -antipsychotic agents: N/A  -pt struggling with coping d/t amputations,pain. Appreciate input from Dr. Kieth Brightly.   -cymbalta already on board for pain  -don't know if [employer] has an amputee support volunteer-might be helpful 5. Neuropsych: This patient is capable of making decisions on her own behalf. 6. Skin/Wound Care: Routine skin checks- has Stage I vs Stage II between buttocks- have nurse reassess- pt couldn't hold position to assess-  Wound vac removed on 10/13 without complications 7. Fluids/Electrolytes/Nutrition:  Routine in and outs with follow-up chemistries 8.  Acute blood loss anemia.  Hgb up to 9.9 on 10/11 9.  Rheumatoid arthritis.  Chronic prednisone 10.  CAD status post cardiac catheterization August 2021 underwent rotational atherectomy assisted multivessel PCI.  Continue Plavix and Eliquis x12 months 11.  CKD stage IIIb.  Baseline creatinine 1.37 12.  Diastolic congestive heart failure.  Monitor for any signs of fluid overload.  Continue Bumex 2 mg daily  +3000 cc since admit Filed Weights   12/23/19 1300  Weight: 83.9 kg    -need new weight 10/18 13.  Hypertension.  Lopressor 12.5 mg twice daily.  Monitor with increased mobility Vitals:   12/30/19 1933 12/31/19 0500  BP: (!) 145/69 (!) 156/64  Pulse: 96 97  Resp: 18 18  Temp: 98.6 F (37 C) 98.1 F (36.7 C)  SpO2: 100% 99%  10/15: BP and HR have been labile- continue to monitor TID. Increase lopressor  To 25mg  BID 10/18 improved bp control 14.  Diabetes mellitus TYPE I, not II with peripheral neuropathy.  Hemoglobin A1c 6.3.  Patient with insulin pump prior to admission.  Follow-up diabetic coordinator- will con't insulin pump, since pt has managed for >60 years.  CBG (last 3)  Recent Labs    12/31/19 0259 12/31/19 0559 12/31/19 1136  GLUCAP 94 128* 82  10/12: uncontrolled this morning to 236. 42 on 10/11 evening. Discussed her insulin pump. She will take 2U less than before.   10/13: she discussed with her endocroniologist and they have decreased her basal rate by 20% with good results.  10/18: CBGs are better controlled  15. Constipation- 1st BM since admission was today- added Senokot 1 tab daily- also on Dulcolax pills  -had bm 10/18 16. Urinary and bowel urgency- is chronic for pt- has bedpan right next to her on bed.  17. Itching in residual limbs: benadryl cream ordered and is helping    LOS: 8 days A FACE TO FACE EVALUATION WAS PERFORMED  Ranelle Oyster 12/31/2019, 1:02 PM

## 2019-12-31 NOTE — Progress Notes (Signed)
Physical Therapy Weekly Progress Note  Patient Details  Name: Kaitlin Hudson MRN: [id number] Date of Birth: 1955/11/17  Beginning of progress report period: December 24, 2019 End of progress report period: December 31, 2019  Today's Date: 12/31/2019 PT Individual Time: 3419-6222 PT Individual Time Calculation (min): 47 min   Patient has met 3 of 4 short term goals.  Patient has made great progress this week. She currently performs bed mobility with supervision, level A/P and slide board transfers with CGA, w/c propulsion 150 ft with supervision, and has participated in amputee education daily in therapies. Patient has several unlevel transfers that she will need to navigate at home to complete ADLs and return to sleeping in her bed. Will focus on higher level transfers, d/c planning, amputee education, and hands on training with patient's husband this week in preparation for d/c.  Patient continues to demonstrate the following deficits muscle weakness and muscle joint tightness, decreased cardiorespiratoy endurance and decreased sitting balance, decreased postural control and decreased balance strategies and therefore will continue to benefit from skilled PT intervention to increase functional independence with mobility.  Patient progressing toward long term goals..  Continue plan of care.  PT Short Term Goals Week 1:  PT Short Term Goal 1 (Week 1): Patient will perform bed mobility with supervision. PT Short Term Goal 1 - Progress (Week 1): Met PT Short Term Goal 2 (Week 1): Patient will perform pressure relief independently when seated in w/c. PT Short Term Goal 2 - Progress (Week 1): Progressing toward goal PT Short Term Goal 3 (Week 1): Patient will perform basic transfers with min A. PT Short Term Goal 3 - Progress (Week 1): Met PT Short Term Goal 4 (Week 1): Patient will propel w/c >100 feet with supervision. PT Short Term Goal 4 - Progress (Week 1): Met Week 2:  PT Short Term Goal 1  (Week 2): STG=LTG due to ELOS.  Skilled Therapeutic Interventions/Progress Updates:     Patient in recliner with her husband in the room upon PT arrival. Patient reported increased fatigue and stomach upset this session, requesting to rest, rather than perform mobility at this time. Stated, "I just do not feel good today." Patient also reports that her CBG was in the 80's prior to lunch and that she did not have any appetite to eat any of her lunch today. Patient self-checked with personal equipment, CBG 220 at beginning of session. Patient reported 4/10 B residual limb pain during session, PT provided repositioning, rest breaks, and distraction as pain interventions throughout session. Reports pain was "out of control" prior to medication provided before session.  Focused session on amputee education and d/c planning with patient and her husband. Educated on importance of ROM (with emphasis on knee and hip extension). Patient with B LEs bent in recliner, reports this position helped her pain. Instructed patient to adjust position for pain managment, but not to maintain this position for any prolonged periods (>15 min). Patient reports that she has not been able to lie prone for several years. Encouraged side-lying with hip extension and lying flat in supine for hip extension ROM. Educated on pressure relief using lateral leans 5x5 sec every 30 min. Patient performed 1 set with full clearance of IT using UEs on the arm rests of the recliners. Also, educated on energy conservation techniques and variability of endurance and activity tolerance during recovery. Patient and her husband attentive and receptive to all education.  Discussed d/c planning, patient's husband is having someone build a  box spring for their bed to reduce the height to 23", the same height as the power chair they have. He has also created a secure modification for B residual limb elevation in the power chair to promote knee extension in  sitting (has picture on his phon). Plan for car transfer in personal vehicle on Wednesday at 1pm, scheduling made aware. Patient reports that the transfer in the car simulator went well in previous PT session today.   Patient in recliner with B LEs elevated and her husband in the room at end of session with breaks locked and all needs within reach. Patient missed 13 min of skilled PT due to fatigue/ill, RN made aware. Will attempt to make-up missed time as able.     Therapy Documentation Precautions:  Precautions Precautions: Fall, Other (comment) Precaution Comments: x2 wound vacs Restrictions Weight Bearing Restrictions: Yes RLE Weight Bearing: Non weight bearing LLE Weight Bearing: Non weight bearing General: PT Amount of Missed Time (min): 13 Minutes PT Missed Treatment Reason: Patient fatigue  Therapy/Group: Individual Therapy  Karne Ozga L Clarke Peretz PT, DPT  12/31/2019, 6:57 PM

## 2020-01-01 ENCOUNTER — Inpatient Hospital Stay (HOSPITAL_COMMUNITY): Payer: Medicare Other

## 2020-01-01 LAB — GLUCOSE, CAPILLARY
Glucose-Capillary: 41 mg/dL — CL (ref 70–99)
Glucose-Capillary: 69 mg/dL — ABNORMAL LOW (ref 70–99)
Glucose-Capillary: 100 mg/dL — ABNORMAL HIGH (ref 70–99)
Glucose-Capillary: 148 mg/dL — ABNORMAL HIGH (ref 70–99)
Glucose-Capillary: 115 mg/dL — ABNORMAL HIGH (ref 70–99)

## 2020-01-01 MED ORDER — DULOXETINE HCL 60 MG PO CPEP
60.0000 mg | ORAL_CAPSULE | Freq: Every day | ORAL | Status: DC
  Administered 2020-01-01 – 2020-01-05 (×4): 60 mg via ORAL
  Filled 2020-01-01 (×5): qty 1

## 2020-01-01 NOTE — Patient Care Conference (Signed)
Inpatient RehabilitationTeam Conference and Plan of Care Update Date: 01/01/2020   Time: 10:17 AM    Patient Name: Kaitlin Hudson      Medical Record Number: [id number]  Date of Birth: December 10, 1955 Sex: Female         Room/Bed: 4W19C/4W19C-01 Payor Info: Payor: MEDICARE / Plan: MEDICARE PART A AND B / Product Type: *No Product type* /    Admit Date/Time:  12/23/2019 11:51 AM  Primary Diagnosis:  S/P bilateral below knee amputation [facility])  Hospital Problems: Principal Problem:   S/P bilateral below knee amputation [facility])    Expected Discharge Date: Expected Discharge Date: 01/05/20  Team Members Present: Physician leading conference: Dr. Faith Rogue Care Coodinator Present: Cecile Sheerer, LCSWA;Ayanah Snader Marlyne Beards, RN, BSN, CRRN Nurse Present: Greta Doom, RN PT Present: Serina Cowper, PT OT Present: Jake Shark, OT PPS Coordinator present : Edson Snowball, Park Breed, SLP     Current Status/Progress Goal Weekly Team Focus  Bowel/Bladder   pt. continent of bowel and bladder LBM 12/31/19  To remain continent of bowel/bladder.  Asses toileting needs Q2 hours and PRN   Swallow/Nutrition/ Hydration             ADL's   CGA transfers, min A LB dressing, (S) UB dressing, CGA toileting tasks  (S) to mod I  ADL retraining, transfers, d/c planning, psychosocial adjustment   Mobility   Supervison-CGA overall  Mod I-supervision overall  Activity tolerance, level and unlevel transfers, personal car transfer, w/c mobility, amputee education, strength/ROM, sitting balance, patient/caregiver education   Communication             Safety/Cognition/ Behavioral Observations            Pain   Pain level of 5 no oxy needed this shift PRN tylenol for pain  To decrease pain level 2/10.  assess pain q4hours and PRN   Skin   Pressure injury to sacrun covered with foam Surgical incisions s/p bilateral BKA gauze, non adherent pads and stump shrinker dressings  Promote healing and  preventing further breakdown.  Assess skin qshift and PRN     Discharge Planning:  D/c to home with 24/7 care from husband.   Team Discussion: Blood sugars doing well with an occasional low, treating appropriately. Continent B/B, MASD to the bottom and being treated. OT reports contact guard assist for AP transfers, min assist for lower body, supervision assist for upper body. PT reports patient did 2 car transfers. Patient on target to meet rehab goals: yes  *See Care Plan and progress notes for long and short-term goals.   Revisions to Treatment Plan:  Please do daily weights.  Teaching Needs: Continue family education.  Current Barriers to Discharge: Wound care, Behavior and pain.  Possible Resolutions to Barriers: Teach wound care and dressing changes, continue with current medication regimen, and offer psychological support.     Medical Summary Current Status: bilateral BKA's. wounds clean, slight drainage. phantom pain. reactive mood decrease after BKA's---cymbalta  Barriers to Discharge: Medical stability   Possible Resolutions to [employer] Focus: daily wound mgt/obsv/dressing change. adjusting pain meds/ psychological support   Continued Need for Acute Rehabilitation Level of Care: The patient requires daily medical management by a physician with specialized training in physical medicine and rehabilitation for the following reasons: Direction of a multidisciplinary physical rehabilitation program to maximize functional independence : Yes Medical management of patient stability for increased activity during participation in an intensive rehabilitation regime.: Yes Analysis of laboratory values and/or radiology reports with any  subsequent need for medication adjustment and/or medical intervention. : Yes   I attest that I was present, lead the team conference, and concur with the assessment and plan of the team.   Tennis Must 01/01/2020, 1:39 PM

## 2020-01-01 NOTE — Plan of Care (Signed)
  Problem: Consults Goal: RH LIMB LOSS PATIENT EDUCATION Description: Description: See Patient Education module for eduction specifics. Outcome: Progressing Goal: Skin Care Protocol Initiated - if Braden Score 18 or less Description: If consults are not indicated, leave blank or document N/A Outcome: Progressing Goal: Diabetes Guidelines if Diabetic/Glucose > 140 Description: If diabetic or lab glucose is > 140 mg/dl - Initiate Diabetes/Hyperglycemia Guidelines & Document Interventions  Outcome: Progressing   Problem: RH BOWEL ELIMINATION Goal: RH STG MANAGE BOWEL WITH ASSISTANCE Description: STG Manage Bowel with Min Assistance. Outcome: Progressing   Problem: RH BLADDER ELIMINATION Goal: RH STG MANAGE BLADDER WITH ASSISTANCE Description: STG Manage Bladder With Min Assistance Outcome: Progressing   Problem: RH SKIN INTEGRITY Goal: RH STG SKIN FREE OF INFECTION/BREAKDOWN Description: Skin to remain free from infection and breakdown with min assist while on CIR. Outcome: Progressing Goal: RH STG ABLE TO PERFORM INCISION/WOUND CARE W/ASSISTANCE Description: STG Able To Perform Incision/Wound Care With min Assistance. Outcome: Progressing   Problem: RH SAFETY Goal: RH STG ADHERE TO SAFETY PRECAUTIONS W/ASSISTANCE/DEVICE Description: STG Adhere to Safety Precautions With min Assistance at wheelchair level. Outcome: Progressing   Problem: RH PAIN MANAGEMENT Goal: RH STG PAIN MANAGED AT OR BELOW PT'S PAIN GOAL Description: <3 on a 0-10 pain scale. Outcome: Progressing   Problem: RH KNOWLEDGE DEFICIT LIMB LOSS Goal: RH STG INCREASE KNOWLEDGE OF SELF CARE AFTER LIMB LOSS Description: Patient will be able to demonstrate knowledge of medication management, diabetes management, wound care management, weight bearing precautions, and follow up care with the MD with min assist from CIR staff at discharge. Outcome: Progressing   

## 2020-01-01 NOTE — Progress Notes (Signed)
Occupational Therapy Session Note  Patient Details  Name: Kaitlin Hudson MRN: [id number] Date of Birth: Sep 12, 1955  Today's Date: 01/01/2020 OT Individual Time: 5329-9242 OT Individual Time Calculation (min): 65 min    Short Term Goals: Week 2:  OT Short Term Goal 1 (Week 2): STG= LTG d/t ELOS  Skilled Therapeutic Interventions/Progress Updates:    Pt received supine with no c/o pain. Pt agreeable to take shower. Discussed home set up and pt called husband to apart of the conversation re shower transfer. Pt completed lateral scoot transfer to the bariatric BSC with slideboard, requiring more effort and assistance to steady slideboard- min A overall. Pt voided urine. Slideboard transfer BSC >  W/c > TTB with assist only for equipment stabilization and slideboard placement. Education/min cueing provided throughout for safety and energy conservation strategies. Pt able to doff clothing with lateral leans with min guard. Pt completed all bathing seated with lateral leans with close (S). Pt dried off with assist to place towel on bench underneath her. Pt completed UB dressing with set up assist. Min A to don pants- to pull up posteriorly. Slideboard transfer back to w/c with CGA. Pt reported fatigue and asked to rest before next PT session in 45 min. Last 10 min of session missed. Pt left sitting up with her residual limbs resting on the bed for comfort. All needs within reach.   Therapy Documentation Precautions:  Precautions Precautions: Fall, Other (comment) Precaution Comments: x2 wound vacs Restrictions Weight Bearing Restrictions: Yes RLE Weight Bearing: Non weight bearing LLE Weight Bearing: Non weight bearing  Therapy/Group: Individual Therapy  Crissie Reese 01/01/2020, 7:26 AM

## 2020-01-01 NOTE — Progress Notes (Signed)
Hypoglycemic Event  CBG: 69  Treatment: 4 oz apple juice    Symptoms: none noted   Follow-up CBG: Time:0440 CBG Result: 100  Possible Reasons for Event: low oral intake   Comments/MD notified: Md notified in AM     Shaune Leeks

## 2020-01-01 NOTE — Progress Notes (Signed)
Physical Therapy Session Note  Patient Details  Name: Kaitlin Hudson MRN: [id number] Date of Birth: 1955/06/21  Today's Date: 01/01/2020 PT Individual Time: 1135-1205 and 1400-1500 PT Individual Time Calculation (min): 30 min and 60 min  Short Term Goals: Week 1:  PT Short Term Goal 1 (Week 1): Patient will perform bed mobility with supervision. PT Short Term Goal 1 - Progress (Week 1): Met PT Short Term Goal 2 (Week 1): Patient will perform pressure relief independently when seated in w/c. PT Short Term Goal 2 - Progress (Week 1): Progressing toward goal PT Short Term Goal 3 (Week 1): Patient will perform basic transfers with min A. PT Short Term Goal 3 - Progress (Week 1): Met PT Short Term Goal 4 (Week 1): Patient will propel w/c >100 feet with supervision. PT Short Term Goal 4 - Progress (Week 1): Met Week 2:  PT Short Term Goal 1 (Week 2): STG=LTG due to ELOS. Week 3:     Skilled Therapeutic Interventions/Progress Updates:   AM SESSION  PAIN 4/10 distal limbs, treatment to tolerance, repositioning as needed.   Pt initially oob in wc, states she is fatigued due to taking shower in OT this am.  Agreed to session w/focus on HEP. Pt propelled wc 32f mod I, transported remaining distance due to fatigue. wc to mat SBT w/minimal cueing and close supervision. HEP performed including the following:  Access Code: [email or web address]: [email or web address] Date: 01/01/2020 Prepared by: BCallie Fielding Exercises Supine Single Leg Bridge with Sound Leg (AKA) - 1 x daily - 7 x weekly - 2 sets - 10 reps Sidelying Hip Extension in Abduction - 1 x daily - 7 x weekly - 2 sets - 15 reps Sidelying Hip Abduction - 1 x daily - 7 x weekly - 2 sets - 10 reps Seated Long Arc Quad - 1 x daily - 7 x weekly - 2 sets - 10 reps  Will provide w/printed out HEP in next session.  Supine to/from sit and sidelying on mat w/supervision. Mat to wc SBT w/min cues and close superivsion.  wc  propulsion x 1212fmod I.  Pt left oob in wc w/needs in reach.  Agreed to call for assistance from nursing for transfer to recliner when she was ready.    PM SESSION PAIN Rates as 4/10 distal limbs  Propels wc 15041fod I Reviewed printed hep  wc to mat w/supervision only SBT In sitting worked on core strength and dynamic balance via: Reaching for cones placed at max excursions on mat in reverse clock (behind pt) x 6 cones repeated 4 sets. Rested, repeated total of 4 rounds  Seated ball toss w/rebounder 1 min intervals x 4 w/supervision for cardiovascular conditioning and balance.  Seated overhead press w/2lb bar 2x15 Seated russian twist w/4.4lb ball 2x15 Seated kayak row w/2lb bar x 1 min  Mat to wc w/supervision SBT wc propulsion x [id number] I to room. Pt left seated in wc w/husband at side, LEs propped on bed for comfort.    Precautions:  Precautions Precautions: Fall, Other (comment) Precaution Comments: x2 wound vacs Restrictions Weight Bearing Restrictions: Yes RLE Weight Bearing: Non weight bearing LLE Weight Bearing: Non weight bearing    Therapy/Group: Individual Therapy  BarbCallie Fielding  [place]19/2021, 12:23 PM

## 2020-01-01 NOTE — Progress Notes (Signed)
[place] PHYSICAL MEDICINE & REHABILITATION PROGRESS NOTE   Subjective/Complaints:  No new complaints. Ongoing stump pain. Asked about timing of cymbalta dosing  ROS: Patient denies fever, rash, sore throat, blurred vision, nausea, vomiting, diarrhea, cough, shortness of breath or chest pain,  headache, or mood change.   Objective:   No results found. No results for input(s): WBC, HGB, HCT, PLT in the last 72 hours. No results for input(s): NA, K, CL, CO2, GLUCOSE, BUN, CREATININE, CALCIUM in the last 72 hours.  Intake/Output Summary (Last 24 hours) at 01/01/2020 1019 Last data filed at 01/01/2020 0900 Gross per 24 hour  Intake 220 ml  Output --  Net 220 ml    Physical Exam: Vital Signs Blood pressure (!) 145/65, pulse (!) 103, temperature 98.3 F (36.8 C), temperature source Oral, resp. rate 17, height 4\' 3"  (1.295 m), weight 70.8 kg, SpO2 98 %.  Constitutional: No distress . Vital signs reviewed. HEENT: EOMI, oral membranes moist Neck: supple Cardiovascular: RRR without murmur. No JVD    Respiratory/Chest: CTA Bilaterally without wheezes or rales. Normal effort    GI/Abdomen: BS +, non-tender, non-distended Ext: no clubbing, cyanosis, or edema Psych: pleasant but flat Skin: legs c/w below--slight serous drainage along lateral aspects of incisions  MASD along sacrum/buttocks Neurologic: Cranial nerves II through XII intact, motor strength is 5/5 in bilateral deltoid, bicep, tricep, grip, 4- hip flexor. Sensory exam intact Musculoskeletal: B BKA- shrinkers fitting appropriately, both legs tender   Assessment/Plan: 1. Functional deficits secondary to B BKA which require 3+ hours per day of interdisciplinary therapy in a comprehensive inpatient rehab setting.  Physiatrist is providing close team supervision and 24 hour management of active medical problems listed below.  Physiatrist and rehab team continue to assess barriers to discharge/monitor patient progress toward  functional and medical goals  Care Tool:  Bathing    Body parts bathed by patient: Right arm, Left arm, Chest, Abdomen, Front perineal area, Buttocks, Right upper leg, Left upper leg, Face     Body parts n/a: Right lower leg, Left lower leg (B BKA)   Bathing assist Assist Level: Minimal Assistance - Patient > 75%     Upper Body Dressing/Undressing Upper body dressing   What is the patient wearing?: Pull over shirt, Bra    Upper body assist Assist Level: Supervision/Verbal cueing    Lower Body Dressing/Undressing Lower body dressing      What is the patient wearing?: Incontinence brief (Shorts.)     Lower body assist Assist for lower body dressing: Minimal Assistance - Patient > 75%     Toileting Toileting Toileting Activity did not occur and hygiene only): N/A (no void or bm)  Toileting assist Assist for toileting: Minimal Assistance - Patient > 75%     Transfers Chair/bed transfer  Transfers assist     Chair/bed transfer assist level: Contact Guard/Touching assist Chair/bed transfer assistive device: Sliding board   Locomotion Ambulation   Ambulation assist   Ambulation activity did not occur: Safety/medical concerns (New B BKA, B LEs NWB)          Walk 10 feet activity   Assist  Walk 10 feet activity did not occur: Safety/medical concerns        Walk 50 feet activity   Assist Walk 50 feet with 2 turns activity did not occur: Safety/medical concerns         Walk 150 feet activity   Assist Walk 150 feet activity did not occur: Safety/medical concerns  Walk 10 feet on uneven surface  activity   Assist Walk 10 feet on uneven surfaces activity did not occur: Safety/medical concerns         Wheelchair     Assist Will patient use wheelchair at discharge?: Yes Type of Wheelchair: Manual Wheelchair activity did not occur: Safety/medical concerns (increased pain and anxiety during  evalutaiton)  Wheelchair assist level: Supervision/Verbal cueing Max wheelchair distance: 172ft    Wheelchair 50 feet with 2 turns activity    Assist    Wheelchair 50 feet with 2 turns activity did not occur: Safety/medical concerns   Assist Level: Supervision/Verbal cueing   Wheelchair 150 feet activity     Assist  Wheelchair 150 feet activity did not occur: Safety/medical concerns   Assist Level: Supervision/Verbal cueing   Blood pressure (!) 145/65, pulse (!) 103, temperature 98.3 F (36.8 C), temperature source Oral, resp. rate 17, height 4\' 3"  (1.295 m), weight 70.8 kg, SpO2 98 %.  Medical Problem List and Plan: 1.  Decreased functional mobility secondary to progressive bilateral lower extremity gangrenous changes with recent debridement and skin grafts.  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  Wound VAC as directed             -patient may not shower until wound VACs are off after 7 days, DC 10/13             -ELOS/Goals: 10/25 or potentially this weekend  -Continue CIR 2.  Antithrombotics: -DVT/anticoagulation: Eliquis             -antiplatelet therapy: Plavix 3. Pain Management: cymbalta 60mg  daily for phantom pain.  -  oxycodone as needed   10/18-19: pain is adequatelycontrolled with Tylenol and Oxycodone.   4. Mood: Provide emotional support             -antipsychotic agents: N/A  -pt struggling with coping d/t amputations,pain. Appreciate input from Dr. Kieth Brightly.   -cymbalta already on board for pain  -don't know if [employer] has an amputee support volunteer-might be helpful 5. Neuropsych: This patient is capable of making decisions on her own behalf. 6. Skin/Wound Care: MASD to sacrum/buttocks--keep dry, barrier cream  Wound vac removed on 10/13 without complications 7. Fluids/Electrolytes/Nutrition: Routine in and outs with follow-up chemistries 8.  Acute blood loss anemia.  Hgb up to 9.9 on 10/11 9.  Rheumatoid arthritis.  Chronic prednisone 10.  CAD  status post cardiac catheterization August 2021 underwent rotational atherectomy assisted multivessel PCI.  Continue Plavix and Eliquis x12 months 11.  CKD stage IIIb.  Baseline creatinine 1.37 12.  Diastolic congestive heart failure.  Monitor for any signs of fluid overload.  Continue Bumex 2 mg daily  -weights down since 10/10. Follow for trend Filed Weights   12/23/19 1300 01/01/20 0446 01/01/20 0500  Weight: 83.9 kg 70.8 kg 70.8 kg     13.  Hypertension.  Lopressor 12.5 mg twice daily.  Monitor with increased mobility Vitals:   12/31/19 2017 01/01/20 0456  BP: (!) 147/64 (!) 145/65  Pulse: (!) 101 (!) 103  Resp: 16 17  Temp: 98.7 F (37.1 C) 98.3 F (36.8 C)  SpO2: 100% 98%  10/15: BP and HR have been labile- continue to monitor TID. Increase lopressor  To 25mg  BID 10/19 improved bp control 14.  Diabetes mellitus TYPE I, not II with peripheral neuropathy.  Hemoglobin A1c 6.3.  Patient with insulin pump prior to admission.  Follow-up diabetic coordinator- will con't insulin pump, since pt has managed for >60 years.  CBG (last 3)  Recent Labs    01/01/20 0407 01/01/20 0429 01/01/20 0441  GLUCAP 41* 69* 100*  10/12: uncontrolled this morning to 236. 42 on 10/11 evening. Discussed her insulin pump. She will take 2U less than before.   10/13: she discussed with her endocroniologist and they have decreased her basal rate by 20% with good results.  10/19 hypoglycemic this morning.  15. Constipation- 1st BM since admission was today- added Senokot 1 tab daily- also on Dulcolax pills  -had bm 10/18 16. Urinary and bowel urgency- is chronic for pt- has bedpan right next to her on bed.  17. Itching in residual limbs: benadryl cream ordered and is helping just proximal to incisions    LOS: 9 days A FACE TO FACE EVALUATION WAS PERFORMED  Ranelle Oyster 01/01/2020, 10:19 AM

## 2020-01-01 NOTE — Progress Notes (Signed)
  Hypoglycemic Event  CBG: 44  Treatment: 0415 8 oz apple juice and snack   Symptoms: hand tremors, tachycardia . Patient reports feeling fine     Follow-up CBG: Time: 0425 CBG Result:69  Possible Reasons for Event: Poor oral intake on night shift, lack of bedtime snack   Comments/MD notified:  Hypoglycemic event protocol followed and  order placed    Shaune Leeks

## 2020-01-01 NOTE — Progress Notes (Signed)
Recreational Therapy Session Note  Patient Details  Name: Kaitlin Hudson MRN: [id number] Date of Birth: 16-May-1955 Today's Date: 01/01/2020  Pain: no c/o Skilled Therapeutic Interventions/Progress Updates: Session focused on discharge planning/education including positioning, energy conservation strategies, directing her care (set up assistance for greater independence with desired tasks.)  Reviewed use of patient education notebook to store amputee educational materials, home exercise program, home safety information.  Pt stated again today that she was having difficulty keeping track of the wealth of information she was receiving.  Emotional support provided.  Kaitlin Hudson 01/01/2020, 2:36 PM

## 2020-01-01 NOTE — Progress Notes (Signed)
Physical Therapy Session Note  Patient Details  Name: Kaitlin Hudson MRN: [id number] Date of Birth: 11-04-1955  Today's Date: 01/01/2020 PT Individual Time: 0920-0959 PT Individual Time Calculation (min): 39 min   Short Term Goals: Week 2:  PT Short Term Goal 1 (Week 2): STG=LTG due to ELOS.  Skilled Therapeutic Interventions/Progress Updates:     Patient in w/c with LEs elevated on the bed upon PT arrival. Patient alert and agreeable to PT session. Patient reported 5/10 B residual limb pain during session, RN made aware. PT provided repositioning, rest breaks, and distraction as pain interventions throughout session.   Therapeutic Activity: Transfers: Patient performed a simulated sedan height car transfer with CGA using a slide board with min A for board placement. Provided cues for w/c set up, hand placement, board placement and head hips relationship.  Wheelchair Mobility:  Patient propelled wheelchair 130 feet with supervision-mod I. Provided min verbal cues for donning doffing leg rests x2.  Misty Stanley, RT, arrived during session and with PT, reviewed energy conservation techniques and ways to plan out her day for going out or showering to manage time and fatigue.  Patient in w/c handed off to RT at end of session.   Therapy Documentation Precautions:  Precautions Precautions: Fall, Other (comment) Precaution Comments: x2 wound vacs Restrictions Weight Bearing Restrictions: Yes RLE Weight Bearing: Non weight bearing LLE Weight Bearing: Non weight bearing   Therapy/Group: Individual Therapy  Rafaella Kole L Neetu Carrozza PT, DPT  01/01/2020, 4:22 PM

## 2020-01-01 NOTE — Progress Notes (Signed)
Patient ID: Kaitlin Hudson, female   DOB: [birth year], 64 y.o.   MRN: [id number]  SW met with pt and pt husband in room to confirm d/c remains 10/25. Pt would like to discharge earlier if possible.  Pt husband states he was able to TTB. SW informed there will be updates if any changes with d/c date.    Loralee Pacas, MSW, [place] Office: [phone] Cell: [phone] Fax: [phone]

## 2020-01-02 ENCOUNTER — Inpatient Hospital Stay (HOSPITAL_COMMUNITY): Payer: Medicare Other

## 2020-01-02 LAB — GLUCOSE, CAPILLARY
Glucose-Capillary: 160 mg/dL — ABNORMAL HIGH (ref 70–99)
Glucose-Capillary: 143 mg/dL — ABNORMAL HIGH (ref 70–99)
Glucose-Capillary: 76 mg/dL (ref 70–99)
Glucose-Capillary: 305 mg/dL — ABNORMAL HIGH (ref 70–99)
Glucose-Capillary: 53 mg/dL — ABNORMAL LOW (ref 70–99)
Glucose-Capillary: 70 mg/dL (ref 70–99)

## 2020-01-02 NOTE — Progress Notes (Signed)
Patient ID: Kaitlin Hudson, female   DOB: 1956-02-04, 64 y.o.   MRN: [id number]  SW met with pt and pt husband in room to inform on change in d/c date from 10/25 to 10/23. SW updated [facility] on d/c date change.   Loralee Pacas, MSW, [place] Office: [phone] Cell: [phone] Fax: [phone]

## 2020-01-02 NOTE — Progress Notes (Signed)
Physical Therapy Session Note  Patient Details  Name: Kaitlin Hudson MRN: [id number] Date of Birth: Jan 19, 1956  Today's Date: 01/02/2020 PT Individual Time: 1300-1415 PT Individual Time Calculation (min): 75 min   Short Term Goals: Week 2:  PT Short Term Goal 1 (Week 2): STG=LTG due to ELOS.  Skilled Therapeutic Interventions/Progress Updates:     Patient in recliner with her husband in the room upon PT arrival. Patient alert and agreeable to PT session. Patient reported 4/10 B residual limb pain during session, RN made aware. PT provided repositioning, rest breaks, and distraction as pain interventions throughout session.   Therapeutic Activity: Transfers: Patient performed a slide board transfer recliner to w/c with close supervision and supervision for board placement. Patient performed a sedan car transfer in her personal vehicle x2 with CGA-close supervision using a slide board. Provided cues for safe technique throughout. Patient's husband performed safe guarding during second transfer. She then performed an unlevel slide board transfer w/c<>elevated bed, >3" heigh difference, to simulate home bed transfer to the guest bed with CGA and cues for head hips relationship for improved hip clearance when going up hill.   Wheelchair Mobility:  Patient was transported in the w/c with total A throughout session for energy conservation and time management. Patient reported L upper trap soreness from w/c mobility and UE exercises earlier today. Provided education on overuse injuries and strategies for prevention and pain management.   Patient in w/c with her husband in the room at end of session with breaks locked and all needs within reach. Required increased time for transport to [employer] for car transfer and demonstration of transfers and education throughout session. Discussed d/c planning, based on patient's performance with unlevel transfer to/from the bed, PT recommending moving d/c up to  Saturday. Patient and her husband in agreement. Team made aware.    Therapy Documentation Precautions:  Precautions Precautions: Fall, Other (comment) Precaution Comments: x2 wound vacs Restrictions Weight Bearing Restrictions: Yes RLE Weight Bearing: Non weight bearing LLE Weight Bearing: Non weight bearing   Therapy/Group: Individual Therapy  Kalid Ghan L Drexler Maland PT, DPT  01/02/2020, 4:22 PM

## 2020-01-02 NOTE — Progress Notes (Signed)
Physical Therapy Session Note  Patient Details  Name: Kaitlin Hudson MRN: [id number] Date of Birth: 1955/07/22  Today's Date: 01/02/2020 PT Individual Time: 0945-1100 PT Individual Time Calculation (min): 75 min   Short Term Goals: Week 1:  PT Short Term Goal 1 (Week 1): Patient will perform bed mobility with supervision. PT Short Term Goal 1 - Progress (Week 1): Met PT Short Term Goal 2 (Week 1): Patient will perform pressure relief independently when seated in w/c. PT Short Term Goal 2 - Progress (Week 1): Progressing toward goal PT Short Term Goal 3 (Week 1): Patient will perform basic transfers with min A. PT Short Term Goal 3 - Progress (Week 1): Met PT Short Term Goal 4 (Week 1): Patient will propel w/c >100 feet with supervision. PT Short Term Goal 4 - Progress (Week 1): Met Week 2:  PT Short Term Goal 1 (Week 2): STG=LTG due to ELOS. Week 3:     Skilled Therapeutic Interventions/Progress Updates:    PAIN 3/10 distal LEs, treatment to tolerance, did not limit session.  Pt seen this am for session focusing on global strengthening, uneven transfers, wc skills.  Pt propels wc mod I to gym. Pt performs uneven transfer to mat w/uphill bias, Independent w/set up, cga w/transfer due to uneven surface.  Seated therex: Canoe rows 1 min x 2 for core and ue strengthening laqS X 25 EACH  Sit to supine independently SLR x 15 each Bridging 2x 20 w/3 sec hold TKEs x 25  Supine to sidelying independently, rolls L/R independendly Hip abd x 20 Hip extension x 20  Supine to sit independendly Seated core/cardiovascular intervals ball toss w/rebounder 3 x 1 min  Mat to wc uneven transfer w/downhill bias w/supervision. wc propulsion x 162f including narrow doorway mod I  UBE L1.5 x 2 min for UE endurance/cardiovascular, pt c/o L shoulder discomfort, discontinued activity.    Pt provided w/full length dysem for stabilization of wc cushion at home.  Placed in her wc.  Also added  applewood board to decrease sagging and extend lifetime of cushion, iprove positioning in wc.  Discussed w/pt possibility of huband constructing one for home use for her personal wc.   wc propulsion x 1585fmod I wc to recliner SBT w/verbal cues/forgets to flip back armrest before placing board, proper alignment of wc. Pt left oob in recliner w/ needs in reach.   Therapy Documentation Precautions:  Precautions Precautions: Fall, Other (comment) Precaution Comments: x2 wound vacs Restrictions Weight Bearing Restrictions: Yes RLE Weight Bearing: Non weight bearing LLE Weight Bearing: Non weight bearing    Therapy/Group: Individual Therapy  BaCallie FieldingPTAmboy0/20/2021, 12:51 PM

## 2020-01-02 NOTE — Progress Notes (Signed)
Occupational Therapy Session Note  Patient Details  Name: Kaitlin Hudson MRN: [id number] Date of Birth: 09-Feb-1956  Today's Date: 01/02/2020 OT Individual Time: 9833-8250 OT Individual Time Calculation (min): 45 min    Short Term Goals: Week 1:  OT Short Term Goal 1 (Week 1): Pt will complete BSC transfer with min A OT Short Term Goal 1 - Progress (Week 1): Met OT Short Term Goal 2 (Week 1): Pt will complete toileting tasks with min A OT Short Term Goal 2 - Progress (Week 1): Met OT Short Term Goal 3 (Week 1): Pt will don LB clothing with CGA OT Short Term Goal 3 - Progress (Week 1): Met OT Short Term Goal 4 (Week 1): Pt will correctly position BLE in bed with no more than min cueing OT Short Term Goal 4 - Progress (Week 1): Progressing toward goal  Skilled Therapeutic Interventions/Progress Updates:    1:1. Pt received in bed agreeable to OT. Pt is set up for all BADL tasks using AP transfers to [facility], bathing on BSC and dressing UB on BSC/LB on EOB with lateral leans. Pt LLE wrapping fell off during AP transfer and OT completed edu on figure 4 wrapping, sking inspection (mirror provided) and donning shrinker (A from OT with pt assisting). Pt tearful in prep looking at residual limb but pt able to identify areas that need shaping and areas likely draining into bandage. CGA SB transfer to wc from EOB. Exited sesion with pt seated in w/c, exit alarm on and call light itn reach.   Therapy Documentation Precautions:  Precautions Precautions: Fall, Other (comment) Precaution Comments: x2 wound vacs Restrictions Weight Bearing Restrictions: Yes RLE Weight Bearing: Non weight bearing LLE Weight Bearing: Non weight bearing General:   Vital Signs: Therapy Vitals Temp: 98.1 F (36.7 C) Pulse Rate: 95 Resp: 16 BP: (!) 156/66 Patient Position (if appropriate): Lying Oxygen Therapy SpO2: 100 % O2 Device: Room Air Pain:   ADL: ADL Eating: Set up Where Assessed-Eating: Bed  level Grooming: Supervision/safety Where Assessed-Grooming: Bed level Upper Body Bathing: Supervision/safety Where Assessed-Upper Body Bathing: Bed level Lower Body Bathing: Minimal assistance Where Assessed-Lower Body Bathing: Bed level Upper Body Dressing: Supervision/safety Where Assessed-Upper Body Dressing: Bed level Lower Body Dressing: Moderate assistance Where Assessed-Lower Body Dressing: Bed level Toileting: Moderate assistance Where Assessed-Toileting: Wheelchair (using urinal) Toilet Transfer: Unable to assess Tub/Shower Transfer: Unable to assess Tub/Shower Transfer Method: Unable to assess [employer] Transfer: Unable to assess [employer] Transfer Method: Unable to assess Vision   Perception    Praxis   Exercises:   Other Treatments:     Therapy/Group: Individual Therapy  Tonny Branch 01/02/2020, 6:54 AM

## 2020-01-02 NOTE — Progress Notes (Signed)
[facility] PHYSICAL MEDICINE & REHABILITATION PROGRESS NOTE   Subjective/Complaints: Doing ok. Pain levels reasonable. Able to sleep last night. Anxious to get home  ROS: Patient denies fever, rash, sore throat, blurred vision, nausea, vomiting, diarrhea, cough, shortness of breath or chest pain, headache, or mood change.   Objective:   No results found. No results for input(s): WBC, HGB, HCT, PLT in the last 72 hours. No results for input(s): NA, K, CL, CO2, GLUCOSE, BUN, CREATININE, CALCIUM in the last 72 hours.  Intake/Output Summary (Last 24 hours) at 01/02/2020 0854 Last data filed at 01/02/2020 0700 Gross per 24 hour  Intake 410 ml  Output --  Net 410 ml    Physical Exam: Vital Signs Blood pressure (!) 156/66, pulse 95, temperature 98.1 F (36.7 C), resp. rate 16, height 4\' 3"  (1.295 m), weight 70.2 kg, SpO2 100 %.  Constitutional: No distress . Vital signs reviewed. HEENT: EOMI, oral membranes moist Neck: supple Cardiovascular: RRR without murmur. No JVD    Respiratory/Chest: CTA Bilaterally without wheezes or rales. Normal effort    GI/Abdomen: BS +, non-tender, non-distended Ext: no clubbing, cyanosis, or edema Psych: pleasant but flat Skin: sl serous drainage along later aspect of incisions MASD along sacrum/buttocks Neurologic: Cranial nerves II through XII intact, motor strength is 5/5 in bilateral deltoid, bicep, tricep, grip, 4- hip flexor. Sensory exam intact Musculoskeletal: B BKA- shrinkers fitting appropriately, both legs still tender   Assessment/Plan: 1. Functional deficits secondary to B BKA which require 3+ hours per day of interdisciplinary therapy in a comprehensive inpatient rehab setting.  Physiatrist is providing close team supervision and 24 hour management of active medical problems listed below.  Physiatrist and rehab team continue to assess barriers to discharge/monitor patient progress toward functional and medical goals  Care  Tool:  Bathing    Body parts bathed by patient: Right arm, Left arm, Chest, Abdomen, Front perineal area, Buttocks, Right upper leg, Left upper leg, Face     Body parts n/a: Right lower leg, Left lower leg (B BKA)   Bathing assist Assist Level: Contact Guard/Touching assist     Upper Body Dressing/Undressing Upper body dressing   What is the patient wearing?: Pull over shirt, Bra    Upper body assist Assist Level: Supervision/Verbal cueing    Lower Body Dressing/Undressing Lower body dressing      What is the patient wearing?: Underwear/pull up, Pants     Lower body assist Assist for lower body dressing: Minimal Assistance - Patient > 75%     Toileting Toileting Toileting Activity did not occur (Clothing management and hygiene only): N/A (no void or bm)  Toileting assist Assist for toileting: Contact Guard/Touching assist     Transfers Chair/bed transfer  Transfers assist     Chair/bed transfer assist level: Contact Guard/Touching assist Chair/bed transfer assistive device: Sliding board   Locomotion Ambulation   Ambulation assist   Ambulation activity did not occur: Safety/medical concerns (New B BKA, B LEs NWB)          Walk 10 feet activity   Assist  Walk 10 feet activity did not occur: Safety/medical concerns        Walk 50 feet activity   Assist Walk 50 feet with 2 turns activity did not occur: Safety/medical concerns         Walk 150 feet activity   Assist Walk 150 feet activity did not occur: Safety/medical concerns         Walk 10 feet on uneven  surface  activity   Assist Walk 10 feet on uneven surfaces activity did not occur: Safety/medical concerns         Wheelchair     Assist Will patient use wheelchair at discharge?: Yes Type of Wheelchair: Manual Wheelchair activity did not occur: Safety/medical concerns (increased pain and anxiety during evalutaiton)  Wheelchair assist level: Supervision/Verbal  cueing Max wheelchair distance: 130 ft    Wheelchair 50 feet with 2 turns activity    Assist    Wheelchair 50 feet with 2 turns activity did not occur: Safety/medical concerns   Assist Level: Supervision/Verbal cueing   Wheelchair 150 feet activity     Assist  Wheelchair 150 feet activity did not occur: Safety/medical concerns   Assist Level: Supervision/Verbal cueing   Blood pressure (!) 156/66, pulse 95, temperature 98.1 F (36.7 C), resp. rate 16, height 4\' 3"  (1.295 m), weight 70.2 kg, SpO2 100 %.  Medical Problem List and Plan: 1.  Decreased functional mobility secondary to progressive bilateral lower extremity gangrenous changes with recent debridement and skin grafts.  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  Wound VAC as directed             -patient may  shower with legs covered             -ELOS/Goals: 10/25 or potentially this weekend  -Continue CIR 2.  Antithrombotics: -DVT/anticoagulation: Eliquis             -antiplatelet therapy: Plavix 3. Pain Management: cymbalta 60mg  daily for phantom pain.  -  oxycodone as needed   10/18-19: pain is adequatelycontrolled with Tylenol and Oxycodone.   4. Mood: Provide emotional support             -antipsychotic agents: N/A  -pt struggling with coping d/t amputations,pain. Appreciate input from Dr. Kieth Brightly.   -cymbalta already on board for pain  -team continues to provide ego support, encouraged her to focus on what she needs to do to get home as well 5. Neuropsych: This patient is capable of making decisions on her own behalf. 6. Skin/Wound Care: MASD to sacrum/buttocks--keep dry, barrier cream  Wound vac removed on 10/13 without complications 7. Fluids/Electrolytes/Nutrition: Routine in and outs with follow-up chemistries 8.  Acute blood loss anemia.  Hgb up to 9.9 on 10/11 9.  Rheumatoid arthritis.  Chronic prednisone 10.  CAD status post cardiac catheterization August 2021 underwent rotational atherectomy  assisted multivessel PCI.  Continue Plavix and Eliquis x12 months 11.  CKD stage IIIb.  Baseline creatinine 1.37 12.  Diastolic congestive heart failure.  Monitor for any signs of fluid overload.  Continue Bumex 2 mg daily  -weights down since 10/10. Appear stable 10/20 Filed Weights   01/01/20 0446 01/01/20 0500 01/02/20 0500  Weight: 70.8 kg 70.8 kg 70.2 kg     13.  Hypertension.  Lopressor 12.5 mg twice daily.  Monitor with increased mobility Vitals:   01/01/20 1935 01/02/20 0505  BP: (!) 138/58 (!) 156/66  Pulse: 91 95  Resp: 16 16  Temp: 98.2 F (36.8 C) 98.1 F (36.7 C)  SpO2: 98% 100%  10/15: BP and HR have been labile- continue to monitor TID. Increase lopressor  To 25mg  BID 10/20 improved bp control 14.  Diabetes mellitus TYPE I, not II with peripheral neuropathy.  Hemoglobin A1c 6.3.  Patient with insulin pump prior to admission.  Follow-up diabetic coordinator- will con't insulin pump, since pt has managed for >60 years.  CBG (last 3)  Recent Labs  01/01/20 1645 01/02/20 0246 01/02/20 0552  GLUCAP 115* 160* 143*  10/12: uncontrolled this morning to 236. 42 on 10/11 evening. Discussed her insulin pump. She will take 2U less than before.   10/13: she discussed with her endocroniologist and they have decreased her basal rate by 20% with good results.  10/20 pt making appropriate adjustments to pump.  15. Constipation- 1st BM since admission was today- added Senokot 1 tab daily- also on Dulcolax pills  -had bm 10/19 16. Urinary and bowel urgency- is chronic for pt- has bedpan right next to her on bed.  17. Itching in residual limbs: benadryl cream ordered and is helping just proximal to incisions    LOS: 10 days A FACE TO FACE EVALUATION WAS PERFORMED  Ranelle Oyster 01/02/2020, 8:54 AM

## 2020-01-03 ENCOUNTER — Inpatient Hospital Stay (HOSPITAL_COMMUNITY): Payer: Medicare Other

## 2020-01-03 LAB — GLUCOSE, CAPILLARY
Glucose-Capillary: 198 mg/dL — ABNORMAL HIGH (ref 70–99)
Glucose-Capillary: 213 mg/dL — ABNORMAL HIGH (ref 70–99)
Glucose-Capillary: 226 mg/dL — ABNORMAL HIGH (ref 70–99)
Glucose-Capillary: 136 mg/dL — ABNORMAL HIGH (ref 70–99)
Glucose-Capillary: 245 mg/dL — ABNORMAL HIGH (ref 70–99)

## 2020-01-03 NOTE — Progress Notes (Signed)
Physical Therapy Discharge Summary  Patient Details  Name: Kaitlin Hudson MRN: [id number] Date of Birth: 03-Dec-1955  Patient has met 6 of 6 long term goals due to improved activity tolerance, improved balance, improved postural control, increased strength, increased range of motion, decreased pain and ability to compensate for deficits.  Patient to discharge at a wheelchair level Modified Independent for w/c mobility and supervision for transfers (CGA for car transfers). Patient's care partner has attended hands on training and is independent to provide the necessary physical assistance at discharge.  Reasons goals not met: n/a  Recommendation:  Patient will benefit from ongoing skilled PT services in home health setting to continue to advance safe functional mobility, address ongoing impairments in strength/ROM, activity tolerance, functional mobility, community w/c mobility, amputee education, patient/caregiver education, and minimize fall risk.  Equipment: 30" slide board - pt has all other necessary DME  Reasons for discharge: treatment goals met  Patient/family agrees with progress made and goals achieved: Yes  PT Discharge Precautions/Restrictions Precautions Precautions: Fall Restrictions Weight Bearing Restrictions: Yes RLE Weight Bearing: Non weight bearing LLE Weight Bearing: Non weight bearing Vision/Perception  Perception Perception: Within Functional Limits Praxis Praxis: Intact  Cognition Overall Cognitive Status: Within Functional Limits for tasks assessed Arousal/Alertness: Awake/alert Orientation Level: Oriented X4 Attention: Focused Focused Attention: Appears intact Selective Attention: Appears intact Memory: Appears intact Awareness: Appears intact Problem Solving: Appears intact Safety/Judgment: Appears intact Sensation Sensation Light Touch: Appears Intact (Pt reports intact sensation at residual limbs) Hot/Cold: Appears Intact Coordination Gross  Motor Movements are Fluid and Coordinated: No Fine Motor Movements are Fluid and Coordinated: Yes Coordination and Movement Description: B BKA's Motor  Motor Motor: Other (comment);Abnormal postural alignment and control Motor - Discharge Observations: Continued generalized weakness, decreased balance due to new B BKAs with notable improvement since admission  Mobility Bed Mobility Bed Mobility: Rolling Right;Rolling Left;Supine to Sit;Sit to Supine Rolling Right: Independent Rolling Left: Independent Supine to Sit: Independent Sit to Supine: Independent Transfers Transfers: Engineer, maintenance Transfer: Contact Guard/Touching assist Lateral/Scoot Transfers: Supervision/Verbal cueing Transfer (Assistive device): Other (Comment) (slide board) Locomotion  Gait Ambulation: No Gait Gait: No Stairs / Additional Locomotion Stairs: No Ramp: Contact Guard/touching assist (in manual w/c) Wheelchair Mobility Wheelchair Mobility: Yes Wheelchair Assistance: Independent with Camera operator: Both upper extremities Wheelchair Parts Management: Independent Distance: 150 ft  Trunk/Postural Assessment  Cervical Assessment Cervical Assessment: Within Functional Limits Thoracic Assessment Thoracic Assessment: Within Functional Limits Lumbar Assessment Lumbar Assessment: Exceptions to [facility] (posterior pelvic tilt) Postural Control Postural Control: Deficits on evaluation (some limitations in righting reactions)  Balance Static Sitting Balance Static Sitting - Balance Support: No upper extremity supported Static Sitting - Level of Assistance: 6: Modified independent (Device/Increase time) Dynamic Sitting Balance Dynamic Sitting - Balance Support: During functional activity;No upper extremity supported Dynamic Sitting - Level of Assistance: 6: Modified independent (Device/Increase time) Extremity Assessment      RLE  Assessment RLE Assessment: Exceptions to [facility] Active Range of Motion (AROM) Comments: Now has full knee flexion and extension ROM due to decreased edema and pain General Strength Comments: Grossly 3+/5 to 4/5 in hip and knee LLE Assessment Active Range of Motion (AROM) Comments: Now has full knee flexion and extension ROM due to decreased edema and pain General Strength Comments: Grossly 3+/5 to 4/5 in hip and knee    Doreene Burke, PT, DPT Page Spiro , PT, DPT, CSRS  01/04/2020, 5:45 PM

## 2020-01-03 NOTE — Progress Notes (Signed)
Recreational Therapy Session Note  Patient Details  Name: Kaitlin Hudson MRN: [id number] Date of Birth: Jul 13, 1955 Today's Date: 01/03/2020  Pain: no c/o Skilled Therapeutic Interventions/Progress Updates: Session focused on community reintegration education and discharge planning.  Discussion included community mobility, energy conservation, public restroom access, and safety awareness.  Pt states feeling prepared for community mobility but acknowledges that she only plans to be out and about for doctors appointments initially.  Pt questioning if she would be safe to be at home alone for brief periods of time while husband ran errands.  Discussed having things set up and within reach while he was away (cell phone, snacks, drinks, glucometer, etc)  Advised no transfers without supervision at this time.  Pt stated understanding.  While in w/c or provided with set up assistance, pt completes simple tasks with Mod I.    Carles Florea 01/03/2020, 9:10 AM

## 2020-01-03 NOTE — Progress Notes (Signed)
Physical Therapy Session Note  Patient Details  Name: Kaitlin Hudson MRN: [id number] Date of Birth: Dec 16, 1955  Today's Date: 01/03/2020 PT Individual Time: 0900-1000 PT Individual Time Calculation (min): 60 min   Short Term Goals: Week 2:  PT Short Term Goal 1 (Week 2): STG=LTG due to ELOS.  Skilled Therapeutic Interventions/Progress Updates:     Patient in recliner in the room upon PT arrival. Patient alert and agreeable to PT session. Patient reported 4/10 B residual limb pain during session, RN made aware. PT provided repositioning, rest breaks, and distraction as pain interventions throughout session.   Therapeutic Activity: Bed Mobility: Patient performed supine to/from sit with mod I in a flat bed with use of bed rail to simulate home set-up.  Transfers: Patient performed slide board transfers recliner<>w/c and unlevel transfer w/c<>bed with close supervision and PT blocking w/c for safety and independently placed board. Provided cues for hand placement, board placement, and head-hips relationship for proper technique and decreased assist with transfers.   Wheelchair Mobility:  Patient propelled wheelchair ~250 feet with mod I. Placed B pillows on limb pads for improved comfort and tolerance due to short limb length. Patient performed all parts management with set-up A-mod I.  Community Integration: She also propelled the w/c in the gift shop, navigating narrow spaces and problem solving reach for object with use of assistance or a reacher from w/c level. Demonstrated safe technique and taking appropriate rest breaks. Discussed eating out and located a handicap accessible table to demonstrate use and encouraged the patient to ask for this table in advance if able. Also discussed navigating in/out of the bathroom with use of a brief and a female urinal for voiding in public due to unsafe toilet transfers at this time, patient in agreement.   Therapeutic Exercise: Patient performed 1 set  of the following exercises provided as HEP handout with verbal and tactile cues for proper technique. Access Code: [email or web address] Supine Quad Set (BKA) - 1-2 x daily - 7 x weekly - 2 sets - 10 reps - 5 sec hold  Supine Gluteal Sets - 1-2 x daily - 7 x weekly - 2 sets - 10 reps - 5 sec hold  Supine Active Straight Leg Raise - 1-2 x daily - 7 x weekly - 2 sets - 10 reps  Supine Isometric Hip Adduction with Towel Roll (AKA) - 1-2 x daily - 7 x weekly - 2 sets - 10 reps  Sidelying Hip Abduction (AKA) - 1-2 x daily - 7 x weekly - 2 sets - 10 reps  Seated Knee Extension (BKA) - 1-2 x daily - 7 x weekly - 2 sets - 10 reps  Sidelying Hip Abduction with Flexion and Extension (AKA) - 2-3 x daily - 7 x weekly - 2 sets - 10 reps Patient Education  Pressure Relief in a Manual Wheelchair, performing side leans  Patient in recliner with Kaitlin Stanley, RN in the room at end of session with breaks locked and all needs within reach.    Therapy Documentation Precautions:  Precautions Precautions: Fall Precaution Comments: x2 wound vacs Restrictions Weight Bearing Restrictions: Yes RLE Weight Bearing: Non weight bearing LLE Weight Bearing: Non weight bearing   Therapy/Group: Individual Therapy  Charle Mclaurin L Shamya Macfadden PT, DPT  01/03/2020, 10:47 AM

## 2020-01-03 NOTE — Progress Notes (Signed)
[facility] PHYSICAL MEDICINE & REHABILITATION PROGRESS NOTE   Subjective/Complaints: In good spirits today!. Excited to be going home Saturday. No new complaints  ROS: Patient denies fever, rash, sore throat, blurred vision, nausea, vomiting, diarrhea, cough, shortness of breath or chest pain,  headache, or mood change.   .   Objective:   No results found. No results for input(s): WBC, HGB, HCT, PLT in the last 72 hours. No results for input(s): NA, K, CL, CO2, GLUCOSE, BUN, CREATININE, CALCIUM in the last 72 hours.  Intake/Output Summary (Last 24 hours) at 01/03/2020 1103 Last data filed at 01/03/2020 0813 Gross per 24 hour  Intake 280 ml  Output --  Net 280 ml    Physical Exam: Vital Signs Blood pressure (!) 145/70, pulse (!) 110, temperature 98.3 F (36.8 C), resp. rate 14, height 4\' 3"  (1.295 m), weight 70.2 kg, SpO2 98 %.  Constitutional: No distress . Vital signs reviewed. HEENT: EOMI, oral membranes moist Neck: supple Cardiovascular: RRR without murmur. No JVD    Respiratory/Chest: CTA Bilaterally without wheezes or rales. Normal effort    GI/Abdomen: BS +, non-tender, non-distended Ext: no clubbing, cyanosis, or edema Psych: pleasant and cooperative, smiling!! Skin: BK's just dressed.  MASD along sacrum/buttocks Neurologic: Cranial nerves II through XII intact, motor strength is 5/5 in bilateral deltoid, bicep, tricep, grip, 4- hip flexor. Sensory exam intact Musculoskeletal: B BKA- shrinkers fitting appropriately, both legs remain tender, decreased edema   Assessment/Plan: 1. Functional deficits secondary to B BKA which require 3+ hours per day of interdisciplinary therapy in a comprehensive inpatient rehab setting.  Physiatrist is providing close team supervision and 24 hour management of active medical problems listed below.  Physiatrist and rehab team continue to assess barriers to discharge/monitor patient progress toward functional and medical  goals  Care Tool:  Bathing    Body parts bathed by patient: Right arm, Left arm, Chest, Abdomen, Front perineal area, Buttocks, Right upper leg, Left upper leg, Face     Body parts n/a: Right lower leg, Left lower leg (B BKA)   Bathing assist Assist Level: Contact Guard/Touching assist     Upper Body Dressing/Undressing Upper body dressing   What is the patient wearing?: Pull over shirt, Bra    Upper body assist Assist Level: Supervision/Verbal cueing    Lower Body Dressing/Undressing Lower body dressing      What is the patient wearing?: Underwear/pull up, Pants     Lower body assist Assist for lower body dressing: Minimal Assistance - Patient > 75%     Toileting Toileting Toileting Activity did not occur (Clothing management and hygiene only): N/A (no void or bm)  Toileting assist Assist for toileting: Contact Guard/Touching assist     Transfers Chair/bed transfer  Transfers assist     Chair/bed transfer assist level: Contact Guard/Touching assist Chair/bed transfer assistive device: Sliding board   Locomotion Ambulation   Ambulation assist   Ambulation activity did not occur: Safety/medical concerns (New B BKA, B LEs NWB)          Walk 10 feet activity   Assist  Walk 10 feet activity did not occur: Safety/medical concerns        Walk 50 feet activity   Assist Walk 50 feet with 2 turns activity did not occur: Safety/medical concerns         Walk 150 feet activity   Assist Walk 150 feet activity did not occur: Safety/medical concerns         Walk 10  feet on uneven surface  activity   Assist Walk 10 feet on uneven surfaces activity did not occur: Safety/medical concerns         Wheelchair     Assist Will patient use wheelchair at discharge?: Yes Type of Wheelchair: Manual Wheelchair activity did not occur: Safety/medical concerns (increased pain and anxiety during evalutaiton)  Wheelchair assist level:  Independent Max wheelchair distance: 150    Wheelchair 50 feet with 2 turns activity    Assist    Wheelchair 50 feet with 2 turns activity did not occur: Safety/medical concerns   Assist Level: Independent   Wheelchair 150 feet activity     Assist  Wheelchair 150 feet activity did not occur: Safety/medical concerns   Assist Level: Independent   Blood pressure (!) 145/70, pulse (!) 110, temperature 98.3 F (36.8 C), resp. rate 14, height 4\' 3"  (1.295 m), weight 70.2 kg, SpO2 98 %.  Medical Problem List and Plan: 1.  Decreased functional mobility secondary to progressive bilateral lower extremity gangrenous changes with recent debridement and skin grafts.  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  Wound VAC as directed             -patient may  shower with legs covered             -ELOS/Goals: 10/25    -Continue CIR 2.  Antithrombotics: -DVT/anticoagulation: Eliquis             -antiplatelet therapy: Plavix 3. Pain Management: cymbalta 60mg  daily for phantom pain.  -  oxycodone as needed   10/18-19: pain is adequatelycontrolled with Tylenol and Oxycodone.   4. Mood: Provide emotional support             -antipsychotic agents: N/A  - Appreciate input from Dr. Kieth Brightly.   -cymbalta already on board for pain  -she has been buoyed by earlier dc date! 5. Neuropsych: This patient is capable of making decisions on her own behalf. 6. Skin/Wound Care: MASD to sacrum/buttocks--keep dry, barrier cream  Wound vac removed on 10/13 without complications  -continue telfa, dry dressing to incisions 7. Fluids/Electrolytes/Nutrition: Routine in and outs with follow-up chemistries 8.  Acute blood loss anemia.  Hgb up to 9.9 on 10/11 9.  Rheumatoid arthritis.  Chronic prednisone 10.  CAD status post cardiac catheterization August 2021 underwent rotational atherectomy assisted multivessel PCI.  Continue Plavix and Eliquis x12 months 11.  CKD stage IIIb.  Baseline creatinine 1.37 12.   Diastolic congestive heart failure.  Monitor for any signs of fluid overload.  Continue Bumex 2 mg daily  -weights down since 10/10. Appear stable 10/20 Filed Weights   01/01/20 0446 01/01/20 0500 01/02/20 0500  Weight: 70.8 kg 70.8 kg 70.2 kg     13.  Hypertension.  Lopressor 12.5 mg twice daily.  Monitor with increased mobility Vitals:   01/02/20 1921 01/03/20 0312  BP: 138/87 (!) 145/70  Pulse: (!) 108 (!) 110  Resp: 16 14  Temp: 98.3 F (36.8 C) 98.3 F (36.8 C)  SpO2: 98% 98%  10/15: BP and HR have been labile- continue to monitor TID. Increase lopressor  To 25mg  BID 10/21 improved bp control 14.  Diabetes mellitus TYPE I, not II with peripheral neuropathy.  Hemoglobin A1c 6.3.  Patient with insulin pump prior to admission.  Follow-up diabetic coordinator- will con't insulin pump, since pt has managed for >60 years.  CBG (last 3)  Recent Labs    01/02/20 2209 01/03/20 0310 01/03/20 0612  GLUCAP 70 198*  213*  10/12: uncontrolled this morning to 236. 42 on 10/11 evening. Discussed her insulin pump. She will take 2U less than before.   10/13: she discussed with her endocroniologist and they have decreased her basal rate by 20% with good results.  10/21 pt makes adjustments to pump.  15. Constipation- 1st BM since admission was today- added Senokot 1 tab daily- also on Dulcolax pills  -moving bowels 16. Urinary and bowel urgency- is chronic for pt- has bedpan right next to her on bed.  17. Itching in residual limbs: benadryl cream ordered and is helping just proximal to incisions--might be some kind of reaction to kerlix    LOS: 11 days A FACE TO FACE EVALUATION WAS PERFORMED  Ranelle Oyster 01/03/2020, 11:03 AM

## 2020-01-03 NOTE — Progress Notes (Signed)
Recreational Therapy Discharge Summary Patient Details  Name: Alfonso Shackett MRN: [id number] Date of Birth: [birth year] Today's Date: 01/03/2020  Long term goals set: 1  Long term goals met: 1  Comments on progress toward goals: Pt has made great progress during LOS and is ready for discharge home with husband at Mod I w/c level for simple TR tasks.  Education provided on community reintegration, energy conservation, activity analysis/modifications, self advocacy, relaxation.  Pt stated understanding of the above and is excited to be going home.  Goal met. Reasons for discharge: discharge from hospital  Patient/family agrees with progress made and goals achieved: Yes  Eian Vandervelden 01/03/2020, 9:11 AM

## 2020-01-03 NOTE — Plan of Care (Signed)
  Problem: Consults Goal: RH LIMB LOSS PATIENT EDUCATION Description: Description: See Patient Education module for eduction specifics. Outcome: Progressing Goal: Skin Care Protocol Initiated - if Braden Score 18 or less Description: If consults are not indicated, leave blank or document N/A Outcome: Progressing Goal: Diabetes Guidelines if Diabetic/Glucose > 140 Description: If diabetic or lab glucose is > 140 mg/dl - Initiate Diabetes/Hyperglycemia Guidelines & Document Interventions  Outcome: Progressing   Problem: RH BOWEL ELIMINATION Goal: RH STG MANAGE BOWEL WITH ASSISTANCE Description: STG Manage Bowel with Min Assistance. Outcome: Progressing   Problem: RH BLADDER ELIMINATION Goal: RH STG MANAGE BLADDER WITH ASSISTANCE Description: STG Manage Bladder With Min Assistance Outcome: Progressing   Problem: RH SKIN INTEGRITY Goal: RH STG SKIN FREE OF INFECTION/BREAKDOWN Description: Skin to remain free from infection and breakdown with min assist while on CIR. Outcome: Progressing Goal: RH STG ABLE TO PERFORM INCISION/WOUND CARE W/ASSISTANCE Description: STG Able To Perform Incision/Wound Care With min Assistance. Outcome: Progressing   Problem: RH SAFETY Goal: RH STG ADHERE TO SAFETY PRECAUTIONS W/ASSISTANCE/DEVICE Description: STG Adhere to Safety Precautions With min Assistance at wheelchair level. Outcome: Progressing   Problem: RH PAIN MANAGEMENT Goal: RH STG PAIN MANAGED AT OR BELOW PT'S PAIN GOAL Description: <3 on a 0-10 pain scale. Outcome: Progressing   Problem: RH KNOWLEDGE DEFICIT LIMB LOSS Goal: RH STG INCREASE KNOWLEDGE OF SELF CARE AFTER LIMB LOSS Description: Patient will be able to demonstrate knowledge of medication management, diabetes management, wound care management, weight bearing precautions, and follow up care with the MD with min assist from CIR staff at discharge. Outcome: Progressing   

## 2020-01-03 NOTE — Progress Notes (Signed)
Physical Therapy Session Note  Patient Details  Name: Kaitlin Hudson MRN: [id number] Date of Birth: 11-03-1955  Today's Date: 01/03/2020 PT Individual Time: 1355-1510 PT Individual Time Calculation (min): 75 min   Short Term Goals: Week 1:  PT Short Term Goal 1 (Week 1): Patient will perform bed mobility with supervision. PT Short Term Goal 1 - Progress (Week 1): Met PT Short Term Goal 2 (Week 1): Patient will perform pressure relief independently when seated in w/c. PT Short Term Goal 2 - Progress (Week 1): Progressing toward goal PT Short Term Goal 3 (Week 1): Patient will perform basic transfers with min A. PT Short Term Goal 3 - Progress (Week 1): Met PT Short Term Goal 4 (Week 1): Patient will propel w/c >100 feet with supervision. PT Short Term Goal 4 - Progress (Week 1): Met Week 2:  PT Short Term Goal 1 (Week 2): STG=LTG due to ELOS. Week 3:     Skilled Therapeutic Interventions/Progress Updates:    PAIN stated 3/10 distal residual limbs, treatment to tolerance.  Pt seen for 75 min session w/focus on family ed, reviewing HEP, seating and positioning. Husband present and agreeable to transfer triaining.  Discussed transfers/sequencing/tendency to occasionall forget to flip back armrest, securing wc w/transfer.  Pt and husband performed wc to/from bed SBT safely and without assistance or cueing required.  Husband cleared to assist pt w/this in room.  Pt stated LEs are not comfortable in wc due to lack of support under knee areas/gap between wc cushion and limb support of wc.  Demonstrated use of sliding boards under wc cushion extending out to end of limb support on each side then cushioned using towels or foam (using our wc cushion in rooml.  Husband measured and stated he could build supports for home use.  Left set up for pt to trial over next day to determine if this would be good option for her.    Husband/pt also w/questions regarding OPT/Prosthetist relationship/how they  work together for prosthetic training.  Discussed usual practice w/pt/husband.  Pt then transported to gym for review of HEP/continuation from am session.   Pt performed wc to mat SBT w/supervision.   Therapist went thru each exercise and reviewed written instructions as given on HEP w/pt.  Pt performed the following: Seated LAQs 2x10 S/L hip abd 2x15 S/L hip abd/flex/extension x10  Pt then transfers mat to wc w/supervision.  Returned to room via transport due to session timing out. Reviewed HEP w/husband and discussed online resource/video access and husband voiced understanding. Pt left oob in wc w/husband in room and  needs in reach            Therapy Documentation Precautions:  Precautions Precautions: Fall Precaution Comments: x2 wound vacs Restrictions Weight Bearing Restrictions: Yes RLE Weight Bearing: Non weight bearing LLE Weight Bearing: Non weight bearing    Therapy/Group: Individual Therapy  Callie Fielding, [place] 01/03/2020, 4:16 PM

## 2020-01-03 NOTE — Discharge Summary (Signed)
Physician Discharge Summary  Patient ID: Kaitlin Hudson MRN: [id number] DOB/AGE: 66/12/1955 66 y.o.  Admit date: 12/23/2019 Discharge date: 01/05/2020  Discharge Diagnoses:  Principal Problem:   S/P bilateral below knee amputation (HCC) DVT prophylaxis Acute blood loss anemia Rheumatoid arthritis CAD CKD stage III Diastolic congestive heart failure Hypertension Constipation Diabetes mellitus Hyperlipidemia  Discharged Condition: Stable  Significant Diagnostic Studies: No results found.  Labs:  Basic Metabolic Panel: No results for input(s): NA, K, CL, CO2, GLUCOSE, BUN, CREATININE, CALCIUM, MG, PHOS in the last 168 hours.  CBC: No results for input(s): WBC, NEUTROABS, HGB, HCT, MCV, PLT in the last 168 hours.  CBG: Recent Labs  Lab 01/03/20 0612 01/03/20 1129 01/03/20 1639 01/03/20 2054 01/04/20 0313  GLUCAP 213* 226* 136* 245* 225*   Family history.  Positive for hypertension and hyperlipidemia.  Denies any colon cancer esophageal cancer or rectal cancer  Brief HPI:   Kaitlin Hudson is a 64 y.o. right-handed female with history of rheumatoid arthritis on chronic prednisone, diastolic congestive heart failure, CAD followed at [facility] status post cardiac catheterization August 2021 underwent rotational atherectomy assisted multivessel PCI maintained on Plavix and Eliquis, CKD stage II-3 with creatinine 1.37, hypertension, hyperlipidemia, diabetes mellitus with insulin pump and peripheral neuropathy.  Per chart review lives with spouse 1 level home ramped entrance.  Modified independent with rolling walker prior to admission.  Presented 12/19/2019 with bilateral ischemic ulcers and lower extremity rest pain.  She is status post debridement and skin graft for right Achilles ulcer 4 months out from surgery.  Patient with progressive gangrenous necrotic changes follow-up with orthopedic services limbs not felt to be salvageable and underwent bilateral transtibial  amputation 12/19/2019 per Dr. Lajoyce Corners.  Wound vacs as directed.  Acute blood loss anemia 8.8.  Therapy evaluations completed and patient was admitted for a comprehensive rehab program   Hospital Course: Kaitlin Hudson was admitted to rehab 12/23/2019 for inpatient therapies to consist of PT, ST and OT at least three hours five days a week. Past admission physiatrist, therapy team and rehab RN have worked together to provide customized collaborative inpatient rehab.  Pertaining to patient's bilateral BKA 12/19/2019 wound VAC discontinued patient would follow-up orthopedic services.  She remained on Eliquis Plavix as prior to admission with history of CAD.  Pain managed with use of Cymbalta scheduled as well as oxycodone for breakthrough pain.  Acute blood loss anemia stable 9.9 no bleeding episodes.  History of rheumatoid arthritis maintained on chronic prednisone.  As noted she did have a history of CAD status post cardiac catheterization August 2021 underwent rotational atherectomy assisted multivessel PCI she would follow-up outpatient with cardiology services.  CKD stage II-3 latest creatinine 1.22.  Blood pressure controlled on Bumex as well as Lopressor.  Patient follow-up outpatient with primary MD.  Diabetes mellitus with insulin pump blood sugars monitored closely she remained on a diabetic diet.  Bouts of constipation resolved with laxative assistance.   Blood pressures were monitored on TID basis and stable  Diabetes has been monitored with ac/hs CBG checks and SSI was use prn for tighter BS control.    Rehab course: During patient's stay in rehab weekly team conferences were held to monitor patient's progress, set goals and discuss barriers to discharge. At admission, patient required supervision supine to sit Max assist general transfers.  Moderate assist lower body bathing mod assist upper body dressing max is lower body dressing  Physical exam.  Blood pressure 158/68 pulse 87 temperature 98  respirations  17 oxygen saturations 92% room air Constitutional.  No acute distress HEENT Head.  Normocephalic and atraumatic Eyes.  Pupils round and reactive to light no discharge without nystagmus Neck.  Supple nontender no JVD without thyromegaly Cardiac regular rate rhythm without any extra sounds or murmur heard Abdomen.  Soft nontender positive bowel sounds without rebound Respiratory effort normal no respiratory distress without wheeze Skin.  Bilateral BKA sites are dressed with wound VAC in place.  She did have a small stage I between buttocks wound on backside not sacrum or buttocks Neurologic.  Alert oriented x3 follows commands  [name]  has had improvement in activity tolerance, balance, postural control as well as ability to compensate for deficits. [name] has had improvement in functional use RUE/LUE  and RLE/LLE as well as improvement in awareness.  Patient perform slide board transfers recliner to wheelchair with close supervision and supervision for board placement.  Performed a sedan car transfer in her personal vehicle x2 with contact-guard assist close supervision.  Patient's husband perform safeguarding during transfer.  Completed lateral scoot transfers to a bariatric bedside commode and a slide board during ADLs.  Patient able to doff clothing with lateral lean with minimal guard.  Completed all bathing seated with lateral leans and close supervision.  Completed upper body dressing with set up assist minimal assist to don pants to pull up posteriorly.  Full family teaching completed plan discharge to home       Disposition: Discharge to home    Diet: Diabetic diet  Special Instructions: No driving smoking or alcohol  Stump care antibacterial soap/Dial and water daily  Medications at discharge 1.  Tylenol as needed 2.  Eliquis 5 mg p.o. twice daily 3.  Bumex 2 mg p.o. daily 4.  Vitamin D 1000 units p.o. daily 5.  Plavix 75 mg p.o. daily 6.  Colace 100 mg p.o. twice  daily 7.  Cymbalta 60 mg nightly 8.  Insulin pump continue as directed 9.  Lopressor 12.5 mg p.o. twice daily 10.  Oxycodone 5 to 10 mg every 4 hours as needed pain 11.  Prednisone 10 mg at breakfast 5 mg with supper  30-35 minutes were spent completing discharge summary and discharge planning  Discharge Instructions    Ambulatory referral to Physical Medicine Rehab   Complete by: As directed    Moderate complexity follow-up 1 to 2 weeks bilateral BKA       Follow-up Information    Raulkar, Drema Pry, MD Follow up.   Specialty: Physical Medicine and Rehabilitation Why: Office to call for appointment Contact information: [address] [place] [address] [phone]        Nadara Mustard, MD Follow up.   Specialty: Orthopedic Surgery Why: Call for appointment Contact information: [address] [place] [address] [phone]               Signed: Mcarthur Rossetti Hailynn Slovacek 01/04/2020, 5:18 AM

## 2020-01-03 NOTE — Progress Notes (Signed)
Occupational Therapy Session Note  Patient Details  Name: Kaitlin Hudson MRN: [id number] Date of Birth: 1955/03/31  Today's Date: 01/03/2020 OT Individual Time: 6712-4580 OT Individual Time Calculation (min): 57 min    Short Term Goals: Week 1:  OT Short Term Goal 1 (Week 1): Pt will complete BSC transfer with min A OT Short Term Goal 1 - Progress (Week 1): Met OT Short Term Goal 2 (Week 1): Pt will complete toileting tasks with min A OT Short Term Goal 2 - Progress (Week 1): Met OT Short Term Goal 3 (Week 1): Pt will don LB clothing with CGA OT Short Term Goal 3 - Progress (Week 1): Met OT Short Term Goal 4 (Week 1): Pt will correctly position BLE in bed with no more than min cueing OT Short Term Goal 4 - Progress (Week 1): Progressing toward goal  Skilled Therapeutic Interventions/Progress Updates:    1;1. Pt received in bed agreeable to OT with no pain. Pt completes A/P transfer EOB>Bsc>w/c with MOD I for toileting and set up to bring BSC to EOB. Pt completes SB transfer into/out of shower with S and bathes/dresses from TTB in shower with MOD I after gathering items from drawers for shower and dressing. Pt and OT go to ADL apartment in kitchen and discuss organization of kitchen, layout, and access from w/c level. Pt able to reach into fridge and low cabinets with VC for locking brakes prior to reaching low or opening fridge. Exited session with pt seated in recliner and RN in room  Therapy Documentation Precautions:  Precautions Precautions: Fall, Other (comment) Precaution Comments: x2 wound vacs Restrictions Weight Bearing Restrictions: Yes RLE Weight Bearing: Non weight bearing LLE Weight Bearing: Non weight bearing General:   Vital Signs: Therapy Vitals Temp: 98.3 F (36.8 C) Pulse Rate: (!) 110 Resp: 14 BP: (!) 145/70 Patient Position (if appropriate): Lying Oxygen Therapy SpO2: 98 % O2 Device: Room Air Pain:   ADL: ADL Eating: Set up Where Assessed-Eating: Bed  level Grooming: Supervision/safety Where Assessed-Grooming: Bed level Upper Body Bathing: Supervision/safety Where Assessed-Upper Body Bathing: Bed level Lower Body Bathing: Minimal assistance Where Assessed-Lower Body Bathing: Bed level Upper Body Dressing: Supervision/safety Where Assessed-Upper Body Dressing: Bed level Lower Body Dressing: Moderate assistance Where Assessed-Lower Body Dressing: Bed level Toileting: Moderate assistance Where Assessed-Toileting: Wheelchair (using urinal) Toilet Transfer: Unable to assess Tub/Shower Transfer: Unable to assess Tub/Shower Transfer Method: Unable to assess [employer] Transfer: Unable to assess [employer] Transfer Method: Unable to assess Vision   Perception    Praxis   Exercises:   Other Treatments:     Therapy/Group: Individual Therapy  Tonny Branch 01/03/2020, 7:01 AM

## 2020-01-04 ENCOUNTER — Inpatient Hospital Stay (HOSPITAL_COMMUNITY): Payer: Medicare Other | Admitting: Physical Therapy

## 2020-01-04 ENCOUNTER — Inpatient Hospital Stay (HOSPITAL_COMMUNITY): Payer: Medicare Other

## 2020-01-04 LAB — GLUCOSE, CAPILLARY
Glucose-Capillary: 225 mg/dL — ABNORMAL HIGH (ref 70–99)
Glucose-Capillary: 157 mg/dL — ABNORMAL HIGH (ref 70–99)
Glucose-Capillary: 254 mg/dL — ABNORMAL HIGH (ref 70–99)
Glucose-Capillary: 402 mg/dL — ABNORMAL HIGH (ref 70–99)
Glucose-Capillary: 354 mg/dL — ABNORMAL HIGH (ref 70–99)
Glucose-Capillary: 297 mg/dL — ABNORMAL HIGH (ref 70–99)
Glucose-Capillary: 244 mg/dL — ABNORMAL HIGH (ref 70–99)

## 2020-01-04 MED ORDER — DULOXETINE HCL 60 MG PO CPEP: 60.0000 mg | ORAL_CAPSULE | Freq: Every day | ORAL | 3 refills | Status: DC

## 2020-01-04 MED ORDER — OXYCODONE HCL 5 MG PO TABS: 5.0000 mg | ORAL_TABLET | ORAL | 0 refills | Status: DC | PRN

## 2020-01-04 MED ORDER — CLOPIDOGREL BISULFATE 75 MG PO TABS: 75.0000 mg | ORAL_TABLET | Freq: Every day | ORAL | 0 refills | Status: DC

## 2020-01-04 MED ORDER — VITAMIN D3 25 MCG (1000 UNIT) PO TABS: 1000.0000 [IU] | ORAL_TABLET | Freq: Every day | ORAL | 0 refills | Status: DC

## 2020-01-04 MED ORDER — BUMETANIDE 2 MG PO TABS: 2.0000 mg | ORAL_TABLET | Freq: Every day | ORAL | 0 refills | Status: DC

## 2020-01-04 MED ORDER — ACETAMINOPHEN 325 MG PO TABS: 325.0000 mg | ORAL_TABLET | ORAL | Status: DC | PRN

## 2020-01-04 MED ORDER — APIXABAN 5 MG PO TABS: 5.0000 mg | ORAL_TABLET | Freq: Two times a day (BID) | ORAL | 0 refills | Status: DC

## 2020-01-04 MED ORDER — METOPROLOL TARTRATE 25 MG PO TABS: 12.5000 mg | ORAL_TABLET | Freq: Two times a day (BID) | ORAL | 0 refills | Status: DC

## 2020-01-04 NOTE — Progress Notes (Signed)
Physical Therapy Session Note  Patient Details  Name: Kaitlin Hudson MRN: [id number] Date of Birth: May 28, 1955  Today's Date: 01/04/2020 PT Individual Time: 6387-5643 PT Individual Time Calculation (min): 42 min   and  Today's Date: 01/04/2020 PT Missed Time: 45 Minutes Missed Time Reason: Other (Comment) (elevated BS - details below)  Short Term Goals: Week 2:  PT Short Term Goal 1 (Week 2): STG=LTG due to ELOS.  Skilled Therapeutic Interventions/Progress Updates:    Session 1: Pt received seated in w/c with her husband present and pt agreeable to therapy session. Pt reports L shoulder "arthritic" pain from some of the exercises she had performed yesterday, unrated, and pt reports that she has received prednisone medication and that plus time are the only things that help with her RA pain. Therapy session focused on D/C planning and ensuring pt/husband have no additional questions/concerns as well as to practice additional hands-on training. Pt/husband report feeling confident performing car transfers after real-life training with their vehicle this past Wednesday. Pt/husband verbalize and demonstrate understanding of wearing shrinkers for residual limb care management. Pt/husband perform R lateral scoot transfer w/c>EOB using transfer board with husband providing proper close supervision for safety but otherwise pt able to perform transfer without hands-on assist - 1x cuing for sequencing of set-up of transfer. Supine<>sit on bed using bedrail for support as needed (pt will have this at home) independently. Had pt perform the following LE exercises from her HEP with her husband's direction to ensure they understood how to perform them without therapist's direction (required mod cuing progressed to no cuing once becoming familiar with the exercises): - supine quad sets (BKA) x10reps with 5 second hold - supine glute sets x10reps with 5 second hold - straight leg raise x10 reps  - supine isometric  hip adduction with towel roll (AKA) x10 reps 5 second hold - sidelying hip abduction x10 reps each  Sidelying exercise was the most challenging for the patient due to some increased pain at incision site with this exercise. Therapist addressed pt/husband questions throughout and at end of session pt left seated EOB with needs in reach and her husband present.  Session 2: Pt received sitting in her w/c with her husband present. Pt reports that her blood sugar is 402 and that she feels very fatigued stating "I just don't feel well." Pt reports that she does not feel comfortable performing therapy with her BS that high. Therapist confirmed that pt/family had no further questions/concerns regarding discharge plan and they report feeling confident with D/C tomorrow. Pt left seated in w/c with needs in reach and her husband present. Therapist ensured Britt Bottom, RN aware of pt's elevated BS. Missed 45 minutes of skilled physical therapy.  Therapy Documentation Precautions:  Precautions Precautions: Fall Precaution Comments: x2 wound vacs Restrictions Weight Bearing Restrictions: Yes RLE Weight Bearing: Non weight bearing LLE Weight Bearing: Non weight bearing  Therapy/Group: Individual Therapy  Ginny Forth , PT, DPT, CSRS  01/04/2020, 8:03 AM

## 2020-01-04 NOTE — Progress Notes (Signed)
[place] PHYSICAL MEDICINE & REHABILITATION PROGRESS NOTE   Subjective/Complaints: No  New issues. Education went well yesterday. She and husband are able to don/doff shrinkers. Pain seems controlled  ROS: Patient denies fever, rash, sore throat, blurred vision, nausea, vomiting, diarrhea, cough, shortness of breath or chest pain,  headache, or mood change.   .   Objective:   No results found. No results for input(s): WBC, HGB, HCT, PLT in the last 72 hours. No results for input(s): NA, K, CL, CO2, GLUCOSE, BUN, CREATININE, CALCIUM in the last 72 hours.  Intake/Output Summary (Last 24 hours) at 01/04/2020 1241 Last data filed at 01/03/2020 1800 Gross per 24 hour  Intake 360 ml  Output --  Net 360 ml    Physical Exam: Vital Signs Blood pressure (!) 140/53, pulse 98, temperature 97.9 F (36.6 C), temperature source Oral, resp. rate 18, height 4\' 3"  (1.295 m), weight 70.5 kg, SpO2 96 %.  Constitutional: No distress . Vital signs reviewed. HEENT: EOMI, oral membranes moist Neck: supple Cardiovascular: RRR without murmur. No JVD    Respiratory/Chest: CTA Bilaterally without wheezes or rales. Normal effort    GI/Abdomen: BS +, non-tender, non-distended Ext: no clubbing, cyanosis, or edema Psych: pleasant and cooperative, smiling! Skin: Bilateral BK incisions with very mild seros-sang discharge, mostlyo dry  MASD along sacrum/buttocks--stable Neurologic: Cranial nerves II through XII intact, motor strength is 5/5 in bilateral deltoid, bicep, tricep, grip, 4- hip flexor. Sensory exam intact Musculoskeletal: B BKA-both stumps expectedly tender  Assessment/Plan: 1. Functional deficits secondary to B BKA which require 3+ hours per day of interdisciplinary therapy in a comprehensive inpatient rehab setting.  Physiatrist is providing close team supervision and 24 hour management of active medical problems listed below.  Physiatrist and rehab team continue to assess barriers to  discharge/monitor patient progress toward functional and medical goals  Care Tool:  Bathing    Body parts bathed by patient: Right arm, Left arm, Chest, Abdomen, Front perineal area, Buttocks, Right upper leg, Left upper leg, Face     Body parts n/a: Right lower leg, Left lower leg (B BKA)   Bathing assist Assist Level: Contact Guard/Touching assist     Upper Body Dressing/Undressing Upper body dressing   What is the patient wearing?: Pull over shirt, Bra    Upper body assist Assist Level: Supervision/Verbal cueing    Lower Body Dressing/Undressing Lower body dressing      What is the patient wearing?: Underwear/pull up, Pants     Lower body assist Assist for lower body dressing: Minimal Assistance - Patient > 75%     Toileting Toileting Toileting Activity did not occur (Clothing management and hygiene only): N/A (no void or bm)  Toileting assist Assist for toileting: Contact Guard/Touching assist     Transfers Chair/bed transfer  Transfers assist     Chair/bed transfer assist level: Contact Guard/Touching assist Chair/bed transfer assistive device: Sliding board   Locomotion Ambulation   Ambulation assist   Ambulation activity did not occur: Safety/medical concerns (New B BKA, B LEs NWB)          Walk 10 feet activity   Assist  Walk 10 feet activity did not occur: Safety/medical concerns        Walk 50 feet activity   Assist Walk 50 feet with 2 turns activity did not occur: Safety/medical concerns         Walk 150 feet activity   Assist Walk 150 feet activity did not occur: Safety/medical concerns  Walk 10 feet on uneven surface  activity   Assist Walk 10 feet on uneven surfaces activity did not occur: Safety/medical concerns         Wheelchair     Assist Will patient use wheelchair at discharge?: Yes Type of Wheelchair: Manual Wheelchair activity did not occur: Safety/medical concerns (increased pain and  anxiety during evalutaiton)  Wheelchair assist level: Independent Max wheelchair distance: 150    Wheelchair 50 feet with 2 turns activity    Assist    Wheelchair 50 feet with 2 turns activity did not occur: Safety/medical concerns   Assist Level: Independent   Wheelchair 150 feet activity     Assist  Wheelchair 150 feet activity did not occur: Safety/medical concerns   Assist Level: Independent   Blood pressure (!) 140/53, pulse 98, temperature 97.9 F (36.6 C), temperature source Oral, resp. rate 18, height 4\' 3"  (1.295 m), weight 70.5 kg, SpO2 96 %.  Medical Problem List and Plan: 1.  Decreased functional mobility secondary to progressive bilateral lower extremity gangrenous changes with recent debridement and skin grafts.  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  Wound VAC as directed             -patient may  shower with legs covered             -ELOS/Goals: 10/23  -Continue CIR  -Patient to see me in the office for transitional care encounter in 1-2 weeks.  2.  Antithrombotics: -DVT/anticoagulation: Eliquis             -antiplatelet therapy: Plavix 3. Pain Management: cymbalta 60mg  daily for phantom pain.  -  oxycodone as needed--using twice a day   10/22: pain is adequatelycontrolled with Tylenol and Oxycodone.   4. Mood: Provide emotional support             -antipsychotic agents: N/A  - Appreciate input from Dr. .   -cymbalta already on board for pain--->continue as outpt--d/w her today  -she has been buoyed by earlier dc date! 5. Neuropsych: This patient is capable of making decisions on her own behalf. 6. Skin/Wound Care: MASD to sacrum/buttocks--keep dry, barrier cream  Wound vac removed on 10/13 without complications  -10/22 continue telfa, dry dressing to incisions with shrinker upon discharge 7. Fluids/Electrolytes/Nutrition: Routine in and outs with follow-up chemistries 8.  Acute blood loss anemia.  Hgb up to 9.9 on 10/11 9.  Rheumatoid  arthritis.  Chronic prednisone 10.  CAD status post cardiac catheterization August 2021 underwent rotational atherectomy assisted multivessel PCI.  Continue Plavix and Eliquis x12 months 11.  CKD stage IIIb.  Baseline creatinine 1.37 12.  Diastolic congestive heart failure.  Monitor for any signs of fluid overload.  Continue Bumex 2 mg daily  -weights down since 10/10. Appear stable 10/22 Filed Weights   01/01/20 0500 01/02/20 0500 01/04/20 0444  Weight: 70.8 kg 70.2 kg 70.5 kg     13.  Hypertension.  Lopressor 12.5 mg twice daily.  Monitor with increased mobility Vitals:   01/03/20 2025 01/04/20 0439  BP: (!) 141/62 (!) 140/53  Pulse: (!) 104 98  Resp: 16 18  Temp: 98.4 F (36.9 C) 97.9 F (36.6 C)  SpO2: 97% 96%  10/15: BP and HR have been labile- continue to monitor TID. Increased lopressor  To 25mg  BID 10/22 improved bp control 14.  Diabetes mellitus TYPE I, not II with peripheral neuropathy.  Hemoglobin A1c 6.3.  Patient with insulin pump prior to admission.  Follow-up diabetic coordinator- will  con't insulin pump, since pt has managed for >60 years.  CBG (last 3)  Recent Labs    01/04/20 0313 01/04/20 0614 01/04/20 1211  GLUCAP 225* 157* 254*  10/12: uncontrolled this morning to 236. 42 on 10/11 evening. Discussed her insulin pump. She will take 2U less than before.   10/13: she discussed with her endocroniologist and they have decreased her basal rate by 20% with good results.  10/22 pt making adjustments to pump. We reviewed today 15. Constipation- 1st BM since admission was today- added Senokot 1 tab daily- also on Dulcolax pills  -moving bowels 16. Urinary and bowel urgency- is chronic for pt- has bedpan right next to her on bed.  17. Itching in residual limbs: benadryl cream ordered and is helping just proximal to incisions--appears to have resolved    LOS: 12 days A FACE TO FACE EVALUATION WAS PERFORMED  Ranelle Oyster 01/04/2020, 12:41 PM

## 2020-01-04 NOTE — Progress Notes (Signed)
Occupational Therapy Discharge Summary  Patient Details  Name: Kaitlin Hudson MRN: [id number] Date of Birth: [birth year]  Today's Date: 01/04/2020 OT Individual Time: 8469-6295 OT Individual Time Calculation (min): 60 min   Session 1: Pt agreeable to bathing and dressing in afternoon session from shower level to demo to husband supervision/set up of transfers. MD in room changing bandages and pt assists with donning shrinker sock. Pt completes toileting/dressing with set up seated on BSC and bed with A/P transfer with MOD I. Pt transfers via SB to w/c with set up andOT provides A to place ambualtee pads. Pt and OT review theraband program briefly but will follow up later during second session. Exited session with pt seated in w/c and call light in reach.  Today's Date: 01/04/2020 OT Individual Time: 1400-1500 OT Individual Time Calculation (min): 60 min   1:1. Pt received in w/c with husband present. Husband provides Hands on A for all BADL tasks including transfers to TTB in shower using SB with husband steadying board only and min VC for w/c placement. Pt, husband and OT discuss set up and options of TTB in shower in prep for DC home. Pt bathes with setup provided by husband after edu re application of occlusives to residual limbs. Pt and husband verbalize understanding that until surgeon states it is ok for limbs to get wet they should remain dry. Pt completes transfer back to w/c with husband steadying board. Pt and husband with no further questions. Exited session after brief review of therex with husband present and use of theraband.      Patient has met 5 of 5 long term goals due to improved activity tolerance, improved balance, postural control, ability to compensate for deficits and improved coordination.  Patient to discharge at overall Supervision level. Pt has made excellent progress in ADLs and functional transfers this reporting period improving to set up/supervision level. Pt husband  has participated in hands on supervision/family training of ADL and feels ready to assist at home at this time. Patient's care partner is independent to provide the necessary physical assistance at discharge.    Reasons goals not met: n/a  Recommendation:  Patient will benefit from ongoing skilled OT services in home health setting to continue to advance functional skills in the area of BADL and iADL.  Equipment: No equipment provided  Reasons for discharge: treatment goals met and discharge from hospital  Patient/family agrees with progress made and goals achieved: Yes  OT Discharge Precautions/Restrictions  Precautions Precautions: Fall Restrictions RLE Weight Bearing: Non weight bearing LLE Weight Bearing: Non weight bearing General   Vital Signs Therapy Vitals Temp: 97.9 F (36.6 C) Temp Source: Oral Pulse Rate: 98 Resp: 18 BP: (!) 140/53 Patient Position (if appropriate): Lying Oxygen Therapy SpO2: 96 % O2 Device: Room Air Pain Pain Assessment Pain Score: 0-No pain ADL ADL Eating: Independent Where Assessed-Eating: Bed level Grooming: Independent Where Assessed-Grooming: Bed level Upper Body Bathing: Setup Where Assessed-Upper Body Bathing: Shower Lower Body Bathing: Setup Where Assessed-Lower Body Bathing: Shower Upper Body Dressing: Setup Where Assessed-Upper Body Dressing:  (shower chair) Lower Body Dressing: Setup Where Assessed-Lower Body Dressing:  (shower chair) Toileting: Supervision/safety Where Assessed-Toileting: Bedside Commode Toilet Transfer: Close supervision Toilet Transfer Method: Theatre manager: Extra wide drop arm bedside commode Tub/Shower Transfer: Close supervison Clinical cytogeneticist Method: Administrator, arts: Nurse, mental health Transfer: Unable to assess Social research officer, government Method: Unable to assess Vision Baseline Vision/History: No visual deficits Patient Visual  Report: No change from baseline Vision Assessment?: No apparent visual deficits Perception  Perception: Within Functional Limits Praxis Praxis: Intact Cognition Overall Cognitive Status: Within Functional Limits for tasks assessed Arousal/Alertness: Awake/alert Attention: Selective Focused Attention: Appears intact Selective Attention: Appears intact Memory: Appears intact Awareness: Appears intact Problem Solving: Appears intact Safety/Judgment: Appears intact Sensation Sensation Light Touch: Appears Intact Hot/Cold: Appears Intact Coordination Gross Motor Movements are Fluid and Coordinated: No Fine Motor Movements are Fluid and Coordinated: Yes Motor  Motor Motor: Other (comment);Abnormal postural alignment and control Motor - Skilled Clinical Observations: generalized weakness, decreased balance due to new B BKAs Motor - Discharge Observations: Continued generalized weakness, decreased balance due to new B BKAs with notable improvement since admission Mobility  Bed Mobility Rolling Right: Independent Rolling Left: Independent Sit to Supine: Independent  Trunk/Postural Assessment  Cervical Assessment Cervical Assessment: Within Functional Limits Thoracic Assessment Thoracic Assessment: Within Functional Limits Lumbar Assessment Lumbar Assessment: Exceptions to [facility] (post pelvic tilt) Postural Control Postural Control: Deficits on evaluation (delayed)  Balance Static Sitting Balance Static Sitting - Balance Support: No upper extremity supported Static Sitting - Level of Assistance: 6: Modified independent (Device/Increase time) Dynamic Sitting Balance Dynamic Sitting - Balance Support: During functional activity;No upper extremity supported Dynamic Sitting - Level of Assistance: 6: Modified independent (Device/Increase time) Extremity/Trunk Assessment RUE Assessment RUE Assessment: Within Functional Limits LUE Assessment LUE Assessment: Within Functional  Limits   Tonny Branch 01/04/2020, 6:57 AM

## 2020-01-05 DIAGNOSIS — G8918 Other acute postprocedural pain: Secondary | ICD-10-CM

## 2020-01-05 DIAGNOSIS — T380X5A Adverse effect of glucocorticoids and synthetic analogues, initial encounter: Secondary | ICD-10-CM

## 2020-01-05 DIAGNOSIS — E1065 Type 1 diabetes mellitus with hyperglycemia: Secondary | ICD-10-CM

## 2020-01-05 DIAGNOSIS — R000 Tachycardia, unspecified: Secondary | ICD-10-CM

## 2020-01-05 DIAGNOSIS — R739 Hyperglycemia, unspecified: Secondary | ICD-10-CM

## 2020-01-05 DIAGNOSIS — I10 Essential (primary) hypertension: Secondary | ICD-10-CM

## 2020-01-05 LAB — GLUCOSE, CAPILLARY
Glucose-Capillary: 242 mg/dL — ABNORMAL HIGH (ref 70–99)
Glucose-Capillary: 183 mg/dL — ABNORMAL HIGH (ref 70–99)

## 2020-01-05 NOTE — Progress Notes (Signed)
[place] PHYSICAL MEDICINE & REHABILITATION PROGRESS NOTE   Subjective/Complaints: Patient seen laying in bed this morning.  She states she slept well overnight.  She is ready for discharge.  ROS: Denies CP, SOB, N/V/D.   Objective:   No results found. No results for input(s): WBC, HGB, HCT, PLT in the last 72 hours. No results for input(s): NA, K, CL, CO2, GLUCOSE, BUN, CREATININE, CALCIUM in the last 72 hours.  Intake/Output Summary (Last 24 hours) at 01/05/2020 1225 Last data filed at 01/04/2020 1800 Gross per 24 hour  Intake 640 ml  Output --  Net 640 ml    Physical Exam: Vital Signs Blood pressure 127/66, pulse (!) 102, temperature 97.9 F (36.6 C), resp. rate 16, height 4\' 3"  (1.295 m), weight 69.4 kg, SpO2 100 %. Constitutional: No distress . Vital signs reviewed. HENT: Normocephalic.  Atraumatic. Eyes: EOMI. No discharge. Cardiovascular: No JVD.  RRR. Respiratory: Normal effort.  No stridor.  Bilateral clear to auscultation. GI: Non-distended.  BS +. Skin: Warm and dry.  Bilateral stumps with shrinker CDI Psych: Normal mood.  Normal behavior. Musc: Bilateral BKA Neurologic: Bilateral lower extremities: Hip flexion, knee extension 4/5  Assessment/Plan: 1. Functional deficits secondary to B BKA which require 3+ hours per day of interdisciplinary therapy in a comprehensive inpatient rehab setting.  Physiatrist is providing close team supervision and 24 hour management of active medical problems listed below.  Physiatrist and rehab team continue to assess barriers to discharge/monitor patient progress toward functional and medical goals  Care Tool:  Bathing    Body parts bathed by patient: Right arm, Left arm, Chest, Abdomen, Front perineal area, Buttocks, Right upper leg, Left upper leg, Face     Body parts n/a: Right lower leg, Left lower leg   Bathing assist Assist Level: Set up assist     Upper Body Dressing/Undressing Upper body dressing   What is  the patient wearing?: Pull over shirt, Bra    Upper body assist Assist Level: Set up assist    Lower Body Dressing/Undressing Lower body dressing      What is the patient wearing?: Underwear/pull up, Pants     Lower body assist Assist for lower body dressing: Set up assist     Toileting Toileting Toileting Activity did not occur (Clothing management and hygiene only): N/A (no void or bm)  Toileting assist Assist for toileting: Set up assist     Transfers Chair/bed transfer  Transfers assist     Chair/bed transfer assist level: Supervision/Verbal cueing Chair/bed transfer assistive device: Sliding board   Locomotion Ambulation   Ambulation assist   Ambulation activity did not occur: Safety/medical concerns          Walk 10 feet activity   Assist  Walk 10 feet activity did not occur: Safety/medical concerns        Walk 50 feet activity   Assist Walk 50 feet with 2 turns activity did not occur: Safety/medical concerns         Walk 150 feet activity   Assist Walk 150 feet activity did not occur: Safety/medical concerns         Walk 10 feet on uneven surface  activity   Assist Walk 10 feet on uneven surfaces activity did not occur: Safety/medical concerns         Wheelchair     Assist Will patient use wheelchair at discharge?: Yes Type of Wheelchair: Manual Wheelchair activity did not occur: Safety/medical concerns (increased pain and anxiety during evalutaiton)  Wheelchair assist level: Independent Max wheelchair distance: 199ft    Wheelchair 50 feet with 2 turns activity    Assist    Wheelchair 50 feet with 2 turns activity did not occur: Safety/medical concerns   Assist Level: Independent   Wheelchair 150 feet activity     Assist  Wheelchair 150 feet activity did not occur: Safety/medical concerns   Assist Level: Independent   Blood pressure 127/66, pulse (!) 102, temperature 97.9 F (36.6 C), resp. rate  16, height 4\' 3"  (1.295 m), weight 69.4 kg, SpO2 100 %.  Medical Problem List and Plan: 1.  Decreased functional mobility secondary to progressive bilateral lower extremity gangrenous changes with recent debridement and skin grafts.  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  Wound VAC as directed  DC today  Patient to follow-up in the office for transitional care encounter in 1-2 weeks.  2.  Antithrombotics: -DVT/anticoagulation: Eliquis             -antiplatelet therapy: Plavix 3. Pain Management: cymbalta 60mg  daily for phantom pain.  oxycodone as needed  Controlled with meds on 10/23 4. Mood: Provide emotional support             -antipsychotic agents: N/A  - Appreciate input from Dr. .   -Continue cymbalta  5. Neuropsych: This patient is capable of making decisions on her own behalf. 6. Skin/Wound Care: MASD to sacrum/buttocks--keep dry, barrier cream  Continue telfa, dry dressing to incisions with shrinker upon discharge 7. Fluids/Electrolytes/Nutrition: Routine in and outs 8.  Acute blood loss anemia.    Hemoglobin 9.9 on 10/11 9.  Rheumatoid arthritis.  Chronic prednisone 10.  CAD status post cardiac catheterization August 2021 underwent rotational atherectomy assisted multivessel PCI.  Continue Plavix and Eliquis x12 months 11.  CKD stage IIIb.  Baseline creatinine 1.37  Creatinine 1.22 on 10/11 12.  Diastolic congestive heart failure.  Monitor for any signs of fluid overload.  Continue Bumex 2 mg daily  Filed Weights   01/02/20 0500 01/04/20 0444 01/05/20 0457  Weight: 70.2 kg 70.5 kg 69.4 kg   Stable on 10/23  13.  Hypertension/tachycardia.  Monitor with increased mobility Vitals:   01/04/20 2022 01/05/20 0528  BP: 138/62 127/66  Pulse: (!) 110 (!) 102  Resp: 18 16  Temp: 98.4 F (36.9 C) 97.9 F (36.6 C)  SpO2: 98% 100%   Increased lopressor  To 25mg  BID  Blood pressure controlled, heart rate remains elevated, continue to monitor in ambulatory setting with  further adjustments. 14.    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  Hemoglobin A1c 6.3.  Patient with insulin pump prior to admission.  Follow-up diabetic coordinator- will con't insulin pump, since pt has managed for >60 years.  CBG (last 3)  Recent Labs    01/04/20 2109 01/05/20 0232 01/05/20 0616  GLUCAP 244* 242* 183*   Labile on 10/23, continue to monitor in ambulatory setting with endocrinologist for further adjustments as necessary. 15. Constipation-   -moving bowels 16. Urinary and bowel urgency- is chronic for pt 17. Itching in residual limbs: benadryl cream ordered and is helping just proximal to incisions--appears to have resolved   LOS: 13 days A FACE TO FACE EVALUATION WAS PERFORMED  Rudolf Blizard 01/07/20 01/05/2020, 12:25 PM

## 2020-01-07 ENCOUNTER — Telehealth: Payer: Self-pay | Admitting: Orthopedic Surgery

## 2020-01-07 ENCOUNTER — Telehealth: Payer: Self-pay | Admitting: Registered Nurse

## 2020-01-07 NOTE — Telephone Encounter (Signed)
FYI

## 2020-01-07 NOTE — Progress Notes (Signed)
Inpatient Rehabilitation Care Coordinator  Discharge Note  The overall goal for the admission was met for:   Discharge location: Yes. D/c to home with husband who will provide 24/7 care.  Length of Stay: Yes. 12 days.   Discharge activity level: Yes. Mod I for w/c mobility; CGA with transfers  Home/community participation: Yes. Limited.   Services provided included: MD, RD, PT, OT, RN, CM, TR, Pharmacy, Neuropsych and SW  Financial Services: Medicare and Private Insurance: [place]  Follow-up services arranged: Home Health: [facility] for HHPT/OT/SN and DME: [place] for slide board  Comments (or additional information): contact pt [phone] or pt husband [name] [phone]  Patient/Family verbalized understanding of follow-up arrangements: Yes  Individual responsible for coordination of the follow-up plan: Pt to have assistance with coordinating care needs.   Confirmed correct DME delivered: Rana Snare 01/07/2020    Loralee Pacas, MSW, [place] Office: [phone] Cell: [phone] Fax: [phone]

## 2020-01-07 NOTE — Telephone Encounter (Signed)
Kaitlin Hudson with [facility] called stating they would be waiting til after the pts appt on 01/08/20 with [place]; [facility] states they will start care 01/09/20  [facility] [place] CB# [id number]

## 2020-01-07 NOTE — Telephone Encounter (Signed)
Transitional Care call  Patient name: Kaitlin Hudson DOB: 12-Jun-1955 1. Are you/is patient experiencing any problems since coming home? No a. Are there any questions regarding any aspect of care? No 2. Are there any questions regarding medications administration/dosing? No a. Are meds being taken as prescribed? Yes b. "Patient should review meds with caller to confirm" Medication List Reviewed.  3. Have there been any falls? No 4. Has Home Health been to the house and/or have they contacted you? Yes: [facility]. a. If not, have you tried to contact them? NA: See Above b. Can we help you contact them? NA: See Above. 5. Are bowels and bladder emptying properly? Yes, Reports she has vaginal burning, she called her PCP awaiting on return call, she reports. a. Are there any unexpected incontinence issues? No b. If applicable, is patient following bowel/bladder programs? NA 6. Any fevers, problems with breathing, unexpected pain? No 7. Are there any skin problems or new areas of breakdown? No 8. Has the patient/family member arranged specialty MD follow up (ie cardiology/neurology/renal/surgical/etc.)?  She has an appointment with Dr Lajoyce Corners on 01/08/2020. Awaiting return call from PCP to schedule appointment.  a. Can we help arrange? No 9. Does the patient need any other services or support that we can help arrange? No 10. Are caregivers following through as expected in assisting the patient? Yes 11. Has the patient quit smoking, drinking alcohol, or using drugs as recommended? (                        )  Appointment date/time 01/17/2020  arrival time 11:20 for 11:40 appointment with Dr Carlis Abbott. At [address] [facility] [address]

## 2020-01-08 ENCOUNTER — Ambulatory Visit (INDEPENDENT_AMBULATORY_CARE_PROVIDER_SITE_OTHER): Payer: Medicare Other | Admitting: Orthopedic Surgery

## 2020-01-08 DIAGNOSIS — Z89511 Acquired absence of right leg below knee: Secondary | ICD-10-CM

## 2020-01-08 DIAGNOSIS — Z89512 Acquired absence of left leg below knee: Secondary | ICD-10-CM

## 2020-01-08 DIAGNOSIS — S88119A Complete traumatic amputation at level between knee and ankle, unspecified lower leg, initial encounter: Secondary | ICD-10-CM

## 2020-01-09 ENCOUNTER — Encounter: Payer: Self-pay | Admitting: Orthopedic Surgery

## 2020-01-09 NOTE — Progress Notes (Signed)
Office Visit Note   Patient: Kaitlin Hudson           Date of Birth: [birth year]           MRN: [id number] Visit Date: 01/08/2020              Requested by: No referring provider defined for this encounter. PCP: Earley Abide (Inactive)  Chief Complaint  Patient presents with  . Left Leg - Routine Post Op  . Right Leg - Routine Post Op      HPI: Patient is a 64 year old woman who presents 3 weeks out from bilateral transtibial amputations.  Patient currently has a dry nonadherent dressing against the wound with the stump fracture on top.  Assessment & Plan: Visit Diagnoses:  1. Below knee amputation [facility])     Plan: Patient needs a smaller stump shrinker order was provided for this.  She also was given application for handicap parking.  Recommended 30 g of protein powder supplement twice a day.  Recommended wearing the stump shrinker 24 hours a day directly against the incision.  Follow-Up Instructions: Return in about 2 weeks (around 01/22/2020).   Ortho Exam  Patient is alert, oriented, no adenopathy, well-dressed, normal affect, normal respiratory effort. Examination there is maceration along the wound edge from a nonadherent dressing being against the wound there is no cellulitis no purulent drainage no bleeding.  Imaging: No results found. No images are attached to the encounter.  Labs: Lab Results  Component Value Date   HGBA1C 6.3 (H) 12/19/2019   HGBA1C 7.2 (H) 08/15/2019   LABURIC 2.5 09/11/2013   REPTSTATUS 08/20/2019 FINAL 08/15/2019   GRAMSTAIN  08/15/2019    RARE WBC PRESENT, PREDOMINANTLY PMN ABUNDANT GRAM POSITIVE RODS FEW GRAM POSITIVE COCCI RARE GRAM VARIABLE ROD    CULT  08/15/2019    ABUNDANT CORYNEBACTERIUM STRIATUM ABUNDANT HAEMOPHILUS PARAINFLUENZAE BETA LACTAMASE NEGATIVE NO ANAEROBES ISOLATED Performed at [facility], [address], [place], [place] [address]    LABORGA ESCHERICHIA COLI (A) 11/07/2015     Lab Results    Component Value Date   ALBUMIN 2.2 (L) 12/24/2019   ALBUMIN 3.1 (L) 07/21/2019   ALBUMIN 3.2 (L) 11/07/2015   LABURIC 2.5 09/11/2013    No results found for: MG No results found for: VD25OH  No results found for: PREALBUMIN CBC EXTENDED Latest Ref Rng & Units 12/24/2019 12/21/2019 12/20/2019  WBC 4.0 - 10.5 K/uL 8.8 10.5 -  RBC 3.87 - 5.11 MIL/uL 3.54(L) 3.10(L) -  HGB 12.0 - 15.0 g/dL 6.0(V) 3.7(T) 0.6(Y)  HCT 36 - 46 % 31.5(L) 27.6(L) 26.8(L)  PLT 150 - 400 K/uL 466(H) 263 -  NEUTROABS 1.7 - 7.7 K/uL 5.9 - -  LYMPHSABS 0.7 - 4.0 K/uL 1.5 - -     There is no height or weight on file to calculate BMI.  Orders:  No orders of the defined types were placed in this encounter.  No orders of the defined types were placed in this encounter.    Procedures: No procedures performed  Clinical Data: No additional findings.  ROS:  All other systems negative, except as noted in the HPI. Review of Systems  Objective: Vital Signs: There were no vitals taken for this visit.  Specialty Comments:  No specialty comments available.  PMFS History: Patient Active Problem List   Diagnosis Date Noted  . Steroid-induced hyperglycemia   . Controlled type 1 diabetes mellitus with hyperglycemia (HCC)   . Essential hypertension   .  Sinus tachycardia   . Postoperative pain   . S/P bilateral below knee amputation (HCC) 12/23/2019  . Below-knee amputation (HCC) 12/19/2019  . Gangrene of both feet (HCC)   . Heel ulcer, right, with necrosis of muscle (HCC)   . Chronic diastolic heart failure (HCC) 09/12/2013  . Rheumatoid arthritis (HCC) 09/12/2013  . IDDM (insulin dependent diabetes mellitus) 09/09/2013  . Cellulitis of right lower extremity 09/08/2013   Past Medical History:  Diagnosis Date  . Arthritis, rheumatoid (HCC)   . CHF (congestive heart failure) (HCC)    pt denies this dx  . Coronary artery disease   . Diabetes mellitus without complication (HCC)   . Headache   .  Hyperlipidemia   . Hypertension   . Latent tuberculosis    pt denies this dx  . Peripheral vascular disease (HCC)    blood clot in left leg  . Type 1 diabetes (HCC)     No family history on file.  Past Surgical History:  Procedure Laterality Date  . AMPUTATION Bilateral 12/19/2019   Procedure: BILATERAL BELOW KNEE AMPUTATION;  Surgeon: Nadara Mustard, MD;  Location: [facility] OR;  Service: Orthopedics;  Laterality: Bilateral;  . CARDIAC CATHETERIZATION  11/02/2019  . CARPAL TUNNEL RELEASE Bilateral   . EYE SURGERY Bilateral    09/2018  . FINGER SURGERY  07/11/2014   right middle finger - nodule removed  . FINGER SURGERY  02/20/2017   right middle finger  . FOOT SURGERY Right 02/26/2014   screw  . I & D EXTREMITY Right 08/15/2019   Procedure: RIGHT ACHILLES DEBRIDEMENT, POSSIBLE SKIN GRAFT;  Surgeon: Nadara Mustard, MD;  Location: [facility] OR;  Service: Orthopedics;  Laterality: Right;  . JOINT REPLACEMENT    . SHOULDER SURGERY Right 01/06/2005  . TRIGGER FINGER RELEASE  12/202002   left hand -   . WRIST SURGERY Left 10/23/2004   and middle finger  . WRIST SURGERY Right 11/05/2005  . WRIST SURGERY Right 03/11/2006   cyst removed   Social History   Occupational History  . Not on file  Tobacco Use  . Smoking status: Never Smoker  . Smokeless tobacco: Never Used  Vaping Use  . Vaping Use: Never used  Substance and Sexual Activity  . Alcohol use: No  . Drug use: No  . Sexual activity: Not on file

## 2020-01-17 ENCOUNTER — Other Ambulatory Visit: Payer: Self-pay

## 2020-01-17 ENCOUNTER — Encounter: Payer: Self-pay | Admitting: Physical Medicine and Rehabilitation

## 2020-01-17 ENCOUNTER — Encounter
Payer: Medicare Other | Attending: Physical Medicine and Rehabilitation | Admitting: Physical Medicine and Rehabilitation

## 2020-01-17 VITALS — BP 134/78 | HR 86 | Temp 98.6°F | Wt 153.0 lb

## 2020-01-17 DIAGNOSIS — Z89512 Acquired absence of left leg below knee: Secondary | ICD-10-CM | POA: Diagnosis not present

## 2020-01-17 DIAGNOSIS — S88119A Complete traumatic amputation at level between knee and ankle, unspecified lower leg, initial encounter: Secondary | ICD-10-CM | POA: Insufficient documentation

## 2020-01-17 DIAGNOSIS — Z89511 Acquired absence of right leg below knee: Secondary | ICD-10-CM | POA: Diagnosis not present

## 2020-01-17 DIAGNOSIS — M069 Rheumatoid arthritis, unspecified: Secondary | ICD-10-CM | POA: Diagnosis present

## 2020-01-17 NOTE — Patient Instructions (Signed)
Blue emu oil 

## 2020-01-17 NOTE — Progress Notes (Signed)
Subjective:    Patient ID: Kaitlin Hudson, female    DOB: 08/19/1955, 64 y.o.   MRN: [id number]  HPI  Kaitlin Hudson is a 64 year old woman who presents for transitional care follow-up after CIR admission for bilateral BKA 12/19/19.   Has been getting home therapy and they have been doing a good job. She thinks one of the exercises may have aggravated the rheumatoid arthritis in her right shoulder and this pains her when she lifts her shoulder. Her left shoulder has also started to hurt. This has limited her ability to participate in therapy. She is using voltaren cream there.   Pain is currently 0/10. She does have a little nerve pain occassionally. She has been taking the Cymbalta during the day. She tried one day not using it and by night time she had pain. She is getting headaches and think she could be from the Cymbalta.   Husband has been changing the dressings every day.   She has followed up with her endocrinologist and CBGs have been well controlled. She cannot get steroid injections given her DM.    Pain Inventory Average Pain 0 Pain Right Now 0 My pain is no pain currently  In the last 24 hours, has pain interfered with the following? General activity 0 Relation with others 0 Enjoyment of life 0 What TIME of day is your pain at its worst? varies Sleep (in general) Fair  Pain is worse with: unsure Pain improves with: ? Relief from Meds: no pain meds currently  use a wheelchair  employed # of hrs/week 0 disabled: date disabled medical leave  trouble walking  tc  tc    History reviewed. No pertinent family history. Social History   Socioeconomic History  . Marital status: Married    Spouse name: Not on file  . Number of children: Not on file  . Years of education: Not on file  . Highest education level: Not on file  Occupational History  . Not on file  Tobacco Use  . Smoking status: Never Smoker  . Smokeless tobacco: Never Used  Vaping Use  . Vaping Use:  Never used  Substance and Sexual Activity  . Alcohol use: No  . Drug use: No  . Sexual activity: Not on file  Other Topics Concern  . Not on file  Social History Narrative  . Not on file   Social Determinants of Health   Financial Resource Strain:   . Difficulty of Paying Living Expenses: Not on file  Food Insecurity:   . Worried About Programme researcher, broadcasting/film/video in the Last Year: Not on file  . Ran Out of Food in the Last Year: Not on file  Transportation Needs:   . Lack of Transportation (Medical): Not on file  . Lack of Transportation (Non-Medical): Not on file  Physical Activity:   . Days of Exercise per Week: Not on file  . Minutes of Exercise per Session: Not on file  Stress:   . Feeling of Stress : Not on file  Social Connections:   . Frequency of Communication with Friends and Family: Not on file  . Frequency of Social Gatherings with Friends and Family: Not on file  . Attends Religious Services: Not on file  . Active Member of Clubs or Organizations: Not on file  . Attends Banker Meetings: Not on file  . Marital Status: Not on file   Past Surgical History:  Procedure Laterality Date  . AMPUTATION Bilateral  12/19/2019   Procedure: BILATERAL BELOW KNEE AMPUTATION;  Surgeon: Nadara Mustard, MD;  Location: [facility] OR;  Service: Orthopedics;  Laterality: Bilateral;  . CARDIAC CATHETERIZATION  11/02/2019  . CARPAL TUNNEL RELEASE Bilateral   . EYE SURGERY Bilateral    09/2018  . FINGER SURGERY  07/11/2014   right middle finger - nodule removed  . FINGER SURGERY  02/20/2017   right middle finger  . FOOT SURGERY Right 02/26/2014   screw  . I & D EXTREMITY Right 08/15/2019   Procedure: RIGHT ACHILLES DEBRIDEMENT, POSSIBLE SKIN GRAFT;  Surgeon: Nadara Mustard, MD;  Location: [facility] OR;  Service: Orthopedics;  Laterality: Right;  . JOINT REPLACEMENT    . SHOULDER SURGERY Right 01/06/2005  . TRIGGER FINGER RELEASE  12/202002   left hand -   . WRIST SURGERY Left 10/23/2004    and middle finger  . WRIST SURGERY Right 11/05/2005  . WRIST SURGERY Right 03/11/2006   cyst removed   Past Medical History:  Diagnosis Date  . Arthritis, rheumatoid (HCC)   . CHF (congestive heart failure) (HCC)    pt denies this dx  . Coronary artery disease   . Diabetes mellitus without complication (HCC)   . Headache   . Hyperlipidemia   . Hypertension   . Latent tuberculosis    pt denies this dx  . Peripheral vascular disease (HCC)    blood clot in left leg  . Type 1 diabetes (HCC)    BP 134/78   Pulse 86   Temp 98.6 F (37 C)   SpO2 99%   Opioid Risk Score:   Fall Risk Score:  `1  Depression screen PHQ 2/9  Depression screen PHQ 2/9 01/17/2020  Decreased Interest 0  Down, Depressed, Hopeless 0  PHQ - 2 Score 0  Altered sleeping 0  Tired, decreased energy 1  Change in appetite 0  Feeling bad or failure about yourself  0  Trouble concentrating 0  Moving slowly or fidgety/restless 0  Suicidal thoughts 0  PHQ-9 Score 1    Review of Systems  Constitutional: Negative.   HENT: Negative.   Eyes: Negative.   Respiratory: Negative.   Gastrointestinal: Negative.   Endocrine: Negative.   Genitourinary: Negative.   Musculoskeletal: Negative.   Skin: Negative.   Allergic/Immunologic: Negative.   Neurological: Positive for headaches.  Hematological: Negative.   Psychiatric/Behavioral: Negative.   All other systems reviewed and are negative.      Objective:   Physical Exam Gen: no distress, normal appearing HEENT: oral mucosa pink and moist, NCAT Cardio: Reg rate Chest: normal effort, normal rate of breathing Abd: soft, non-distended Ext: no edema Skin: intact Neuro: Alert and oriented x3 Musculoskeletal: Bilateral BKA with 4+/5 bilateral hip flexion strength.  Psych: pleasant, normal affect    Assessment & Plan:  Kaitlin Hudson is a 64 year old woman who presents for transitional care follow-up after CIR admission for bilateral BKA.  1) Bilateral  BKA -Healing well -she has had follow-up with Dr. Lajoyce Corners -She has received a handicap placard -She has started home therapy and shared her hospital exercises with her provider and is progressing well.   2) Phantom limb pain -Continue Cymbalta as when she tried to wean this, pain increased.  -She does not need a refill at this time.  -After one month, can trial wean off the medication again.  -Stop Oxycodone.  3) Bilateral shoulder RA -Bilateral joint injections performed today with lidocaine and normal saline (prednisone not recommended given  her DM). Procedure note below.  -Can continue voltaren gel. Also recommended blue emu oil which can be both safer and more effective  4) DM Type 2: -She has follow-up with endocrinology and CBGs have been well controlled.   5) Home support: Has home support of her husband.  Transitional care appointment was necessary given patient's pain inhibiting full participation in home therapies.   Shoulder injection, bilateral  Indication:Bilateral shoulder pain not relieved by medication management and other conservative care.  Informed consent was obtained after describing risks and benefits of the procedure with the patient, this includes bleeding, bruising, infection and medication side effects. The patient wishes to proceed and has given written consent. Patient was placed in a seated position. The bilateral shoulders were marked posteriorly and prepped with betadine. A 25-gauge 1-1/2 inch needle was inserted posteriorly into the glenohumeral joints bilaterally. After negative draw back for blood, a solution containing 5 mL of 1% lidocaine and normal saline combination was injected. A band aid was applied. The patient tolerated the procedure well. Post procedure instructions were given.

## 2020-01-22 ENCOUNTER — Ambulatory Visit (INDEPENDENT_AMBULATORY_CARE_PROVIDER_SITE_OTHER): Payer: Medicare Other | Admitting: Orthopedic Surgery

## 2020-01-22 ENCOUNTER — Encounter: Payer: Self-pay | Admitting: Orthopedic Surgery

## 2020-01-22 VITALS — Ht <= 58 in | Wt 153.0 lb

## 2020-01-22 DIAGNOSIS — I739 Peripheral vascular disease, unspecified: Secondary | ICD-10-CM

## 2020-01-22 DIAGNOSIS — Z89512 Acquired absence of left leg below knee: Secondary | ICD-10-CM

## 2020-01-22 DIAGNOSIS — S88119A Complete traumatic amputation at level between knee and ankle, unspecified lower leg, initial encounter: Secondary | ICD-10-CM

## 2020-01-22 NOTE — Progress Notes (Signed)
Office Visit Note   Patient: Kaitlin Hudson           Date of Birth: 06/14/1955           MRN: [id number] Visit Date: 01/22/2020              Requested by: No referring provider defined for this encounter. PCP: Earley Abide (Inactive)  Chief Complaint  Patient presents with  . Right Leg - Routine Post Op    12/19/19 bilateral BKA   . Left Leg - Routine Post Op      HPI: Patient is a 64 year old woman who is status post bilateral below the knee amputations.  Patient has ischemic burning pain with swelling.  She is 4 weeks out from surgery.  Assessment & Plan: Visit Diagnoses:  1. Below knee amputation (HCC)   2. PVD (peripheral vascular disease) (HCC)     Plan: Discussed with the patient she does have some ischemic changes along the wound edges.  Discussed the importance of minimizing swelling recommended elevation range of motion exercises and compression.  Discussed the importance of protein supplements.  Patient states she currently has not found any protein supplements that she can tolerate.  Recommend this twice a day.  Follow-Up Instructions: Return in about 2 weeks (around 02/05/2020).   Ortho Exam  Patient is alert, oriented, no adenopathy, well-dressed, normal affect, normal respiratory effort. Examination patient has pitting edema of both legs there is no cellulitis no odor no drainage there is some mild ischemic changes along the incision line bilaterally.  We will harvest the staples today she is wearing a 2 extra-large stump shrinker.  Imaging: No results found. No images are attached to the encounter.  Labs: Lab Results  Component Value Date   HGBA1C 6.3 (H) 12/19/2019   HGBA1C 7.2 (H) 08/15/2019   LABURIC 2.5 09/11/2013   REPTSTATUS 08/20/2019 FINAL 08/15/2019   GRAMSTAIN  08/15/2019    RARE WBC PRESENT, PREDOMINANTLY PMN ABUNDANT GRAM POSITIVE RODS FEW GRAM POSITIVE COCCI RARE GRAM VARIABLE ROD    CULT  08/15/2019    ABUNDANT CORYNEBACTERIUM  STRIATUM ABUNDANT HAEMOPHILUS PARAINFLUENZAE BETA LACTAMASE NEGATIVE NO ANAEROBES ISOLATED Performed at [facility], [address], [place], [place] [address]    LABORGA ESCHERICHIA COLI (A) 11/07/2015     Lab Results  Component Value Date   ALBUMIN 2.2 (L) 12/24/2019   ALBUMIN 3.1 (L) 07/21/2019   ALBUMIN 3.2 (L) 11/07/2015   LABURIC 2.5 09/11/2013    No results found for: MG No results found for: VD25OH  No results found for: PREALBUMIN CBC EXTENDED Latest Ref Rng & Units 12/24/2019 12/21/2019 12/20/2019  WBC 4.0 - 10.5 K/uL 8.8 10.5 -  RBC 3.87 - 5.11 MIL/uL 3.54(L) 3.10(L) -  HGB 12.0 - 15.0 g/dL 5.3(G) 9.9(M) 4.2(A)  HCT 36 - 46 % 31.5(L) 27.6(L) 26.8(L)  PLT 150 - 400 K/uL 466(H) 263 -  NEUTROABS 1.7 - 7.7 K/uL 5.9 - -  LYMPHSABS 0.7 - 4.0 K/uL 1.5 - -     Body mass index is 41.36 kg/m.  Orders:  No orders of the defined types were placed in this encounter.  No orders of the defined types were placed in this encounter.    Procedures: No procedures performed  Clinical Data: No additional findings.  ROS:  All other systems negative, except as noted in the HPI. Review of Systems  Objective: Vital Signs: Ht 4\' 3"  (1.295 m)   Wt 153 lb (69.4 kg)  BMI 41.36 kg/m   Specialty Comments:  No specialty comments available.  PMFS History: Patient Active Problem List   Diagnosis Date Noted  . Steroid-induced hyperglycemia   . Controlled type 1 diabetes mellitus with hyperglycemia (HCC)   . Essential hypertension   . Sinus tachycardia   . Postoperative pain   . S/P bilateral below knee amputation (HCC) 12/23/2019  . Below-knee amputation (HCC) 12/19/2019  . Gangrene of both feet (HCC)   . Heel ulcer, right, with necrosis of muscle (HCC)   . Chronic diastolic heart failure (HCC) 09/12/2013  . Rheumatoid arthritis (HCC) 09/12/2013  . IDDM (insulin dependent diabetes mellitus) 09/09/2013  . Cellulitis of right lower extremity 09/08/2013   Past  Medical History:  Diagnosis Date  . Arthritis, rheumatoid (HCC)   . CHF (congestive heart failure) (HCC)    pt denies this dx  . Coronary artery disease   . Diabetes mellitus without complication (HCC)   . Headache   . Hyperlipidemia   . Hypertension   . Latent tuberculosis    pt denies this dx  . Peripheral vascular disease (HCC)    blood clot in left leg  . Type 1 diabetes (HCC)     No family history on file.  Past Surgical History:  Procedure Laterality Date  . AMPUTATION Bilateral 12/19/2019   Procedure: BILATERAL BELOW KNEE AMPUTATION;  Surgeon: Nadara Mustard, MD;  Location: [facility] OR;  Service: Orthopedics;  Laterality: Bilateral;  . CARDIAC CATHETERIZATION  11/02/2019  . CARPAL TUNNEL RELEASE Bilateral   . EYE SURGERY Bilateral    09/2018  . FINGER SURGERY  07/11/2014   right middle finger - nodule removed  . FINGER SURGERY  02/20/2017   right middle finger  . FOOT SURGERY Right 02/26/2014   screw  . I & D EXTREMITY Right 08/15/2019   Procedure: RIGHT ACHILLES DEBRIDEMENT, POSSIBLE SKIN GRAFT;  Surgeon: Nadara Mustard, MD;  Location: [facility] OR;  Service: Orthopedics;  Laterality: Right;  . JOINT REPLACEMENT    . SHOULDER SURGERY Right 01/06/2005  . TRIGGER FINGER RELEASE  12/202002   left hand -   . WRIST SURGERY Left 10/23/2004   and middle finger  . WRIST SURGERY Right 11/05/2005  . WRIST SURGERY Right 03/11/2006   cyst removed   Social History   Occupational History  . Not on file  Tobacco Use  . Smoking status: Never Smoker  . Smokeless tobacco: Never Used  Vaping Use  . Vaping Use: Never used  Substance and Sexual Activity  . Alcohol use: No  . Drug use: No  . Sexual activity: Not on file

## 2020-01-31 ENCOUNTER — Telehealth: Payer: Self-pay

## 2020-01-31 NOTE — Telephone Encounter (Signed)
Patient called in wanting to get another wheelchair says the original wheelchair is too large and hard for her to pull . Wants a smaller wheelchair . Says medicare is not going to pay for a second one. Says she is willing to pay for it . The place she is at currently will give her a smaller wheelchair but they will need dr duda to submit a prescription. Says prescription can be faxed to the facility below.    [phone] [facility]

## 2020-02-01 ENCOUNTER — Other Ambulatory Visit: Payer: Self-pay

## 2020-02-01 NOTE — Telephone Encounter (Signed)
This has been faxed to number provided.

## 2020-02-05 ENCOUNTER — Ambulatory Visit (INDEPENDENT_AMBULATORY_CARE_PROVIDER_SITE_OTHER): Payer: Medicare Other | Admitting: Orthopedic Surgery

## 2020-02-05 ENCOUNTER — Telehealth: Payer: Self-pay

## 2020-02-05 ENCOUNTER — Encounter: Payer: Self-pay | Admitting: Orthopedic Surgery

## 2020-02-05 DIAGNOSIS — Z89511 Acquired absence of right leg below knee: Secondary | ICD-10-CM

## 2020-02-05 DIAGNOSIS — Z89512 Acquired absence of left leg below knee: Secondary | ICD-10-CM

## 2020-02-05 DIAGNOSIS — S88119A Complete traumatic amputation at level between knee and ankle, unspecified lower leg, initial encounter: Secondary | ICD-10-CM

## 2020-02-05 MED ORDER — METHOCARBAMOL 500 MG PO TABS: 500.0000 mg | ORAL_TABLET | Freq: Three times a day (TID) | ORAL | 3 refills | Status: DC | PRN

## 2020-02-05 NOTE — Telephone Encounter (Signed)
I called and lm on vm to advise ok to d/c [facility] to call with questions.

## 2020-02-05 NOTE — Progress Notes (Signed)
Office Visit Note   Patient: Kaitlin Hudson           Date of Birth: Jul 13, 1955           MRN: [id number] Visit Date: 02/05/2020              Requested by: No referring provider defined for this encounter. PCP: Earley Abide (Inactive)  Chief Complaint  Patient presents with  . Left Leg - Routine Post Op  . Right Leg - Routine Post Op      HPI: Patient is a 64 year old woman who presents approximately 2 weeks status post bilateral transtibial amputation. She states the shrinker is making her feel hot she states she has had some drainage left worse than right with swelling. She states she is not taking any pain medicine she states that her physicians have increased her dose of prednisone to help with the muscle spasms in her back. She states she is taking a protein supplement.  Assessment & Plan: Visit Diagnoses:  1. Below knee amputation (HCC)     Plan: Recommend that she reduce her prednisone back to her normal dose a prescription for Robaxin is called in for the muscle spasms in her back. Recommended Dial soap cleansing daily with wearing the compression stockings.  Follow-Up Instructions: Return in about 2 weeks (around 02/19/2020).   Ortho Exam  Patient is alert, oriented, no adenopathy, well-dressed, normal affect, normal respiratory effort. Examination patient has fibrinous tissue along the wound edges. This was debrided sharply with a 10 blade knife there is also a significant amount of absorbable suture that has not been absorbed and this was removed from both residual wound. There is good bleeding granulation tissue there is no depth to the wound but the wound dehiscence involves almost the entire incision bilaterally. Iodosorb and a dry dressing was applied today.  Imaging: No results found. No images are attached to the encounter.  Labs: Lab Results  Component Value Date   HGBA1C 6.3 (H) 12/19/2019   HGBA1C 7.2 (H) 08/15/2019   LABURIC 2.5 09/11/2013    REPTSTATUS 08/20/2019 FINAL 08/15/2019   GRAMSTAIN  08/15/2019    RARE WBC PRESENT, PREDOMINANTLY PMN ABUNDANT GRAM POSITIVE RODS FEW GRAM POSITIVE COCCI RARE GRAM VARIABLE ROD    CULT  08/15/2019    ABUNDANT CORYNEBACTERIUM STRIATUM ABUNDANT HAEMOPHILUS PARAINFLUENZAE BETA LACTAMASE NEGATIVE NO ANAEROBES ISOLATED Performed at [facility], [address], [place], [place] [address]    LABORGA ESCHERICHIA COLI (A) 11/07/2015     Lab Results  Component Value Date   ALBUMIN 2.2 (L) 12/24/2019   ALBUMIN 3.1 (L) 07/21/2019   ALBUMIN 3.2 (L) 11/07/2015   LABURIC 2.5 09/11/2013    No results found for: MG No results found for: VD25OH  No results found for: PREALBUMIN CBC EXTENDED Latest Ref Rng & Units 12/24/2019 12/21/2019 12/20/2019  WBC 4.0 - 10.5 K/uL 8.8 10.5 -  RBC 3.87 - 5.11 MIL/uL 3.54(L) 3.10(L) -  HGB 12.0 - 15.0 g/dL 0.2(D) 7.4(J) 2.8(N)  HCT 36 - 46 % 31.5(L) 27.6(L) 26.8(L)  PLT 150 - 400 K/uL 466(H) 263 -  NEUTROABS 1.7 - 7.7 K/uL 5.9 - -  LYMPHSABS 0.7 - 4.0 K/uL 1.5 - -     There is no height or weight on file to calculate BMI.  Orders:  No orders of the defined types were placed in this encounter.  Meds ordered this encounter  Medications  . methocarbamol (ROBAXIN) 500 MG tablet  Sig: Take 1 tablet (500 mg total) by mouth every 8 (eight) hours as needed for muscle spasms.    Dispense:  30 tablet    Refill:  3     Procedures: No procedures performed  Clinical Data: No additional findings.  ROS:  All other systems negative, except as noted in the HPI. Review of Systems  Objective: Vital Signs: There were no vitals taken for this visit.  Specialty Comments:  No specialty comments available.  PMFS History: Patient Active Problem List   Diagnosis Date Noted  . Steroid-induced hyperglycemia   . Controlled type 1 diabetes mellitus with hyperglycemia (HCC)   . Essential hypertension   . Sinus tachycardia   . Postoperative pain     . S/P bilateral below knee amputation (HCC) 12/23/2019  . Below-knee amputation (HCC) 12/19/2019  . Gangrene of both feet (HCC)   . Heel ulcer, right, with necrosis of muscle (HCC)   . Chronic diastolic heart failure (HCC) 09/12/2013  . Rheumatoid arthritis (HCC) 09/12/2013  . IDDM (insulin dependent diabetes mellitus) 09/09/2013  . Cellulitis of right lower extremity 09/08/2013   Past Medical History:  Diagnosis Date  . Arthritis, rheumatoid (HCC)   . CHF (congestive heart failure) (HCC)    pt denies this dx  . Coronary artery disease   . Diabetes mellitus without complication (HCC)   . Headache   . Hyperlipidemia   . Hypertension   . Latent tuberculosis    pt denies this dx  . Peripheral vascular disease (HCC)    blood clot in left leg  . Type 1 diabetes (HCC)     History reviewed. No pertinent family history.  Past Surgical History:  Procedure Laterality Date  . AMPUTATION Bilateral 12/19/2019   Procedure: BILATERAL BELOW KNEE AMPUTATION;  Surgeon: Nadara Mustard, MD;  Location: [facility] OR;  Service: Orthopedics;  Laterality: Bilateral;  . CARDIAC CATHETERIZATION  11/02/2019  . CARPAL TUNNEL RELEASE Bilateral   . EYE SURGERY Bilateral    09/2018  . FINGER SURGERY  07/11/2014   right middle finger - nodule removed  . FINGER SURGERY  02/20/2017   right middle finger  . FOOT SURGERY Right 02/26/2014   screw  . I & D EXTREMITY Right 08/15/2019   Procedure: RIGHT ACHILLES DEBRIDEMENT, POSSIBLE SKIN GRAFT;  Surgeon: Nadara Mustard, MD;  Location: [facility] OR;  Service: Orthopedics;  Laterality: Right;  . JOINT REPLACEMENT    . SHOULDER SURGERY Right 01/06/2005  . TRIGGER FINGER RELEASE  12/202002   left hand -   . WRIST SURGERY Left 10/23/2004   and middle finger  . WRIST SURGERY Right 11/05/2005  . WRIST SURGERY Right 03/11/2006   cyst removed   Social History   Occupational History  . Not on file  Tobacco Use  . Smoking status: Never Smoker  . Smokeless tobacco: Never Used   Vaping Use  . Vaping Use: Never used  Substance and Sexual Activity  . Alcohol use: No  . Drug use: No  . Sexual activity: Not on file

## 2020-02-05 NOTE — Telephone Encounter (Signed)
Kaitlin Hudson from [facility] called in to advise that patient is doing very well with her home health therapy and wants to be discharged today . Kaitlin Hudson from [facility] agrees it is appropriate to be discharged today.

## 2020-02-11 ENCOUNTER — Ambulatory Visit (INDEPENDENT_AMBULATORY_CARE_PROVIDER_SITE_OTHER): Payer: Medicare Other | Admitting: Physician Assistant

## 2020-02-11 ENCOUNTER — Encounter: Payer: Self-pay | Admitting: Orthopedic Surgery

## 2020-02-11 VITALS — Wt 153.0 lb

## 2020-02-11 DIAGNOSIS — Z89512 Acquired absence of left leg below knee: Secondary | ICD-10-CM

## 2020-02-11 DIAGNOSIS — Z89511 Acquired absence of right leg below knee: Secondary | ICD-10-CM

## 2020-02-11 DIAGNOSIS — S88119A Complete traumatic amputation at level between knee and ankle, unspecified lower leg, initial encounter: Secondary | ICD-10-CM

## 2020-02-11 MED ORDER — DOXYCYCLINE HYCLATE 100 MG PO TABS: 100.0000 mg | ORAL_TABLET | Freq: Two times a day (BID) | ORAL | 0 refills | Status: DC

## 2020-02-11 NOTE — Progress Notes (Signed)
Office Visit Note   Patient: Kaitlin Hudson           Date of Birth: 11-07-1955           MRN: [id number] Visit Date: 02/11/2020              Requested by: No referring provider defined for this encounter. PCP: Earley Abide (Inactive)  Chief Complaint  Patient presents with   Right Leg - Routine Post Op    12/19/19 bilateral BKA    Left Leg - Routine Post Op      HPI: The patient is almost 2 months status post bilateral below-knee amputations.  She is wearing her shrinkers all the time and trying to elevate her her legs.  She is concerned because she seems to have some dehiscence of the wound and has had continued swelling in the left leg.  Of note she is on prednisone and is trying to work with her rheumatologist who she is seeing today as she knows this is slowing her healing  Assessment & Plan: Visit Diagnoses: No diagnosis found.  Plan: I will place her on some doxycycline follow-up next week when she already has a an appointment  Follow-Up Instructions: No follow-ups on file.   Ortho Exam  Patient is alert, oriented, no adenopathy, well-dressed, normal affect, normal respiratory effort. Bilateral below-knee amputations on the left side she has more swelling but no cellulitis.  She does have wound dehiscence with quite a bit of fibrinous tissue.  She does not tolerate me debriding this.  No foul odor.  She does have spots of healthy vascular fibrinous tissue on the right she also has dehiscence around the lateral side and posteriorly.  The swelling is not as much no cellulitis  Imaging: No results found.    Labs: Lab Results  Component Value Date   HGBA1C 6.3 (H) 12/19/2019   HGBA1C 7.2 (H) 08/15/2019   LABURIC 2.5 09/11/2013   REPTSTATUS 08/20/2019 FINAL 08/15/2019   GRAMSTAIN  08/15/2019    RARE WBC PRESENT, PREDOMINANTLY PMN ABUNDANT GRAM POSITIVE RODS FEW GRAM POSITIVE COCCI RARE GRAM VARIABLE ROD    CULT  08/15/2019    ABUNDANT CORYNEBACTERIUM  STRIATUM ABUNDANT HAEMOPHILUS PARAINFLUENZAE BETA LACTAMASE NEGATIVE NO ANAEROBES ISOLATED Performed at [facility], [address], [place], [place] [address]    LABORGA ESCHERICHIA COLI (A) 11/07/2015     Lab Results  Component Value Date   ALBUMIN 2.2 (L) 12/24/2019   ALBUMIN 3.1 (L) 07/21/2019   ALBUMIN 3.2 (L) 11/07/2015   LABURIC 2.5 09/11/2013    No results found for: MG No results found for: VD25OH  No results found for: PREALBUMIN CBC EXTENDED Latest Ref Rng & Units 12/24/2019 12/21/2019 12/20/2019  WBC 4.0 - 10.5 K/uL 8.8 10.5 -  RBC 3.87 - 5.11 MIL/uL 3.54(L) 3.10(L) -  HGB 12.0 - 15.0 g/dL 3.7(S) 2.8(B) 1.5(V)  HCT 36 - 46 % 31.5(L) 27.6(L) 26.8(L)  PLT 150 - 400 K/uL 466(H) 263 -  NEUTROABS 1.7 - 7.7 K/uL 5.9 - -  LYMPHSABS 0.7 - 4.0 K/uL 1.5 - -     Body mass index is 41.36 kg/m.  Orders:  No orders of the defined types were placed in this encounter.  Meds ordered this encounter  Medications   doxycycline (VIBRA-TABS) 100 MG tablet    Sig: Take 1 tablet (100 mg total) by mouth 2 (two) times daily.    Dispense:  30 tablet    Refill:  0  Procedures: No procedures performed  Clinical Data: No additional findings.  ROS:  All other systems negative, except as noted in the HPI. Review of Systems  Objective: Vital Signs: Wt 153 lb (69.4 kg)    BMI 41.36 kg/m   Specialty Comments:  No specialty comments available.  PMFS History: Patient Active Problem List   Diagnosis Date Noted   Steroid-induced hyperglycemia    Controlled type 1 diabetes mellitus with hyperglycemia (HCC)    Essential hypertension    Sinus tachycardia    Postoperative pain    S/P bilateral below knee amputation (HCC) 12/23/2019   Below-knee amputation (HCC) 12/19/2019   Gangrene of both feet (HCC)    Heel ulcer, right, with necrosis of muscle (HCC)    Chronic diastolic heart failure (HCC) 09/12/2013   Rheumatoid arthritis (HCC) 09/12/2013    IDDM (insulin dependent diabetes mellitus) 09/09/2013   Cellulitis of right lower extremity 09/08/2013   Past Medical History:  Diagnosis Date   Arthritis, rheumatoid (HCC)    CHF (congestive heart failure) (HCC)    pt denies this dx   Coronary artery disease    Diabetes mellitus without complication (HCC)    Headache    Hyperlipidemia    Hypertension    Latent tuberculosis    pt denies this dx   Peripheral vascular disease (HCC)    blood clot in left leg   Type 1 diabetes (HCC)     No family history on file.  Past Surgical History:  Procedure Laterality Date   AMPUTATION Bilateral 12/19/2019   Procedure: BILATERAL BELOW KNEE AMPUTATION;  Surgeon: Nadara Mustard, MD;  Location[facility] OR;  Service: Orthopedics;  Laterality: Bilateral;   CARDIAC CATHETERIZATION  11/02/2019   CARPAL TUNNEL RELEASE Bilateral    EYE SURGERY Bilateral    09/2018   FINGER SURGERY  07/11/2014   right middle finger - nodule removed   FINGER SURGERY  02/20/2017   right middle finger   FOOT SURGERY Right 02/26/2014   screw   I & D EXTREMITY Right 08/15/2019   Procedure: RIGHT ACHILLES DEBRIDEMENT, POSSIBLE SKIN GRAFT;  Surgeon: Nadara Mustard, MD;  Location: [facility] OR;  Service: Orthopedics;  Laterality: Right;   JOINT REPLACEMENT     SHOULDER SURGERY Right 01/06/2005   TRIGGER FINGER RELEASE  12/202002   left hand -    WRIST SURGERY Left 10/23/2004   and middle finger   WRIST SURGERY Right 11/05/2005   WRIST SURGERY Right 03/11/2006   cyst removed   Social History   Occupational History   Not on file  Tobacco Use   Smoking status: Never Smoker   Smokeless tobacco: Never Used  Vaping Use   Vaping Use: Never used  Substance and Sexual Activity   Alcohol use: No   Drug use: No   Sexual activity: Not on file

## 2020-02-19 ENCOUNTER — Ambulatory Visit (INDEPENDENT_AMBULATORY_CARE_PROVIDER_SITE_OTHER): Payer: Medicare Other | Admitting: Orthopedic Surgery

## 2020-02-19 ENCOUNTER — Encounter: Payer: Self-pay | Admitting: Orthopedic Surgery

## 2020-02-19 VITALS — Ht <= 58 in | Wt 153.0 lb

## 2020-02-19 DIAGNOSIS — I87333 Chronic venous hypertension (idiopathic) with ulcer and inflammation of bilateral lower extremity: Secondary | ICD-10-CM

## 2020-02-19 DIAGNOSIS — L97919 Non-pressure chronic ulcer of unspecified part of right lower leg with unspecified severity: Secondary | ICD-10-CM

## 2020-02-19 DIAGNOSIS — Z89511 Acquired absence of right leg below knee: Secondary | ICD-10-CM

## 2020-02-19 DIAGNOSIS — L97929 Non-pressure chronic ulcer of unspecified part of left lower leg with unspecified severity: Secondary | ICD-10-CM

## 2020-02-19 DIAGNOSIS — S88119A Complete traumatic amputation at level between knee and ankle, unspecified lower leg, initial encounter: Secondary | ICD-10-CM

## 2020-02-25 ENCOUNTER — Encounter: Payer: Self-pay | Admitting: Orthopedic Surgery

## 2020-02-25 NOTE — Progress Notes (Signed)
Office Visit Note   Patient: Kaitlin Hudson           Date of Birth: January 22, 1956           MRN: [id number] Visit Date: 02/19/2020              Requested by: No referring provider defined for this encounter. PCP: Earley Abide (Inactive)  Chief Complaint  Patient presents with  . Right Leg - Routine Post Op    12/19/19 bilateral below the knee amputations   . Left Leg - Routine Post Op      HPI: Patient is a 64 year old woman who is status post bilateral transtibial amputation she is wearing her stump shrinker she states she injured her right knee hitting it on a desk.  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  She is currently on doxycycline.  Assessment & Plan: Visit Diagnoses:  1. 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 Below knee amputation [facility])     Plan: Patient was given a note that she could return to work on December 31.  Follow-Up Instructions: Return in about 4 weeks (around 03/18/2020).   Ortho Exam  Patient is alert, oriented, no adenopathy, well-dressed, normal affect, normal respiratory effort. Examination there is no cellulitis there is improvement in the granulation tissue the sutures were removed fibrinous tissue was removed she does have venous and lymphatic swelling.  Imaging: No results found. No images are attached to the encounter.  Labs: Lab Results  Component Value Date   HGBA1C 6.3 (H) 12/19/2019   HGBA1C 7.2 (H) 08/15/2019   LABURIC 2.5 09/11/2013   REPTSTATUS 08/20/2019 FINAL 08/15/2019   GRAMSTAIN  08/15/2019    RARE WBC PRESENT, PREDOMINANTLY PMN ABUNDANT GRAM POSITIVE RODS FEW GRAM POSITIVE COCCI RARE GRAM VARIABLE ROD    CULT  08/15/2019    ABUNDANT CORYNEBACTERIUM STRIATUM ABUNDANT HAEMOPHILUS PARAINFLUENZAE BETA LACTAMASE NEGATIVE NO ANAEROBES ISOLATED Performed at [facility], [address] [place], [place] [address]     LABORGA ESCHERICHIA COLI (A) 11/07/2015     Lab Results  Component Value Date   ALBUMIN 2.2 (L) 12/24/2019   ALBUMIN 3.1 (L) 07/21/2019   ALBUMIN 3.2 (L) 11/07/2015   LABURIC 2.5 09/11/2013    No results found for: MG No results found for: VD25OH  No results found for: PREALBUMIN CBC EXTENDED Latest Ref Rng & Units 12/24/2019 12/21/2019 12/20/2019  WBC 4.0 - 10.5 K/uL 8.8 10.5 -  RBC 3.87 - 5.11 MIL/uL 3.54(L) 3.10(L) -  HGB 12.0 - 15.0 g/dL 8.3(T) 5.1(V) 6.1(Y)  HCT 36.0 - 46.0 % 31.5(L) 27.6(L) 26.8(L)  PLT 150 - 400 K/uL 466(H) 263 -  NEUTROABS 1.7 - 7.7 K/uL 5.9 - -  LYMPHSABS 0.7 - 4.0 K/uL 1.5 - -     Body mass index is 41.36 kg/m.  Orders:  No orders of the defined types were placed in this encounter.  No orders of the defined types were placed in this encounter.    Procedures: No procedures performed  Clinical Data: No additional findings.  ROS:  All other systems negative, except as noted in the HPI. Review of Systems  Objective: Vital Signs: Ht 4\' 3"  (1.295 m)   Wt 153 lb (69.4 kg)   BMI 41.36 kg/m   Specialty Comments:  No specialty comments available.  PMFS History: Patient Active Problem List   Diagnosis Date Noted  . Steroid-induced hyperglycemia   .  Controlled type 1 diabetes mellitus with hyperglycemia (HCC)   . Essential hypertension   . Sinus tachycardia   . Postoperative pain   . S/P bilateral below knee amputation (HCC) 12/23/2019  . Below-knee amputation (HCC) 12/19/2019  . Gangrene of both feet (HCC)   . Heel ulcer, right, with necrosis of muscle (HCC)   . Chronic diastolic heart failure (HCC) 09/12/2013  . Rheumatoid arthritis (HCC) 09/12/2013  . IDDM (insulin dependent diabetes mellitus) 09/09/2013  . Cellulitis of right lower extremity 09/08/2013   Past Medical History:  Diagnosis Date  . Arthritis, rheumatoid (HCC)   . CHF (congestive heart failure) (HCC)    pt denies this dx  . Coronary artery disease   . Diabetes  mellitus without complication (HCC)   . Headache   . Hyperlipidemia   . Hypertension   . Latent tuberculosis    pt denies this dx  . Peripheral vascular disease (HCC)    blood clot in left leg  . Type 1 diabetes (HCC)     History reviewed. No pertinent family history.  Past Surgical History:  Procedure Laterality Date  . AMPUTATION Bilateral 12/19/2019   Procedure: BILATERAL BELOW KNEE AMPUTATION;  Surgeon: Nadara Mustard, MD;  Location: [facility] OR;  Service: Orthopedics;  Laterality: Bilateral;  . CARDIAC CATHETERIZATION  11/02/2019  . CARPAL TUNNEL RELEASE Bilateral   . EYE SURGERY Bilateral    09/2018  . FINGER SURGERY  07/11/2014   right middle finger - nodule removed  . FINGER SURGERY  02/20/2017   right middle finger  . FOOT SURGERY Right 02/26/2014   screw  . I & D EXTREMITY Right 08/15/2019   Procedure: RIGHT ACHILLES DEBRIDEMENT, POSSIBLE SKIN GRAFT;  Surgeon: Nadara Mustard, MD;  Location: [facility] OR;  Service: Orthopedics;  Laterality: Right;  . JOINT REPLACEMENT    . SHOULDER SURGERY Right 01/06/2005  . TRIGGER FINGER RELEASE  12/202002   left hand -   . WRIST SURGERY Left 10/23/2004   and middle finger  . WRIST SURGERY Right 11/05/2005  . WRIST SURGERY Right 03/11/2006   cyst removed   Social History   Occupational History  . Not on file  Tobacco Use  . Smoking status: Never Smoker  . Smokeless tobacco: Never Used  Vaping Use  . Vaping Use: Never used  Substance and Sexual Activity  . Alcohol use: No  . Drug use: No  . Sexual activity: Not on file

## 2020-03-06 ENCOUNTER — Ambulatory Visit (INDEPENDENT_AMBULATORY_CARE_PROVIDER_SITE_OTHER): Payer: Medicare Other | Admitting: Orthopaedic Surgery

## 2020-03-06 ENCOUNTER — Other Ambulatory Visit: Payer: Self-pay

## 2020-03-06 DIAGNOSIS — Z89511 Acquired absence of right leg below knee: Secondary | ICD-10-CM

## 2020-03-06 DIAGNOSIS — Z89512 Acquired absence of left leg below knee: Secondary | ICD-10-CM

## 2020-03-06 MED ORDER — SANTYL 250 UNIT/GM EX OINT: 1.0000 "application " | TOPICAL_OINTMENT | Freq: Every day | CUTANEOUS | 3 refills | Status: DC

## 2020-03-06 MED ORDER — DOXYCYCLINE HYCLATE 100 MG PO TABS: 100.0000 mg | ORAL_TABLET | Freq: Two times a day (BID) | ORAL | 0 refills | Status: DC

## 2020-03-06 NOTE — Progress Notes (Signed)
Post-Op Visit Note   Patient: Kaitlin Hudson           Date of Birth: Dec 26, 1955           MRN: [id number] Visit Date: 03/06/2020 PCP: Earley Abide. (Inactive)   Assessment & Plan:  Chief Complaint:  Chief Complaint  Patient presents with  . Left Leg - Wound Check  . Right Leg - Wound Check   Visit Diagnoses:  1. S/P bilateral below knee amputation [facility])     Plan:   [place] is status post bilateral BKA by Dr. Lajoyce Corners on 12/19/2019. Last night she started noticing drainage from the stump sites. She had one episode of chills. Denies any fevers. She has had these open wounds for some time. They have been treating this with dry dressings and stump shrinker's.  Physical exam shows fibrinous exudative tissue in the wounds. There is serous drainage. There is some local erythema and cellulitis. Moderate swelling. No frank pus.  We will place her on doxycycline to take twice a day. Prescription for Santyl ointment twice a day with dry dressing and compression wrap. Patient will follow-up as scheduled with Dr. Lajoyce Corners on January 4. Questions encouraged and answered.  Follow-Up Instructions: Return for as scheduled with on Jan 4th with Lajoyce Corners.   Orders:  No orders of the defined types were placed in this encounter.  Meds ordered this encounter  Medications  . doxycycline (VIBRA-TABS) 100 MG tablet    Sig: Take 1 tablet (100 mg total) by mouth 2 (two) times daily.    Dispense:  20 tablet    Refill:  0  . collagenase (SANTYL) ointment    Sig: Apply 1 application topically daily.    Dispense:  30 g    Refill:  3    Imaging: No results found.  PMFS History: Patient Active Problem List   Diagnosis Date Noted  . Steroid-induced hyperglycemia   . Controlled type 1 diabetes mellitus with hyperglycemia (HCC)   . Essential hypertension   . Sinus tachycardia   . Postoperative pain   . S/P bilateral below knee amputation (HCC) 12/23/2019  . Below-knee amputation (HCC) 12/19/2019  .  Gangrene of both feet (HCC)   . Heel ulcer, right, with necrosis of muscle (HCC)   . Chronic diastolic heart failure (HCC) 09/12/2013  . Rheumatoid arthritis (HCC) 09/12/2013  . IDDM (insulin dependent diabetes mellitus) 09/09/2013  . Cellulitis of right lower extremity 09/08/2013   Past Medical History:  Diagnosis Date  . Arthritis, rheumatoid (HCC)   . CHF (congestive heart failure) (HCC)    pt denies this dx  . Coronary artery disease   . Diabetes mellitus without complication (HCC)   . Headache   . Hyperlipidemia   . Hypertension   . Latent tuberculosis    pt denies this dx  . Peripheral vascular disease (HCC)    blood clot in left leg  . Type 1 diabetes (HCC)     No family history on file.  Past Surgical History:  Procedure Laterality Date  . AMPUTATION Bilateral 12/19/2019   Procedure: BILATERAL BELOW KNEE AMPUTATION;  Surgeon: Nadara Mustard, MD;  Location: [facility] OR;  Service: Orthopedics;  Laterality: Bilateral;  . CARDIAC CATHETERIZATION  11/02/2019  . CARPAL TUNNEL RELEASE Bilateral   . EYE SURGERY Bilateral    09/2018  . FINGER SURGERY  07/11/2014   right middle finger - nodule removed  . FINGER SURGERY  02/20/2017   right middle finger  . FOOT  SURGERY Right 02/26/2014   screw  . I & D EXTREMITY Right 08/15/2019   Procedure: RIGHT ACHILLES DEBRIDEMENT, POSSIBLE SKIN GRAFT;  Surgeon: Nadara Mustard, MD;  Location: [facility] OR;  Service: Orthopedics;  Laterality: Right;  . JOINT REPLACEMENT    . SHOULDER SURGERY Right 01/06/2005  . TRIGGER FINGER RELEASE  12/202002   left hand -   . WRIST SURGERY Left 10/23/2004   and middle finger  . WRIST SURGERY Right 11/05/2005  . WRIST SURGERY Right 03/11/2006   cyst removed   Social History   Occupational History  . Not on file  Tobacco Use  . Smoking status: Never Smoker  . Smokeless tobacco: Never Used  Vaping Use  . Vaping Use: Never used  Substance and Sexual Activity  . Alcohol use: No  . Drug use: No  . Sexual  activity: Not on file

## 2020-03-12 ENCOUNTER — Encounter: Payer: Self-pay | Admitting: Orthopedic Surgery

## 2020-03-12 ENCOUNTER — Ambulatory Visit (INDEPENDENT_AMBULATORY_CARE_PROVIDER_SITE_OTHER): Payer: Medicare Other | Admitting: Orthopedic Surgery

## 2020-03-12 VITALS — Ht <= 58 in | Wt 153.0 lb

## 2020-03-12 DIAGNOSIS — Z89512 Acquired absence of left leg below knee: Secondary | ICD-10-CM

## 2020-03-12 DIAGNOSIS — Z89511 Acquired absence of right leg below knee: Secondary | ICD-10-CM

## 2020-03-12 NOTE — Progress Notes (Signed)
Office Visit Note   Patient: Kaitlin Hudson           Date of Birth: Jan 19, 1956           MRN: [id number] Visit Date: 03/12/2020              Requested by: No referring provider defined for this encounter. PCP: Earley Abide (Inactive)  Chief Complaint  Patient presents with  . Left Leg - Routine Post Op    12/19/19 bilateral BKA   . Right Leg - Routine Post Op      HPI: Patient is a 64 year old woman status post bilateral transtibial amputation she is currently on doxycycline and using Santyl for the ischemic incisions.  Patient states the drainage has decreased.  Assessment & Plan: Visit Diagnoses:  1. S/P bilateral below knee amputation (HCC)     Plan: Patient is showing excellent improvement in the granulation tissue continue with current care with Santyl dressing changes daily recommended using the stump shrinker on top of the gauze to help decrease the edema.  Discussed that as the drainage resolves she can use the stump shrinker directly against the wound that should help wound healing.  Follow-Up Instructions: Return in about 3 weeks (around 04/02/2020).   Ortho Exam  Patient is alert, oriented, no adenopathy, well-dressed, normal affect, normal respiratory effort. Examination patient still has pitting edema both lower extremities she has significant improvement in the granulation tissue there is no depth to the wounds there is some epiboly of the wound edges and there is decreased fascial breakdown.  There is no cellulitis no odor no signs of infection.  Im infection aging: No results found. No images are attached to the encounter.  Labs: Lab Results  Component Value Date   HGBA1C 6.3 (H) 12/19/2019   HGBA1C 7.2 (H) 08/15/2019   LABURIC 2.5 09/11/2013   REPTSTATUS 08/20/2019 FINAL 08/15/2019   GRAMSTAIN  08/15/2019    RARE WBC PRESENT, PREDOMINANTLY PMN ABUNDANT GRAM POSITIVE RODS FEW GRAM POSITIVE COCCI RARE GRAM VARIABLE ROD    CULT  08/15/2019     ABUNDANT CORYNEBACTERIUM STRIATUM ABUNDANT HAEMOPHILUS PARAINFLUENZAE BETA LACTAMASE NEGATIVE NO ANAEROBES ISOLATED Performed at [facility], [address], [place], [place] [address]    LABORGA ESCHERICHIA COLI (A) 11/07/2015     Lab Results  Component Value Date   ALBUMIN 2.2 (L) 12/24/2019   ALBUMIN 3.1 (L) 07/21/2019   ALBUMIN 3.2 (L) 11/07/2015   LABURIC 2.5 09/11/2013    No results found for: MG No results found for: VD25OH  No results found for: PREALBUMIN CBC EXTENDED Latest Ref Rng & Units 12/24/2019 12/21/2019 12/20/2019  WBC 4.0 - 10.5 K/uL 8.8 10.5 -  RBC 3.87 - 5.11 MIL/uL 3.54(L) 3.10(L) -  HGB 12.0 - 15.0 g/dL 0.5(W) 9.7(X) 4.8(A)  HCT 36.0 - 46.0 % 31.5(L) 27.6(L) 26.8(L)  PLT 150 - 400 K/uL 466(H) 263 -  NEUTROABS 1.7 - 7.7 K/uL 5.9 - -  LYMPHSABS 0.7 - 4.0 K/uL 1.5 - -     Body mass index is 41.36 kg/m.  Orders:  No orders of the defined types were placed in this encounter.  No orders of the defined types were placed in this encounter.    Procedures: No procedures performed  Clinical Data: No additional findings.  ROS:  All other systems negative, except as noted in the HPI. Review of Systems  Objective: Vital Signs: Ht 4\' 3"  (1.295 m)   Wt 153 lb (69.4 kg)  BMI 41.36 kg/m   Specialty Comments:  No specialty comments available.  PMFS History: Patient Active Problem List   Diagnosis Date Noted  . Steroid-induced hyperglycemia   . Controlled type 1 diabetes mellitus with hyperglycemia (HCC)   . Essential hypertension   . Sinus tachycardia   . Postoperative pain   . S/P bilateral below knee amputation (HCC) 12/23/2019  . Below-knee amputation (HCC) 12/19/2019  . Gangrene of both feet (HCC)   . Heel ulcer, right, with necrosis of muscle (HCC)   . Chronic diastolic heart failure (HCC) 09/12/2013  . Rheumatoid arthritis (HCC) 09/12/2013  . IDDM (insulin dependent diabetes mellitus) 09/09/2013  . Cellulitis of right lower  extremity 09/08/2013   Past Medical History:  Diagnosis Date  . Arthritis, rheumatoid (HCC)   . CHF (congestive heart failure) (HCC)    pt denies this dx  . Coronary artery disease   . Diabetes mellitus without complication (HCC)   . Headache   . Hyperlipidemia   . Hypertension   . Latent tuberculosis    pt denies this dx  . Peripheral vascular disease (HCC)    blood clot in left leg  . Type 1 diabetes (HCC)     History reviewed. No pertinent family history.  Past Surgical History:  Procedure Laterality Date  . AMPUTATION Bilateral 12/19/2019   Procedure: BILATERAL BELOW KNEE AMPUTATION;  Surgeon: Nadara Mustard, MD;  Location: [facility] OR;  Service: Orthopedics;  Laterality: Bilateral;  . CARDIAC CATHETERIZATION  11/02/2019  . CARPAL TUNNEL RELEASE Bilateral   . EYE SURGERY Bilateral    09/2018  . FINGER SURGERY  07/11/2014   right middle finger - nodule removed  . FINGER SURGERY  02/20/2017   right middle finger  . FOOT SURGERY Right 02/26/2014   screw  . I & D EXTREMITY Right 08/15/2019   Procedure: RIGHT ACHILLES DEBRIDEMENT, POSSIBLE SKIN GRAFT;  Surgeon: Nadara Mustard, MD;  Location: [facility] OR;  Service: Orthopedics;  Laterality: Right;  . JOINT REPLACEMENT    . SHOULDER SURGERY Right 01/06/2005  . TRIGGER FINGER RELEASE  12/202002   left hand -   . WRIST SURGERY Left 10/23/2004   and middle finger  . WRIST SURGERY Right 11/05/2005  . WRIST SURGERY Right 03/11/2006   cyst removed   Social History   Occupational History  . Not on file  Tobacco Use  . Smoking status: Never Smoker  . Smokeless tobacco: Never Used  Vaping Use  . Vaping Use: Never used  Substance and Sexual Activity  . Alcohol use: No  . Drug use: No  . Sexual activity: Not on file

## 2020-03-18 ENCOUNTER — Ambulatory Visit: Payer: Medicare Other | Admitting: Orthopedic Surgery

## 2020-03-25 ENCOUNTER — Encounter: Payer: Self-pay | Admitting: Orthopedic Surgery

## 2020-03-25 ENCOUNTER — Ambulatory Visit (INDEPENDENT_AMBULATORY_CARE_PROVIDER_SITE_OTHER): Payer: Medicare Other | Admitting: Orthopedic Surgery

## 2020-03-25 DIAGNOSIS — Z89511 Acquired absence of right leg below knee: Secondary | ICD-10-CM

## 2020-03-25 DIAGNOSIS — Z89512 Acquired absence of left leg below knee: Secondary | ICD-10-CM

## 2020-03-25 NOTE — Progress Notes (Signed)
Office Visit Note   Patient: Kaitlin Hudson           Date of Birth: 30-Sep-1955           MRN: [id number] Visit Date: 03/25/2020              Requested by: No referring provider defined for this encounter. PCP: Earley Abide (Inactive)  Chief Complaint  Patient presents with  . Right Knee - Routine Post Op  . Left Knee - Routine Post Op      HPI: Patient is 3 months status post bilateral below-knee amputations.  She has been struggling with slow healing especially on the left.  They have been doing Santyl dressing changes.  Her husband thinks she has far more viable tissue though she still has significant serous drainage.  She is unable to tolerate most protein drinks and cannot tolerate elevating her legs  Assessment & Plan: Visit Diagnoses: No diagnosis found.  Plan: She has improved should continue trying to move forward towards having the shrinkers against the skin.  We will follow-up in 2 weeks.  Continue Santyl dressing changes again importance of protein and elevating legs emphasized  Follow-Up Instructions: No follow-ups on file.   Ortho Exam  Patient is alert, oriented, no adenopathy, well-dressed, normal affect, normal respiratory effort. Bilateral lower extremities moderate soft tissue swelling but no cellulitis no erythema no foul odor she has clear serous drainage left greater than right she does she does have a significant increase from last visit of viable healthy granulation tissue in the dehiscence areas nothing probes deeply  Imaging: No results found. No images are attached to the encounter.  Labs: Lab Results  Component Value Date   HGBA1C 6.3 (H) 12/19/2019   HGBA1C 7.2 (H) 08/15/2019   LABURIC 2.5 09/11/2013   REPTSTATUS 08/20/2019 FINAL 08/15/2019   GRAMSTAIN  08/15/2019    RARE WBC PRESENT, PREDOMINANTLY PMN ABUNDANT GRAM POSITIVE RODS FEW GRAM POSITIVE COCCI RARE GRAM VARIABLE ROD    CULT  08/15/2019    ABUNDANT CORYNEBACTERIUM  STRIATUM ABUNDANT HAEMOPHILUS PARAINFLUENZAE BETA LACTAMASE NEGATIVE NO ANAEROBES ISOLATED Performed at [facility], [address], [place], [place] [address]    LABORGA ESCHERICHIA COLI (A) 11/07/2015     Lab Results  Component Value Date   ALBUMIN 2.2 (L) 12/24/2019   ALBUMIN 3.1 (L) 07/21/2019   ALBUMIN 3.2 (L) 11/07/2015   LABURIC 2.5 09/11/2013    No results found for: MG No results found for: VD25OH  No results found for: PREALBUMIN CBC EXTENDED Latest Ref Rng & Units 12/24/2019 12/21/2019 12/20/2019  WBC 4.0 - 10.5 K/uL 8.8 10.5 -  RBC 3.87 - 5.11 MIL/uL 3.54(L) 3.10(L) -  HGB 12.0 - 15.0 g/dL 2.7(P) 8.2(U) 2.3(N)  HCT 36.0 - 46.0 % 31.5(L) 27.6(L) 26.8(L)  PLT 150 - 400 K/uL 466(H) 263 -  NEUTROABS 1.7 - 7.7 K/uL 5.9 - -  LYMPHSABS 0.7 - 4.0 K/uL 1.5 - -     There is no height or weight on file to calculate BMI.  Orders:  No orders of the defined types were placed in this encounter.  No orders of the defined types were placed in this encounter.    Procedures: No procedures performed  Clinical Data: No additional findings.  ROS:  All other systems negative, except as noted in the HPI. Review of Systems  Objective: Vital Signs: There were no vitals taken for this visit.  Specialty Comments:  No specialty comments available.  PMFS History: Patient Active Problem List   Diagnosis Date Noted  . Steroid-induced hyperglycemia   . Controlled type 1 diabetes mellitus with hyperglycemia (HCC)   . Essential hypertension   . Sinus tachycardia   . Postoperative pain   . S/P bilateral below knee amputation (HCC) 12/23/2019  . Below-knee amputation (HCC) 12/19/2019  . Gangrene of both feet (HCC)   . Heel ulcer, right, with necrosis of muscle (HCC)   . Chronic diastolic heart failure (HCC) 09/12/2013  . Rheumatoid arthritis (HCC) 09/12/2013  . IDDM (insulin dependent diabetes mellitus) 09/09/2013  . Cellulitis of right lower extremity 09/08/2013    Past Medical History:  Diagnosis Date  . Arthritis, rheumatoid (HCC)   . CHF (congestive heart failure) (HCC)    pt denies this dx  . Coronary artery disease   . Diabetes mellitus without complication (HCC)   . Headache   . Hyperlipidemia   . Hypertension   . Latent tuberculosis    pt denies this dx  . Peripheral vascular disease (HCC)    blood clot in left leg  . Type 1 diabetes (HCC)     No family history on file.  Past Surgical History:  Procedure Laterality Date  . AMPUTATION Bilateral 12/19/2019   Procedure: BILATERAL BELOW KNEE AMPUTATION;  Surgeon: Nadara Mustard, MD;  Location: [facility] OR;  Service: Orthopedics;  Laterality: Bilateral;  . CARDIAC CATHETERIZATION  11/02/2019  . CARPAL TUNNEL RELEASE Bilateral   . EYE SURGERY Bilateral    09/2018  . FINGER SURGERY  07/11/2014   right middle finger - nodule removed  . FINGER SURGERY  02/20/2017   right middle finger  . FOOT SURGERY Right 02/26/2014   screw  . I & D EXTREMITY Right 08/15/2019   Procedure: RIGHT ACHILLES DEBRIDEMENT, POSSIBLE SKIN GRAFT;  Surgeon: Nadara Mustard, MD;  Location: [facility] OR;  Service: Orthopedics;  Laterality: Right;  . JOINT REPLACEMENT    . SHOULDER SURGERY Right 01/06/2005  . TRIGGER FINGER RELEASE  12/202002   left hand -   . WRIST SURGERY Left 10/23/2004   and middle finger  . WRIST SURGERY Right 11/05/2005  . WRIST SURGERY Right 03/11/2006   cyst removed   Social History   Occupational History  . Not on file  Tobacco Use  . Smoking status: Never Smoker  . Smokeless tobacco: Never Used  Vaping Use  . Vaping Use: Never used  Substance and Sexual Activity  . Alcohol use: No  . Drug use: No  . Sexual activity: Not on file

## 2020-04-08 ENCOUNTER — Ambulatory Visit (INDEPENDENT_AMBULATORY_CARE_PROVIDER_SITE_OTHER): Payer: Medicare Other | Admitting: Orthopedic Surgery

## 2020-04-08 DIAGNOSIS — L97919 Non-pressure chronic ulcer of unspecified part of right lower leg with unspecified severity: Secondary | ICD-10-CM

## 2020-04-08 DIAGNOSIS — I87333 Chronic venous hypertension (idiopathic) with ulcer and inflammation of bilateral lower extremity: Secondary | ICD-10-CM

## 2020-04-08 DIAGNOSIS — Z89511 Acquired absence of right leg below knee: Secondary | ICD-10-CM | POA: Diagnosis not present

## 2020-04-08 DIAGNOSIS — L97929 Non-pressure chronic ulcer of unspecified part of left lower leg with unspecified severity: Secondary | ICD-10-CM

## 2020-04-08 DIAGNOSIS — Z89512 Acquired absence of left leg below knee: Secondary | ICD-10-CM

## 2020-04-09 ENCOUNTER — Encounter: Payer: Self-pay | Admitting: Orthopedic Surgery

## 2020-04-09 NOTE — Progress Notes (Signed)
Office Visit Note   Patient: Kaitlin Hudson           Date of Birth: Oct 12, 1955           MRN: [id number] Visit Date: 04/08/2020              Requested by: No referring provider defined for this encounter. PCP: Earley Abide (Inactive)  Chief Complaint  Patient presents with  . Right Leg - Routine Post Op    12/29/2019 bilateral BKA   . Left Leg - Routine Post Op      HPI: Patient is a 65 year old woman who is just over 3 months out from bilateral transtibial amputation she is currently wearing stump shrinkers she still has fibrinous exudative tissue she is currently doing Santyl dressing changes which has helped she still has swelling she is not on antibiotics.  Patient has not been able to tolerate protein supplements.  Assessment & Plan: Visit Diagnoses:  1. S/P bilateral below knee amputation (HCC)   2. 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   Ortho Exam  Patient is alert, oriented, no adenopathy, well-dressed, normal affect, normal respiratory effort. Examination patient has decreased fibrinous exudative tissue on both transtibial amputations she still has significant pitting edema in both legs there is no cellulitis there is healthy granulation tissue at the wound bed with clear serous drainage the ulcers do not probe to bone or tendon.  Imaging: No results found. No images are attached to the encounter.  Labs: Lab Results  Component Value Date   HGBA1C 6.3 (H) 12/19/2019   HGBA1C 7.2 (H) 08/15/2019   LABURIC 2.5 09/11/2013   REPTSTATUS 08/20/2019 FINAL 08/15/2019   GRAMSTAIN  08/15/2019    RARE WBC PRESENT, PREDOMINANTLY PMN ABUNDANT GRAM POSITIVE RODS FEW GRAM  POSITIVE COCCI RARE GRAM VARIABLE ROD    CULT  08/15/2019    ABUNDANT CORYNEBACTERIUM STRIATUM ABUNDANT HAEMOPHILUS PARAINFLUENZAE BETA LACTAMASE NEGATIVE NO ANAEROBES ISOLATED Performed at [facility], [address], [place], [place] [address]    LABORGA ESCHERICHIA COLI (A) 11/07/2015     Lab Results  Component Value Date   ALBUMIN 2.2 (L) 12/24/2019   ALBUMIN 3.1 (L) 07/21/2019   ALBUMIN 3.2 (L) 11/07/2015   LABURIC 2.5 09/11/2013    No results found for: MG No results found for: VD25OH  No results found for: PREALBUMIN CBC EXTENDED Latest Ref Rng & Units 12/24/2019 12/21/2019 12/20/2019  WBC 4.0 - 10.5 K/uL 8.8 10.5 -  RBC 3.87 - 5.11 MIL/uL 3.54(L) 3.10(L) -  HGB 12.0 - 15.0 g/dL 4.8(J) 8.5(U) 3.1(S)  HCT 36.0 - 46.0 % 31.5(L) 27.6(L) 26.8(L)  PLT 150 - 400 K/uL 466(H) 263 -  NEUTROABS 1.7 - 7.7 K/uL 5.9 - -  LYMPHSABS 0.7 - 4.0 K/uL 1.5 - -     There is no height or weight on file to calculate BMI.  Orders:  No orders of the defined types were placed in this encounter.  No orders of the defined types were placed in this encounter.    Procedures: No procedures performed  Clinical Data: No additional findings.  ROS:  All other systems negative, except as noted in the HPI. Review of Systems  Objective: Vital Signs: There were no vitals taken for this visit.  Specialty Comments:  No specialty comments available.  PMFS History: Patient Active Problem List   Diagnosis Date Noted  . Steroid-induced hyperglycemia   . Controlled type 1 diabetes mellitus with hyperglycemia (HCC)   . Essential hypertension   . Sinus tachycardia   . Postoperative pain   . S/P bilateral below knee amputation (HCC) 12/23/2019  . Below-knee amputation (HCC) 12/19/2019  . Gangrene of both feet (HCC)   . Heel ulcer, right, with necrosis of muscle (HCC)   . Chronic diastolic heart failure (HCC) 09/12/2013  . Rheumatoid arthritis (HCC) 09/12/2013  . IDDM (insulin  dependent diabetes mellitus) 09/09/2013  . Cellulitis of right lower extremity 09/08/2013   Past Medical History:  Diagnosis Date  . Arthritis, rheumatoid (HCC)   . CHF (congestive heart failure) (HCC)    pt denies this dx  . Coronary artery disease   . Diabetes mellitus without complication (HCC)   . Headache   . Hyperlipidemia   . Hypertension   . Latent tuberculosis    pt denies this dx  . Peripheral vascular disease (HCC)    blood clot in left leg  . Type 1 diabetes (HCC)     History reviewed. No pertinent family history.  Past Surgical History:  Procedure Laterality Date  . AMPUTATION Bilateral 12/19/2019   Procedure: BILATERAL BELOW KNEE AMPUTATION;  Surgeon: Nadara Mustard, MD;  Location: [facility] OR;  Service: Orthopedics;  Laterality: Bilateral;  . CARDIAC CATHETERIZATION  11/02/2019  . CARPAL TUNNEL RELEASE Bilateral   . EYE SURGERY Bilateral    09/2018  . FINGER SURGERY  07/11/2014   right middle finger - nodule removed  . FINGER SURGERY  02/20/2017   right middle finger  . FOOT SURGERY Right 02/26/2014   screw  . I & D EXTREMITY Right 08/15/2019   Procedure: RIGHT ACHILLES DEBRIDEMENT, POSSIBLE SKIN GRAFT;  Surgeon: Nadara Mustard, MD;  Location: [facility] OR;  Service: Orthopedics;  Laterality: Right;  . JOINT REPLACEMENT    . SHOULDER SURGERY Right 01/06/2005  . TRIGGER FINGER RELEASE  12/202002   left hand -   . WRIST SURGERY Left 10/23/2004   and middle finger  . WRIST SURGERY Right 11/05/2005  . WRIST SURGERY Right 03/11/2006   cyst removed   Social History   Occupational History  . Not on file  Tobacco Use  . Smoking status: Never Smoker  . Smokeless tobacco: Never Used  Vaping Use  . Vaping Use: Never used  Substance and Sexual Activity  . Alcohol use: No  . Drug use: No  . Sexual activity: Not on file

## 2020-04-18 ENCOUNTER — Other Ambulatory Visit: Payer: Self-pay | Admitting: Orthopaedic Surgery

## 2020-05-06 ENCOUNTER — Ambulatory Visit (INDEPENDENT_AMBULATORY_CARE_PROVIDER_SITE_OTHER): Payer: Medicare Other | Admitting: Orthopedic Surgery

## 2020-05-06 DIAGNOSIS — Z89511 Acquired absence of right leg below knee: Secondary | ICD-10-CM

## 2020-05-06 DIAGNOSIS — Z89512 Acquired absence of left leg below knee: Secondary | ICD-10-CM

## 2020-05-13 ENCOUNTER — Encounter: Payer: Self-pay | Admitting: Orthopedic Surgery

## 2020-05-13 NOTE — Progress Notes (Signed)
Office Visit Note   Patient: Kaitlin Hudson           Date of Birth: [birth year]           MRN: [id number] Visit Date: 05/06/2020              Requested by: No referring provider defined for this encounter. PCP: Earley Abide (Inactive)  Chief Complaint  Patient presents with  . Right Leg - Follow-up    12/19/19 bilateral BKA   . Left Leg - Follow-up      HPI: Patient is a 65 year old woman who is 4-1/2 months status post bilateral transtibial amputations.  She has had slow wound healing no change in her swelling after using diuretics.  Patient sits with her head elevated legs dependent for sleeping.  Assessment & Plan: Visit Diagnoses:  1. S/P bilateral below knee amputation (HCC)     Plan: We will continue with wound cleansing and dressing changes we will stop the Santyl dressing at this time recommend patient try the 2 extra-large stump shrinker.  Follow-Up Instructions: Return in about 2 weeks (around 05/20/2020).   Ortho Exam  Patient is alert, oriented, no adenopathy, well-dressed, normal affect, normal respiratory effort. Examination the wounds on both legs have 100% healthy granulation tissue she has pitting edema both legs with swelling clear drainage no cellulitis.  Imaging: No results found. No images are attached to the encounter.  Labs: Lab Results  Component Value Date   HGBA1C 6.3 (H) 12/19/2019   HGBA1C 7.2 (H) 08/15/2019   LABURIC 2.5 09/11/2013   REPTSTATUS 08/20/2019 FINAL 08/15/2019   GRAMSTAIN  08/15/2019    RARE WBC PRESENT, PREDOMINANTLY PMN ABUNDANT GRAM POSITIVE RODS FEW GRAM POSITIVE COCCI RARE GRAM VARIABLE ROD    CULT  08/15/2019    ABUNDANT CORYNEBACTERIUM STRIATUM ABUNDANT HAEMOPHILUS PARAINFLUENZAE BETA LACTAMASE NEGATIVE NO ANAEROBES ISOLATED Performed at [facility], [address], [place], [place] [address]    LABORGA ESCHERICHIA COLI (A) 11/07/2015     Lab Results  Component Value Date   ALBUMIN 2.2 (L)  12/24/2019   ALBUMIN 3.1 (L) 07/21/2019   ALBUMIN 3.2 (L) 11/07/2015   LABURIC 2.5 09/11/2013    No results found for: MG No results found for: VD25OH  No results found for: PREALBUMIN CBC EXTENDED Latest Ref Rng & Units 12/24/2019 12/21/2019 12/20/2019  WBC 4.0 - 10.5 K/uL 8.8 10.5 -  RBC 3.87 - 5.11 MIL/uL 3.54(L) 3.10(L) -  HGB 12.0 - 15.0 g/dL 1.9(J) 4.7(W) 2.9(F)  HCT 36.0 - 46.0 % 31.5(L) 27.6(L) 26.8(L)  PLT 150 - 400 K/uL 466(H) 263 -  NEUTROABS 1.7 - 7.7 K/uL 5.9 - -  LYMPHSABS 0.7 - 4.0 K/uL 1.5 - -     There is no height or weight on file to calculate BMI.  Orders:  No orders of the defined types were placed in this encounter.  No orders of the defined types were placed in this encounter.    Procedures: No procedures performed  Clinical Data: No additional findings.  ROS:  All other systems negative, except as noted in the HPI. Review of Systems  Objective: Vital Signs: There were no vitals taken for this visit.  Specialty Comments:  No specialty comments available.  PMFS History: Patient Active Problem List   Diagnosis Date Noted  . Steroid-induced hyperglycemia   . Controlled type 1 diabetes mellitus with hyperglycemia (HCC)   . Essential hypertension   . Sinus tachycardia   . Postoperative pain   .  S/P bilateral below knee amputation (HCC) 12/23/2019  . Below-knee amputation (HCC) 12/19/2019  . Gangrene of both feet (HCC)   . Heel ulcer, right, with necrosis of muscle (HCC)   . Chronic diastolic heart failure (HCC) 09/12/2013  . Rheumatoid arthritis (HCC) 09/12/2013  . IDDM (insulin dependent diabetes mellitus) 09/09/2013  . Cellulitis of right lower extremity 09/08/2013   Past Medical History:  Diagnosis Date  . Arthritis, rheumatoid (HCC)   . CHF (congestive heart failure) (HCC)    pt denies this dx  . Coronary artery disease   . Diabetes mellitus without complication (HCC)   . Headache   . Hyperlipidemia   . Hypertension   .  Latent tuberculosis    pt denies this dx  . Peripheral vascular disease (HCC)    blood clot in left leg  . Type 1 diabetes (HCC)     History reviewed. No pertinent family history.  Past Surgical History:  Procedure Laterality Date  . AMPUTATION Bilateral 12/19/2019   Procedure: BILATERAL BELOW KNEE AMPUTATION;  Surgeon: Nadara Mustard, MD;  Location: [facility] OR;  Service: Orthopedics;  Laterality: Bilateral;  . CARDIAC CATHETERIZATION  11/02/2019  . CARPAL TUNNEL RELEASE Bilateral   . EYE SURGERY Bilateral    09/2018  . FINGER SURGERY  07/11/2014   right middle finger - nodule removed  . FINGER SURGERY  02/20/2017   right middle finger  . FOOT SURGERY Right 02/26/2014   screw  . I & D EXTREMITY Right 08/15/2019   Procedure: RIGHT ACHILLES DEBRIDEMENT, POSSIBLE SKIN GRAFT;  Surgeon: Nadara Mustard, MD;  Location: [facility] OR;  Service: Orthopedics;  Laterality: Right;  . JOINT REPLACEMENT    . SHOULDER SURGERY Right 01/06/2005  . TRIGGER FINGER RELEASE  12/202002   left hand -   . WRIST SURGERY Left 10/23/2004   and middle finger  . WRIST SURGERY Right 11/05/2005  . WRIST SURGERY Right 03/11/2006   cyst removed   Social History   Occupational History  . Not on file  Tobacco Use  . Smoking status: Never Smoker  . Smokeless tobacco: Never Used  Vaping Use  . Vaping Use: Never used  Substance and Sexual Activity  . Alcohol use: No  . Drug use: No  . Sexual activity: Not on file

## 2020-05-20 ENCOUNTER — Ambulatory Visit (INDEPENDENT_AMBULATORY_CARE_PROVIDER_SITE_OTHER): Payer: Medicare Other | Admitting: Orthopedic Surgery

## 2020-05-20 DIAGNOSIS — I87333 Chronic venous hypertension (idiopathic) with ulcer and inflammation of bilateral lower extremity: Secondary | ICD-10-CM | POA: Diagnosis not present

## 2020-05-20 DIAGNOSIS — L97929 Non-pressure chronic ulcer of unspecified part of left lower leg with unspecified severity: Secondary | ICD-10-CM

## 2020-05-20 DIAGNOSIS — Z89512 Acquired absence of left leg below knee: Secondary | ICD-10-CM | POA: Diagnosis not present

## 2020-05-20 DIAGNOSIS — Z89511 Acquired absence of right leg below knee: Secondary | ICD-10-CM

## 2020-05-20 DIAGNOSIS — L97919 Non-pressure chronic ulcer of unspecified part of right lower leg with unspecified severity: Secondary | ICD-10-CM | POA: Diagnosis not present

## 2020-05-26 ENCOUNTER — Other Ambulatory Visit: Payer: Self-pay

## 2020-05-26 ENCOUNTER — Telehealth: Payer: Self-pay

## 2020-05-26 MED ORDER — DOXYCYCLINE HYCLATE 100 MG PO TABS: 100.0000 mg | ORAL_TABLET | Freq: Two times a day (BID) | ORAL | 0 refills | Status: DC

## 2020-05-26 NOTE — Telephone Encounter (Signed)
Patient called she is wondering if she needs to be on antibiotic before having surgery due to having pain and how long will she have to be in the hospital and will she have to go through physical therapy again call back:[phone]

## 2020-05-26 NOTE — Telephone Encounter (Signed)
Called pt and advised that Dr. Lajoyce Corners gave order  RX for Doxy bid and this was sent to pharm, she will be in the hospital in about 3 days and she will not need any additional therapy just assessment in hospital for tranfers etc. Pt asked if she could have a call back in the next day or so to discuss possible surgical dates for bilateral AKAs. Advised I would forward message to [place] and she will reach out to her sometime this week.

## 2020-05-28 NOTE — Telephone Encounter (Signed)
I spoke with Kaitlin Hudson.  She would like to be scheduled for bilateral AKAs next Wed 06/04/20.  Can you please fill out a surgery sheet? Thank you!

## 2020-05-29 ENCOUNTER — Other Ambulatory Visit: Payer: Self-pay

## 2020-06-02 ENCOUNTER — Other Ambulatory Visit: Payer: Self-pay | Admitting: Physician Assistant

## 2020-06-02 ENCOUNTER — Other Ambulatory Visit (HOSPITAL_COMMUNITY)
Admission: RE | Admit: 2020-06-02 | Discharge: 2020-06-02 | Disposition: A | Discharge status: Home / Self Care | Payer: Medicare Other | Source: Ambulatory Visit | Attending: Orthopedic Surgery | Admitting: Orthopedic Surgery

## 2020-06-02 DIAGNOSIS — Z20822 Contact with and (suspected) exposure to covid-19: Secondary | ICD-10-CM | POA: Insufficient documentation

## 2020-06-02 DIAGNOSIS — Z01812 Encounter for preprocedural laboratory examination: Secondary | ICD-10-CM | POA: Insufficient documentation

## 2020-06-03 ENCOUNTER — Other Ambulatory Visit: Payer: Self-pay

## 2020-06-03 ENCOUNTER — Encounter (HOSPITAL_COMMUNITY): Payer: Self-pay | Admitting: Orthopedic Surgery

## 2020-06-03 ENCOUNTER — Encounter: Payer: Self-pay | Admitting: Orthopedic Surgery

## 2020-06-03 LAB — SARS CORONAVIRUS 2 (TAT 6-24 HRS): SARS Coronavirus 2: NEGATIVE

## 2020-06-03 NOTE — Progress Notes (Signed)
Office Visit Note   Patient: Kaitlin Hudson           Date of Birth: 1955-06-29           MRN: [id number] Visit Date: 05/20/2020              Requested by: No referring provider defined for this encounter. PCP: Earley Abide (Inactive)  Chief Complaint  Patient presents with  . Right Leg - Follow-up    12/19/19 bilateral BKA   . Left Leg - Follow-up      HPI: Patient is a 65 year old woman who is status post bilateral below the knee amputations.  Patient has persistent nonresolving venous and lymphatic insufficiency in both legs with weeping edema she is currently using diapers to collect the fluid.  She states she is unable to use the compressive shrinkers.  She complains of burning pain.  She is 5 months out from surgery.  Assessment & Plan: Visit Diagnoses:  1. S/P bilateral below knee amputation (HCC)   2. Chronic venous hypertension (idiopathic) with ulcer and inflammation of bilateral lower extremity (HCC)     Plan: Discussed with the patient with the massive swelling and weeping edema her best treatment option would be to proceed with bilateral above-the-knee amputations.  Plan to reevaluate in 4 weeks she will call us if she wants to proceed with surgery sooner.  Follow-Up Instructions: Return in about 2 weeks (around 06/03/2020).   Ortho Exam  Patient is alert, oriented, no adenopathy, well-dressed, normal affect, normal respiratory effort. Examination patient has massive clear drainage from both legs there is pitting edema in both lower extremities patient states she currently sleeps sitting up with her legs dependent and sits with her legs dependent during the day as well essentially 24 hours a day with her legs dependent.  She states she has burning pain if she tries to use the compressive shrinkers.  There is skin maceration from the drainage.  Imaging: No results found. No images are attached to the encounter.  Labs: Lab Results  Component Value Date   HGBA1C  6.3 (H) 12/19/2019   HGBA1C 7.2 (H) 08/15/2019   LABURIC 2.5 09/11/2013   REPTSTATUS 08/20/2019 FINAL 08/15/2019   GRAMSTAIN  08/15/2019    RARE WBC PRESENT, PREDOMINANTLY PMN ABUNDANT GRAM POSITIVE RODS FEW GRAM POSITIVE COCCI RARE GRAM VARIABLE ROD    CULT  08/15/2019    ABUNDANT CORYNEBACTERIUM STRIATUM ABUNDANT HAEMOPHILUS PARAINFLUENZAE BETA LACTAMASE NEGATIVE NO ANAEROBES ISOLATED Performed at [facility], [address] [place], [place] [address]    LABORGA ESCHERICHIA COLI (A) 11/07/2015     Lab Results  Component Value Date   ALBUMIN 2.2 (L) 12/24/2019   ALBUMIN 3.1 (L) 07/21/2019   ALBUMIN 3.2 (L) 11/07/2015    No results found for: MG No results found for: VD25OH  No results found for: PREALBUMIN CBC EXTENDED Latest Ref Rng & Units 12/24/2019 12/21/2019 12/20/2019  WBC 4.0 - 10.5 K/uL 8.8 10.5 -  RBC 3.87 - 5.11 MIL/uL 3.54(L) 3.10(L) -  HGB 12.0 - 15.0 g/dL 7.8(E) 4.2(P) 5.3(I)  HCT 36.0 - 46.0 % 31.5(L) 27.6(L) 26.8(L)  PLT 150 - 400 K/uL 466(H) 263 -  NEUTROABS 1.7 - 7.7 K/uL 5.9 - -  LYMPHSABS 0.7 - 4.0 K/uL 1.5 - -     There is no height or weight on file to calculate BMI.  Orders:  No orders of the defined types were placed in this encounter.  No orders of the  defined types were placed in this encounter.    Procedures: No procedures performed  Clinical Data: No additional findings.  ROS:  All other systems negative, except as noted in the HPI. Review of Systems  Objective: Vital Signs: There were no vitals taken for this visit.  Specialty Comments:  No specialty comments available.  PMFS History: Patient Active Problem List   Diagnosis Date Noted  . Steroid-induced hyperglycemia   . Controlled type 1 diabetes mellitus with hyperglycemia (HCC)   . Essential hypertension   . Sinus tachycardia   . Postoperative pain   . S/P bilateral below knee amputation (HCC) 12/23/2019  . Below-knee amputation (HCC) 12/19/2019  .  Gangrene of both feet (HCC)   . Heel ulcer, right, with necrosis of muscle (HCC)   . Chronic diastolic heart failure (HCC) 09/12/2013  . Rheumatoid arthritis (HCC) 09/12/2013  . IDDM (insulin dependent diabetes mellitus) 09/09/2013  . Cellulitis of right lower extremity 09/08/2013   Past Medical History:  Diagnosis Date  . Arthritis, rheumatoid (HCC)   . CHF (congestive heart failure) (HCC)    pt denies this dx  . Coronary artery disease   . Diabetes mellitus without complication (HCC)   . Headache   . Hyperlipidemia   . Hypertension   . Latent tuberculosis    pt denies this dx  . Peripheral vascular disease (HCC)    blood clot in left leg  . Type 1 diabetes (HCC)     History reviewed. No pertinent family history.  Past Surgical History:  Procedure Laterality Date  . AMPUTATION Bilateral 12/19/2019   Procedure: BILATERAL BELOW KNEE AMPUTATION;  Surgeon: Nadara Mustard, MD;  Location: [facility] OR;  Service: Orthopedics;  Laterality: Bilateral;  . CARDIAC CATHETERIZATION  11/02/2019  . CARPAL TUNNEL RELEASE Bilateral   . EYE SURGERY Bilateral    09/2018  . FINGER SURGERY  07/11/2014   right middle finger - nodule removed  . FINGER SURGERY  02/20/2017   right middle finger  . FOOT SURGERY Right 02/26/2014   screw  . I & D EXTREMITY Right 08/15/2019   Procedure: RIGHT ACHILLES DEBRIDEMENT, POSSIBLE SKIN GRAFT;  Surgeon: Nadara Mustard, MD;  Location: [facility] OR;  Service: Orthopedics;  Laterality: Right;  . JOINT REPLACEMENT    . SHOULDER SURGERY Right 01/06/2005  . TRIGGER FINGER RELEASE  12/202002   left hand -   . WRIST SURGERY Left 10/23/2004   and middle finger  . WRIST SURGERY Right 11/05/2005  . WRIST SURGERY Right 03/11/2006   cyst removed   Social History   Occupational History  . Not on file  Tobacco Use  . Smoking status: Never Smoker  . Smokeless tobacco: Never Used  Vaping Use  . Vaping Use: Never used  Substance and Sexual Activity  . Alcohol use: No  . Drug  use: No  . Sexual activity: Not on file

## 2020-06-03 NOTE — Progress Notes (Signed)
Mrs. Kaitlin Hudson  denies chest pain or shortness of breath. Patient was tested for Covid and has been in quarantine since that time. Mrs. Kaitlin Hudson has type I diabetes, patient has an Insulin Pump, I instructed patient to decrease rate pump by 20% at midnight.  I instructed Mrs Kaitlin Hudson to take 70% of Novolin N- 12 units. at hs.  Ms Kaitlin Hudson has Insulin pump equipment packed to bring in with her.  Mrs. Kaitlin Hudson transferees from car to w/c and w/c to bed using a slide, she will bring hers tomorrow and have it given to her husband after she is on a stretcher.

## 2020-06-04 ENCOUNTER — Encounter (HOSPITAL_COMMUNITY): Payer: Self-pay | Admitting: Orthopedic Surgery

## 2020-06-04 ENCOUNTER — Inpatient Hospital Stay (HOSPITAL_COMMUNITY): Payer: Medicare Other | Admitting: Anesthesiology

## 2020-06-04 ENCOUNTER — Encounter (HOSPITAL_COMMUNITY)
Admission: RE | Disposition: A | Discharge status: Rehabilitation Facility | Payer: Self-pay | Source: Home / Self Care | Attending: Orthopedic Surgery

## 2020-06-04 ENCOUNTER — Inpatient Hospital Stay (HOSPITAL_COMMUNITY)
Admission: RE | Admit: 2020-06-04 | Discharge: 2020-06-08 | DRG: 475 | Disposition: A | Discharge status: Rehabilitation Facility | Payer: Medicare Other | Attending: Orthopedic Surgery | Admitting: Orthopedic Surgery

## 2020-06-04 DIAGNOSIS — Z89511 Acquired absence of right leg below knee: Secondary | ICD-10-CM

## 2020-06-04 DIAGNOSIS — D72829 Elevated white blood cell count, unspecified: Secondary | ICD-10-CM

## 2020-06-04 DIAGNOSIS — Z79899 Other long term (current) drug therapy: Secondary | ICD-10-CM

## 2020-06-04 DIAGNOSIS — R519 Headache, unspecified: Secondary | ICD-10-CM | POA: Diagnosis present

## 2020-06-04 DIAGNOSIS — Z9641 Presence of insulin pump (external) (internal): Secondary | ICD-10-CM | POA: Diagnosis present

## 2020-06-04 DIAGNOSIS — Z7952 Long term (current) use of systemic steroids: Secondary | ICD-10-CM

## 2020-06-04 DIAGNOSIS — Z7902 Long term (current) use of antithrombotics/antiplatelets: Secondary | ICD-10-CM

## 2020-06-04 DIAGNOSIS — Z4781 Encounter for orthopedic aftercare following surgical amputation: Secondary | ICD-10-CM | POA: Diagnosis present

## 2020-06-04 DIAGNOSIS — Z91040 Latex allergy status: Secondary | ICD-10-CM

## 2020-06-04 DIAGNOSIS — Z7901 Long term (current) use of anticoagulants: Secondary | ICD-10-CM

## 2020-06-04 DIAGNOSIS — I2510 Atherosclerotic heart disease of native coronary artery without angina pectoris: Secondary | ICD-10-CM | POA: Diagnosis present

## 2020-06-04 DIAGNOSIS — Z794 Long term (current) use of insulin: Secondary | ICD-10-CM

## 2020-06-04 DIAGNOSIS — Z8615 Personal history of latent tuberculosis infection: Secondary | ICD-10-CM

## 2020-06-04 DIAGNOSIS — I10 Essential (primary) hypertension: Secondary | ICD-10-CM | POA: Diagnosis present

## 2020-06-04 DIAGNOSIS — E785 Hyperlipidemia, unspecified: Secondary | ICD-10-CM | POA: Diagnosis present

## 2020-06-04 DIAGNOSIS — E559 Vitamin D deficiency, unspecified: Secondary | ICD-10-CM | POA: Diagnosis present

## 2020-06-04 DIAGNOSIS — M069 Rheumatoid arthritis, unspecified: Secondary | ICD-10-CM | POA: Diagnosis present

## 2020-06-04 DIAGNOSIS — M62838 Other muscle spasm: Secondary | ICD-10-CM | POA: Diagnosis present

## 2020-06-04 DIAGNOSIS — Z882 Allergy status to sulfonamides status: Secondary | ICD-10-CM | POA: Diagnosis not present

## 2020-06-04 DIAGNOSIS — Z89512 Acquired absence of left leg below knee: Secondary | ICD-10-CM | POA: Diagnosis not present

## 2020-06-04 DIAGNOSIS — R58 Hemorrhage, not elsewhere classified: Secondary | ICD-10-CM | POA: Diagnosis not present

## 2020-06-04 DIAGNOSIS — T8781 Dehiscence of amputation stump: Secondary | ICD-10-CM | POA: Diagnosis present

## 2020-06-04 DIAGNOSIS — N179 Acute kidney failure, unspecified: Secondary | ICD-10-CM | POA: Diagnosis present

## 2020-06-04 DIAGNOSIS — K5903 Drug induced constipation: Secondary | ICD-10-CM | POA: Diagnosis not present

## 2020-06-04 DIAGNOSIS — F32A Depression, unspecified: Secondary | ICD-10-CM | POA: Diagnosis present

## 2020-06-04 DIAGNOSIS — Z9109 Other allergy status, other than to drugs and biological substances: Secondary | ICD-10-CM | POA: Diagnosis not present

## 2020-06-04 DIAGNOSIS — E1065 Type 1 diabetes mellitus with hyperglycemia: Secondary | ICD-10-CM | POA: Diagnosis present

## 2020-06-04 DIAGNOSIS — Z880 Allergy status to penicillin: Secondary | ICD-10-CM | POA: Diagnosis not present

## 2020-06-04 DIAGNOSIS — Z86718 Personal history of other venous thrombosis and embolism: Secondary | ICD-10-CM

## 2020-06-04 DIAGNOSIS — Z881 Allergy status to other antibiotic agents status: Secondary | ICD-10-CM

## 2020-06-04 DIAGNOSIS — Y835 Amputation of limb(s) as the cause of abnormal reaction of the patient, or of later complication, without mention of misadventure at the time of the procedure: Secondary | ICD-10-CM | POA: Diagnosis present

## 2020-06-04 DIAGNOSIS — Z885 Allergy status to narcotic agent status: Secondary | ICD-10-CM

## 2020-06-04 DIAGNOSIS — Z89612 Acquired absence of left leg above knee: Secondary | ICD-10-CM | POA: Diagnosis not present

## 2020-06-04 DIAGNOSIS — N182 Chronic kidney disease, stage 2 (mild): Secondary | ICD-10-CM | POA: Diagnosis present

## 2020-06-04 DIAGNOSIS — Z20822 Contact with and (suspected) exposure to covid-19: Secondary | ICD-10-CM | POA: Diagnosis present

## 2020-06-04 DIAGNOSIS — E108 Type 1 diabetes mellitus with unspecified complications: Secondary | ICD-10-CM | POA: Diagnosis not present

## 2020-06-04 DIAGNOSIS — I5032 Chronic diastolic (congestive) heart failure: Secondary | ICD-10-CM | POA: Diagnosis present

## 2020-06-04 DIAGNOSIS — I87333 Chronic venous hypertension (idiopathic) with ulcer and inflammation of bilateral lower extremity: Secondary | ICD-10-CM | POA: Diagnosis present

## 2020-06-04 DIAGNOSIS — K5901 Slow transit constipation: Secondary | ICD-10-CM | POA: Diagnosis present

## 2020-06-04 DIAGNOSIS — Z887 Allergy status to serum and vaccine status: Secondary | ICD-10-CM | POA: Diagnosis not present

## 2020-06-04 DIAGNOSIS — I130 Hypertensive heart and chronic kidney disease with heart failure and stage 1 through stage 4 chronic kidney disease, or unspecified chronic kidney disease: Secondary | ICD-10-CM | POA: Diagnosis present

## 2020-06-04 DIAGNOSIS — Z89611 Acquired absence of right leg above knee: Secondary | ICD-10-CM | POA: Diagnosis not present

## 2020-06-04 DIAGNOSIS — L89322 Pressure ulcer of left buttock, stage 2: Secondary | ICD-10-CM | POA: Diagnosis not present

## 2020-06-04 DIAGNOSIS — Z7982 Long term (current) use of aspirin: Secondary | ICD-10-CM

## 2020-06-04 DIAGNOSIS — D62 Acute posthemorrhagic anemia: Secondary | ICD-10-CM | POA: Diagnosis present

## 2020-06-04 DIAGNOSIS — F411 Generalized anxiety disorder: Secondary | ICD-10-CM | POA: Diagnosis present

## 2020-06-04 DIAGNOSIS — E1169 Type 2 diabetes mellitus with other specified complication: Secondary | ICD-10-CM | POA: Diagnosis not present

## 2020-06-04 DIAGNOSIS — E1022 Type 1 diabetes mellitus with diabetic chronic kidney disease: Secondary | ICD-10-CM | POA: Diagnosis present

## 2020-06-04 DIAGNOSIS — T8130XA Disruption of wound, unspecified, initial encounter: Secondary | ICD-10-CM | POA: Diagnosis present

## 2020-06-04 DIAGNOSIS — E669 Obesity, unspecified: Secondary | ICD-10-CM | POA: Diagnosis not present

## 2020-06-04 DIAGNOSIS — E1051 Type 1 diabetes mellitus with diabetic peripheral angiopathy without gangrene: Secondary | ICD-10-CM | POA: Diagnosis present

## 2020-06-04 DIAGNOSIS — E10649 Type 1 diabetes mellitus with hypoglycemia without coma: Secondary | ICD-10-CM | POA: Diagnosis not present

## 2020-06-04 DIAGNOSIS — I739 Peripheral vascular disease, unspecified: Secondary | ICD-10-CM | POA: Diagnosis not present

## 2020-06-04 DIAGNOSIS — Z6841 Body Mass Index (BMI) 40.0 and over, adult: Secondary | ICD-10-CM | POA: Diagnosis not present

## 2020-06-04 DIAGNOSIS — G8918 Other acute postprocedural pain: Secondary | ICD-10-CM | POA: Diagnosis present

## 2020-06-04 DIAGNOSIS — F419 Anxiety disorder, unspecified: Secondary | ICD-10-CM | POA: Diagnosis present

## 2020-06-04 HISTORY — DX: Personal history of other medical treatment: Z92.89

## 2020-06-04 HISTORY — PX: AMPUTATION: SHX166

## 2020-06-04 HISTORY — DX: Anxiety disorder, unspecified: F41.9

## 2020-06-04 HISTORY — DX: Depression, unspecified: F32.A

## 2020-06-04 HISTORY — DX: Acute embolism and thrombosis of unspecified deep veins of unspecified lower extremity: I82.409

## 2020-06-04 HISTORY — PX: APPLICATION OF WOUND VAC: SHX5189

## 2020-06-04 LAB — CBC
HCT: 36 % (ref 36.0–46.0)
Hemoglobin: 10.7 g/dL — ABNORMAL LOW (ref 12.0–15.0)
MCH: 24.7 pg — ABNORMAL LOW (ref 26.0–34.0)
MCHC: 29.7 g/dL — ABNORMAL LOW (ref 30.0–36.0)
MCV: 83.1 fL (ref 80.0–100.0)
Platelets: 336 10*3/uL (ref 150–400)
RBC: 4.33 MIL/uL (ref 3.87–5.11)
RDW: 18.2 % — ABNORMAL HIGH (ref 11.5–15.5)
WBC: 11.5 10*3/uL — ABNORMAL HIGH (ref 4.0–10.5)
nRBC: 0 % (ref 0.0–0.2)

## 2020-06-04 LAB — GLUCOSE, CAPILLARY
Glucose-Capillary: 252 mg/dL — ABNORMAL HIGH (ref 70–99)
Glucose-Capillary: 242 mg/dL — ABNORMAL HIGH (ref 70–99)
Glucose-Capillary: 285 mg/dL — ABNORMAL HIGH (ref 70–99)
Glucose-Capillary: 416 mg/dL — ABNORMAL HIGH (ref 70–99)
Glucose-Capillary: 421 mg/dL — ABNORMAL HIGH (ref 70–99)
Glucose-Capillary: 417 mg/dL — ABNORMAL HIGH (ref 70–99)

## 2020-06-04 LAB — BASIC METABOLIC PANEL
Anion gap: 6 (ref 5–15)
BUN: 67 mg/dL — ABNORMAL HIGH (ref 8–23)
CO2: 18 mmol/L — ABNORMAL LOW (ref 22–32)
Calcium: 8.9 mg/dL (ref 8.9–10.3)
Chloride: 113 mmol/L — ABNORMAL HIGH (ref 98–111)
Creatinine, Ser: 1.27 mg/dL — ABNORMAL HIGH (ref 0.44–1.00)
GFR, Estimated: 47 mL/min — ABNORMAL LOW (ref >60)
Glucose, Bld: 267 mg/dL — ABNORMAL HIGH (ref 70–99)
Potassium: 5.8 mmol/L — ABNORMAL HIGH (ref 3.5–5.1)
Sodium: 137 mmol/L (ref 135–145)

## 2020-06-04 LAB — HEMOGLOBIN A1C
Hgb A1c MFr Bld: 7.1 % — ABNORMAL HIGH (ref 4.8–5.6)
Mean Plasma Glucose: 157.07 mg/dL

## 2020-06-04 SURGERY — AMPUTATION, ABOVE KNEE
Anesthesia: General | Site: Leg Upper | Laterality: Bilateral

## 2020-06-04 MED ORDER — FENTANYL CITRATE (PF) 100 MCG/2ML IJ SOLN
25.0000 ug | INTRAMUSCULAR | Status: DC | PRN
Start: 2020-06-04 — End: 2020-06-04
  Administered 2020-06-04 (×3): 50 ug via INTRAVENOUS

## 2020-06-04 MED ORDER — MIDAZOLAM HCL 2 MG/2ML IJ SOLN
INTRAMUSCULAR | Status: DC | PRN
  Administered 2020-06-04: 2 mg via INTRAVENOUS

## 2020-06-04 MED ORDER — DEXAMETHASONE SODIUM PHOSPHATE 10 MG/ML IJ SOLN
INTRAMUSCULAR | Status: AC
  Filled 2020-06-04: qty 1

## 2020-06-04 MED ORDER — INSULIN PUMP: SUBCUTANEOUS | Status: DC

## 2020-06-04 MED ORDER — HYDROMORPHONE HCL 1 MG/ML IJ SOLN
0.5000 mg | INTRAMUSCULAR | Status: DC | PRN
  Administered 2020-06-04 – 2020-06-07 (×5): 0.5 mg via INTRAVENOUS
  Filled 2020-06-04 (×6): qty 0.5

## 2020-06-04 MED ORDER — FENTANYL CITRATE (PF) 250 MCG/5ML IJ SOLN
INTRAMUSCULAR | Status: AC
  Filled 2020-06-04: qty 5

## 2020-06-04 MED ORDER — DEXAMETHASONE SODIUM PHOSPHATE 10 MG/ML IJ SOLN
INTRAMUSCULAR | Status: DC | PRN
  Administered 2020-06-04: 5 mg via INTRAVENOUS

## 2020-06-04 MED ORDER — INSULIN NPH (HUMAN) (ISOPHANE) 100 UNIT/ML ~~LOC~~ SUSP
12.0000 [IU] | Freq: Every day | SUBCUTANEOUS | Status: DC
  Administered 2020-06-04: 12 [IU] via SUBCUTANEOUS
  Filled 2020-06-04: qty 10

## 2020-06-04 MED ORDER — SODIUM CHLORIDE 0.9 % IV SOLN: INTRAVENOUS | Status: DC

## 2020-06-04 MED ORDER — CLINDAMYCIN PHOSPHATE 600 MG/50ML IV SOLN
600.0000 mg | Freq: Four times a day (QID) | INTRAVENOUS | Status: AC
  Administered 2020-06-04 – 2020-06-05 (×3): 600 mg via INTRAVENOUS
  Filled 2020-06-04 (×3): qty 50

## 2020-06-04 MED ORDER — LISINOPRIL 20 MG PO TABS
40.0000 mg | ORAL_TABLET | Freq: Every day | ORAL | Status: DC
  Administered 2020-06-04 – 2020-06-08 (×4): 40 mg via ORAL
  Filled 2020-06-04 (×5): qty 2

## 2020-06-04 MED ORDER — CLOPIDOGREL BISULFATE 75 MG PO TABS
75.0000 mg | ORAL_TABLET | Freq: Every day | ORAL | Status: DC
  Administered 2020-06-05 – 2020-06-08 (×4): 75 mg via ORAL
  Filled 2020-06-04 (×4): qty 1

## 2020-06-04 MED ORDER — VITAMIN D 25 MCG (1000 UNIT) PO TABS
1000.0000 [IU] | ORAL_TABLET | Freq: Every day | ORAL | Status: DC
  Administered 2020-06-04 – 2020-06-08 (×5): 1000 [IU] via ORAL
  Filled 2020-06-04 (×8): qty 1

## 2020-06-04 MED ORDER — LIDOCAINE 2% (20 MG/ML) 5 ML SYRINGE
INTRAMUSCULAR | Status: AC
  Filled 2020-06-04: qty 5

## 2020-06-04 MED ORDER — FENTANYL CITRATE (PF) 100 MCG/2ML IJ SOLN
INTRAMUSCULAR | Status: AC
  Filled 2020-06-04: qty 2

## 2020-06-04 MED ORDER — HYDROMORPHONE HCL 1 MG/ML IJ SOLN
0.5000 mg | INTRAMUSCULAR | Status: AC | PRN
  Administered 2020-06-04 (×4): 0.5 mg via INTRAVENOUS

## 2020-06-04 MED ORDER — LACTATED RINGERS IV SOLN: INTRAVENOUS | Status: DC

## 2020-06-04 MED ORDER — ONDANSETRON HCL 4 MG/2ML IJ SOLN: 4.0000 mg | Freq: Four times a day (QID) | INTRAMUSCULAR | Status: DC | PRN

## 2020-06-04 MED ORDER — DOCUSATE SODIUM 100 MG PO CAPS
100.0000 mg | ORAL_CAPSULE | Freq: Two times a day (BID) | ORAL | Status: DC
  Administered 2020-06-05 – 2020-06-08 (×7): 100 mg via ORAL
  Filled 2020-06-04 (×8): qty 1

## 2020-06-04 MED ORDER — PROPOFOL 10 MG/ML IV BOLUS
INTRAVENOUS | Status: DC | PRN
  Administered 2020-06-04: 120 mg via INTRAVENOUS

## 2020-06-04 MED ORDER — PHENYLEPHRINE 40 MCG/ML (10ML) SYRINGE FOR IV PUSH (FOR BLOOD PRESSURE SUPPORT)
PREFILLED_SYRINGE | INTRAVENOUS | Status: DC | PRN
  Administered 2020-06-04 (×4): 80 ug via INTRAVENOUS

## 2020-06-04 MED ORDER — APIXABAN 5 MG PO TABS
5.0000 mg | ORAL_TABLET | Freq: Two times a day (BID) | ORAL | Status: DC
Start: 2020-06-05 — End: 2020-06-06
  Administered 2020-06-05: 5 mg via ORAL
  Filled 2020-06-04 (×4): qty 1

## 2020-06-04 MED ORDER — 0.9 % SODIUM CHLORIDE (POUR BTL) OPTIME
TOPICAL | Status: DC | PRN
  Administered 2020-06-04: 1000 mL

## 2020-06-04 MED ORDER — ONDANSETRON HCL 4 MG/2ML IJ SOLN
INTRAMUSCULAR | Status: AC
  Filled 2020-06-04: qty 2

## 2020-06-04 MED ORDER — ONDANSETRON HCL 4 MG PO TABS: 4.0000 mg | ORAL_TABLET | Freq: Four times a day (QID) | ORAL | Status: DC | PRN

## 2020-06-04 MED ORDER — ROCURONIUM BROMIDE 10 MG/ML (PF) SYRINGE
PREFILLED_SYRINGE | INTRAVENOUS | Status: AC
  Filled 2020-06-04: qty 10

## 2020-06-04 MED ORDER — PREDNISONE 5 MG PO TABS
12.0000 mg | ORAL_TABLET | Freq: Every day | ORAL | Status: DC
  Administered 2020-06-05 – 2020-06-08 (×4): 12.5 mg via ORAL
  Filled 2020-06-04 (×4): qty 3

## 2020-06-04 MED ORDER — CLINDAMYCIN PHOSPHATE 900 MG/50ML IV SOLN
900.0000 mg | INTRAVENOUS | Status: AC
  Administered 2020-06-04: 900 mg via INTRAVENOUS
  Filled 2020-06-04: qty 50

## 2020-06-04 MED ORDER — FENTANYL CITRATE (PF) 250 MCG/5ML IJ SOLN
INTRAMUSCULAR | Status: DC | PRN
  Administered 2020-06-04 (×2): 50 ug via INTRAVENOUS
  Administered 2020-06-04: 100 ug via INTRAVENOUS
  Administered 2020-06-04: 50 ug via INTRAVENOUS

## 2020-06-04 MED ORDER — INSULIN ASPART 100 UNIT/ML ~~LOC~~ SOLN
0.0000 [IU] | Freq: Three times a day (TID) | SUBCUTANEOUS | Status: DC
  Administered 2020-06-04: 9 [IU] via SUBCUTANEOUS

## 2020-06-04 MED ORDER — MIDAZOLAM HCL 2 MG/2ML IJ SOLN
INTRAMUSCULAR | Status: AC
  Filled 2020-06-04: qty 2

## 2020-06-04 MED ORDER — METOCLOPRAMIDE HCL 5 MG PO TABS
5.0000 mg | ORAL_TABLET | Freq: Three times a day (TID) | ORAL | Status: DC | PRN
Start: 2020-06-04 — End: 2020-06-08

## 2020-06-04 MED ORDER — LIDOCAINE 2% (20 MG/ML) 5 ML SYRINGE
INTRAMUSCULAR | Status: DC | PRN
  Administered 2020-06-04: 40 mg via INTRAVENOUS

## 2020-06-04 MED ORDER — METOPROLOL TARTRATE 12.5 MG HALF TABLET
12.5000 mg | ORAL_TABLET | Freq: Two times a day (BID) | ORAL | Status: DC
  Administered 2020-06-04 – 2020-06-08 (×7): 12.5 mg via ORAL
  Filled 2020-06-04 (×8): qty 1

## 2020-06-04 MED ORDER — SUGAMMADEX SODIUM 200 MG/2ML IV SOLN
INTRAVENOUS | Status: DC | PRN
  Administered 2020-06-04: 300 mg via INTRAVENOUS

## 2020-06-04 MED ORDER — PHENYLEPHRINE 40 MCG/ML (10ML) SYRINGE FOR IV PUSH (FOR BLOOD PRESSURE SUPPORT)
PREFILLED_SYRINGE | INTRAVENOUS | Status: AC
  Filled 2020-06-04: qty 10

## 2020-06-04 MED ORDER — PROPOFOL 10 MG/ML IV BOLUS
INTRAVENOUS | Status: AC
  Filled 2020-06-04: qty 20

## 2020-06-04 MED ORDER — PREDNISONE 5 MG PO TABS
2.5000 mg | ORAL_TABLET | Freq: Every day | ORAL | Status: DC
  Administered 2020-06-04 – 2020-06-07 (×4): 2.5 mg via ORAL
  Filled 2020-06-04 (×6): qty 1

## 2020-06-04 MED ORDER — ROCURONIUM BROMIDE 10 MG/ML (PF) SYRINGE
PREFILLED_SYRINGE | INTRAVENOUS | Status: DC | PRN
  Administered 2020-06-04: 45 mg via INTRAVENOUS

## 2020-06-04 MED ORDER — OXYCODONE HCL 5 MG PO TABS: 5.0000 mg | ORAL_TABLET | Freq: Once | ORAL | Status: DC | PRN

## 2020-06-04 MED ORDER — ACETAMINOPHEN 325 MG PO TABS
325.0000 mg | ORAL_TABLET | Freq: Four times a day (QID) | ORAL | Status: DC | PRN
  Administered 2020-06-05 – 2020-06-06 (×2): 650 mg via ORAL
  Filled 2020-06-04 (×2): qty 2

## 2020-06-04 MED ORDER — ONDANSETRON HCL 4 MG/2ML IJ SOLN
INTRAMUSCULAR | Status: DC | PRN
  Administered 2020-06-04: 4 mg via INTRAVENOUS

## 2020-06-04 MED ORDER — ASPIRIN EC 81 MG PO TBEC
81.0000 mg | DELAYED_RELEASE_TABLET | Freq: Every day | ORAL | Status: DC
  Administered 2020-06-04 – 2020-06-08 (×5): 81 mg via ORAL
  Filled 2020-06-04 (×5): qty 1

## 2020-06-04 MED ORDER — OXYCODONE HCL 5 MG/5ML PO SOLN: 5.0000 mg | Freq: Once | ORAL | Status: DC | PRN

## 2020-06-04 MED ORDER — METOCLOPRAMIDE HCL 5 MG/ML IJ SOLN: 5.0000 mg | Freq: Three times a day (TID) | INTRAMUSCULAR | Status: DC | PRN

## 2020-06-04 MED ORDER — HYDROMORPHONE HCL 1 MG/ML IJ SOLN
INTRAMUSCULAR | Status: AC
  Filled 2020-06-04: qty 1

## 2020-06-04 MED ORDER — KETOROLAC TROMETHAMINE 15 MG/ML IJ SOLN
7.5000 mg | Freq: Four times a day (QID) | INTRAMUSCULAR | Status: AC
  Administered 2020-06-04 – 2020-06-05 (×4): 7.5 mg via INTRAVENOUS
  Filled 2020-06-04 (×5): qty 1

## 2020-06-04 MED ORDER — OXYCODONE HCL 5 MG PO TABS
5.0000 mg | ORAL_TABLET | ORAL | Status: DC | PRN
  Administered 2020-06-04 – 2020-06-08 (×10): 10 mg via ORAL
  Filled 2020-06-04 (×11): qty 2

## 2020-06-04 SURGICAL SUPPLY — 45 items
BLADE SAW RECIP 87.9 MT (BLADE) ×3 IMPLANT
BLADE SURG 21 STRL SS (BLADE) ×3 IMPLANT
BNDG COHESIVE 6X5 TAN STRL LF (GAUZE/BANDAGES/DRESSINGS) ×5 IMPLANT
CANISTER WOUND CARE 500ML ATS (WOUND CARE) ×2 IMPLANT
COVER SURGICAL LIGHT HANDLE (MISCELLANEOUS) ×3 IMPLANT
COVER WAND RF STERILE (DRAPES) IMPLANT
CUFF TOURN SGL QUICK 34 (TOURNIQUET CUFF)
CUFF TRNQT CYL 34X4.125X (TOURNIQUET CUFF) IMPLANT
DRAPE DERMATAC (DRAPES) ×4 IMPLANT
DRAPE INCISE 23X17 IOBAN STRL (DRAPES) ×1
DRAPE INCISE 23X17 STRL (DRAPES) IMPLANT
DRAPE INCISE IOBAN 23X17 STRL (DRAPES) ×2 IMPLANT
DRAPE INCISE IOBAN 66X45 STRL (DRAPES) ×6 IMPLANT
DRAPE U-SHAPE 47X51 STRL (DRAPES) ×4 IMPLANT
DRESSING PREVENA PLUS CUSTOM (GAUZE/BANDAGES/DRESSINGS) ×2 IMPLANT
DRSG PREVENA PLUS CUSTOM (GAUZE/BANDAGES/DRESSINGS) ×6
DURAPREP 26ML APPLICATOR (WOUND CARE) ×3 IMPLANT
ELECT REM PT RETURN 9FT ADLT (ELECTROSURGICAL) ×3
ELECTRODE REM PT RTRN 9FT ADLT (ELECTROSURGICAL) ×2 IMPLANT
GLOVE BIOGEL PI IND STRL 7.5 (GLOVE) ×2 IMPLANT
GLOVE BIOGEL PI IND STRL 9 (GLOVE) ×2 IMPLANT
GLOVE BIOGEL PI INDICATOR 7.5 (GLOVE) ×1
GLOVE BIOGEL PI INDICATOR 9 (GLOVE) ×1
GLOVE SURG ORTHO 9.0 STRL STRW (GLOVE) ×3 IMPLANT
GLOVE SURG SS PI 6.5 STRL IVOR (GLOVE) ×3 IMPLANT
GOWN STRL REUS W/ TWL LRG LVL3 (GOWN DISPOSABLE) ×2 IMPLANT
GOWN STRL REUS W/ TWL XL LVL3 (GOWN DISPOSABLE) ×4 IMPLANT
GOWN STRL REUS W/TWL LRG LVL3 (GOWN DISPOSABLE) ×6
GOWN STRL REUS W/TWL XL LVL3 (GOWN DISPOSABLE) ×3
KIT BASIN OR (CUSTOM PROCEDURE TRAY) ×3 IMPLANT
KIT TURNOVER KIT B (KITS) ×3 IMPLANT
MANIFOLD NEPTUNE II (INSTRUMENTS) ×3 IMPLANT
NS IRRIG 1000ML POUR BTL (IV SOLUTION) ×3 IMPLANT
PACK ORTHO EXTREMITY (CUSTOM PROCEDURE TRAY) ×3 IMPLANT
PAD ARMBOARD 7.5X6 YLW CONV (MISCELLANEOUS) ×3 IMPLANT
PREVENA RESTOR ARTHOFORM 46X30 (CANNISTER) ×3 IMPLANT
PREVENA RESTOR AXIOFORM 29X28 (GAUZE/BANDAGES/DRESSINGS) ×2 IMPLANT
STAPLER VISISTAT 35W (STAPLE) IMPLANT
STOCKINETTE IMPERVIOUS LG (DRAPES) IMPLANT
SUT ETHILON 2 0 PSLX (SUTURE) ×8 IMPLANT
SUT SILK 2 0 (SUTURE) ×3
SUT SILK 2-0 18XBRD TIE 12 (SUTURE) ×2 IMPLANT
TOWEL GREEN STERILE FF (TOWEL DISPOSABLE) ×3 IMPLANT
TUBE CONNECTING 20X1/4 (TUBING) ×3 IMPLANT
YANKAUER SUCT BULB TIP NO VENT (SUCTIONS) ×3 IMPLANT

## 2020-06-04 NOTE — Op Note (Signed)
06/04/2020  10:50 AM  PATIENT:  Kaitlin Hudson    PRE-OPERATIVE DIAGNOSIS:  Dehiscence Bilateral Below Knee Amputation  POST-OPERATIVE DIAGNOSIS:  Same  PROCEDURE:  BILATERAL ABOVE KNEE AMPUTATION  SURGEON:  Nadara Mustard, MD  PHYSICIAN ASSISTANT:None ANESTHESIA:   General  PREOPERATIVE INDICATIONS:  Kamariyah Timberlake is a  65 y.o. female with a diagnosis of Dehiscence Bilateral Below Knee Amputation who failed conservative measures and elected for surgical management.    The risks benefits and alternatives were discussed with the patient preoperatively including but not limited to the risks of infection, bleeding, nerve injury, cardiopulmonary complications, the need for revision surgery, among others, and the patient was willing to proceed.  OPERATIVE IMPLANTS: Praveena wound vacs x2  @ENCIMAGES @  OPERATIVE FINDINGS: Healthy viable muscle at the amputation site no signs of infection.  OPERATIVE PROCEDURE: Patient was brought the operating room and underwent a general anesthetic.  After adequate levels anesthesia were obtained patient's both lower extremities were prepped using DuraPrep draped into a sterile field a timeout was called.  Attention was first focused on the right lower extremity.  A fishmouth incision was made just proximal to the the patella.  The incision was carried down and care was taken to be extra-articular from the suprapatellar pouch.  This was carried down to the femur and a reciprocating saw was used to perform the above knee amputation.  The vascular bundle was clamped and suture ligated with 2-0 silk.  The amputation was completed electrocautery was used for hemostasis.  The deep fascial layer was closed using #1 Vicryl skin was closed using 2-0 nylon and staples attention was then focused on the left lower extremity.  A fishmouth incision was made just proximal to the patella this was carried down extra-articular to the suprapatellar pouch a reciprocating saw was used  to perform the above-the-knee amputation the femoral vessels were clamped and suture ligated with 2-0 silk the amputation was completed hemostasis was obtained the deep fascial layer was closed using #1 Vicryl skin was closed using 2-0 nylon and staples.  The customizable and arthroform wound vacs were applied to both lower extremities covered with derma tack this had a good suction fit patient was taken to the PACU in stable condition   DISCHARGE PLANNING:  Antibiotic duration: 24 hours  Weightbearing: Transfers only  Pain medication: Opioid left leg  Dressing care/ Wound VAC: Wound vacs x2  Ambulatory devices: Wheelchair  Discharge to: Anticipate discharge to home  Follow-up: In the office 1 week post operative.

## 2020-06-04 NOTE — Plan of Care (Signed)
?  Problem: Activity: ?Goal: Risk for activity intolerance will decrease ?Outcome: Progressing ?  ?Problem: Pain Managment: ?Goal: General experience of comfort will improve ?Outcome: Progressing ?  ?Problem: Nutrition: ?Goal: Adequate nutrition will be maintained ?Outcome: Progressing ?  ?

## 2020-06-04 NOTE — TOC Initial Note (Signed)
Transition of Care [facility]) - Initial/Assessment Note    Patient Details  Name: Kaitlin Hudson MRN: [id number] Date of Birth: 1955/09/29  [facility] CM/SW Contact:    Epifanio Lesches, RN Phone Number: 06/04/2020, 3:16 PM  Clinical Narrative:                 Admitted with Dehiscence Bilateral Below Knee Amputation. From home with husband. Pt already has  W/CX2, power W/C, shower bench, bariatric BSC. Husband states pt has used [facility] in the past and if [facility] is need @ d/c would like to use again.       -s/p  BILATERAL ABOVE KNEE AMPUTATION /  Praveena wound vacs x2, 3/23  PT/OT evaluations pending....  TOC team monitoring and will assist with TOC needs....   Caralee Ates Leary (Spouse)     [phone]        Expected Discharge Plan: Home/Self Care Barriers to Discharge: Continued Medical Work up   Patient Goals and CMS Choice        Expected Discharge Plan and Services Expected Discharge Plan: Home/Self Care                                              Prior Living Arrangements/Services                       Activities of Daily Living Home Assistive Devices/Equipment: Built-in shower seat,Wheelchair,Tub transfer bench ADL Screening (condition at time of admission) Patient's cognitive ability adequate to safely complete daily activities?: No Does the patient have difficulty seeing, even when wearing glasses/contacts?: No Does the patient have difficulty concentrating, remembering, or making decisions?: No Patient able to express need for assistance with ADLs?: Yes Does the patient have difficulty dressing or bathing?: Yes Independently performs ADLs?: No Communication: Independent Dressing (OT): Needs assistance Is this a change from baseline?: Pre-admission baseline Grooming: Needs assistance Is this a change from baseline?: Pre-admission baseline Feeding: Independent Bathing: Needs assistance Is this a change from baseline?:  Pre-admission baseline Toileting: Needs assistance,Independent with device (comment) Is this a change from baseline?: Pre-admission baseline In/Out Bed: Needs assistance,Independent with device (comment) Is this a change from baseline?: Pre-admission baseline Walks in Home: Dependent Is this a change from baseline?: Pre-admission baseline Does the patient have difficulty walking or climbing stairs?: Yes Weakness of Arms/Hands: None  Permission Sought/Granted                  Emotional Assessment              Admission diagnosis:  Wound dehiscence [T81.30XA] Patient Active Problem List   Diagnosis Date Noted  . Wound dehiscence 06/04/2020  . Dehiscence of amputation stump (HCC)   . Steroid-induced hyperglycemia   . Controlled type 1 diabetes mellitus with hyperglycemia (HCC)   . Essential hypertension   . Sinus tachycardia   . Postoperative pain   . S/P bilateral below knee amputation (HCC) 12/23/2019  . Below-knee amputation (HCC) 12/19/2019  . Gangrene of both feet (HCC)   . Heel ulcer, right, with necrosis of muscle (HCC)   . Chronic diastolic heart failure (HCC) 09/12/2013  . Rheumatoid arthritis (HCC) 09/12/2013  . IDDM (insulin dependent diabetes mellitus) 09/09/2013  . Cellulitis of right lower extremity 09/08/2013   PCP:  [name] L. (Inactive) Pharmacy:   [facility] - [place], [place] - [address] [place] [address] Phone: [phone] Fax: [phone]  [facility] [place], [place] - [address] [place] [address] Phone: [phone] Fax: [phone]     Social Determinants of Health (SDOH) Interventions    Readmission Risk Interventions Readmission Risk Prevention Plan 12/21/2019 08/17/2019  Post Dischage Appt - Complete  Medication Screening - Complete  Transportation Screening Complete Complete  PCP or Specialist Appt within 5-7 Days Not Complete -  Not Complete  comments pt planning on CIR vs SNF -  Home Care Screening Complete -  Medication Review (RN CM) Referral to Pharmacy -  Some recent data might be hidden

## 2020-06-04 NOTE — Progress Notes (Signed)
BS 421, paged [facility]. Await on returned call. Marland Kitchen

## 2020-06-04 NOTE — Transfer of Care (Signed)
Immediate Anesthesia Transfer of Care Note  Patient: Kaitlin Hudson  Procedure(s) Performed: BILATERAL ABOVE KNEE AMPUTATION (Bilateral Knee)  Patient Location: PACU  Anesthesia Type:General  Level of Consciousness: drowsy, patient cooperative and responds to stimulation  Airway & Oxygen Therapy: Patient Spontanous Breathing and Patient connected to face mask oxygen  Post-op Assessment: Report given to RN and Post -op Vital signs reviewed and stable  Post vital signs: Reviewed and stable  Last Vitals:  Vitals Value Taken Time  BP 126/50 06/04/20 1053  Temp    Pulse 72 06/04/20 1054  Resp 14 06/04/20 1054  SpO2 100 % 06/04/20 1054  Vitals shown include unvalidated device data.  Last Pain:  Vitals:   06/04/20 0758  TempSrc: Oral         Complications: No complications documented.

## 2020-06-04 NOTE — Anesthesia Procedure Notes (Signed)
Procedure Name: Intubation Date/Time: 06/04/2020 10:02 AM Performed by: Shary Decamp, CRNA Pre-anesthesia Checklist: Patient identified, Patient being monitored, Timeout performed, Emergency Drugs available and Suction available Patient Re-evaluated:Patient Re-evaluated prior to induction Oxygen Delivery Method: Circle System Utilized Preoxygenation: Pre-oxygenation with 100% oxygen Induction Type: IV induction Ventilation: Mask ventilation without difficulty Laryngoscope Size: Miller and 2 Grade View: Grade I Tube type: Oral Tube size: 7.5 mm Number of attempts: 1 Airway Equipment and Method: Stylet Placement Confirmation: ETT inserted through vocal cords under direct vision,  positive ETCO2 and breath sounds checked- equal and bilateral Secured at: 21 cm Tube secured with: Tape Dental Injury: Teeth and Oropharynx as per pre-operative assessment

## 2020-06-04 NOTE — Progress Notes (Signed)
Dr. Lajoyce Corners at bedside turned down pressure to bilateral wound vac patient reported that was"tight"  see new orders from Dr. Lajoyce Corners

## 2020-06-04 NOTE — Progress Notes (Signed)
Inpatient Diabetes Program Recommendations  AACE/ADA: New Consensus Statement on Inpatient Glycemic Control (2015)  Target Ranges:  Prepandial:   less than 140 mg/dL      Peak postprandial:   less than 180 mg/dL (1-2 hours)      Critically ill patients:  140 - 180 mg/dL   Lab Results  Component Value Date   GLUCAP 252 (H) 06/04/2020   HGBA1C 6.3 (H) 12/19/2019    Review of Glycemic Control  Diabetes history: DM type 1, Sees Dr. Allena Katz, Endocrinoloist Outpatient Diabetes medications: insulin pump + NPH dose Basal rate 12 AM 1.35, 8 AM 1.6, 7 PM 1.4 Total basal in a 24 hour period  35.4  Current orders for Inpatient glycemic control:  In perioperative area bilateral AKA  Inpatient Diabetes Program Recommendations:    If pt wants her insulin pump inpatient:  - utilize insulin pump order set with CBG checks tid + hs + 2 am.   RN to fill out insulin pump contract and flowsheet at pt bedside and document in the flowsheet area on admission and site assessment Qshoft  If pt does not have insulin pump on consider -   Lantus 30 units -   Novolog 0-15 units tid + hs -   Novolog 3 units tid meal coverage when eating >50% of meals  Thanks,  Christena Deem RN, MSN, BC-ADM Inpatient Diabetes Coordinator Team Pager [phone] (8a-5p)

## 2020-06-04 NOTE — Interval H&P Note (Signed)
History and Physical Interval Note:  06/04/2020 9:30 AM  Kaitlin Hudson  has presented today for surgery, with the diagnosis of Dehiscence Bilateral Below Knee Amputation.  The various methods of treatment have been discussed with the patient and family. After consideration of risks, benefits and other options for treatment, the patient has consented to  Procedure(s): BILATERAL ABOVE KNEE AMPUTATION (Bilateral) as a surgical intervention.  The patient's history has been reviewed, patient examined, no change in status, stable for surgery.  I have reviewed the patient's chart and labs.  Questions were answered to the patient's satisfaction.     Nadara Mustard

## 2020-06-04 NOTE — Anesthesia Preprocedure Evaluation (Addendum)
Anesthesia Evaluation  Patient identified by MRN, date of birth, ID band Patient awake    Reviewed: Allergy & Precautions, H&P , NPO status , Patient's Chart, lab work & pertinent test results  Airway Mallampati: II   Neck ROM: full    Dental   Pulmonary neg pulmonary ROS,    breath sounds clear to auscultation       Cardiovascular hypertension, + CAD, + Peripheral Vascular Disease and +CHF   Rhythm:regular Rate:Normal  Multivessel PCI with cardiologist from [facility].  Recent cardiology visit (05/28/20) with no acute issues.   Neuro/Psych  Headaches, PSYCHIATRIC DISORDERS Anxiety Depression  Neuromuscular disease    GI/Hepatic   Endo/Other  diabetes, Type 1, Insulin Dependent  Renal/GU      Musculoskeletal  (+) Arthritis , Rheumatoid disorders,    Abdominal   Peds  Hematology   Anesthesia Other Findings   Reproductive/Obstetrics                            Anesthesia Physical Anesthesia Plan  ASA: III  Anesthesia Plan: General   Post-op Pain Management:    Induction: Intravenous  PONV Risk Score and Plan: 3 and Ondansetron, Dexamethasone and Treatment may vary due to age or medical condition  Airway Management Planned: Oral ETT  Additional Equipment:   Intra-op Plan:   Post-operative Plan: Extubation in OR  Informed Consent: I have reviewed the patients History and Physical, chart, labs and discussed the procedure including the risks, benefits and alternatives for the proposed anesthesia with the patient or authorized representative who has indicated his/her understanding and acceptance.     Dental advisory given  Plan Discussed with: CRNA, Anesthesiologist and Surgeon  Anesthesia Plan Comments:         Anesthesia Quick Evaluation

## 2020-06-04 NOTE — H&P (Signed)
Kaitlin Hudson is an 65 y.o. female.   Chief Complaint: Bilateral Wound dehiscence Below Knee Amputations HPI:  Patient is a 65 year old woman who is status post bilateral below the knee amputations.  Patient has persistent nonresolving venous and lymphatic insufficiency in both legs with weeping edema she is currently using diapers to collect the fluid.  She states she is unable to use the compressive shrinkers.  She complains of burning pain.  She is 5 months out from surgery.   Past Medical History:  Diagnosis Date  . Anxiety   . Arthritis, rheumatoid (HCC)   . CHF (congestive heart failure) (HCC)    pt denies this dx  . Coronary artery disease   . Depression   . Diabetes mellitus without complication (HCC)   . DVT (deep venous thrombosis) (HCC)    Left leg 10/2019  . Headache   . History of blood transfusion   . Hyperlipidemia   . Hypertension   . Latent tuberculosis    pt denies this dx  . Peripheral vascular disease (HCC)    blood clot in left leg  . Type 1 diabetes [facility])     Past Surgical History:  Procedure Laterality Date  . AMPUTATION Bilateral 12/19/2019   Procedure: BILATERAL BELOW KNEE AMPUTATION;  Surgeon: Nadara Mustard, MD;  Location: [facility] OR;  Service: Orthopedics;  Laterality: Bilateral;  . CARDIAC CATHETERIZATION  11/02/2019  . CARPAL TUNNEL RELEASE Bilateral   . EYE SURGERY Bilateral    09/2018  . FINGER SURGERY  07/11/2014   right middle finger - nodule removed  . FINGER SURGERY  02/20/2017   right middle finger  . FOOT SURGERY Right 02/26/2014   screw  . I & D EXTREMITY Right 08/15/2019   Procedure: RIGHT ACHILLES DEBRIDEMENT, POSSIBLE SKIN GRAFT;  Surgeon: Nadara Mustard, MD;  Location[facility] OR;  Service: Orthopedics;  Laterality: Right;  . JOINT REPLACEMENT    . SHOULDER SURGERY Right 01/06/2005  . TRIGGER FINGER RELEASE  12/202002   left hand -   . WRIST SURGERY Left 10/23/2004   and middle finger  . WRIST SURGERY Right 11/05/2005  . WRIST SURGERY  Right 03/11/2006   cyst removed    No family history on file. Social History:  reports that she has never smoked. She has never used smokeless tobacco. She reports that she does not drink alcohol and does not use drugs.  Allergies:  Allergies  Allergen Reactions  . Aspirin Other (See Comments)    Nose bleed  . Latex Rash  . Other Rash    VINYL   . Cimzia [Certolizumab Pegol] Other (See Comments)    bleeding  . Clindamycin/Lincomycin Diarrhea  . Diclofenac Nausea Only  . Enbrel [Etanercept] Hives  . Erythromycin Other (See Comments)    Severe stomach cramping  . Lasix [Furosemide] Itching  . Leflunomide Other (See Comments)    Fatigue/anxiety  . Rynatan [Chlorpheniramine-Phenyleph Er] Other (See Comments)    Stomach ache   . Statins Other (See Comments)    Myalgias.  . Tramadol Nausea And Vomiting  . Chlorhexidine Rash    CHG wipes  . Keflet [Cephalexin] Rash    Severe stomach cramping  . Penicillins Rash    Reaction: 3-4 years  . Pneumococcal Polysaccharide Vaccine Rash    All over body  . Sulfa Antibiotics Rash    No medications prior to admission.    Results for orders placed or performed during the hospital encounter of 06/02/20 (from the past 48  hour(s))  SARS CORONAVIRUS 2 (TAT 6-24 HRS) Nasopharyngeal Nasopharyngeal Swab     Status: None   Collection Time: 06/02/20  1:29 PM   Specimen: Nasopharyngeal Swab  Result Value Ref Range   SARS Coronavirus 2 NEGATIVE NEGATIVE    Comment: (NOTE) SARS-CoV-2 target nucleic acids are NOT DETECTED.  The SARS-CoV-2 RNA is generally detectable in upper and lower respiratory specimens during the acute phase of infection. Negative results do not preclude SARS-CoV-2 infection, do not rule out co-infections with other pathogens, and should not be used as the sole basis for treatment or other patient management decisions. Negative results must be combined with clinical observations, patient history, and epidemiological  information. The expected result is Negative.  Fact Sheet for Patients: HairSlick.no  Fact Sheet for Healthcare Providers: quierodirigir.com  This test is not yet approved or cleared by the [place] FDA and  has been authorized for detection and/or diagnosis of SARS-CoV-2 by FDA under an Emergency Use Authorization (EUA). This EUA will remain  in effect (meaning this test can be used) for the duration of the COVID-19 declaration under Se ction 564(b)(1) of the Act, 21 U.S.C. section 360bbb-3(b)(1), unless the authorization is terminated or revoked sooner.  Performed at [facility], [address] [place], [place] [address]    No results found.  Review of Systems  All other systems reviewed and are negative.   There were no vitals taken for this visit. Physical Exam  1. S/P bilateral below knee amputation (HCC)   2. Chronic venous hypertension (idiopathic) with ulcer and inflammation of bilateral lower extremity (HCC)    Patient is alert, oriented, no adenopathy, well-dressed, normal affect, normal respiratory effort. Examination patient has massive clear drainage from both legs there is pitting edema in both lower extremities patient states she currently sleeps sitting up with her legs dependent and sits with her legs dependent during the day as well essentially 24 hours a day with her legs dependent.  She states she has burning pain if she tries to use the compressive shrinkers.  There is skin maceration from the drainage.Heart RRR Lungs Clear   Assessment/Plan 1. S/P bilateral below knee amputation (HCC)   2. Chronic venous hypertension (idiopathic) with ulcer and inflammation of bilateral lower extremity (HCC)     Plan: Discussed with the patient with the massive swelling and weeping edema her best treatment option would be to proceed with bilateral above-the-knee amputations.  Plan to reevaluate in 4  weeks she will call us if she wants to proceed with surgery sooner.   [place] Persons, PA 06/04/2020, 6:40 AM

## 2020-06-05 ENCOUNTER — Encounter (HOSPITAL_COMMUNITY): Payer: Self-pay | Admitting: Orthopedic Surgery

## 2020-06-05 LAB — HEMOGLOBIN AND HEMATOCRIT, BLOOD
HCT: 28 % — ABNORMAL LOW (ref 36.0–46.0)
Hemoglobin: 8.6 g/dL — ABNORMAL LOW (ref 12.0–15.0)

## 2020-06-05 LAB — GLUCOSE, CAPILLARY
Glucose-Capillary: 314 mg/dL — ABNORMAL HIGH (ref 70–99)
Glucose-Capillary: 330 mg/dL — ABNORMAL HIGH (ref 70–99)
Glucose-Capillary: 374 mg/dL — ABNORMAL HIGH (ref 70–99)
Glucose-Capillary: 354 mg/dL — ABNORMAL HIGH (ref 70–99)

## 2020-06-05 LAB — CBC
HCT: 21.8 % — ABNORMAL LOW (ref 36.0–46.0)
Hemoglobin: 7 g/dL — ABNORMAL LOW (ref 12.0–15.0)
MCH: 26.1 pg (ref 26.0–34.0)
MCHC: 32.1 g/dL (ref 30.0–36.0)
MCV: 81.3 fL (ref 80.0–100.0)
Platelets: 272 10*3/uL (ref 150–400)
RBC: 2.68 MIL/uL — ABNORMAL LOW (ref 3.87–5.11)
RDW: 18.3 % — ABNORMAL HIGH (ref 11.5–15.5)
WBC: 14.4 10*3/uL — ABNORMAL HIGH (ref 4.0–10.5)
nRBC: 0 % (ref 0.0–0.2)

## 2020-06-05 LAB — PREPARE RBC (CROSSMATCH)

## 2020-06-05 LAB — SURGICAL PATHOLOGY

## 2020-06-05 MED ORDER — INSULIN NPH (HUMAN) (ISOPHANE) 100 UNIT/ML ~~LOC~~ SUSP
18.0000 [IU] | Freq: Every day | SUBCUTANEOUS | Status: DC
  Administered 2020-06-05 – 2020-06-07 (×3): 18 [IU] via SUBCUTANEOUS
  Filled 2020-06-05: qty 10

## 2020-06-05 MED ORDER — OXYCODONE-ACETAMINOPHEN 5-325 MG PO TABS: 1.0000 | ORAL_TABLET | ORAL | 0 refills | Status: DC | PRN

## 2020-06-05 MED ORDER — SODIUM CHLORIDE 0.9 % IV BOLUS
1000.0000 mL | Freq: Once | INTRAVENOUS | Status: AC
  Administered 2020-06-05: 1000 mL via INTRAVENOUS

## 2020-06-05 MED ORDER — SODIUM CHLORIDE 0.9% IV SOLUTION: Freq: Once | INTRAVENOUS | Status: DC

## 2020-06-05 MED ORDER — SODIUM CHLORIDE 0.9% IV SOLUTION: Freq: Once | INTRAVENOUS | Status: AC

## 2020-06-05 MED ORDER — INSULIN PUMP
Freq: Three times a day (TID) | SUBCUTANEOUS | Status: DC
  Administered 2020-06-05: 378 via SUBCUTANEOUS
  Filled 2020-06-05: qty 1

## 2020-06-05 NOTE — Progress Notes (Signed)
Chaplain visited with patient at the request of on-call Chaplain last night. Patient is in a lot of pain and is afraid of going home too soon. She is very emotional as well as her husband, who was bedside. Chaplain sat and listened to patient talk about her son and grandchildren but mostly about her pain. Chaplain offered prayer and spiritual support.    06/05/20 1000  Clinical Encounter Type  Visited With Patient and family together  Visit Type Follow-up  Referral From Chaplain  Consult/Referral To Chaplain  Spiritual Encounters  Spiritual Needs Prayer;Emotional  Stress Factors  Patient Stress Factors Major life changes  Family Stress Factors Major life changes

## 2020-06-05 NOTE — Progress Notes (Signed)
Patient ID: Kaitlin Hudson, female   DOB: 12-Sep-1955, 65 y.o.   MRN: [id number] Patient states she is still having difficulty with pain control.  We will plan for possible discharge tomorrow if her pain is under better control.  There is no drainage in the wound VAC canisters these seem to have a good suction fit currently set at 75 mm of suction.

## 2020-06-05 NOTE — Evaluation (Signed)
Physical Therapy Evaluation Patient Details Name: Kaitlin Hudson MRN: [id number] DOB: [birth year] Today's Date: 06/05/2020   History of Present Illness  Pt is a 65 y.o. female who underwent bilat BKAs to AKA revisions 3/23 due to wound dehiscence/non-healing. PMH consists of bilat BKAs (Oct. 2021), DM type 1, PVD, latent TB, HTN, CAD, CHF, RA, s/p bilat wrist sx, s/p trigger finger release, and s/p R shoulder sx.    Clinical Impression  Pt admitted with above diagnosis. PTA pt mod I mobility at power wheelchair level. On eval, she required +2 max assist supine <> sit, and min assist to maintain sitting balance EOB. Mobility significantly limited by pain and anxiety/fear of falling. Pt will benefit from skilled PT to increase their independence and safety with mobility to allow discharge to the venue listed below. Suspect pt will progress quickly with mobility as pain is managed and anxiety subsides. Husband is very supportive and able to provide 24-hour assist at home.      Follow Up Recommendations Home health PT;Supervision/Assistance - 24 hour    Equipment Recommendations  None recommended by PT    Recommendations for Other Services       Precautions / Restrictions Precautions Precautions: Fall;Other (comment) Precaution Comments: BLE wound vacs, insulin pump Restrictions RLE Weight Bearing: Non weight bearing LLE Weight Bearing: Non weight bearing      Mobility  Bed Mobility Overal bed mobility: Needs Assistance Bed Mobility: Supine to Sit;Sit to Supine     Supine to sit: +2 for physical assistance;Max assist;HOB elevated Sit to supine: HOB elevated;Max assist;+2 for physical assistance   General bed mobility comments: assist for all aspects of mobility. Pt able to assist by pulling up with RUE on bed rail. Increased assist level needed due to pain and fear of falling.    Transfers                 General transfer comment: unable to progress beyond  EOB  Ambulation/Gait             General Gait Details: nonambulatory at baseline  Stairs            Wheelchair Mobility    Modified Rankin (Stroke Patients Only)       Balance Overall balance assessment: Needs assistance Sitting-balance support: Bilateral upper extremity supported Sitting balance-Leahy Scale: Poor Sitting balance - Comments: min assist to maintain sitting balance EOB                                     Pertinent Vitals/Pain Pain Assessment: 0-10 Pain Score: 8  Pain Location: BLE Pain Descriptors / Indicators: Burning;Grimacing;Guarding;Squeezing;Pressure Pain Intervention(s): Limited activity within patient's tolerance;Repositioned;Monitored during session    Home Living Family/patient expects to be discharged to:: Private residence Living Arrangements: Spouse/significant other Available Help at Discharge: Family;Available 24 hours/day Type of Home: House Home Access: Ramped entrance     Home Layout: One level Home Equipment: Walker - 2 wheels;Bedside commode;Wheelchair - Engineer, technical sales - power;Tub bench Additional Comments: spouse very supportive and has been assisting with medical care     Prior Function Level of Independence: Needs assistance   Gait / Transfers Assistance Needed: mod I using slide board vs lateral scoot transfer  ADL's / Homemaking Assistance Needed: spouse assists with dressing and bathing  Comments: Has w/c transport van.     Hand Dominance   Dominant Hand: Right    Extremity/Trunk Assessment  Upper Extremity Assessment Upper Extremity Assessment: Defer to OT evaluation    Lower Extremity Assessment Lower Extremity Assessment: RLE deficits/detail;LLE deficits/detail RLE Deficits / Details: s/p AKA, wound vac in place LLE Deficits / Details: s/p AKA, wound vac in place       Communication   Communication: No difficulties  Cognition Arousal/Alertness: Awake/alert Behavior During  Therapy: Anxious Overall Cognitive Status: Within Functional Limits for tasks assessed                                 General Comments: increased fear of falling      General Comments General comments (skin integrity, edema, etc.): Pt on 2L O2.    Exercises     Assessment/Plan    PT Assessment Patient needs continued PT services  PT Problem List Decreased mobility;Decreased activity tolerance;Decreased balance;Pain       PT Treatment Interventions Therapeutic activities;DME instruction;Therapeutic exercise;Patient/family education;Balance training;Functional mobility training    PT Goals (Current goals can be found in the Care Plan section)  Acute Rehab PT Goals Patient Stated Goal: home PT Goal Formulation: With patient/family Time For Goal Achievement: 06/19/20 Potential to Achieve Goals: Good    Frequency Min 4X/week   Barriers to discharge        Co-evaluation               AM-PAC PT "6 Clicks" Mobility  Outcome Measure Help needed turning from your back to your side while in a flat bed without using bedrails?: A Lot Help needed moving from lying on your back to sitting on the side of a flat bed without using bedrails?: A Lot Help needed moving to and from a bed to a chair (including a wheelchair)?: A Lot Help needed standing up from a chair using your arms (e.g., wheelchair or bedside chair)?: Total Help needed to walk in hospital room?: Total Help needed climbing 3-5 steps with a railing? : Total 6 Click Score: 9    End of Session Equipment Utilized During Treatment: Oxygen Activity Tolerance: Other (comment) (anxiety) Patient left: in bed;with call bell/phone within reach;with bed alarm set;with family/visitor present Nurse Communication: Mobility status PT Visit Diagnosis: Other abnormalities of gait and mobility (R26.89);Pain    Time: 9201-0071 PT Time Calculation (min) (ACUTE ONLY): 23 min   Charges:   PT Evaluation $PT Eval  Moderate Complexity: 1 Mod          Aida Raider, PT  Office # [phone] Pager [phone]   Ilda Foil 06/05/2020, 10:52 AM

## 2020-06-05 NOTE — Evaluation (Signed)
Occupational Therapy Evaluation Patient Details Name: Kaitlin Hudson MRN: [id number] DOB: [birth year] Today's Date: 06/05/2020    History of Present Illness Pt is a 65 y.o. female who underwent bilat BKAs to AKA revisions 3/23 due to wound dehiscence/non-healing. PMH consists of bilat BKAs (Oct. 2021), DM type 1, PVD, latent TB, HTN, CAD, CHF, RA, s/p bilat wrist sx, s/p trigger finger release, and s/p R shoulder sx.   Clinical Impression   PTA, pt was living at home with her husband, pt reports she was modified independent to independent with most  ADL, requiring assistance from husband for LB dressing and shower transfers. Pt reports she was independent with transfers to/from her power w/c and the w/c fits throughout the house. Pt currently requires maxA+2 for bed mobility and minA for stability sitting EOB. Pt unable to tolerate further mobility progression secondary to pain level and appearing anxious with sitting EOB. Anticipate pt will progress quickly with mobility and independence with ADL as pain is managed and anxiety reduces. Pt will have great support from her husband at d/c.  At this time, recommend HHOT follow-up. Will continue to follow acutely.     Follow Up Recommendations  Home health OT;Supervision/assistance -24hour   Equipment Recommendations  None recommended by OT    Recommendations for Other Services       Precautions / Restrictions Precautions Precautions: Fall;Other (comment) Precaution Comments: BLE wound vacs, insulin pump Restrictions Weight Bearing Restrictions: Yes RLE Weight Bearing: Non weight bearing LLE Weight Bearing: Non weight bearing      Mobility Bed Mobility Overal bed mobility: Needs Assistance Bed Mobility: Supine to Sit;Sit to Supine     Supine to sit: +2 for physical assistance;Max assist;HOB elevated Sit to supine: HOB elevated;Max assist;+2 for physical assistance   General bed mobility comments: assist for all aspects of mobility.  Pt able to assist by pulling up with RUE on bed rail. Increased assist level needed due to pain and fear of falling.    Transfers                 General transfer comment: unable to progress beyond EOB    Balance Overall balance assessment: Needs assistance Sitting-balance support: Bilateral upper extremity supported Sitting balance-Leahy Scale: Poor Sitting balance - Comments: min assist to maintain sitting balance EOB                                   ADL either performed or assessed with clinical judgement   ADL Overall ADL's : Needs assistance/impaired Eating/Feeding: Independent;Sitting   Grooming: Set up;Sitting;Bed level   Upper Body Bathing: Moderate assistance   Lower Body Bathing: Maximal assistance;Sitting/lateral leans   Upper Body Dressing : Moderate assistance;Sitting   Lower Body Dressing: Maximal assistance;Sitting/lateral leans     Toilet Transfer Details (indicate cue type and reason): deferred   Toileting - Clothing Manipulation Details (indicate cue type and reason): deferred       General ADL Comments: pt tolerated sitting EOB about 15 minutes this session;pt with increased fear of falling and axious while sitting EOB required max cues for pursed lip breathing;pt with heavy reliance on BUE support for stability     Vision         Perception     Praxis      Pertinent Vitals/Pain Pain Assessment: 0-10 Pain Score: 8  Pain Location: BLE Pain Descriptors / Indicators: Burning;Grimacing;Guarding;Squeezing;Pressure Pain Intervention(s): Limited activity within  patient's tolerance;Monitored during session     Hand Dominance Right   Extremity/Trunk Assessment Upper Extremity Assessment Upper Extremity Assessment: Overall WFL for tasks assessed;RUE deficits/detail;LUE deficits/detail RUE Deficits / Details: pt reports pain in shoulders and hands secondary to arthritis, limited shoulder ROM but otherwise WFL RUE Sensation:  WNL RUE Coordination: WNL LUE Deficits / Details: pt reports pain in shoulders and hands secondary to arthritis, limited shoulder ROM but otherwise WFL;noted increased edema LUE Sensation: WNL LUE Coordination: WNL   Lower Extremity Assessment Lower Extremity Assessment: RLE deficits/detail;LLE deficits/detail;Defer to PT evaluation RLE Deficits / Details: s/p AKA, wound vac in place LLE Deficits / Details: s/p AKA, wound vac in place       Communication Communication Communication: No difficulties   Cognition Arousal/Alertness: Awake/alert Behavior During Therapy: Anxious Overall Cognitive Status: Within Functional Limits for tasks assessed                                 General Comments: increased fear of falling   General Comments  Pt on 2L/min via [place] at rest SpO2 >98% difficult to get good wave form during bed mobiltiy    Exercises     Shoulder Instructions      Home Living Family/patient expects to be discharged to:: Private residence Living Arrangements: Spouse/significant other Available Help at Discharge: Family;Available 24 hours/day Type of Home: House Home Access: Ramped entrance     Home Layout: One level     Bathroom Shower/Tub: Producer, television/film/video: Standard Bathroom Accessibility: Yes How Accessible: Accessible via wheelchair Home Equipment: Walker - 2 wheels;Bedside commode;Wheelchair - Engineer, technical sales - power;Tub bench   Additional Comments: spouse very supportive and has been assisting with medical care       Prior Functioning/Environment Level of Independence: Needs assistance  Gait / Transfers Assistance Needed: mod I using slide board vs lateral scoot transfer ADL's / Homemaking Assistance Needed: spouse assists with dressing and bathing   Comments: Has w/c transport van.        OT Problem List: Decreased activity tolerance;Impaired balance (sitting and/or standing);Pain      OT Treatment/Interventions:  Self-care/ADL training;Therapeutic exercise;Energy conservation;DME and/or AE instruction;Patient/family education;Balance training    OT Goals(Current goals can be found in the care plan section) Acute Rehab OT Goals Patient Stated Goal: to go home and return to prior level of independence OT Goal Formulation: With patient Time For Goal Achievement: 06/19/20 Potential to Achieve Goals: Good ADL Goals Pt Will Perform Grooming: with set-up;sitting Pt Will Perform Upper Body Dressing: with set-up;sitting Pt Will Transfer to Toilet: with min guard assist;with transfer board;bedside commode Pt Will Perform Tub/Shower Transfer: Shower transfer;shower seat;with min guard assist;with transfer board  OT Frequency: Min 2X/week   Barriers to D/C:            Co-evaluation PT/OT/SLP Co-Evaluation/Treatment: Yes Reason for Co-Treatment: Complexity of the patient's impairments (multi-system involvement);For patient/therapist safety;To address functional/ADL transfers   OT goals addressed during session: ADL's and self-care      AM-PAC OT "6 Clicks" Daily Activity     Outcome Measure Help from another person eating meals?: None Help from another person taking care of personal grooming?: A Little Help from another person toileting, which includes using toliet, bedpan, or urinal?: Total Help from another person bathing (including washing, rinsing, drying)?: A Lot Help from another person to put on and taking off regular upper body clothing?: A Lot Help from  another person to put on and taking off regular lower body clothing?: A Lot 6 Click Score: 14   End of Session Equipment Utilized During Treatment: Oxygen Nurse Communication: Mobility status  Activity Tolerance: Patient limited by pain;Patient tolerated treatment well Patient left: in bed;with call bell/phone within reach;with bed alarm set;with family/visitor present  OT Visit Diagnosis: Unsteadiness on feet (R26.81);Other abnormalities  of gait and mobility (R26.89);Pain Pain - Right/Left:  (bilateral) Pain - part of body: Leg                Time: 0947-0962 OT Time Calculation (min): 31 min Charges:  OT General Charges $OT Visit: 1 Visit OT Evaluation $OT Eval Moderate Complexity: 1 Mod  Aksel Bencomo OTR/L Acute Rehabilitation Services Office: [phone]   Rebeca Alert 06/05/2020, 11:01 AM

## 2020-06-05 NOTE — Progress Notes (Signed)
    Penelope Galas, RN  Registered Nurse    Progress Notes     Signed  Date of Service:  06/05/2020 9:12 AM           Signed         Show:Clear all [x] Manual[] Template[] Copied  Added by: [x] , RN   [] Hover for details  Patient was crying very upset, depressed, saying cant do anything for herself anymore. Nurse given emotional support and suggesting if pt wanted to talk to doctor about it and may give her something to take to help with it. Patient yelling at nurse you dont give me something to knock me out. Her husband said no she meant something to help you with your anxiety, patient yelling louder and pointed finger at her husband you dont do that to me I want to hear and talk, I dont want anything.            Note Details  Author , RN File Time 06/05/2020 10:02 AM  Author Type Registered Nurse Status Signed  Last Editor , RN Service (none)  Hospital Acct # Penelope Galas Admit Date 06/01/2020

## 2020-06-05 NOTE — Anesthesia Postprocedure Evaluation (Signed)
Anesthesia Post Note  Patient: Vaunda Gutterman  Procedure(s) Performed: BILATERAL ABOVE KNEE AMPUTATION (Bilateral Knee) APPLICATION OF WOUND VAC (Bilateral Leg Upper)     Patient location during evaluation: PACU Anesthesia Type: General Level of consciousness: awake and alert Pain management: pain level controlled Vital Signs Assessment: post-procedure vital signs reviewed and stable Respiratory status: spontaneous breathing, nonlabored ventilation, respiratory function stable and patient connected to nasal cannula oxygen Cardiovascular status: blood pressure returned to baseline and stable Postop Assessment: no apparent nausea or vomiting Anesthetic complications: no   No complications documented.  Last Vitals:  Vitals:   06/05/20 0614 06/05/20 0804  BP: (!) 95/45 (!) 105/36  Pulse: 88 98  Resp:  16  Temp:  37 C  SpO2:  97%    Last Pain:  Vitals:   06/05/20 0846  TempSrc:   PainSc: 10-Worst pain ever                 Dimond Crotty S

## 2020-06-05 NOTE — Progress Notes (Addendum)
Inpatient Diabetes Program Recommendations  AACE/ADA: New Consensus Statement on Inpatient Glycemic Control (2015)  Target Ranges:  Prepandial:   less than 140 mg/dL      Peak postprandial:   less than 180 mg/dL (1-2 hours)      Critically ill patients:  140 - 180 mg/dL   Lab Results  Component Value Date   GLUCAP 314 (H) 06/05/2020   HGBA1C 7.1 (H) 06/04/2020    Review of Glycemic Control  Diabetes history: DM type 1, Sees Dr. Allena Katz, Endocrinoloist Outpatient Diabetes medications: insulin pump + NPH 18 units q hs Basal rate 12 AM 1.35, 8 AM 1.6, 7 PM 1.4 Total basal in a 24 hour period  35.4  Current orders for Inpatient glycemic control:  In perioperative area bilateral AKA  Inpatient Diabetes Program Recommendations:    Received consult regarding placing insulin pump orders. Spoke with RN [name].  - utilize insulin pump order set with CBG checks tid + hs + 2 am.   RN to fill out insulin pump contract and flowsheet at pt bedside and document in the flowsheet site assessment Qshift  If pt does not have insulin pump on consider -   Lantus 30 units -   Novolog 0-15 units tid + hs -   Novolog 3 units tid meal coverage when eating >50% of meals  -Discontinue Novolog correction scale if patient is using her insulin pump. Patient will administer Novolog corrections via pump. -Increase NPH to 18 units (home dose q hs)  Spoke with patient and husband @ bedside. Verified patient takes NPH 18 units q hs @ home in addition to insulin pump.  Thank you, Kaitlin Hudson. Kaitlin Gillison, RN, MSN, CDE  Diabetes Coordinator Inpatient Glycemic Control Team Team Pager [phone] (8am-5pm) 06/05/2020 10:30 AM

## 2020-06-05 NOTE — Progress Notes (Signed)
Chaplain happened to meet pt's husband on elevator and realized he was lost.  As Chaplain was escorting husband to correct elevator to return to wife he began to offer up the trials of his wife's condition.  Chaplain offered comfort as he began to cry. Chpalain offered a ministry of presence as husband told of his wife's two-year journey and how hard it has been for him to watch her suffer.   Chaplain prayed with pt's husband in the hallway.  Shortly thereafter Chaplain visited with pt and husband in pt's room.  Pt expressed being ready to go home to be with her Shaune Pollack and hoped he would take her soon as she had no quality of life left and wanted the pain and suffering to end.  Chaplain prayed with pt at bedside.  Pt requested Chaplain pray for her husband who is grieving.  Chaplain prayed for comfort and strengtrh for husband.  Vernell Morgans Chaplain

## 2020-06-05 NOTE — Plan of Care (Signed)
  Problem: Safety: Goal: Ability to remain free from injury will improve Outcome: Progressing   Problem: Pain Managment: Goal: General experience of comfort will improve Outcome: Progressing   

## 2020-06-05 NOTE — Plan of Care (Signed)

## 2020-06-05 NOTE — Progress Notes (Signed)
Patient is postop day 1 status post bilateral above-knee amputations.  She is laying in bed comfortable with her husband.  States have some labile blood pressures last night.   Current hemoglobin is 7 and 21.  Patient is alert pleasant.  Patient was seen by Dr. Lajoyce Corners.  Will work with physical therapy today patient has a motorized wheelchair and a Film/video editor.  She has been utilizing this as a below-knee amputee bilaterally.  Would like to be discharged to home tomorrow to the care of her husband

## 2020-06-06 LAB — BPAM RBC
Blood Product Expiration Date: 202204242359
ISSUE DATE / TIME: 202203241051
Unit Type and Rh: 5100

## 2020-06-06 LAB — CBC
HCT: 26.2 % — ABNORMAL LOW (ref 36.0–46.0)
Hemoglobin: 8.2 g/dL — ABNORMAL LOW (ref 12.0–15.0)
MCH: 25.9 pg — ABNORMAL LOW (ref 26.0–34.0)
MCHC: 31.3 g/dL (ref 30.0–36.0)
MCV: 82.6 fL (ref 80.0–100.0)
Platelets: 220 10*3/uL (ref 150–400)
RBC: 3.17 MIL/uL — ABNORMAL LOW (ref 3.87–5.11)
RDW: 19.2 % — ABNORMAL HIGH (ref 11.5–15.5)
WBC: 19.8 10*3/uL — ABNORMAL HIGH (ref 4.0–10.5)
nRBC: 0 % (ref 0.0–0.2)

## 2020-06-06 LAB — TYPE AND SCREEN
ABO/RH(D): O POS
Antibody Screen: NEGATIVE
Unit division: 0

## 2020-06-06 LAB — GLUCOSE, CAPILLARY
Glucose-Capillary: 99 mg/dL (ref 70–99)
Glucose-Capillary: 39 mg/dL — CL (ref 70–99)
Glucose-Capillary: 48 mg/dL — ABNORMAL LOW (ref 70–99)
Glucose-Capillary: 49 mg/dL — ABNORMAL LOW (ref 70–99)
Glucose-Capillary: 143 mg/dL — ABNORMAL HIGH (ref 70–99)
Glucose-Capillary: 91 mg/dL (ref 70–99)
Glucose-Capillary: 93 mg/dL (ref 70–99)

## 2020-06-06 MED ORDER — DEXTROSE 50 % IV SOLN
INTRAVENOUS | Status: AC
  Administered 2020-06-06: 25 g via INTRAVENOUS
  Filled 2020-06-06: qty 50

## 2020-06-06 MED ORDER — INSULIN ASPART 100 UNIT/ML ~~LOC~~ SOLN
4.0000 [IU] | Freq: Three times a day (TID) | SUBCUTANEOUS | Status: DC
  Administered 2020-06-08 (×2): 4 [IU] via SUBCUTANEOUS

## 2020-06-06 MED ORDER — INSULIN ASPART 100 UNIT/ML ~~LOC~~ SOLN
0.0000 [IU] | Freq: Three times a day (TID) | SUBCUTANEOUS | Status: DC
  Administered 2020-06-07: 2 [IU] via SUBCUTANEOUS
  Administered 2020-06-07: 5 [IU] via SUBCUTANEOUS
  Administered 2020-06-08: 11 [IU] via SUBCUTANEOUS
  Administered 2020-06-08: 8 [IU] via SUBCUTANEOUS

## 2020-06-06 MED ORDER — DEXTROSE 50 % IV SOLN: 25.0000 g | INTRAVENOUS | Status: AC

## 2020-06-06 NOTE — Progress Notes (Signed)
Rehab Admissions Coordinator Note:  Patient was screened by Clois Dupes for appropriateness for an Inpatient Acute Rehab Consult per change in therapy recs. Today.   At this time, we are recommending Inpatient Rehab consult. I will contact [place] PA, and Dr. Lajoyce Corners  for possible CIR consult.  Clois Dupes RN MSN 06/06/2020, 11:55 AM  I can be reached at [phone].

## 2020-06-06 NOTE — Progress Notes (Signed)
Physical Therapy Treatment Patient Details Name: Kaitlin Hudson MRN: [id number] DOB: [birth year] Today's Date: 06/06/2020    History of Present Illness Pt is a 65 y.o. female who underwent bilat BKAs to AKA revisions 3/23 due to wound dehiscence/non-healing. PMH consists of bilat BKAs (Oct. 2021), DM type 1, PVD, latent TB, HTN, CAD, CHF, RA, s/p bilat wrist sx, s/p trigger finger release, and s/p R shoulder sx.    PT Comments    Pt is not progressing towards home as quickly as pt and evaluating therapist had hoped.  See in conjunction with OT today working on even upright positioning tolerance (not even fully sitting), position adjustments and pressure relief.  We reviewed her home set up, sleeping situation, equipment, husband's ability to help (he can help some, but has a bulging disc and cannot lift her much).  She and husband are agreeable that      Follow Up Recommendations  CIR     Equipment Recommendations  Hospital bed;Other (comment) (hoyer lift)    Recommendations for Other Services Rehab consult     Precautions / Restrictions Precautions Precautions: Fall;Other (comment) Precaution Comments: BLE wound vacs, insulin pump Restrictions RLE Weight Bearing: Non weight bearing LLE Weight Bearing: Non weight bearing    Mobility  Bed Mobility Overal bed mobility: Needs Assistance Bed Mobility: Supine to Sit;Rolling Rolling: +2 for physical assistance;Mod assist   Supine to sit: +2 for physical assistance;Mod assist;HOB elevated     General bed mobility comments: Pt able to initiate partial roll and partial long sitting with bil rails and support from both therapists at trunk, but reaches a point where her pain and pressure on bil legs is too much and she has to stop.  We did elevate HOB to meet her trunk and to stay upright for quite a bit of time >20 mins during the session and encouraged her to work on getting this upright with the [facility] fully elevated several times throughout  the day to start getting her body used to the pressure.    Transfers                 General transfer comment: unable, husband to bring her power scooter tomorrow as well as her slide board in preparation for attempts at transfer.  Ambulation/Gait                 Stairs             Wheelchair Mobility    Modified Rankin (Stroke Patients Only)       Balance Overall balance assessment: Needs assistance Sitting-balance support: Bilateral upper extremity supported Sitting balance-Leahy Scale: Poor Sitting balance - Comments: two person mod assist to support trunk while pt pulled on bil bed rails to come to partial long sitting.  Unable to get fully upright to try to prop on bil UEs due to pain and pressure at bil legs.                                    Cognition Arousal/Alertness: Awake/alert Behavior During Therapy: Anxious Overall Cognitive Status: Within Functional Limits for tasks assessed                                 General Comments: increased fear of falling, pain anticipation      Exercises  General Comments General comments (skin integrity, edema, etc.): Encouraged not only bringing the bed up multiple times per day, but also attempting partial rolls from side to side (even if not complete) for pressure relief and UE exercising. Discussed at length with pt and her husband our concerns of her going directly home.  She is not able to even sit upright at this time and will need help with balance now that she is an AKA.  She was agreeable for [place] to ask for an inpatient rehab screen.      Pertinent Vitals/Pain Pain Assessment: Faces Faces Pain Scale: Hurts whole lot Pain Location: BLE Pain Descriptors / Indicators: Burning;Grimacing;Guarding;Squeezing;Pressure Pain Intervention(s): Limited activity within patient's tolerance;Monitored during session;Repositioned    Home Living                       Prior Function            PT Goals (current goals can now be found in the care plan section) Acute Rehab PT Goals Patient Stated Goal: to go home and return to prior level of independence Progress towards PT goals: Progressing toward goals    Frequency    Min 4X/week      PT Plan Discharge plan needs to be updated    Co-evaluation PT/OT/SLP Co-Evaluation/Treatment: Yes Reason for Co-Treatment: Complexity of the patient's impairments (multi-system involvement);Necessary to address cognition/behavior during functional activity;For patient/therapist safety;To address functional/ADL transfers          AM-PAC PT "6 Clicks" Mobility   Outcome Measure  Help needed turning from your back to your side while in a flat bed without using bedrails?: A Lot Help needed moving from lying on your back to sitting on the side of a flat bed without using bedrails?: A Lot Help needed moving to and from a bed to a chair (including a wheelchair)?: Total Help needed standing up from a chair using your arms (e.g., wheelchair or bedside chair)?: Total Help needed to walk in hospital room?: Total Help needed climbing 3-5 steps with a railing? : Total 6 Click Score: 8    End of Session   Activity Tolerance: Patient limited by pain Patient left: in bed;with call bell/phone within reach;with family/visitor present Nurse Communication: Other (comment) (IV infusion complete) PT Visit Diagnosis: Other abnormalities of gait and mobility (R26.89);Pain     Time: 7412-8786 PT Time Calculation (min) (ACUTE ONLY): 40 min  Charges:  $Therapeutic Activity: 8-22 mins                     Corinna Capra, PT, DPT  Acute Rehabilitation [phone] pager [phone]) [phone] office

## 2020-06-06 NOTE — Progress Notes (Signed)
Occupational Therapy Treatment Patient Details Name: Kaitlin Hudson MRN: [id number] DOB: 07-Feb-1956 Today's Date: 06/06/2020    History of present illness Pt is a 65 y.o. female who underwent bilat BKAs to AKA revisions 3/23 due to wound dehiscence/non-healing. PMH consists of bilat BKAs (Oct. 2021), DM type 1, PVD, latent TB, HTN, CAD, CHF, RA, s/p bilat wrist sx, s/p trigger finger release, and s/p R shoulder sx.   OT comments  Pt in bed upon arrival, agreeable to session. Pt currently requires moderate assistance +2 for bed mobility. Pt limited to sitting upright in modified long sitting with support of HOB secondary to pain with increased pressure in BLE, increased anxiety with mobility. Educated pt on importance of bed mobility and progressing tolerance for sitting upright. Pt's husband reports he will be limited with ability to assist physically as he has a bulging disc. Pt and husband agreeable to CIR d/c to progress pt with independence in ADL, further mobility, activity tolerance and pain management. Pt will continue to benefit from skilled OT services to maximize safety and independence with ADL/IADL and functional mobility. Will continue to follow acutely and progress as tolerated.     Follow Up Recommendations  CIR    Equipment Recommendations  None recommended by OT    Recommendations for Other Services      Precautions / Restrictions Precautions Precautions: Fall;Other (comment) Precaution Comments: BLE wound vacs, insulin pump Restrictions Weight Bearing Restrictions: Yes RLE Weight Bearing: Non weight bearing LLE Weight Bearing: Non weight bearing       Mobility Bed Mobility Overal bed mobility: Needs Assistance Bed Mobility: Supine to Sit;Rolling Rolling: +2 for physical assistance;Mod assist   Supine to sit: +2 for physical assistance;Mod assist;HOB elevated     General bed mobility comments: Pt able to initiate partial roll and partial long sitting with bil  rails and support from both therapists at trunk, but reaches a point where her pain and pressure on bil legs is too much and she has to stop.  We did elevate HOB to meet her trunk and to stay upright for quite a bit of time >20 mins during the session and encouraged her to work on getting this upright with the [facility] fully elevated several times throughout the day to start getting her body used to the pressure.    Transfers                 General transfer comment: unable, husband to bring her power scooter tomorrow as well as her slide board in preparation for attempts at transfer.    Balance Overall balance assessment: Needs assistance Sitting-balance support: Bilateral upper extremity supported Sitting balance-Leahy Scale: Poor Sitting balance - Comments: two person mod assist to support trunk while pt pulled on bil bed rails to come to partial long sitting.  Unable to get fully upright to try to prop on bil UEs due to pain and pressure at bil legs.                                   ADL either performed or assessed with clinical judgement   ADL Overall ADL's : Needs assistance/impaired Eating/Feeding: Independent;Sitting   Grooming: Set up;Sitting;Bed level               Lower Body Dressing: Maximal assistance;Sitting/lateral leans                 General ADL Comments:  limited by generalized deconditioning, pain, anxiety, mobility limitations secondary to bilat AKA     Vision       Perception     Praxis      Cognition Arousal/Alertness: Awake/Hudson Behavior During Therapy: Anxious Overall Cognitive Status: Within Functional Limits for tasks assessed                                 General Comments: increased fear of falling, pain anticipation        Exercises     Shoulder Instructions       General Comments Encouraged not only bringing the bed up multiple times per day, but also attempting partial rolls from side to  side (even if not complete) for pressure relief and UE exercising. Discussed at length with pt and her husband our concerns of her going directly home.  She is not able to even sit upright at this time and will need help with balance now that she is an AKA.  She was agreeable for [place] to ask for an inpatient rehab screen.    Pertinent Vitals/ Pain       Pain Assessment: Faces Faces Pain Scale: Hurts whole lot Pain Location: BLE Pain Descriptors / Indicators: Burning;Grimacing;Guarding;Squeezing;Pressure Pain Intervention(s): Limited activity within patient's tolerance;Monitored during session  Home Living                                          Prior Functioning/Environment              Frequency  Min 2X/week        Progress Toward Goals  OT Goals(current goals can now be found in the care plan section)  Progress towards OT goals: Progressing toward goals  Acute Rehab OT Goals Patient Stated Goal: to go home and return to prior level of independence OT Goal Formulation: With patient Time For Goal Achievement: 06/19/20 Potential to Achieve Goals: Good ADL Goals Pt Will Perform Grooming: with set-up;sitting Pt Will Perform Upper Body Dressing: with set-up;sitting Pt Will Transfer to Toilet: with min guard assist;with transfer board;bedside commode Pt Will Perform Tub/Shower Transfer: Shower transfer;shower seat;with min guard assist;with transfer board  Plan Discharge plan needs to be updated    Co-evaluation    PT/OT/SLP Co-Evaluation/Treatment: Yes Reason for Co-Treatment: Complexity of the patient's impairments (multi-system involvement);For patient/therapist safety;To address functional/ADL transfers   OT goals addressed during session: ADL's and self-care      AM-PAC OT "6 Clicks" Daily Activity     Outcome Measure   Help from another person eating meals?: None Help from another person taking care of personal grooming?: A Little Help from  another person toileting, which includes using toliet, bedpan, or urinal?: Total Help from another person bathing (including washing, rinsing, drying)?: A Lot Help from another person to put on and taking off regular upper body clothing?: A Lot Help from another person to put on and taking off regular lower body clothing?: A Lot 6 Click Score: 14    End of Session Equipment Utilized During Treatment: Oxygen  OT Visit Diagnosis: Unsteadiness on feet (R26.81);Other abnormalities of gait and mobility (R26.89);Pain Pain - Right/Left:  (bilateral) Pain - part of body: Leg   Activity Tolerance Patient limited by pain;Patient tolerated treatment well   Patient Left in bed;with call bell/phone within reach;with bed  alarm set;with family/visitor present   Nurse Communication Mobility status        Time: 6010-9323 OT Time Calculation (min): 38 min  Charges: OT General Charges $OT Visit: 1 Visit OT Treatments $Self Care/Home Management : 23-37 mins  Kaitlin Hudson OTR/L Acute Rehabilitation Services Office: [phone]    Kaitlin Hudson 06/06/2020, 2:20 PM

## 2020-06-06 NOTE — Plan of Care (Signed)
Patient A&Ox4. VS Stable. Weaned to RA.  Experiencing anxiety, frustration and hopelessness. Provided emotional support and reassurance.

## 2020-06-06 NOTE — Progress Notes (Addendum)
At 1120 patient blood sugar was 39. Patient is awake and alert. Patient shows no signs of distress.  Hypoglycemia protocol was follow. Blood sugar repeated twice and blood sugar was 48 and 49. Attending physician was notified. New orders was placed. Nurse followed new orders patient blood sugar is now 143. Patient was educated on the signs and symptoms of hypoglycemia.   Patient refused to take Eliquis this morning.Patient stated at cardiologist appointment that the doctor said to come off of the Eliquis and to only take 81mg  of aspirin. Patient was educated on the importance of anticoagulation after a blood clot. Patient verbalize understanding the benefits, but still wants to come off of it. Spoke with attending physician . Eliquis order is now discontinue.

## 2020-06-06 NOTE — Progress Notes (Signed)
During change of shift, night staff noticed patient has Purewick in place already.  Nurse educated patient on the importance of moving even if it is just turning to get on the bed pan.  Patient adamantly refused to have Purewick removed.  Patient stated "Physical therapy will work with me tomorrow.  You can remove it then".

## 2020-06-06 NOTE — Progress Notes (Addendum)
Results for Kaitlin Hudson, Kaitlin Hudson (MRN [id number]) as of 06/06/2020 14:23  Ref. Range 06/06/2020 06:45 06/06/2020 11:20 06/06/2020 11:45 06/06/2020 12:16 06/06/2020 13:09  Glucose-Capillary Latest Ref Range: 70 - 99 mg/dL 99 39 (LL) 48 (L) 49 (L) 143 (H)  Spoke to patient in regards to her insulin pump and low blood sugar.   Patient states that she changed her site this morning and said that the site catheter on the right side of her abdomen was kinked and not delivering the right amount of insulin. Noted that patient received Decadron 5 mg in surgery and is probably clearing her system now. States that she did take a bolus this morning for the CBG of 99 mg/dl since she was getting Prednisone. She thinks that is why she went low to 39 mg/dl at 1884 am. Patient normally would take 7 units for a blood sugar of 99 mg/dl at home, 16-60 units for blood sugar of 174 mg/dl, and 63-01 units for blood sugar >300 mg/dl.  Told patient that Dr. Lajoyce Corners had ordered Novolog 0-15 units correction scale TID  and Novolog 4 units TID, along with the NPH 18 units at HS. If the insulin pump is stopped, she states that she would need to save the insulin that she has because she is only allowed so much insulin per month with insurance. Patient states that she would prefer to stay on her insulin pump while in the hospital.  Will continue to monitor blood sugar trends while in the hospital.  ADDENDUM: spoke with Dr. Lajoyce Corners on the phone about the insulin pump. He would like patient to remove pump due to her pain and inadequate diet intake and follow the insulin orders as written.  Spoke with patient about the conversation with Dr. Lajoyce Corners and suggested that the insulin reservoir can be taken out and put in the refrigerator. Husband needs to take insulin pump home when removed.    Smith Mince RN BSN CDE Diabetes Coordinator Pager: [phone]  8am-5pm

## 2020-06-06 NOTE — Progress Notes (Signed)
Patient ID: Kaitlin Hudson, female   DOB: 10/11/1955, 65 y.o.   MRN: [id number] Patient states that she is having a lot of anxiety with the wound VAC dressing so she states she cannot move her legs she feels like she has a vice around her legs.  There is no drainage in the wound VAC canister is we will have the wound vacs removed today apply a Mepilex or Aquacel dressing without a wrap.

## 2020-06-07 LAB — CBC WITH DIFFERENTIAL/PLATELET
Abs Immature Granulocytes: 0.2 10*3/uL — ABNORMAL HIGH (ref 0.00–0.07)
Basophils Absolute: 0 10*3/uL (ref 0.0–0.1)
Basophils Relative: 0 %
Eosinophils Absolute: 0.1 10*3/uL (ref 0.0–0.5)
Eosinophils Relative: 0 %
HCT: 24.3 % — ABNORMAL LOW (ref 36.0–46.0)
Hemoglobin: 7.6 g/dL — ABNORMAL LOW (ref 12.0–15.0)
Immature Granulocytes: 1 %
Lymphocytes Relative: 5 %
Lymphs Abs: 0.8 10*3/uL (ref 0.7–4.0)
MCH: 25.9 pg — ABNORMAL LOW (ref 26.0–34.0)
MCHC: 31.3 g/dL (ref 30.0–36.0)
MCV: 82.7 fL (ref 80.0–100.0)
Monocytes Absolute: 1.2 10*3/uL — ABNORMAL HIGH (ref 0.1–1.0)
Monocytes Relative: 7 %
Neutro Abs: 14.7 10*3/uL — ABNORMAL HIGH (ref 1.7–7.7)
Neutrophils Relative %: 87 %
Platelets: 215 10*3/uL (ref 150–400)
RBC: 2.94 MIL/uL — ABNORMAL LOW (ref 3.87–5.11)
RDW: 19.9 % — ABNORMAL HIGH (ref 11.5–15.5)
WBC: 17.1 10*3/uL — ABNORMAL HIGH (ref 4.0–10.5)
nRBC: 0 % (ref 0.0–0.2)

## 2020-06-07 LAB — GLUCOSE, CAPILLARY
Glucose-Capillary: 112 mg/dL — ABNORMAL HIGH (ref 70–99)
Glucose-Capillary: 143 mg/dL — ABNORMAL HIGH (ref 70–99)
Glucose-Capillary: 224 mg/dL — ABNORMAL HIGH (ref 70–99)
Glucose-Capillary: 248 mg/dL — ABNORMAL HIGH (ref 70–99)

## 2020-06-07 MED ORDER — MAGNESIUM CITRATE PO SOLN
1.0000 | Freq: Once | ORAL | Status: AC
  Administered 2020-06-07: 1 via ORAL
  Filled 2020-06-07: qty 296

## 2020-06-07 MED ORDER — ENSURE ENLIVE PO LIQD
237.0000 mL | Freq: Two times a day (BID) | ORAL | Status: DC
  Administered 2020-06-07 – 2020-06-08 (×2): 237 mL via ORAL

## 2020-06-07 NOTE — Progress Notes (Signed)
Inpatient Rehab Admissions Coordinator:   I spoke with rehab MD regarding potential CIR admit and he requested repeat CBC prior to admission to CIR. Per. Dr. Lajoyce Corners, no current work up is needed for elevated WBCs. Will plan for potential admit tomorrow, pending results of CBC. Pt. And husband are aware and gave consent to admit to CIR once medically cleared.   Megan Salon, MS, CCC-SLP Rehab Admissions Coordinator  [phone] (celll) [phone] (office)

## 2020-06-07 NOTE — PMR Pre-admission (Signed)
PMR Admission Coordinator Pre-Admission Assessment  Patient: Kaitlin Hudson is an 65 y.o., female MRN: [id number] DOB: [birth year] Height:  (129.5 cm) Weight: 69.4 kg   Insurance Information HMO:     PPO:      PCP:      IPA:      80/20:      OTHER:  PRIMARY: Medicare a and b      Policy#: [id number]      Subscriber: pt Benefits:  Phone #: passport one online     Name: 10/8 Eff. Date: a 09/13/2018 and b 03/16/2019     Deduct: $1484      Out of Pocket Max: none      Life Max: none CIR: 100%      SNF: 20 full days Outpatient: 805     Co-Pay: 20% Home Health: 100%      Co-Pay: none DME: 80%     Co-Pay: 20% Providers: pt choice  SECONDARY: BCBS supplement      Policy#: [id number]  Financial Counselor:    Phone#:   The Engineer, materials Information Summary" for patients in Inpatient Rehabilitation Facilities with attached "Privacy Act Statement-Health Care Records" was provided and verbally reviewed with: Patient and Family  Emergency Contact Information Contact Information    Name Relation Home Work Mobile   Fredia, [place] Spouse [phone]  [phone]      Current Medical History  Patient Admitting Diagnosis: Bilateral AKAs  History of Present Illness: Pt. Is a   65 year old right-handed female history ofRA with chronic prednisone,diastolic congestive heart failure, [facility] August 2021 underwent rotational atherectomy assisted multivessel PCI maintained on Plavix and Eliquis x 12 months,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 Pt. Underwent bilateral transtibial amputation due to gangrenous ischemic ulcers to BLEs in 12/19/2019 followed by a stint on CIR. Pt.'s husband reports that she was min A with ADLs and independent with slideboard transfers from her power wheelchair following admission to CIR.  She llives with spouse in a1 level  home with ramped entrance. Pt. And husband report that surgical  Pt readmitted to [facility] 06/04/2020 due to bilateral wound dehiscence. Patient presented with persistent nonresolving venous and lymphatic insufficiency in both legs with weeping edema.  She states she is unable to use the compressive shrinkers. Pt. Underwent bilateral AKAs with bilateral wound vac placement  by Dr. Lajoyce Corners with orthopedic surgery on 06/04/20.  Following surgery, Pt. Experienced some ABL anemia with Hgb of 7 on 3.24, improved to 8.6 later that day.  Pt. Complained of discomfort from wound vacs, so Dr. Lajoyce Corners d/c'd order for wound vacs on 3/25.    Patient's medical record from [facility] has been reviewed by the rehabilitation admission coordinator and physician.  Past Medical History  Past Medical History:  Diagnosis Date  . Anxiety   . Arthritis, rheumatoid (HCC)   . Coronary artery disease   . Depression   . Diabetes mellitus without complication (HCC)   . DVT (deep venous thrombosis) (HCC)    Left leg 10/2019  . Headache   . History of blood transfusion   . Hyperlipidemia   . Hypertension   . Latent tuberculosis    pt denies this dx  . Peripheral vascular disease (HCC)    blood clot in left leg  . Type 1 diabetes (HCC)     Family History   family history is not on file.  Prior Rehab/Hospitalizations Has the patient had prior rehab or hospitalizations prior to admission? Yes  Has the patient had major surgery during 100 days prior to admission? Yes   Current Medications  Current Facility-Administered Medications:  .  0.9 %  sodium chloride infusion (Manually program via Guardrails IV Fluids), , Intravenous, Once, Persons, [place], [place] .  0.9 %  sodium chloride infusion, , Intravenous, Continuous, Persons, [place], [place], Last Rate: 75 mL/hr at 06/07/20 0109, New Bag at 06/07/20 0109 .  acetaminophen (TYLENOL) tablet 325-650 mg, 325-650 mg, Oral, Q6H PRN, Persons, [place], [place], 650  mg at 06/06/20 2105 .  aspirin EC tablet 81 mg, 81 mg, Oral, Daily, Persons, [place], [place], 81 mg at 06/07/20 3967 .  cholecalciferol (VITAMIN D3) tablet 1,000 Units, 1,000 Units, Oral, Daily, Persons, [place], [place], 1,000 Units at 06/07/20 [phone] .  clopidogrel (PLAVIX) tablet 75 mg, 75 mg, Oral, Daily, Persons, [place], [place], 75 mg at 06/07/20 9150 .  docusate sodium (COLACE) capsule 100 mg, 100 mg, Oral, BID, Persons, [place], [place], 100 mg at 06/07/20 2114 .  feeding supplement (ENSURE ENLIVE / ENSURE PLUS) liquid 237 mL, 237 mL, Oral, BID BM, Nadara Mustard, MD, 237 mL at 06/07/20 1756 .  HYDROmorphone (DILAUDID) injection 0.5 mg, 0.5 mg, Intravenous, Q4H PRN, Persons, [place], [place], 0.5 mg at 06/07/20 1116 .  insulin aspart (novoLOG) injection 0-15 Units, 0-15 Units, Subcutaneous, TID WC, Nadara Mustard, MD, 5 Units at 06/07/20 1754 .  insulin aspart (novoLOG) injection 4 Units, 4 Units, Subcutaneous, TID WC, Nadara Mustard, MD .  insulin NPH Human (NOVOLIN N) injection 18 Units, 18 Units, Subcutaneous, QHS, Persons, [place], [place], [place] Units at 06/07/20 2115 .  lisinopril (ZESTRIL) tablet 40 mg, 40 mg, Oral, Daily, Persons, [place], [place], 40 mg at 06/07/20 4136 .  metoCLOPramide (REGLAN) tablet 5-10 mg, 5-10 mg, Oral, Q8H PRN **OR** metoCLOPramide (REGLAN) injection 5-10 mg, 5-10 mg, Intravenous, Q8H PRN, Persons, [place], [place] .  metoprolol tartrate (LOPRESSOR) tablet 12.5 mg, 12.5 mg, Oral, BID, Persons, [place], [place], 12.5 mg at 06/07/20 2114 .  ondansetron (ZOFRAN) tablet 4 mg, 4 mg, Oral, Q6H PRN **OR** ondansetron (ZOFRAN) injection 4 mg, 4 mg, Intravenous, Q6H PRN, Persons, [place], [place] .  oxyCODONE (Oxy IR/ROXICODONE) immediate release tablet 5-10 mg, 5-10 mg, Oral, Q4H PRN, Persons, [place], PA, 10 mg at 06/07/20 2136 .  predniSONE (DELTASONE) tablet 12.5 mg, 12.5 mg, Oral, Daily, Persons, [place], [place], 12.5 mg at 06/07/20 4383 .  predniSONE (DELTASONE) tablet 2.5 mg, 2.5 mg, Oral,  QHS, Nadara Mustard, MD, 2.5 mg at 06/07/20 2114  Patients Current Diet:  Diet Order            Diet - low sodium heart healthy           Diet Carb Modified Fluid consistency: Thin; Room service appropriate? Yes  Diet effective now           Diet - low sodium heart healthy                 Precautions / Restrictions Precautions Precautions: Fall,Other (comment) Precaution Comments: BLE wound vacs, insulin pump Restrictions Weight Bearing Restrictions: Yes RLE Weight Bearing: Non weight bearing LLE Weight Bearing: Non weight bearing   Has the patient had 2 or more falls or a fall with injury in the past year? No  Prior Activity Level Limited Community (1-2x/wk): went out for appts  Prior Functional Level Self Care: Did the patient need help bathing, dressing, using the toilet or eating? Needed some help  Indoor Mobility: Did the patient need assistance with walking from room to room (with or without device)? Pt. Is non-ambulatory, uses power wheenchair  Stairs: Did the patient need assistance with internal or external stairs (with or without device)? Pt. Uses power wheelchiar  Functional Cognition: Did the patient need help planning regular tasks such as shopping or remembering to take medications? Needed some help  Home Assistive Devices / Equipment Home Assistive Devices/Equipment: Built-in shower seat,Wheelchair,Tub transfer bench Home Equipment: Walker - 2 wheels,Bedside commode,Wheelchair - manual,Wheelchair - power,Tub bench  Prior Device Use: Indicate devices/aids used by the patient prior to current illness, exacerbation or injury? Motorized wheelchair or scooter  Current Functional Level Cognition  Overall Cognitive Status: Within Functional Limits for tasks assessed Orientation Level: Oriented X4 General Comments: increased fear of falling, pain anticipation    Extremity Assessment (includes Sensation/Coordination)  Upper Extremity Assessment: Overall WFL  for tasks assessed RUE Deficits / Details: pt reports pain in shoulders and hands secondary to arthritis, limited shoulder ROM but otherwise WFL RUE Sensation: WNL RUE Coordination: WNL LUE Deficits / Details: pt reports pain in shoulders and hands secondary to arthritis, limited shoulder ROM but otherwise WFL;noted increased edema LUE Sensation: WNL LUE Coordination: WNL  Lower Extremity Assessment: Defer to PT evaluation RLE Deficits / Details: s/p AKA, wound vac in place LLE Deficits / Details: s/p AKA, wound vac in place    ADLs  Overall ADL's : Needs assistance/impaired Eating/Feeding: Independent,Sitting Grooming: Set up,Sitting,Bed level Upper Body Bathing: Moderate assistance Lower Body Bathing: Maximal assistance,Sitting/lateral leans Upper Body Dressing : Moderate assistance,Sitting Lower Body Dressing: Maximal assistance,Sitting/lateral leans Toilet Transfer Details (indicate cue type and reason): deferred Toileting - Clothing Manipulation Details (indicate cue type and reason): deferred General ADL Comments: limited by generalized deconditioning, pain, anxiety, mobility limitations secondary to bilat AKA    Mobility  Overal bed mobility: Needs Assistance Bed Mobility: Supine to Sit,Sit to Supine Rolling: +2 for physical assistance,Max assist Supine to sit: Mod assist,+2 for physical assistance Sit to supine: HOB elevated,Max assist,+2 for physical assistance General bed mobility comments: Pt performed rolling to R side for pericare and pad placement.  Pt required cues for hand placement and assistance to rotate hips during rolling.    Transfers  Overall transfer level: Needs assistance Equipment used: Sliding board Transfers: Lateral/Scoot Transfers  Lateral/Scoot Transfers: Mod assist,+2 physical assistance General transfer comment: Pt required support to keep her upright and avoid posterior bias.  PTA utilized bed pad to scoot from surface to surface.  Cues for  patient hand placement to assist with scooting.  Once in power chair and to scoot posterior with B arms.  In chair reviewed pressure relief techniques.    Ambulation / Gait / Stairs / Wheelchair Mobility  Ambulation/Gait Ambulation/Gait assistance:  (unable.  B AKA) General Gait Details: nonambulatory at baseline Wheelchair Mobility Wheelchair mobility: Yes Wheelchair propulsion: Right upper extremity Wheelchair parts: Needs assistance    Posture / Balance Dynamic Sitting Balance Sitting balance - Comments: Posterior bias Balance Overall balance assessment: Needs assistance Sitting-balance support: Bilateral upper extremity supported Sitting balance-Leahy Scale: Poor Sitting balance - Comments: Posterior bias    Special needs/care consideration Skin surgical incisions and Special service needs Pt. with power wheelchair from home: Diabetic Management: DM2 managed with sQ Novolog and Novolin N   Previous Home Environment (from acute therapy documentation) Living Arrangements: Spouse/significant other  Available Help at Discharge: Family,Available 24 hours/day Type of Home: House Home Layout: One level Home Access: Ramped entrance Bathroom Shower/Tub: Health visitor: Standard Bathroom Accessibility: Yes How Accessible: Accessible via wheelchair Home Care Services: No Additional Comments: spouse very supportive and has been assisting with medical care   Discharge Living Setting Plans for Discharge Living Setting: Patient's home Type of Home at Discharge: House Discharge Home Layout: One level Discharge Home Access: Ramped entrance Discharge Bathroom Shower/Tub: Walk-in shower Discharge Bathroom Toilet: Handicapped height Discharge Bathroom Accessibility: Yes How Accessible: Accessible via wheelchair Does the patient have any problems obtaining your medications?: No  Social/Family/Support Systems Patient Roles: Spouse Contact Information:  [phone] Anticipated Caregiver: Roberta Angell Anticipated Caregiver's Contact Information: [phone] Ability/Limitations of Caregiver: Can provide min A Caregiver Availability: 24/7 Discharge Plan Discussed with Primary Caregiver: Yes Is Caregiver In Agreement with Plan?: Yes Does Caregiver/Family have Issues with Lodging/Transportation while Pt is in Rehab?: No  Goals Patient/Family Goal for Rehab: PT/OT Min A, wheelchair level Expected length of stay: 14-18 days Pt/Family Agrees to Admission and willing to participate: Yes Program Orientation Provided & Reviewed with Pt/Caregiver Including Roles  & Responsibilities: Yes  Decrease burden of Care through IP rehab admission: Patient/family education  Possible need for SNF placement upon discharge: not anticipated   Patient Condition: I have reviewed medical records from [facility], spoken with CM, and patient and spouse. I discussed via phone for inpatient rehabilitation assessment.  Patient will benefit from ongoing PT and OT, can actively participate in 3 hours of therapy a day 5 days of the week, and can make measurable gains during the admission.  Patient will also benefit from the coordinated team approach during an Inpatient Acute Rehabilitation admission.  The patient will receive intensive therapy as well as Rehabilitation physician, nursing, social worker, and care management interventions.  Due to safety, skin/wound care, disease management, medication administration and pain management the patient requires 24 hour a day rehabilitation nursing.  The patient is currently mod+2 A  with mobility and basic ADLs.  Discharge setting and therapy post discharge at home with home health is anticipated.  Patient has agreed to participate in the Acute Inpatient Rehabilitation Program and will admit today.  Preadmission Screen Completed By:  Jeronimo Greaves, 06/08/2020 8:44  AM ______________________________________________________________________   Discussed status with Dr. Allena Katz on 06/08/20  at 800 and received approval for admission today.  Admission Coordinator:  Jeronimo Greaves, CCC-SLP, time 800 Date 06/08/20   Assessment/Plan: Diagnosis: Bilateral AKAs due to wound dehiscence  1. Does the need for close, 24 hr/day Medical supervision in concert with the patient's rehab needs make it unreasonable for this patient to be served in a less intensive setting? Yes  2. Co-Morbidities requiring supervision/potential complications: RA with chronic prednisone,diastolic congestive heart failure, CAD,CKD, hypertension ,hyperlipidemia, diabetes mellituswith insulin pump and peripheral neuropathy.  3. Due to bowel management, safety, skin/wound care, disease management, pain management and patient education, does the patient require 24 hr/day rehab nursing? Yes 4. Does the patient require coordinated care of a physician, rehab nurse, PT, OT to address physical and functional deficits in the context of the above medical diagnosis(es)? Yes Addressing deficits in the following areas: balance, endurance, locomotion, strength, transferring, bathing, toileting and psychosocial support 5. Can the patient actively participate in an intensive therapy program of at least 3 hrs of therapy 5 days a week? Potentially 6. The potential for patient to make measurable gains while on inpatient rehab is  excellent and good 7. Anticipated functional outcomes upon discharge from inpatient rehab: Min/Mod A at wheelchair level PT, Min/Mod A at wheelchair level OT, n/a SLP 8. Estimated rehab length of stay to reach the above functional goals is: 8-12 days. 9. Anticipated discharge destination: Home 10. Overall Rehab/Functional Prognosis: good and fair   MD Signature: Maryla Morrow, MD, ABPMR

## 2020-06-07 NOTE — Progress Notes (Signed)
Patient ID: Kaitlin Hudson, female   DOB: 06-Dec-1955, 65 y.o.   MRN: [id number] Patient states she is feeling better but is having difficulty eating.  Orders written for Ensure or Ensure Plus orders written for overhead frame and trapeze to assist with her mobility.  Awaiting consultation with inpatient rehab.  Order written for magnesium citrate.

## 2020-06-07 NOTE — Progress Notes (Signed)
Inpatient Rehab Admissions Coordinator:    I spoke with Pt's husband over the phone (with Pt. In room) about potential CIR admit. He states that she did well in previous CIR stay and would like to return. Pt. Has not participated much with therapies to date, but Pt.'s husband brought in her power wheelchair and PT plans to attempt transfers to power chair today. Prior to admission, Pt. Was mostly independent at wheelchair level. I will follow for potential admission to CIR pending participation and progress with therapies.   Megan Salon, MS, CCC-SLP Rehab Admissions Coordinator  [phone] (celll) [phone] (office)

## 2020-06-07 NOTE — Progress Notes (Signed)
Physical Therapy Treatment Patient Details Name: Kaitlin Hudson MRN: [id number] DOB: Aug 20, 1955 Today's Date: 06/07/2020    History of Present Illness Pt is a 65 y.o. female who underwent bilat BKAs to AKA revisions 3/23 due to wound dehiscence/non-healing. PMH consists of bilat BKAs (Oct. 2021), DM type 1, PVD, latent TB, HTN, CAD, CHF, RA, s/p bilat wrist sx, s/p trigger finger release, and s/p R shoulder sx.    PT Comments    Pt supine in bed on arrival this session.  Pt premedicated this session and tolerated transfer OOB to power WC.  In power wheel chair able to maintain balance and perform 250 ft in power chair.  Pt continues to benefit from CIR rehab to maximize functional gains and decreased caregiver burden.  Educated patient to sit up in chair x 1 hour and educated on pressure relief techniques.      Follow Up Recommendations  CIR     Equipment Recommendations  Hospital bed    Recommendations for Other Services Rehab consult     Precautions / Restrictions Precautions Precautions: Fall;Other (comment) Precaution Comments: BLE wound vacs, insulin pump Restrictions Weight Bearing Restrictions: Yes RLE Weight Bearing: Non weight bearing LLE Weight Bearing: Non weight bearing    Mobility  Bed Mobility Overal bed mobility: Needs Assistance Bed Mobility: Supine to Sit;Sit to Supine Rolling: +2 for physical assistance;Max assist   Supine to sit: Mod assist;+2 for physical assistance     General bed mobility comments: Pt performed rolling to R side for pericare and pad placement.  Pt required cues for hand placement and assistance to rotate hips during rolling.    Transfers Overall transfer level: Needs assistance Equipment used: Sliding board Transfers: Lateral/Scoot Transfers          Lateral/Scoot Transfers: Mod assist;+2 physical assistance General transfer comment: Pt required support to keep her upright and avoid posterior bias.  PTA utilized bed pad to scoot  from surface to surface.  Cues for patient hand placement to assist with scooting.  Once in power chair and to scoot posterior with B arms.  In chair reviewed pressure relief techniques.  Ambulation/Gait Ambulation/Gait assistance:  (unable.  B AKA)               Stairs             Wheelchair Mobility    Modified Rankin (Stroke Patients Only)       Balance     Sitting balance-Leahy Scale: Poor Sitting balance - Comments: Posterior bias                                    Cognition Arousal/Alertness: Awake/alert Behavior During Therapy: Anxious Overall Cognitive Status: Within Functional Limits for tasks assessed                                 General Comments: increased fear of falling, pain anticipation      Exercises      General Comments        Pertinent Vitals/Pain Pain Assessment: Faces Faces Pain Scale: Hurts even more Pain Location: BLE Pain Descriptors / Indicators: Burning;Grimacing;Guarding;Squeezing;Pressure Pain Intervention(s): Monitored during session;Repositioned    Home Living                      Prior Function  PT Goals (current goals can now be found in the care plan section) Acute Rehab PT Goals Patient Stated Goal: to go home and return to prior level of independence Potential to Achieve Goals: Good Progress towards PT goals: Progressing toward goals    Frequency    Min 4X/week      PT Plan Current plan remains appropriate    Co-evaluation              AM-PAC PT "6 Clicks" Mobility   Outcome Measure  Help needed turning from your back to your side while in a flat bed without using bedrails?: A Lot Help needed moving from lying on your back to sitting on the side of a flat bed without using bedrails?: A Lot Help needed moving to and from a bed to a chair (including a wheelchair)?: A Lot Help needed standing up from a chair using your arms (e.g., wheelchair  or bedside chair)?: Total Help needed to walk in hospital room?: Total Help needed climbing 3-5 steps with a railing? : Total 6 Click Score: 9    End of Session Equipment Utilized During Treatment:  (slide board and bed pan.) Activity Tolerance: Patient limited by pain Patient left: in bed;with call bell/phone within reach;with family/visitor present Nurse Communication: Mobility status PT Visit Diagnosis: Other abnormalities of gait and mobility (R26.89);Pain     Time: 0017-4944 PT Time Calculation (min) (ACUTE ONLY): 28 min  Charges:  $Therapeutic Activity: 23-37 mins                     Bonney Leitz , PTA Acute Rehabilitation Services Pager [phone] Office [phone]     Kaitlin Hudson Artis Delay 06/07/2020, 10:49 AM

## 2020-06-07 NOTE — Plan of Care (Signed)
  Problem: Activity: Goal: Risk for activity intolerance will decrease Outcome: Progressing   Problem: Nutrition: Goal: Adequate nutrition will be maintained Outcome: Not Progressing   Problem: Pain Managment: Goal: General experience of comfort will improve Outcome: Progressing   

## 2020-06-07 NOTE — Progress Notes (Signed)
Inpatient Rehab Admissions Coordinator:   I potentially have a CIR bed for Patient this weekend but note WBCs trending up. I have reached out to Dr. Lajoyce Corners to discuss any pending work up and confirm that Pt. Is medically ready for CIR.   Megan Salon, MS, CCC-SLP Rehab Admissions Coordinator  [phone] (celll) [phone] (office)

## 2020-06-08 ENCOUNTER — Encounter (HOSPITAL_COMMUNITY): Payer: Self-pay | Admitting: Physical Medicine & Rehabilitation

## 2020-06-08 ENCOUNTER — Inpatient Hospital Stay (HOSPITAL_COMMUNITY)
Admission: RE | Admit: 2020-06-08 | Discharge: 2020-06-20 | DRG: 560 | Disposition: A | Discharge status: Home Health | Payer: Medicare Other | Source: Intra-hospital | Attending: Physical Medicine & Rehabilitation | Admitting: Physical Medicine & Rehabilitation

## 2020-06-08 ENCOUNTER — Other Ambulatory Visit: Payer: Self-pay

## 2020-06-08 DIAGNOSIS — K5901 Slow transit constipation: Secondary | ICD-10-CM | POA: Diagnosis present

## 2020-06-08 DIAGNOSIS — E1065 Type 1 diabetes mellitus with hyperglycemia: Secondary | ICD-10-CM | POA: Diagnosis present

## 2020-06-08 DIAGNOSIS — E10649 Type 1 diabetes mellitus with hypoglycemia without coma: Secondary | ICD-10-CM | POA: Diagnosis not present

## 2020-06-08 DIAGNOSIS — I5032 Chronic diastolic (congestive) heart failure: Secondary | ICD-10-CM | POA: Diagnosis present

## 2020-06-08 DIAGNOSIS — F32A Depression, unspecified: Secondary | ICD-10-CM | POA: Diagnosis present

## 2020-06-08 DIAGNOSIS — Z888 Allergy status to other drugs, medicaments and biological substances status: Secondary | ICD-10-CM

## 2020-06-08 DIAGNOSIS — D62 Acute posthemorrhagic anemia: Secondary | ICD-10-CM | POA: Diagnosis present

## 2020-06-08 DIAGNOSIS — T8781 Dehiscence of amputation stump: Principal | ICD-10-CM

## 2020-06-08 DIAGNOSIS — F419 Anxiety disorder, unspecified: Secondary | ICD-10-CM | POA: Diagnosis present

## 2020-06-08 DIAGNOSIS — Z6841 Body Mass Index (BMI) 40.0 and over, adult: Secondary | ICD-10-CM | POA: Diagnosis not present

## 2020-06-08 DIAGNOSIS — I739 Peripheral vascular disease, unspecified: Secondary | ICD-10-CM | POA: Diagnosis not present

## 2020-06-08 DIAGNOSIS — I2510 Atherosclerotic heart disease of native coronary artery without angina pectoris: Secondary | ICD-10-CM | POA: Diagnosis present

## 2020-06-08 DIAGNOSIS — E1051 Type 1 diabetes mellitus with diabetic peripheral angiopathy without gangrene: Secondary | ICD-10-CM | POA: Diagnosis present

## 2020-06-08 DIAGNOSIS — Z86718 Personal history of other venous thrombosis and embolism: Secondary | ICD-10-CM

## 2020-06-08 DIAGNOSIS — Z89611 Acquired absence of right leg above knee: Secondary | ICD-10-CM

## 2020-06-08 DIAGNOSIS — G8918 Other acute postprocedural pain: Secondary | ICD-10-CM | POA: Diagnosis present

## 2020-06-08 DIAGNOSIS — Z89612 Acquired absence of left leg above knee: Secondary | ICD-10-CM | POA: Diagnosis not present

## 2020-06-08 DIAGNOSIS — Z882 Allergy status to sulfonamides status: Secondary | ICD-10-CM

## 2020-06-08 DIAGNOSIS — E108 Type 1 diabetes mellitus with unspecified complications: Secondary | ICD-10-CM | POA: Diagnosis not present

## 2020-06-08 DIAGNOSIS — Z79899 Other long term (current) drug therapy: Secondary | ICD-10-CM

## 2020-06-08 DIAGNOSIS — F411 Generalized anxiety disorder: Secondary | ICD-10-CM | POA: Diagnosis present

## 2020-06-08 DIAGNOSIS — N182 Chronic kidney disease, stage 2 (mild): Secondary | ICD-10-CM | POA: Diagnosis present

## 2020-06-08 DIAGNOSIS — M069 Rheumatoid arthritis, unspecified: Secondary | ICD-10-CM | POA: Diagnosis present

## 2020-06-08 DIAGNOSIS — E785 Hyperlipidemia, unspecified: Secondary | ICD-10-CM

## 2020-06-08 DIAGNOSIS — D72829 Elevated white blood cell count, unspecified: Secondary | ICD-10-CM

## 2020-06-08 DIAGNOSIS — Z7902 Long term (current) use of antithrombotics/antiplatelets: Secondary | ICD-10-CM

## 2020-06-08 DIAGNOSIS — N179 Acute kidney failure, unspecified: Secondary | ICD-10-CM | POA: Diagnosis present

## 2020-06-08 DIAGNOSIS — Z9641 Presence of insulin pump (external) (internal): Secondary | ICD-10-CM | POA: Diagnosis present

## 2020-06-08 DIAGNOSIS — Z4781 Encounter for orthopedic aftercare following surgical amputation: Principal | ICD-10-CM

## 2020-06-08 DIAGNOSIS — L89322 Pressure ulcer of left buttock, stage 2: Secondary | ICD-10-CM | POA: Diagnosis not present

## 2020-06-08 DIAGNOSIS — I130 Hypertensive heart and chronic kidney disease with heart failure and stage 1 through stage 4 chronic kidney disease, or unspecified chronic kidney disease: Secondary | ICD-10-CM | POA: Diagnosis present

## 2020-06-08 DIAGNOSIS — Z887 Allergy status to serum and vaccine status: Secondary | ICD-10-CM

## 2020-06-08 DIAGNOSIS — E1022 Type 1 diabetes mellitus with diabetic chronic kidney disease: Secondary | ICD-10-CM | POA: Diagnosis present

## 2020-06-08 DIAGNOSIS — E1169 Type 2 diabetes mellitus with other specified complication: Secondary | ICD-10-CM | POA: Diagnosis not present

## 2020-06-08 DIAGNOSIS — Z7952 Long term (current) use of systemic steroids: Secondary | ICD-10-CM

## 2020-06-08 DIAGNOSIS — E559 Vitamin D deficiency, unspecified: Secondary | ICD-10-CM

## 2020-06-08 DIAGNOSIS — Z886 Allergy status to analgesic agent status: Secondary | ICD-10-CM

## 2020-06-08 DIAGNOSIS — Z7901 Long term (current) use of anticoagulants: Secondary | ICD-10-CM

## 2020-06-08 DIAGNOSIS — K5903 Drug induced constipation: Secondary | ICD-10-CM

## 2020-06-08 DIAGNOSIS — T8130XA Disruption of wound, unspecified, initial encounter: Secondary | ICD-10-CM

## 2020-06-08 DIAGNOSIS — Z881 Allergy status to other antibiotic agents status: Secondary | ICD-10-CM

## 2020-06-08 DIAGNOSIS — Z880 Allergy status to penicillin: Secondary | ICD-10-CM

## 2020-06-08 DIAGNOSIS — Z794 Long term (current) use of insulin: Secondary | ICD-10-CM

## 2020-06-08 DIAGNOSIS — I10 Essential (primary) hypertension: Secondary | ICD-10-CM

## 2020-06-08 DIAGNOSIS — Z7982 Long term (current) use of aspirin: Secondary | ICD-10-CM

## 2020-06-08 DIAGNOSIS — Z91040 Latex allergy status: Secondary | ICD-10-CM

## 2020-06-08 LAB — GLUCOSE, CAPILLARY
Glucose-Capillary: 279 mg/dL — ABNORMAL HIGH (ref 70–99)
Glucose-Capillary: 303 mg/dL — ABNORMAL HIGH (ref 70–99)
Glucose-Capillary: 146 mg/dL — ABNORMAL HIGH (ref 70–99)

## 2020-06-08 MED ORDER — BISACODYL 10 MG RE SUPP: 10.0000 mg | Freq: Every day | RECTAL | Status: DC | PRN

## 2020-06-08 MED ORDER — CLOPIDOGREL BISULFATE 75 MG PO TABS
75.0000 mg | ORAL_TABLET | Freq: Every day | ORAL | Status: DC
  Administered 2020-06-09 – 2020-06-20 (×12): 75 mg via ORAL
  Filled 2020-06-08 (×12): qty 1

## 2020-06-08 MED ORDER — PREDNISONE 2.5 MG PO TABS
12.5000 mg | ORAL_TABLET | Freq: Every day | ORAL | Status: DC
  Administered 2020-06-09 – 2020-06-20 (×12): 12.5 mg via ORAL
  Filled 2020-06-08 (×13): qty 1

## 2020-06-08 MED ORDER — INSULIN ASPART 100 UNIT/ML ~~LOC~~ SOLN: 4.0000 [IU] | Freq: Three times a day (TID) | SUBCUTANEOUS | 11 refills | Status: AC

## 2020-06-08 MED ORDER — DOCUSATE SODIUM 100 MG PO CAPS
100.0000 mg | ORAL_CAPSULE | Freq: Two times a day (BID) | ORAL | Status: DC
  Administered 2020-06-08 – 2020-06-20 (×22): 100 mg via ORAL
  Filled 2020-06-08 (×24): qty 1

## 2020-06-08 MED ORDER — METOPROLOL TARTRATE 12.5 MG HALF TABLET
12.5000 mg | ORAL_TABLET | Freq: Two times a day (BID) | ORAL | Status: DC
Start: 2020-06-08 — End: 2020-06-20
  Administered 2020-06-08 – 2020-06-20 (×24): 12.5 mg via ORAL
  Filled 2020-06-08 (×24): qty 1

## 2020-06-08 MED ORDER — ENSURE ENLIVE PO LIQD: 237.0000 mL | Freq: Two times a day (BID) | ORAL | Status: DC

## 2020-06-08 MED ORDER — ONDANSETRON HCL 4 MG/2ML IJ SOLN: 4.0000 mg | Freq: Four times a day (QID) | INTRAMUSCULAR | Status: DC | PRN

## 2020-06-08 MED ORDER — VITAMIN D 25 MCG (1000 UNIT) PO TABS
1000.0000 [IU] | ORAL_TABLET | Freq: Every day | ORAL | Status: DC
  Administered 2020-06-09 – 2020-06-20 (×12): 1000 [IU] via ORAL
  Filled 2020-06-08 (×12): qty 1

## 2020-06-08 MED ORDER — OXYCODONE HCL 5 MG PO TABS
10.0000 mg | ORAL_TABLET | ORAL | Status: DC | PRN
  Administered 2020-06-08 – 2020-06-20 (×20): 10 mg via ORAL
  Filled 2020-06-08 (×22): qty 2

## 2020-06-08 MED ORDER — METHOCARBAMOL 500 MG PO TABS
500.0000 mg | ORAL_TABLET | Freq: Four times a day (QID) | ORAL | Status: DC | PRN
  Administered 2020-06-09: 500 mg via ORAL
  Filled 2020-06-08: qty 1

## 2020-06-08 MED ORDER — INSULIN ASPART 100 UNIT/ML ~~LOC~~ SOLN: 0.0000 [IU] | Freq: Three times a day (TID) | SUBCUTANEOUS | 11 refills | Status: AC

## 2020-06-08 MED ORDER — INSULIN ASPART 100 UNIT/ML ~~LOC~~ SOLN
4.0000 [IU] | Freq: Three times a day (TID) | SUBCUTANEOUS | Status: DC
  Administered 2020-06-08 – 2020-06-19 (×23): 4 [IU] via SUBCUTANEOUS

## 2020-06-08 MED ORDER — ONDANSETRON HCL 4 MG PO TABS
4.0000 mg | ORAL_TABLET | Freq: Four times a day (QID) | ORAL | Status: DC | PRN
  Filled 2020-06-08: qty 1

## 2020-06-08 MED ORDER — BUMETANIDE 2 MG PO TABS
2.0000 mg | ORAL_TABLET | Freq: Every day | ORAL | Status: DC
  Filled 2020-06-08: qty 1

## 2020-06-08 MED ORDER — INSULIN ASPART 100 UNIT/ML ~~LOC~~ SOLN
0.0000 [IU] | Freq: Three times a day (TID) | SUBCUTANEOUS | Status: DC
  Administered 2020-06-08: 2 [IU] via SUBCUTANEOUS
  Administered 2020-06-09 (×3): 5 [IU] via SUBCUTANEOUS
  Administered 2020-06-10: 15 [IU] via SUBCUTANEOUS
  Administered 2020-06-10: 3 [IU] via SUBCUTANEOUS
  Administered 2020-06-10: 15 [IU] via SUBCUTANEOUS
  Administered 2020-06-11 (×3): 5 [IU] via SUBCUTANEOUS
  Administered 2020-06-12: 3 [IU] via SUBCUTANEOUS
  Administered 2020-06-12: 11 [IU] via SUBCUTANEOUS
  Administered 2020-06-12: 5 [IU] via SUBCUTANEOUS
  Administered 2020-06-13: 8 [IU] via SUBCUTANEOUS
  Administered 2020-06-13: 3 [IU] via SUBCUTANEOUS
  Administered 2020-06-13: 5 [IU] via SUBCUTANEOUS
  Administered 2020-06-14: 8 [IU] via SUBCUTANEOUS
  Administered 2020-06-14: 5 [IU] via SUBCUTANEOUS
  Administered 2020-06-15 (×2): 2 [IU] via SUBCUTANEOUS
  Administered 2020-06-16: 3 [IU] via SUBCUTANEOUS
  Administered 2020-06-17: 5 [IU] via SUBCUTANEOUS
  Administered 2020-06-18 (×2): 3 [IU] via SUBCUTANEOUS
  Administered 2020-06-19 (×2): 5 [IU] via SUBCUTANEOUS

## 2020-06-08 MED ORDER — DULOXETINE HCL 30 MG PO CPEP
60.0000 mg | ORAL_CAPSULE | Freq: Every day | ORAL | Status: DC
  Filled 2020-06-08: qty 2

## 2020-06-08 MED ORDER — FLEET ENEMA 7-19 GM/118ML RE ENEM: 1.0000 | ENEMA | Freq: Once | RECTAL | Status: DC | PRN

## 2020-06-08 MED ORDER — POLYETHYLENE GLYCOL 3350 17 G PO PACK
17.0000 g | PACK | Freq: Every day | ORAL | Status: DC
  Administered 2020-06-08 – 2020-06-09 (×2): 17 g via ORAL
  Filled 2020-06-08 (×2): qty 1

## 2020-06-08 MED ORDER — ACETAMINOPHEN 325 MG PO TABS
325.0000 mg | ORAL_TABLET | ORAL | Status: DC | PRN
  Administered 2020-06-13 – 2020-06-18 (×4): 650 mg via ORAL
  Filled 2020-06-08 (×4): qty 2

## 2020-06-08 MED ORDER — ENOXAPARIN SODIUM 40 MG/0.4ML ~~LOC~~ SOLN
40.0000 mg | SUBCUTANEOUS | Status: DC
  Administered 2020-06-09 – 2020-06-10 (×2): 40 mg via SUBCUTANEOUS
  Filled 2020-06-08 (×3): qty 0.4

## 2020-06-08 MED ORDER — INSULIN NPH (HUMAN) (ISOPHANE) 100 UNIT/ML ~~LOC~~ SUSP: 18.0000 [IU] | Freq: Every day | SUBCUTANEOUS | 11 refills | Status: AC

## 2020-06-08 MED ORDER — LISINOPRIL 20 MG PO TABS
40.0000 mg | ORAL_TABLET | Freq: Every day | ORAL | Status: DC
  Administered 2020-06-09 – 2020-06-10 (×2): 40 mg via ORAL
  Filled 2020-06-08 (×2): qty 2

## 2020-06-08 MED ORDER — ASPIRIN 81 MG PO CHEW
81.0000 mg | CHEWABLE_TABLET | Freq: Every day | ORAL | Status: DC
  Administered 2020-06-09 – 2020-06-20 (×12): 81 mg via ORAL
  Filled 2020-06-08 (×12): qty 1

## 2020-06-08 MED ORDER — PREDNISONE 2.5 MG PO TABS
2.5000 mg | ORAL_TABLET | Freq: Every day | ORAL | Status: DC
  Administered 2020-06-08 – 2020-06-19 (×12): 2.5 mg via ORAL
  Filled 2020-06-08 (×14): qty 1

## 2020-06-08 NOTE — Progress Notes (Signed)
Inpatient Rehab Admissions Coordinator:   I have a bed for this Pt. On CIR and will work on moving her over. RN may call report to [phone].  Megan Salon, MS, CCC-SLP Rehab Admissions Coordinator  [phone] (celll) [phone] (office)

## 2020-06-08 NOTE — Progress Notes (Signed)
To Rehab alert patient  per bed accompanied by NT and family members; Oriented to unit set up; fall precaution bundle discussed and signed by patient

## 2020-06-08 NOTE — Progress Notes (Signed)
Inpatient Rehabilitation Medication Review by a Pharmacist  A complete drug regimen review was completed for this patient to identify any potential clinically significant medication issues.  Clinically significant medication issues were identified:  no  Check AMION for pharmacist assigned to patient if future medication questions/issues arise during this admission.  Pharmacist comments: Medications reviewed; no issues noted.  Time spent performing this drug regimen review (minutes):  10 minutes    Nadara Mustard, PharmD., MS 06/08/2020 3:46 PM

## 2020-06-08 NOTE — H&P (Signed)
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  History taken from chart review, husband, and patient.  Patient was noted to have venous and lymphatic insufficiency with significant edema and appears to have had nonhealing surgical incisions.  She also had increasing burning pain.  She was seen by Ortho and decided to proceed with bilateral AKAs per Dr. Lajoyce Corners on 06/04/2020 with application of wound VAC, which was later DC'd due to intolerance.  Hospital course has been complicated by postoperative pain, labile blood sugars, leukocytosis, acute blood loss anemia, as well as anxiety.  She was seen by therapies and CIR recommended due to decreasing burden of care as well as improving functional mobility at wheelchair level.  Please see preadmission assessment from earlier today as well.  Review of Systems  Constitutional: Positive for malaise/fatigue. Negative for chills and fever.  Gastrointestinal: Positive for constipation.  Musculoskeletal: Positive for joint pain and myalgias.  Neurological: Positive for sensory change and weakness.  Psychiatric/Behavioral:       Anxiety  All other systems reviewed and are negative.  Past Medical History:  Diagnosis Date  . Anxiety   . Arthritis, rheumatoid (HCC)   . Coronary artery disease   . Depression   . Diabetes mellitus without complication (HCC)   . DVT (deep venous thrombosis) (HCC)    Left leg 10/2019  . Headache   . History of blood transfusion   . Hyperlipidemia    . Hypertension   . Latent tuberculosis    pt denies this dx  . Peripheral vascular disease (HCC)    blood clot in left leg  . Type 1 diabetes [facility])    Past Surgical History:  Procedure Laterality Date  . AMPUTATION Bilateral 12/19/2019   Procedure: BILATERAL BELOW KNEE AMPUTATION;  Surgeon: Nadara Mustard, MD;  Location: [facility] OR;  Service: Orthopedics;  Laterality: Bilateral;  . AMPUTATION Bilateral 06/04/2020   Procedure: BILATERAL ABOVE KNEE AMPUTATION;  Surgeon: Nadara Mustard, MD;  Location: [facility] OR;  Service: Orthopedics;  Laterality: Bilateral;  . APPLICATION OF WOUND VAC Bilateral 06/04/2020   Procedure: APPLICATION OF WOUND VAC;  Surgeon: Nadara Mustard, MD;  Location: [facility] OR;  Service: Orthopedics;  Laterality: Bilateral;  . CARDIAC CATHETERIZATION  11/02/2019  . CARPAL TUNNEL RELEASE Bilateral   . EYE SURGERY Bilateral    09/2018  . FINGER SURGERY  07/11/2014   right middle finger - nodule removed  . FINGER SURGERY  02/20/2017   right middle finger  . FOOT SURGERY Right 02/26/2014   screw  . I & D EXTREMITY Right 08/15/2019   Procedure: RIGHT ACHILLES DEBRIDEMENT, POSSIBLE SKIN GRAFT;  Surgeon: Nadara Mustard, MD;  Location: [facility] OR;  Service: Orthopedics;  Laterality: Right;  . JOINT REPLACEMENT    . SHOULDER SURGERY Right 01/06/2005  . TRIGGER FINGER RELEASE  12/202002   left hand -   . WRIST SURGERY Left 10/23/2004   and middle finger  . WRIST SURGERY Right 11/05/2005  . WRIST SURGERY Right 03/11/2006   cyst removed   History reviewed. No pertinent family history. Social History:  reports that she has  never smoked. She has never used smokeless tobacco. She reports that she does not drink alcohol and does not use drugs. Allergies:  Allergies  Allergen Reactions  . Aspirin Other (See Comments)    Nose bleed  . Latex Rash  . Other Rash    VINYL   . Cimzia [Certolizumab Pegol] Other (See Comments)    bleeding  . Clindamycin/Lincomycin Diarrhea  . Diclofenac Nausea  Only  . Enbrel [Etanercept] Hives  . Erythromycin Other (See Comments)    Severe stomach cramping  . Lasix [Furosemide] Itching  . Leflunomide Other (See Comments)    Fatigue/anxiety  . Rynatan [Chlorpheniramine-Phenyleph Er] Other (See Comments)    Stomach ache   . Statins Other (See Comments)    Myalgias.  . Tramadol Nausea And Vomiting  . Chlorhexidine Rash    CHG wipes  . Keflet [Cephalexin] Rash    Severe stomach cramping  . Penicillins Rash    Reaction: 3-4 years  . Pneumococcal Polysaccharide Vaccine Rash    All over body  . Sulfa Antibiotics Rash   Medications Prior to Admission  Medication Sig Dispense Refill  . acetaminophen (TYLENOL) 325 MG tablet Take 1-2 tablets (325-650 mg total) by mouth every 4 (four) hours as needed for mild pain (pain score 1-3 or temp > 100.5).    Marland Kitchen apixaban (ELIQUIS) 5 MG TABS tablet Take 1 tablet (5 mg total) by mouth 2 (two) times daily. 60 tablet 0  . cholecalciferol (VITAMIN D) 25 MCG (1000 UNIT) tablet Take 1 tablet (1,000 Units total) by mouth daily. 30 tablet 0  . clopidogrel (PLAVIX) 75 MG tablet Take 1 tablet (75 mg total) by mouth daily. 30 tablet 0  . doxycycline (VIBRA-TABS) 100 MG tablet Take 1 tablet (100 mg total) by mouth 2 (two) times daily. 30 tablet 0  . HUMALOG 100 UNIT/ML injection continuous. Via Insulin Pump    . HUMULIN N 100 UNIT/ML injection Inject 18 Units into the skin at bedtime.    . Insulin Human (INSULIN PUMP) SOLN Inject into the skin as directed. With humalog insulin    . lisinopril (ZESTRIL) 40 MG tablet Take 40 mg by mouth daily.    . metoprolol tartrate (LOPRESSOR) 25 MG tablet Take 0.5 tablets (12.5 mg total) by mouth 2 (two) times daily. 60 tablet 0  . predniSONE (DELTASONE) 5 MG tablet Take 2.5-12 mg by mouth See admin instructions. Take 12 mg by mouth in the morning & take 2.5 mg by mouth at night.    Marland Kitchen aspirin EC 81 MG tablet Take 81 mg by mouth daily. Swallow whole.  Has not started.    . bumetanide  (BUMEX) 2 MG tablet Take 1 tablet (2 mg total) by mouth daily. (Patient not taking: No sig reported) 30 tablet 0  . docusate sodium (COLACE) 100 MG capsule Take 1 capsule (100 mg total) by mouth 2 (two) times daily. (Patient not taking: Reported on 05/29/2020) 10 capsule 0  . DULoxetine (CYMBALTA) 60 MG capsule Take 1 capsule (60 mg total) by mouth at bedtime. (Patient not taking: Reported on 05/29/2020) 30 capsule 3  . glucose blood (ONETOUCH ULTRA) test strip Use 6 times daily. E11.65 on insulin pump    . Insulin Syringe-Needle U-100 (INSULIN SYRINGE .3CC/31GX5/16") 31G X 5/16" 0.3 ML MISC Use 1 time daily    . methocarbamol (ROBAXIN) 500 MG tablet Take 1 tablet (500 mg total) by mouth every 8 (eight) hours as needed for muscle spasms. (Patient not taking: No sig reported)  30 tablet 3  . oxyCODONE (OXY IR/ROXICODONE) 5 MG immediate release tablet Take 1-2 tablets (5-10 mg total) by mouth every 4 (four) hours as needed for moderate pain (pain score 4-6). (Patient not taking: No sig reported) 30 tablet 0    Home: Home Living Family/patient expects to be discharged to:: Private residence Living Arrangements: Spouse/significant other Available Help at Discharge: Family,Available 24 hours/day Type of Home: House Home Access: Ramped entrance Home Layout: One level Bathroom Shower/Tub: Health visitor: Standard Bathroom Accessibility: Yes Home Equipment: Environmental consultant - 2 wheels,Bedside commode,Wheelchair - manual,Wheelchair - power,Tub bench Additional Comments: spouse very supportive and has been assisting with medical care    Functional History: Prior Function Level of Independence: Needs assistance Gait / Transfers Assistance Needed: mod I using slide board vs lateral scoot transfer ADL's / Homemaking Assistance Needed: spouse assists with dressing and bathing Comments: Has w/c transport van.  Functional Status:  Mobility: Bed Mobility Overal bed mobility: Needs Assistance Bed  Mobility: Supine to Sit,Sit to Supine Rolling: +2 for physical assistance,Max assist Supine to sit: Mod assist,+2 for physical assistance Sit to supine: HOB elevated,Max assist,+2 for physical assistance General bed mobility comments: Pt performed rolling to R side for pericare and pad placement.  Pt required cues for hand placement and assistance to rotate hips during rolling. Transfers Overall transfer level: Needs assistance Equipment used: Sliding board Transfers: Lateral/Scoot Transfers  Lateral/Scoot Transfers: Mod assist,+2 physical assistance General transfer comment: Pt required support to keep her upright and avoid posterior bias.  PTA utilized bed pad to scoot from surface to surface.  Cues for patient hand placement to assist with scooting.  Once in power chair and to scoot posterior with B arms.  In chair reviewed pressure relief techniques. Ambulation/Gait Ambulation/Gait assistance:  (unable.  B AKA) General Gait Details: nonambulatory at baseline Wheelchair Mobility Wheelchair mobility: Yes Wheelchair propulsion: Right upper extremity Wheelchair parts: Needs assistance  ADL: ADL Overall ADL's : Needs assistance/impaired Eating/Feeding: Independent,Sitting Grooming: Set up,Sitting,Bed level Upper Body Bathing: Moderate assistance Lower Body Bathing: Maximal assistance,Sitting/lateral leans Upper Body Dressing : Moderate assistance,Sitting Lower Body Dressing: Maximal assistance,Sitting/lateral leans Toilet Transfer Details (indicate cue type and reason): deferred Toileting - Clothing Manipulation Details (indicate cue type and reason): deferred General ADL Comments: limited by generalized deconditioning, pain, anxiety, mobility limitations secondary to bilat AKA  Cognition: Cognition Overall Cognitive Status: Within Functional Limits for tasks assessed Orientation Level: Oriented X4 Cognition Arousal/Alertness: Awake/alert Behavior During Therapy: Anxious Overall  Cognitive Status: Within Functional Limits for tasks assessed General Comments: increased fear of falling, pain anticipation  Physical Exam: Blood pressure 132/69, pulse 89, temperature 98.2 F (36.8 C), temperature source Oral, resp. rate 17, height 4\' 3"  (1.295 m), weight 69.4 kg, SpO2 100 %. Physical Exam Vitals reviewed.  Constitutional:      Appearance: She is obese.  HENT:     Head: Normocephalic and atraumatic.     Right Ear: External ear normal.     Left Ear: External ear normal.     Nose: Nose normal.  Eyes:     General:        Right eye: No discharge.        Left eye: No discharge.     Extraocular Movements: Extraocular movements intact.  Cardiovascular:     Rate and Rhythm: Normal rate and regular rhythm.  Pulmonary:     Effort: Pulmonary effort is normal. No respiratory distress.     Breath sounds: No stridor.  Abdominal:  General: Abdomen is flat. Bowel sounds are normal. There is no distension.  Musculoskeletal:     Comments: Bilateral AKA's with edema and tenderness, left> right  Skin:    Comments: Bilateral AKA's with dressing CDI Scattered ecchymosis  Neurological:     Mental Status: She is alert.     Comments: Alert and oriented Bilateral upper extremities: 4/5 proximal distal (pain inhibition) Right lower extremity: Hip flexion 4+/5 (pain inversion) Left lower extremity: Hip flexion 2+/5 (pain inhibition)  Psychiatric:        Mood and Affect: Mood is anxious.        Speech: Speech normal.     Results for orders placed or performed during the hospital encounter of 06/04/20 (from the past 48 hour(s))  Glucose, capillary     Status: Abnormal   Collection Time: 06/06/20 11:20 AM  Result Value Ref Range   Glucose-Capillary 39 (LL) 70 - 99 mg/dL    Comment: Glucose reference range applies only to samples taken after fasting for at least 8 hours.   Comment 1 Notify RN   Glucose, capillary     Status: Abnormal   Collection Time: 06/06/20 11:45 AM   Result Value Ref Range   Glucose-Capillary 48 (L) 70 - 99 mg/dL    Comment: Glucose reference range applies only to samples taken after fasting for at least 8 hours.  Glucose, capillary     Status: Abnormal   Collection Time: 06/06/20 12:16 PM  Result Value Ref Range   Glucose-Capillary 49 (L) 70 - 99 mg/dL    Comment: Glucose reference range applies only to samples taken after fasting for at least 8 hours.  Glucose, capillary     Status: Abnormal   Collection Time: 06/06/20  1:09 PM  Result Value Ref Range   Glucose-Capillary 143 (H) 70 - 99 mg/dL    Comment: Glucose reference range applies only to samples taken after fasting for at least 8 hours.  Glucose, capillary     Status: None   Collection Time: 06/06/20  4:00 PM  Result Value Ref Range   Glucose-Capillary 91 70 - 99 mg/dL    Comment: Glucose reference range applies only to samples taken after fasting for at least 8 hours.  Glucose, capillary     Status: None   Collection Time: 06/06/20  8:33 PM  Result Value Ref Range   Glucose-Capillary 93 70 - 99 mg/dL    Comment: Glucose reference range applies only to samples taken after fasting for at least 8 hours.  Glucose, capillary     Status: Abnormal   Collection Time: 06/07/20  6:43 AM  Result Value Ref Range   Glucose-Capillary 112 (H) 70 - 99 mg/dL    Comment: Glucose reference range applies only to samples taken after fasting for at least 8 hours.  Glucose, capillary     Status: Abnormal   Collection Time: 06/07/20 11:03 AM  Result Value Ref Range   Glucose-Capillary 143 (H) 70 - 99 mg/dL    Comment: Glucose reference range applies only to samples taken after fasting for at least 8 hours.  CBC with Differential/Platelet     Status: Abnormal   Collection Time: 06/07/20 12:53 PM  Result Value Ref Range   WBC 17.1 (H) 4.0 - 10.5 K/uL   RBC 2.94 (L) 3.87 - 5.11 MIL/uL   Hemoglobin 7.6 (L) 12.0 - 15.0 g/dL   HCT 74.0 (L) 81.4 - 48.1 %   MCV 82.7 80.0 - 100.0 fL   MCH  25.9  (L) 26.0 - 34.0 pg   MCHC 31.3 30.0 - 36.0 g/dL   RDW 16.1 (H) 09.6 - 04.5 %   Platelets 215 150 - 400 K/uL   nRBC 0.0 0.0 - 0.2 %   Neutrophils Relative % 87 %   Neutro Abs 14.7 (H) 1.7 - 7.7 K/uL   Lymphocytes Relative 5 %   Lymphs Abs 0.8 0.7 - 4.0 K/uL   Monocytes Relative 7 %   Monocytes Absolute 1.2 (H) 0.1 - 1.0 K/uL   Eosinophils Relative 0 %   Eosinophils Absolute 0.1 0.0 - 0.5 K/uL   Basophils Relative 0 %   Basophils Absolute 0.0 0.0 - 0.1 K/uL   Immature Granulocytes 1 %   Abs Immature Granulocytes 0.20 (H) 0.00 - 0.07 K/uL    Comment: Performed at [facility], [address] [place], [place] [address]  Glucose, capillary     Status: Abnormal   Collection Time: 06/07/20  4:40 PM  Result Value Ref Range   Glucose-Capillary 224 (H) 70 - 99 mg/dL    Comment: Glucose reference range applies only to samples taken after fasting for at least 8 hours.  Glucose, capillary     Status: Abnormal   Collection Time: 06/07/20  7:29 PM  Result Value Ref Range   Glucose-Capillary 248 (H) 70 - 99 mg/dL    Comment: Glucose reference range applies only to samples taken after fasting for at least 8 hours.  Glucose, capillary     Status: Abnormal   Collection Time: 06/08/20  6:49 AM  Result Value Ref Range   Glucose-Capillary 279 (H) 70 - 99 mg/dL    Comment: Glucose reference range applies only to samples taken after fasting for at least 8 hours.   No results found.   Medical Problem List and Plan: 1.  Deficits with mobility, transfers, endurance, self-care secondary to wound dehiscence status post bilateral AKA.  Patient may shower with incisions covered  Goals/ELOS: Min/mod a at wheelchair level/8-12 days  Admit to CIR 2.  DVT Prophylaxis/Anticoagulation: Pharmaceutical: Will consider need for restarting Eliquis  Antiplatelets: Clopidogrel 75, ASA 81 3. Pain Management: Oxycodone as needed  Robaxin 500 3 times daily as needed  Cymbalta 60 daily  Monitor with increased  exertion 4. Mood/anxiety/depression: Team support  Cymbalta 60 mg daily  Will consider additional medications as necessary 5. Neuropsych: This patient is capable of making decisions on her own behalf. 6. Skin/Wound Care: Routine skin care/monitoring incision sites 7. Fluids/Electrolytes/Nutrition: Routine I/OS  CMP ordered for tomorrow 8.  Vitamin D deficiency  Vitamin D 1000 units daily 9.  Drug-induced constipation  Colace 100 mg twice daily  MiraLAX daily added 10.  Type 1 diabetes mellitus type II with hyperglycemia  SSI  Labile at present  Patient has insulin pump, will restart and monitor with increased mobility 11.  PVD  See #2  12.  Essential hypertension  Lopressor 12.5 twice daily, Bumex 2 mg daily 13.  Hyperlipidemia  Zestril 40 mg daily 14.  History of DVT  See #2 15.  CAD  See #2 16.  RA  Prednisone 12.5 mg daily, 2.5 mg nightly 17.  Leukocytosis  Afebrile  CBC ordered for tomorrow 18.  Acute blood loss anemia  Hemoglobin 7.6 on 3/26  CBC ordered for tomorrow  Maryla Morrow, MD, ABPMR 06/08/2020   The patient's status has not changed. Any changes from the pre-admission screening or documentation from the acute chart are noted above.   Zylah Elsbernd  Posey Pronto, MD, ABPMR

## 2020-06-08 NOTE — Discharge Summary (Signed)
Discharge Diagnoses:  Active Problems:   Dehiscence of amputation stump (HCC)   Wound dehiscence   Surgeries: Procedure(s): BILATERAL ABOVE KNEE AMPUTATION APPLICATION OF WOUND VAC on 06/04/2020    Consultants:   Discharged Condition: Improved  Hospital Course: Kaitlin Hudson is an 65 y.o. female who was admitted 06/04/2020 with a chief complaint of dehiscence bilateral below-knee amputations, with a final diagnosis of Dehiscence Bilateral Below Knee Amputation.  Patient was brought to the operating room on 06/04/2020 and underwent Procedure(s): BILATERAL ABOVE KNEE AMPUTATION APPLICATION OF WOUND VAC.    Patient was given perioperative antibiotics:  Anti-infectives (From admission, onward)   Start     Dose/Rate Route Frequency Ordered Stop   06/04/20 1600  clindamycin (CLEOCIN) IVPB 600 mg        600 mg 100 mL/hr over 30 Minutes Intravenous Every 6 hours 06/04/20 1512 06/05/20 0413   06/04/20 0745  clindamycin (CLEOCIN) IVPB 900 mg        900 mg 100 mL/hr over 30 Minutes Intravenous On call to O.R. 06/04/20 6045 06/04/20 0955    .  Patient was given sequential compression devices, early ambulation, and aspirin for DVT prophylaxis.  Recent vital signs:  Patient Vitals for the past 24 hrs:  BP Temp Temp src Pulse Resp SpO2  06/08/20 0830 132/69 98.2 F (36.8 C) Oral 89 17 100 %  06/08/20 0642 (!) 121/52 98.6 F (37 C) Oral (!) 105 16 97 %  06/07/20 2112 (!) 127/50 98.5 F (36.9 C) Oral (!) 102 16 99 %  06/07/20 1450 102/66 98.6 F (37 C) Oral 95 17 97 %  .  Recent laboratory studies: No results found.  Discharge Medications:   Allergies as of 06/08/2020      Reactions   Aspirin Other (See Comments)   Nose bleed   Latex Rash   Other Rash   VINYL   Cimzia [certolizumab Pegol] Other (See Comments)   bleeding   Clindamycin/lincomycin Diarrhea   Diclofenac Nausea Only   Enbrel [etanercept] Hives   Erythromycin Other (See Comments)   Severe stomach cramping   Lasix  [furosemide] Itching   Leflunomide Other (See Comments)   Fatigue/anxiety   Rynatan [chlorpheniramine-phenyleph Er] Other (See Comments)   Stomach ache    Statins Other (See Comments)   Myalgias.   Tramadol Nausea And Vomiting   Chlorhexidine Rash   CHG wipes   Keflet [cephalexin] Rash   Severe stomach cramping   Penicillins Rash   Reaction: 3-4 years   Pneumococcal Polysaccharide Vaccine Rash   All over body   Sulfa Antibiotics Rash      Medication List    STOP taking these medications   acetaminophen 325 MG tablet Commonly known as: TYLENOL   doxycycline 100 MG tablet Commonly known as: VIBRA-TABS   DULoxetine 60 MG capsule Commonly known as: CYMBALTA   insulin pump Soln   oxyCODONE 5 MG immediate release tablet Commonly known as: Oxy IR/ROXICODONE   predniSONE 5 MG tablet Commonly known as: DELTASONE     TAKE these medications   apixaban 5 MG Tabs tablet Commonly known as: Eliquis Take 1 tablet (5 mg total) by mouth 2 (two) times daily.   aspirin EC 81 MG tablet Take 81 mg by mouth daily. Swallow whole.  Has not started.   bumetanide 2 MG tablet Commonly known as: BUMEX Take 1 tablet (2 mg total) by mouth daily.   cholecalciferol 25 MCG (1000 UNIT) tablet Commonly known as: VITAMIN D Take 1 tablet (  1,000 Units total) by mouth daily.   clopidogrel 75 MG tablet Commonly known as: PLAVIX Take 1 tablet (75 mg total) by mouth daily.   docusate sodium 100 MG capsule Commonly known as: COLACE Take 1 capsule (100 mg total) by mouth 2 (two) times daily.   HumaLOG 100 UNIT/ML injection Generic drug: insulin lispro continuous. Via Insulin Pump   HumuLIN N 100 UNIT/ML injection Generic drug: insulin NPH Human Inject 18 Units into the skin at bedtime. What changed: Another medication with the same name was added. Make sure you understand how and when to take each.   insulin NPH Human 100 UNIT/ML injection Commonly known as: HumuLIN N Inject 0.18 mLs  (18 Units total) into the skin at bedtime. What changed: You were already taking a medication with the same name, and this prescription was added. Make sure you understand how and when to take each.   insulin aspart 100 UNIT/ML injection Commonly known as: novoLOG Inject 0-15 Units into the skin 3 (three) times daily with meals.   insulin aspart 100 UNIT/ML injection Commonly known as: novoLOG Inject 4 Units into the skin 3 (three) times daily with meals.   INSULIN SYRINGE .3CC/31GX5/16" 31G X 5/16" 0.3 ML Misc Use 1 time daily   lisinopril 40 MG tablet Commonly known as: ZESTRIL Take 40 mg by mouth daily.   methocarbamol 500 MG tablet Commonly known as: Robaxin Take 1 tablet (500 mg total) by mouth every 8 (eight) hours as needed for muscle spasms.   metoprolol tartrate 25 MG tablet Commonly known as: LOPRESSOR Take 0.5 tablets (12.5 mg total) by mouth 2 (two) times daily.   OneTouch Ultra test strip Generic drug: glucose blood Use 6 times daily. E11.65 on insulin pump   oxyCODONE-acetaminophen 5-325 MG tablet Commonly known as: Percocet Take 1 tablet by mouth every 4 (four) hours as needed.       Diagnostic Studies: No results found.  Patient benefited maximally from their hospital stay and there were no complications.     Disposition: Discharge disposition: 02-Transferred to [facility]      Discharge Instructions    Call MD / Call 911   Complete by: As directed    If you experience chest pain or shortness of breath, CALL 911 and be transported to the hospital emergency room.  If you develope a fever above 101 F, pus (white drainage) or increased drainage or redness at the wound, or calf pain, call your surgeon's office.   Call MD / Call 911   Complete by: As directed    If you experience chest pain or shortness of breath, CALL 911 and be transported to the hospital emergency room.  If you develope a fever above 101 F, pus (white drainage) or increased  drainage or redness at the wound, or calf pain, call your surgeon's office.   Constipation Prevention   Complete by: As directed    Drink plenty of fluids.  Prune juice may be helpful.  You may use a stool softener, such as Colace (over the counter) 100 mg twice a day.  Use MiraLax (over the counter) for constipation as needed.   Constipation Prevention   Complete by: As directed    Drink plenty of fluids.  Prune juice may be helpful.  You may use a stool softener, such as Colace (over the counter) 100 mg twice a day.  Use MiraLax (over the counter) for constipation as needed.   Diet - low sodium heart healthy  Complete by: As directed    Diet - low sodium heart healthy   Complete by: As directed    Increase activity slowly as tolerated   Complete by: As directed    Increase activity slowly as tolerated   Complete by: As directed       Follow-up Information    Adonis Huguenin, NP In 1 week.   Specialty: Orthopedic Surgery Contact information: [address] [place] [address] [phone]                Signed: Nadara Mustard 06/08/2020, 8:42 AM

## 2020-06-08 NOTE — H&P (Addendum)
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  History taken from chart review, husband, and patient.  Patient was noted to have venous and lymphatic insufficiency with significant edema and appears to have had nonhealing surgical incisions.  She also had increasing burning pain.  She was seen by Ortho and decided to proceed with bilateral AKAs per Dr. Lajoyce Corners on 06/04/2020 with application of wound VAC, which was later DC'd due to intolerance.  Hospital course has been complicated by postoperative pain, labile blood sugars, leukocytosis, acute blood loss anemia, as well as anxiety.  She was seen by therapies and CIR recommended due to decreasing burden of care as well as improving functional mobility at wheelchair level.  Please see preadmission assessment from earlier today as well.  Review of Systems  Constitutional: Positive for malaise/fatigue. Negative for chills and fever.  Gastrointestinal: Positive for constipation.  Musculoskeletal: Positive for joint pain and myalgias.  Neurological: Positive for sensory change and weakness.  Psychiatric/Behavioral:       Anxiety  All other systems reviewed and are negative.  Past Medical History:  Diagnosis Date  . Anxiety   . Arthritis, rheumatoid (HCC)   . Coronary artery disease   . Depression   . Diabetes mellitus without complication (HCC)   . DVT (deep venous thrombosis) (HCC)    Left leg 10/2019  . Headache   . History of blood transfusion   . Hyperlipidemia    . Hypertension   . Latent tuberculosis    pt denies this dx  . Peripheral vascular disease (HCC)    blood clot in left leg  . Type 1 diabetes [facility])    Past Surgical History:  Procedure Laterality Date  . AMPUTATION Bilateral 12/19/2019   Procedure: BILATERAL BELOW KNEE AMPUTATION;  Surgeon: Nadara Mustard, MD;  Location: [facility] OR;  Service: Orthopedics;  Laterality: Bilateral;  . AMPUTATION Bilateral 06/04/2020   Procedure: BILATERAL ABOVE KNEE AMPUTATION;  Surgeon: Nadara Mustard, MD;  Location: [facility] OR;  Service: Orthopedics;  Laterality: Bilateral;  . APPLICATION OF WOUND VAC Bilateral 06/04/2020   Procedure: APPLICATION OF WOUND VAC;  Surgeon: Nadara Mustard, MD;  Location: [facility] OR;  Service: Orthopedics;  Laterality: Bilateral;  . CARDIAC CATHETERIZATION  11/02/2019  . CARPAL TUNNEL RELEASE Bilateral   . EYE SURGERY Bilateral    09/2018  . FINGER SURGERY  07/11/2014   right middle finger - nodule removed  . FINGER SURGERY  02/20/2017   right middle finger  . FOOT SURGERY Right 02/26/2014   screw  . I & D EXTREMITY Right 08/15/2019   Procedure: RIGHT ACHILLES DEBRIDEMENT, POSSIBLE SKIN GRAFT;  Surgeon: Nadara Mustard, MD;  Location: [facility] OR;  Service: Orthopedics;  Laterality: Right;  . JOINT REPLACEMENT    . SHOULDER SURGERY Right 01/06/2005  . TRIGGER FINGER RELEASE  12/202002   left hand -   . WRIST SURGERY Left 10/23/2004   and middle finger  . WRIST SURGERY Right 11/05/2005  . WRIST SURGERY Right 03/11/2006   cyst removed   History reviewed. No pertinent family history. Social History:  reports that she has  never smoked. She has never used smokeless tobacco. She reports that she does not drink alcohol and does not use drugs. Allergies:  Allergies  Allergen Reactions  . Aspirin Other (See Comments)    Nose bleed  . Latex Rash  . Other Rash    VINYL   . Cimzia [Certolizumab Pegol] Other (See Comments)    bleeding  . Clindamycin/Lincomycin Diarrhea  . Diclofenac Nausea  Only  . Enbrel [Etanercept] Hives  . Erythromycin Other (See Comments)    Severe stomach cramping  . Lasix [Furosemide] Itching  . Leflunomide Other (See Comments)    Fatigue/anxiety  . Rynatan [Chlorpheniramine-Phenyleph Er] Other (See Comments)    Stomach ache   . Statins Other (See Comments)    Myalgias.  . Tramadol Nausea And Vomiting  . Chlorhexidine Rash    CHG wipes  . Keflet [Cephalexin] Rash    Severe stomach cramping  . Penicillins Rash    Reaction: 3-4 years  . Pneumococcal Polysaccharide Vaccine Rash    All over body  . Sulfa Antibiotics Rash   Medications Prior to Admission  Medication Sig Dispense Refill  . acetaminophen (TYLENOL) 325 MG tablet Take 1-2 tablets (325-650 mg total) by mouth every 4 (four) hours as needed for mild pain (pain score 1-3 or temp > 100.5).    Marland Kitchen apixaban (ELIQUIS) 5 MG TABS tablet Take 1 tablet (5 mg total) by mouth 2 (two) times daily. 60 tablet 0  . cholecalciferol (VITAMIN D) 25 MCG (1000 UNIT) tablet Take 1 tablet (1,000 Units total) by mouth daily. 30 tablet 0  . clopidogrel (PLAVIX) 75 MG tablet Take 1 tablet (75 mg total) by mouth daily. 30 tablet 0  . doxycycline (VIBRA-TABS) 100 MG tablet Take 1 tablet (100 mg total) by mouth 2 (two) times daily. 30 tablet 0  . HUMALOG 100 UNIT/ML injection continuous. Via Insulin Pump    . HUMULIN N 100 UNIT/ML injection Inject 18 Units into the skin at bedtime.    . Insulin Human (INSULIN PUMP) SOLN Inject into the skin as directed. With humalog insulin    . lisinopril (ZESTRIL) 40 MG tablet Take 40 mg by mouth daily.    . metoprolol tartrate (LOPRESSOR) 25 MG tablet Take 0.5 tablets (12.5 mg total) by mouth 2 (two) times daily. 60 tablet 0  . predniSONE (DELTASONE) 5 MG tablet Take 2.5-12 mg by mouth See admin instructions. Take 12 mg by mouth in the morning & take 2.5 mg by mouth at night.    Marland Kitchen aspirin EC 81 MG tablet Take 81 mg by mouth daily. Swallow whole.  Has not started.    . bumetanide  (BUMEX) 2 MG tablet Take 1 tablet (2 mg total) by mouth daily. (Patient not taking: No sig reported) 30 tablet 0  . docusate sodium (COLACE) 100 MG capsule Take 1 capsule (100 mg total) by mouth 2 (two) times daily. (Patient not taking: Reported on 05/29/2020) 10 capsule 0  . DULoxetine (CYMBALTA) 60 MG capsule Take 1 capsule (60 mg total) by mouth at bedtime. (Patient not taking: Reported on 05/29/2020) 30 capsule 3  . glucose blood (ONETOUCH ULTRA) test strip Use 6 times daily. E11.65 on insulin pump    . Insulin Syringe-Needle U-100 (INSULIN SYRINGE .3CC/31GX5/16") 31G X 5/16" 0.3 ML MISC Use 1 time daily    . methocarbamol (ROBAXIN) 500 MG tablet Take 1 tablet (500 mg total) by mouth every 8 (eight) hours as needed for muscle spasms. (Patient not taking: No sig reported)  30 tablet 3  . oxyCODONE (OXY IR/ROXICODONE) 5 MG immediate release tablet Take 1-2 tablets (5-10 mg total) by mouth every 4 (four) hours as needed for moderate pain (pain score 4-6). (Patient not taking: No sig reported) 30 tablet 0    Home: Home Living Family/patient expects to be discharged to:: Private residence Living Arrangements: Spouse/significant other Available Help at Discharge: Family,Available 24 hours/day Type of Home: House Home Access: Ramped entrance Home Layout: One level Bathroom Shower/Tub: Health visitor: Standard Bathroom Accessibility: Yes Home Equipment: Environmental consultant - 2 wheels,Bedside commode,Wheelchair - manual,Wheelchair - power,Tub bench Additional Comments: spouse very supportive and has been assisting with medical care    Functional History: Prior Function Level of Independence: Needs assistance Gait / Transfers Assistance Needed: mod I using slide board vs lateral scoot transfer ADL's / Homemaking Assistance Needed: spouse assists with dressing and bathing Comments: Has w/c transport van.  Functional Status:  Mobility: Bed Mobility Overal bed mobility: Needs Assistance Bed  Mobility: Supine to Sit,Sit to Supine Rolling: +2 for physical assistance,Max assist Supine to sit: Mod assist,+2 for physical assistance Sit to supine: HOB elevated,Max assist,+2 for physical assistance General bed mobility comments: Pt performed rolling to R side for pericare and pad placement.  Pt required cues for hand placement and assistance to rotate hips during rolling. Transfers Overall transfer level: Needs assistance Equipment used: Sliding board Transfers: Lateral/Scoot Transfers  Lateral/Scoot Transfers: Mod assist,+2 physical assistance General transfer comment: Pt required support to keep her upright and avoid posterior bias.  PTA utilized bed pad to scoot from surface to surface.  Cues for patient hand placement to assist with scooting.  Once in power chair and to scoot posterior with B arms.  In chair reviewed pressure relief techniques. Ambulation/Gait Ambulation/Gait assistance:  (unable.  B AKA) General Gait Details: nonambulatory at baseline Wheelchair Mobility Wheelchair mobility: Yes Wheelchair propulsion: Right upper extremity Wheelchair parts: Needs assistance  ADL: ADL Overall ADL's : Needs assistance/impaired Eating/Feeding: Independent,Sitting Grooming: Set up,Sitting,Bed level Upper Body Bathing: Moderate assistance Lower Body Bathing: Maximal assistance,Sitting/lateral leans Upper Body Dressing : Moderate assistance,Sitting Lower Body Dressing: Maximal assistance,Sitting/lateral leans Toilet Transfer Details (indicate cue type and reason): deferred Toileting - Clothing Manipulation Details (indicate cue type and reason): deferred General ADL Comments: limited by generalized deconditioning, pain, anxiety, mobility limitations secondary to bilat AKA  Cognition: Cognition Overall Cognitive Status: Within Functional Limits for tasks assessed Orientation Level: Oriented X4 Cognition Arousal/Alertness: Awake/alert Behavior During Therapy: Anxious Overall  Cognitive Status: Within Functional Limits for tasks assessed General Comments: increased fear of falling, pain anticipation  Physical Exam: Blood pressure 132/69, pulse 89, temperature 98.2 F (36.8 C), temperature source Oral, resp. rate 17, height 4\' 3"  (1.295 m), weight 69.4 kg, SpO2 100 %. Physical Exam Vitals reviewed.  Constitutional:      Appearance: She is obese.  HENT:     Head: Normocephalic and atraumatic.     Right Ear: External ear normal.     Left Ear: External ear normal.     Nose: Nose normal.  Eyes:     General:        Right eye: No discharge.        Left eye: No discharge.     Extraocular Movements: Extraocular movements intact.  Cardiovascular:     Rate and Rhythm: Normal rate and regular rhythm.  Pulmonary:     Effort: Pulmonary effort is normal. No respiratory distress.     Breath sounds: No stridor.  Abdominal:  General: Abdomen is flat. Bowel sounds are normal. There is no distension.  Musculoskeletal:     Comments: Bilateral AKA's with edema and tenderness, left> right  Skin:    Comments: Bilateral AKA's with dressing CDI Scattered ecchymosis  Neurological:     Mental Status: She is alert.     Comments: Alert and oriented Bilateral upper extremities: 4/5 proximal distal (pain inhibition) Right lower extremity: Hip flexion 4+/5 (pain inversion) Left lower extremity: Hip flexion 2+/5 (pain inhibition)  Psychiatric:        Mood and Affect: Mood is anxious.        Speech: Speech normal.     Results for orders placed or performed during the hospital encounter of 06/04/20 (from the past 48 hour(s))  Glucose, capillary     Status: Abnormal   Collection Time: 06/06/20 11:20 AM  Result Value Ref Range   Glucose-Capillary 39 (LL) 70 - 99 mg/dL    Comment: Glucose reference range applies only to samples taken after fasting for at least 8 hours.   Comment 1 Notify RN   Glucose, capillary     Status: Abnormal   Collection Time: 06/06/20 11:45 AM   Result Value Ref Range   Glucose-Capillary 48 (L) 70 - 99 mg/dL    Comment: Glucose reference range applies only to samples taken after fasting for at least 8 hours.  Glucose, capillary     Status: Abnormal   Collection Time: 06/06/20 12:16 PM  Result Value Ref Range   Glucose-Capillary 49 (L) 70 - 99 mg/dL    Comment: Glucose reference range applies only to samples taken after fasting for at least 8 hours.  Glucose, capillary     Status: Abnormal   Collection Time: 06/06/20  1:09 PM  Result Value Ref Range   Glucose-Capillary 143 (H) 70 - 99 mg/dL    Comment: Glucose reference range applies only to samples taken after fasting for at least 8 hours.  Glucose, capillary     Status: None   Collection Time: 06/06/20  4:00 PM  Result Value Ref Range   Glucose-Capillary 91 70 - 99 mg/dL    Comment: Glucose reference range applies only to samples taken after fasting for at least 8 hours.  Glucose, capillary     Status: None   Collection Time: 06/06/20  8:33 PM  Result Value Ref Range   Glucose-Capillary 93 70 - 99 mg/dL    Comment: Glucose reference range applies only to samples taken after fasting for at least 8 hours.  Glucose, capillary     Status: Abnormal   Collection Time: 06/07/20  6:43 AM  Result Value Ref Range   Glucose-Capillary 112 (H) 70 - 99 mg/dL    Comment: Glucose reference range applies only to samples taken after fasting for at least 8 hours.  Glucose, capillary     Status: Abnormal   Collection Time: 06/07/20 11:03 AM  Result Value Ref Range   Glucose-Capillary 143 (H) 70 - 99 mg/dL    Comment: Glucose reference range applies only to samples taken after fasting for at least 8 hours.  CBC with Differential/Platelet     Status: Abnormal   Collection Time: 06/07/20 12:53 PM  Result Value Ref Range   WBC 17.1 (H) 4.0 - 10.5 K/uL   RBC 2.94 (L) 3.87 - 5.11 MIL/uL   Hemoglobin 7.6 (L) 12.0 - 15.0 g/dL   HCT 74.0 (L) 81.4 - 48.1 %   MCV 82.7 80.0 - 100.0 fL   MCH  25.9  (L) 26.0 - 34.0 pg   MCHC 31.3 30.0 - 36.0 g/dL   RDW 30.8 (H) 65.7 - 84.6 %   Platelets 215 150 - 400 K/uL   nRBC 0.0 0.0 - 0.2 %   Neutrophils Relative % 87 %   Neutro Abs 14.7 (H) 1.7 - 7.7 K/uL   Lymphocytes Relative 5 %   Lymphs Abs 0.8 0.7 - 4.0 K/uL   Monocytes Relative 7 %   Monocytes Absolute 1.2 (H) 0.1 - 1.0 K/uL   Eosinophils Relative 0 %   Eosinophils Absolute 0.1 0.0 - 0.5 K/uL   Basophils Relative 0 %   Basophils Absolute 0.0 0.0 - 0.1 K/uL   Immature Granulocytes 1 %   Abs Immature Granulocytes 0.20 (H) 0.00 - 0.07 K/uL    Comment: Performed at [facility], [address], [place], [place] [address]  Glucose, capillary     Status: Abnormal   Collection Time: 06/07/20  4:40 PM  Result Value Ref Range   Glucose-Capillary 224 (H) 70 - 99 mg/dL    Comment: Glucose reference range applies only to samples taken after fasting for at least 8 hours.  Glucose, capillary     Status: Abnormal   Collection Time: 06/07/20  7:29 PM  Result Value Ref Range   Glucose-Capillary 248 (H) 70 - 99 mg/dL    Comment: Glucose reference range applies only to samples taken after fasting for at least 8 hours.  Glucose, capillary     Status: Abnormal   Collection Time: 06/08/20  6:49 AM  Result Value Ref Range   Glucose-Capillary 279 (H) 70 - 99 mg/dL    Comment: Glucose reference range applies only to samples taken after fasting for at least 8 hours.   No results found.   Medical Problem List and Plan: 1.  Deficits with mobility, transfers, endurance, self-care secondary to wound dehiscence status post bilateral AKA.  Patient may shower with incisions covered  Goals/ELOS: Min/mod a at wheelchair level/8-12 days  Admit to CIR 2.  DVT Prophylaxis/Anticoagulation: Pharmaceutical: Will consider restarting Eliquis tomorrow  Antiplatelets: Clopidogrel 75, ASA 81 3. Pain Management: Oxycodone as needed  Robaxin 500 3 times daily as needed  Cymbalta 60 daily  Monitor with increased  exertion 4. Mood/anxiety/depression: Team support  Cymbalta 60 mg daily  Will consider additional medications as necessary 5. Neuropsych: This patient is capable of making decisions on her own behalf. 6. Skin/Wound Care: Routine skin care/monitoring incision sites 7. Fluids/Electrolytes/Nutrition: Routine I/OS  CMP ordered for tomorrow 8.  Vitamin D deficiency  Vitamin D 1000 units daily 9.  Drug-induced constipation  Colace 100 mg twice daily  MiraLAX daily added 10.  Type 1 diabetes mellitus type II with hyperglycemia  SSI  Labile at present  Patient has insulin pump, will restart and monitor with increased mobility 11.  PVD  See #2  12.  Essential hypertension  Lopressor 12.5 twice daily, Bumex 2 mg daily 13.  Hyperlipidemia  Zestril 40 mg daily 14.  History of DVT  See #2 15.  CAD  See #2 16.  RA  Prednisone 12.5 mg daily, 2.5 mg nightly 17.  Leukocytosis  Afebrile  CBC ordered for tomorrow 18.  Acute blood loss anemia  Hemoglobin 7.6 on 3/26  CBC ordered for tomorrow  Maryla Morrow, MD, ABPMR 06/08/2020

## 2020-06-08 NOTE — Progress Notes (Signed)
PMR Admission Coordinator Pre-Admission Assessment  Patient: Kaitlin Hudson is an 64 y.o., female MRN: [id number] DOB: 11/05/1955 Height: 4' 3" (129.5 cm) Weight: 69.4 kg   Insurance Information HMO:     PPO:      PCP:      IPA:      80/20:      OTHER:  PRIMARY: Medicare a and b      Policy#: [id number]      Subscriber: pt Benefits:  Phone #: passport one online     Name: 10/8 Eff. Date: a 09/13/2018 and b 03/16/2019     Deduct: $1484      Out of Pocket Max: none      Life Max: none CIR: 100%      SNF: 20 full days Outpatient: 805     Co-Pay: 20% Home Health: 100%      Co-Pay: none DME: 80%     Co-Pay: 20% Providers: pt choice  SECONDARY: BCBS supplement      Policy#: [id number]  Financial Counselor:    Phone#:   The "Data Collection Information Summary" for patients in Inpatient Rehabilitation Facilities with attached "Privacy Act Statement-Health Care Records" was provided and verbally reviewed with: Patient and Family  Emergency Contact Information Contact Information    Name Relation Home Work Mobile   Much, Ralph Wayne Spouse [phone]  [phone]      Current Medical History  Patient Admitting Diagnosis: Bilateral AKAs  History of Present Illness: Pt. Is a   64-year-old right-handed female history ofRA with chronic prednisone,diastolic congestive heart failure, [facility] August 2021 underwent rotational atherectomy assisted multivessel PCI maintained on Plavix and Eliquis x 12 months,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 Pt. Underwent bilateral transtibial amputation due to gangrenous ischemic ulcers to BLEs in 12/19/2019 followed by a stint on CIR. Pt.'s husband reports that she was min A with ADLs and independent with slideboard transfers from her power wheelchair following admission to CIR.  She llives with spouse in a1 level  home with ramped entrance. Pt. And husband report that surgical  Pt readmitted to [facility] 06/04/2020 due to bilateral wound dehiscence. Patient presented with persistent nonresolving venous and lymphatic insufficiency in both legs with weeping edema.  She states she is unable to use the compressive shrinkers. Pt. Underwent bilateral AKAs with bilateral wound vac placement  by Dr. Duda with orthopedic surgery on 06/04/20.  Following surgery, Pt. Experienced some ABL anemia with Hgb of 7 on 3.24, improved to 8.6 later that day.  Pt. Complained of discomfort from wound vacs, so Dr. Duda d/c'd order for wound vacs on 3/25.    Patient's medical record from [place]. [facility] has been reviewed by the rehabilitation admission coordinator and physician.  Past Medical History  Past Medical History:  Diagnosis Date  . Anxiety   . Arthritis, rheumatoid (HCC)   . Coronary artery disease   . Depression   . Diabetes mellitus without complication (HCC)   . DVT (deep venous thrombosis) (HCC)    Left leg 10/2019  . Headache   . History of blood transfusion   . Hyperlipidemia   . Hypertension   . Latent tuberculosis    pt denies this dx  . Peripheral vascular disease (HCC)    blood clot in left leg  . Type 1 diabetes (HCC)     Family History   family history is not on file.    mL/hr at 06/07/20 0109, New Bag at 06/07/20 0109 .  acetaminophen (TYLENOL) tablet 325-650 mg,  325-650 mg, Oral, Q6H PRN, Persons, [place], [place], 650 mg at 06/06/20 2105 .  aspirin EC tablet 81 mg, 81 mg, Oral, Daily, Persons, [place], [place], 81 mg at 06/07/20 [id number] .  cholecalciferol (VITAMIN D3) tablet 1,000 Units, 1,000 Units, Oral, Daily, Persons, [place], [place], 1,000 Units at 06/07/20 [phone] .  clopidogrel (PLAVIX) tablet 75 mg, 75 mg, Oral, Daily, Persons, [place], [place], 75 mg at 06/07/20 [id number] .  docusate sodium (COLACE) capsule 100 mg, 100 mg, Oral, BID, Persons, [place], PA, 100 mg at 06/07/20 2114 .  feeding supplement (ENSURE ENLIVE / ENSURE PLUS) liquid 237 mL, 237 mL, Oral, BID BM, Nadara Mustarduda, Marcus V, MD, 237 mL at 06/07/20 1756 .  HYDROmorphone (DILAUDID) injection 0.5 mg, 0.5 mg, Intravenous, Q4H PRN, Persons, [place], PA, 0.5 mg at 06/07/20 1116 .  insulin aspart (novoLOG) injection 0-15 Units, 0-15 Units, Subcutaneous, TID WC, Nadara Mustarduda, Marcus V, MD, 5 Units at 06/07/20 1754 .  insulin aspart (novoLOG) injection 4 Units, 4 Units, Subcutaneous, TID WC, Nadara Mustarduda, Marcus V, MD .  insulin NPH Human (NOVOLIN N) injection 18 Units, 18 Units, Subcutaneous, QHS, Persons, [place], [place], [place]18 Units at 06/07/20 2115 .  lisinopril (ZESTRIL) tablet 40 mg, 40 mg, Oral, Daily, Persons, [place], [place], 40 mg at 06/07/20 [id number] .  metoCLOPramide (REGLAN) tablet 5-10 mg, 5-10 mg, Oral, Q8H PRN **OR** metoCLOPramide (REGLAN) injection 5-10 mg, 5-10 mg, Intravenous, Q8H PRN, Persons, [place], PA .  metoprolol tartrate (LOPRESSOR) tablet 12.5 mg, 12.5 mg, Oral, BID, Persons, [place], PA, 12.5 mg at 06/07/20 2114 .  ondansetron (ZOFRAN) tablet 4 mg, 4 mg, Oral, Q6H PRN **OR** ondansetron (ZOFRAN) injection 4 mg, 4 mg, Intravenous, Q6H PRN, Persons, [place], PA .  oxyCODONE (Oxy IR/ROXICODONE) immediate release tablet 5-10 mg, 5-10 mg, Oral, Q4H PRN, Persons, [place], PA, 10 mg at 06/07/20 2136 .  predniSONE (DELTASONE) tablet 12.5 mg, 12.5 mg, Oral, Daily, Persons, [place], PA, 12.5 mg at 06/07/20 [id number] .   predniSONE (DELTASONE) tablet 2.5 mg, 2.5 mg, Oral, QHS, Nadara Mustarduda, Marcus V, MD, 2.5 mg at 06/07/20 2114  Patients Current Diet:     Diet Order                  Diet - low sodium heart healthy            Diet Carb Modified Fluid consistency: Thin; Room service appropriate? Yes  Diet effective now            Diet - low sodium heart healthy                  Precautions / Restrictions Precautions Precautions: Fall,Other (comment) Precaution Comments: BLE wound vacs, insulin pump Restrictions Weight Bearing Restrictions: Yes RLE Weight Bearing: Non weight bearing LLE Weight Bearing: Non weight bearing   Has the patient had 2 or more falls or a fall with injury in the past year? No  Prior Activity Level Limited Community (1-2x/wk): went out for appts  Prior Functional Level Self Care: Did the patient need help bathing, dressing, using the toilet or eating? Needed some help  Indoor Mobility: Did the patient need assistance with walking from room to room (with or without device)? Pt. Is non-ambulatory, uses power wheenchair  Stairs: Did the patient need assistance with internal or external stairs (with or without device)? Pt. Uses power  Prior Functional Level Self Care: Did the patient need help bathing, dressing, using the toilet or eating? Needed some help  Indoor Mobility: Did the patient need assistance with walking from room to room (with or without device)? Pt. Is non-ambulatory, uses power wheenchair  Stairs: Did the patient need assistance with internal or external stairs (with or without device)? Pt. Uses power wheelchiar  Functional Cognition: Did the patient need help planning regular tasks such as shopping or remembering to take medications? Needed some help  Home Assistive Devices / Equipment Home Assistive Devices/Equipment: Built-in shower seat,Wheelchair,Tub transfer bench Home Equipment: Walker - 2 wheels,Bedside commode,Wheelchair - manual,Wheelchair - power,Tub bench  Prior Device Use: Indicate devices/aids used by the patient prior to current illness, exacerbation or injury? Motorized wheelchair or scooter  Current Functional Level Cognition  Overall Cognitive Status: Within Functional Limits for tasks assessed Orientation Level: Oriented X4 General Comments: increased fear of falling, pain anticipation    Extremity Assessment (includes Sensation/Coordination)  Upper Extremity Assessment: Overall WFL  for tasks assessed RUE Deficits / Details: pt reports pain in shoulders and hands secondary to arthritis, limited shoulder ROM but otherwise WFL RUE Sensation: WNL RUE Coordination: WNL LUE Deficits / Details: pt reports pain in shoulders and hands secondary to arthritis, limited shoulder ROM but otherwise WFL;noted increased edema LUE Sensation: WNL LUE Coordination: WNL  Lower Extremity Assessment: Defer to PT evaluation RLE Deficits / Details: s/p AKA, wound vac in place LLE Deficits / Details: s/p AKA, wound vac in place    ADLs  Overall ADL's : Needs assistance/impaired Eating/Feeding: Independent,Sitting Grooming: Set up,Sitting,Bed level Upper Body Bathing: Moderate assistance Lower Body Bathing: Maximal assistance,Sitting/lateral leans Upper Body Dressing : Moderate assistance,Sitting Lower Body Dressing: Maximal assistance,Sitting/lateral leans Toilet Transfer Details (indicate cue type and reason): deferred Toileting - Clothing Manipulation Details (indicate cue type and reason): deferred General ADL Comments: limited by generalized deconditioning, pain, anxiety, mobility limitations secondary to bilat AKA    Mobility  Overal bed mobility: Needs Assistance Bed Mobility: Supine to Sit,Sit to Supine Rolling: +2 for physical assistance,Max assist Supine to sit: Mod assist,+2 for physical assistance Sit to supine: HOB elevated,Max assist,+2 for physical assistance General bed mobility comments: Pt performed rolling to R side for pericare and pad placement.  Pt required cues for hand placement and assistance to rotate hips during rolling.    Transfers  Overall transfer level: Needs assistance Equipment used: Sliding board Transfers: Lateral/Scoot Transfers  Lateral/Scoot Transfers: Mod assist,+2 physical assistance General transfer comment: Pt required support to keep her upright and avoid posterior bias.  PTA utilized bed pad to scoot from surface to surface.  Cues for  patient hand placement to assist with scooting.  Once in power chair and to scoot posterior with B arms.  In chair reviewed pressure relief techniques.    Ambulation / Gait / Stairs / Wheelchair Mobility  Ambulation/Gait Ambulation/Gait assistance:  (unable.  B AKA) General Gait Details: nonambulatory at baseline Wheelchair Mobility Wheelchair mobility: Yes Wheelchair propulsion: Right upper extremity Wheelchair parts: Needs assistance    Posture / Balance Dynamic Sitting Balance Sitting balance - Comments: Posterior bias Balance Overall balance assessment: Needs assistance Sitting-balance support: Bilateral upper extremity supported Sitting balance-Leahy Scale: Poor Sitting balance - Comments: Posterior bias    Special needs/care consideration Skin surgical incisions and Special service needs Pt. with power wheelchair from home: Diabetic Management: DM2 managed with sQ Novolog and Novolin N   Previous Home Environment (from acute therapy documentation) Living Arrangements: Spouse/significant other   of Home at Discharge: House Discharge Home Layout: One level Discharge Home Access: Ramped entrance Discharge Bathroom Shower/Tub: Walk-in shower Discharge Bathroom Toilet: Handicapped height Discharge Bathroom Accessibility: Yes How Accessible: Accessible via wheelchair Does the patient have any problems obtaining your medications?:  No  Social/Family/Support Systems Patient Roles: Spouse Contact Information: [phone] Anticipated Caregiver: Sharlot GowdaWayne Maertens Anticipated Caregiver's Contact Information: [phone] Ability/Limitations of Caregiver: Can provide min A Caregiver Availability: 24/7 Discharge Plan Discussed with Primary Caregiver: Yes Is Caregiver In Agreement with Plan?: Yes Does Caregiver/Family have Issues with Lodging/Transportation while Pt is in Rehab?: No  Goals Patient/Family Goal for Rehab: PT/OT Min A, wheelchair level Expected length of stay: 14-18 days Pt/Family Agrees to Admission and willing to participate: Yes Program Orientation Provided & Reviewed with Pt/Caregiver Including Roles  & Responsibilities: Yes  Decrease burden of Care through IP rehab admission: Patient/family education  Possible need for SNF placement upon discharge: not anticipated   Patient Condition: [facility], spoken with CM, and patient and spouse. I discussed via phone for inpatient rehabilitation assessment.  Patient will benefit from ongoing PT and OT, can actively participate in 3 hours of therapy a day 5 days of the week, and can make measurable gains during the admission.  Patient will also benefit from the coordinated team approach during an Inpatient Acute Rehabilitation admission.  The patient will receive intensive therapy as well as Rehabilitation physician, nursing, social worker, and care management interventions.  Due to safety, skin/wound care, disease management, medication administration and pain management the patient requires 24 hour a day rehabilitation nursing.  The patient is currently mod+2 A  with mobility and basic ADLs.  Discharge setting and therapy post discharge at home with home health is anticipated.  Patient has agreed to participate in the Acute Inpatient Rehabilitation Program and will admit today.  Preadmission Screen Completed By:   Jeronimo GreavesLaura B Synda Bagent, 06/08/2020 8:44 AM ______________________________________________________________________   Discussed status with Dr. Allena KatzPatel on 06/08/20  at 800 and received approval for admission today.  Admission Coordinator:  Jeronimo GreavesLaura B Mattix Imhof, CCC-SLP, time 800 Date 06/08/20   Assessment/Plan: Diagnosis: Bilateral AKAs due to wound dehiscence  1. Does the need for close, 24 hr/day Medical supervision in concert with the patient's rehab needs make it unreasonable for this patient to be served in a less intensive setting? Yes  2. Co-Morbidities requiring supervision/potential complications: RA with chronic prednisone,diastolic congestive heart failure, CAD,CKD, hypertension ,hyperlipidemia, diabetes mellituswith insulin pump and peripheral neuropathy.  3. Due to bowel management, safety, skin/wound care, disease management, pain management and patient education, does the patient require 24 hr/day rehab nursing? Yes 4. Does the patient require coordinated care of a physician, rehab nurse, PT, OT to address physical and functional deficits in the context of the above medical diagnosis(es)? Yes Addressing deficits in the following areas: balance, endurance, locomotion, strength, transferring, bathing, toileting and psychosocial support 5. Can the patient actively participate in an intensive therapy program of at least 3 hrs of therapy 5 days a week? Potentially 6. The potential for patient to make measurable gains while on inpatient rehab is excellent and good 7. Anticipated functional outcomes upon discharge from inpatient rehab: Min/Mod A at wheelchair level PT, Min/Mod A at wheelchair level OT, n/a SLP 8. Estimated rehab length of stay to reach the above functional goals is: 8-12 days. 9. Anticipated discharge destination: Home 10. Overall Rehab/Functional Prognosis: good and fair   MD Signature: Maryla MorrowAnkit Patel, MD, ABPMR

## 2020-06-08 NOTE — Progress Notes (Signed)
Patient ID: Kaitlin Hudson, female   DOB: 12/20/1955, 65 y.o.   MRN: [id number] Plan for discharge to inpatient rehab today.  Orders written.  Patient's insulin pump was discontinued during her acute hospital stay.  Patient had insufficient p.o. intake to use the pump.  Patient was placed on a regimen with NPH insulin and insulin coverage for meals.  Anticipate patient can resume the pump once her diet has returned to normal.  Patient's anticoagulation was also placed on hold during her acute hospital stay due to bleeding at the amputation sites.  Patient's wound VAC was discontinued secondary to patient's anxiety, discomfort with the dressing.

## 2020-06-08 NOTE — TOC Transition Note (Signed)
Transition of Care [facility]) - CM/SW Discharge Note   Patient Details  Name: Ramonia Mcclaran MRN: [id number] Date of Birth: 08-18-1955  Transition of Care [facility]) CM/SW Contact:  Kermit Balo, RN Phone Number: 06/08/2020, 12:02 PM   Clinical Narrative:    Patient discharging to CIR today. CM signing off.   Final next level of care: IP Rehab Facility Barriers to Discharge: No Barriers Identified   Patient Goals and CMS Choice        Discharge Placement                       Discharge Plan and Services                                     Social Determinants of Health (SDOH) Interventions     Readmission Risk Interventions Readmission Risk Prevention Plan 12/21/2019 08/17/2019  Post Dischage Appt - Complete  Medication Screening - Complete  Transportation Screening Complete Complete  PCP or Specialist Appt within 5-7 Days Not Complete -  Not Complete comments pt planning on CIR vs SNF -  Home Care Screening Complete -  Medication Review (RN CM) Referral to Pharmacy -  Some recent data might be hidden

## 2020-06-09 DIAGNOSIS — E1169 Type 2 diabetes mellitus with other specified complication: Secondary | ICD-10-CM | POA: Diagnosis not present

## 2020-06-09 DIAGNOSIS — D62 Acute posthemorrhagic anemia: Secondary | ICD-10-CM | POA: Diagnosis not present

## 2020-06-09 DIAGNOSIS — G8918 Other acute postprocedural pain: Secondary | ICD-10-CM

## 2020-06-09 DIAGNOSIS — Z89611 Acquired absence of right leg above knee: Secondary | ICD-10-CM

## 2020-06-09 DIAGNOSIS — I739 Peripheral vascular disease, unspecified: Secondary | ICD-10-CM | POA: Diagnosis not present

## 2020-06-09 DIAGNOSIS — E669 Obesity, unspecified: Secondary | ICD-10-CM

## 2020-06-09 DIAGNOSIS — K5901 Slow transit constipation: Secondary | ICD-10-CM

## 2020-06-09 DIAGNOSIS — Z89612 Acquired absence of left leg above knee: Secondary | ICD-10-CM

## 2020-06-09 LAB — CBC WITH DIFFERENTIAL/PLATELET
Abs Immature Granulocytes: 0.21 10*3/uL — ABNORMAL HIGH (ref 0.00–0.07)
Basophils Absolute: 0 10*3/uL (ref 0.0–0.1)
Basophils Relative: 0 %
Eosinophils Absolute: 0.1 10*3/uL (ref 0.0–0.5)
Eosinophils Relative: 1 %
HCT: 23.4 % — ABNORMAL LOW (ref 36.0–46.0)
Hemoglobin: 7.4 g/dL — ABNORMAL LOW (ref 12.0–15.0)
Immature Granulocytes: 1 %
Lymphocytes Relative: 10 %
Lymphs Abs: 1.5 10*3/uL (ref 0.7–4.0)
MCH: 26.1 pg (ref 26.0–34.0)
MCHC: 31.6 g/dL (ref 30.0–36.0)
MCV: 82.7 fL (ref 80.0–100.0)
Monocytes Absolute: 1.4 10*3/uL — ABNORMAL HIGH (ref 0.1–1.0)
Monocytes Relative: 9 %
Neutro Abs: 11.5 10*3/uL — ABNORMAL HIGH (ref 1.7–7.7)
Neutrophils Relative %: 79 %
Platelets: 283 10*3/uL (ref 150–400)
RBC: 2.83 MIL/uL — ABNORMAL LOW (ref 3.87–5.11)
RDW: 19.6 % — ABNORMAL HIGH (ref 11.5–15.5)
WBC: 14.6 10*3/uL — ABNORMAL HIGH (ref 4.0–10.5)
nRBC: 0.1 % (ref 0.0–0.2)

## 2020-06-09 LAB — GLUCOSE, CAPILLARY
Glucose-Capillary: 241 mg/dL — ABNORMAL HIGH (ref 70–99)
Glucose-Capillary: 240 mg/dL — ABNORMAL HIGH (ref 70–99)
Glucose-Capillary: 228 mg/dL — ABNORMAL HIGH (ref 70–99)
Glucose-Capillary: 223 mg/dL — ABNORMAL HIGH (ref 70–99)

## 2020-06-09 LAB — COMPREHENSIVE METABOLIC PANEL
ALT: 27 U/L (ref 0–44)
AST: 43 U/L — ABNORMAL HIGH (ref 15–41)
Albumin: 1.7 g/dL — ABNORMAL LOW (ref 3.5–5.0)
Alkaline Phosphatase: 63 U/L (ref 38–126)
Anion gap: 8 (ref 5–15)
BUN: 35 mg/dL — ABNORMAL HIGH (ref 8–23)
CO2: 17 mmol/L — ABNORMAL LOW (ref 22–32)
Calcium: 8.8 mg/dL — ABNORMAL LOW (ref 8.9–10.3)
Chloride: 112 mmol/L — ABNORMAL HIGH (ref 98–111)
Creatinine, Ser: 1.28 mg/dL — ABNORMAL HIGH (ref 0.44–1.00)
GFR, Estimated: 46 mL/min — ABNORMAL LOW (ref >60)
Glucose, Bld: 235 mg/dL — ABNORMAL HIGH (ref 70–99)
Potassium: 5.7 mmol/L — ABNORMAL HIGH (ref 3.5–5.1)
Sodium: 137 mmol/L (ref 135–145)
Total Bilirubin: 0.8 mg/dL (ref 0.3–1.2)
Total Protein: 4.8 g/dL — ABNORMAL LOW (ref 6.5–8.1)

## 2020-06-09 MED ORDER — METHOCARBAMOL 500 MG PO TABS
500.0000 mg | ORAL_TABLET | Freq: Four times a day (QID) | ORAL | Status: DC
  Administered 2020-06-09 – 2020-06-20 (×44): 500 mg via ORAL
  Filled 2020-06-09 (×44): qty 1

## 2020-06-09 MED ORDER — PROSOURCE PLUS PO LIQD
30.0000 mL | Freq: Every day | ORAL | Status: DC
  Administered 2020-06-10 – 2020-06-20 (×10): 30 mL via ORAL
  Filled 2020-06-09 (×11): qty 30

## 2020-06-09 MED ORDER — POLYETHYLENE GLYCOL 3350 17 G PO PACK
17.0000 g | PACK | Freq: Two times a day (BID) | ORAL | Status: DC
  Administered 2020-06-09 – 2020-06-20 (×14): 17 g via ORAL
  Filled 2020-06-09 (×21): qty 1

## 2020-06-09 MED ORDER — FE FUMARATE-B12-VIT C-FA-IFC PO CAPS
1.0000 | ORAL_CAPSULE | Freq: Two times a day (BID) | ORAL | Status: DC
  Administered 2020-06-09 – 2020-06-18 (×18): 1 via ORAL
  Filled 2020-06-09 (×18): qty 1

## 2020-06-09 MED ORDER — GLUCERNA SHAKE PO LIQD
237.0000 mL | ORAL | Status: DC
  Administered 2020-06-09: 237 mL via ORAL

## 2020-06-09 NOTE — Care Management (Signed)
Inpatient Rehabilitation Center Individual Statement of Services  Patient Name:  Mandy Peeks  Date:  06/09/2020  Welcome to the Inpatient Rehabilitation Center.  Our goal is to provide you with an individualized program based on your diagnosis and situation, designed to meet your specific needs.  With this comprehensive rehabilitation program, you will be expected to participate in at least 3 hours of rehabilitation therapies Monday-Friday, with modified therapy programming on the weekends.  Your rehabilitation program will include the following services:  Physical Therapy (PT) and Occupational Therapy (OT)  Weekly team conferences will be held on Tuesdays to discuss your progress.  Your Inpatient Rehabilitation Care Coordinator will talk with you frequently to get your input and to update you on team discussions.  Team conferences with you and your family in attendance may also be held.  Expected length of stay: 1-2 weeks  Overall anticipated outcome: Minimal Assistance to Supervision  Depending on your progress and recovery, your program may change. Your Inpatient Rehabilitation Care Coordinator will coordinate services and will keep you informed of any changes. Your Inpatient Rehabilitation Care Coordinator's name and contact numbers are listed  below.  The following services may also be recommended but are not provided by the Inpatient Rehabilitation Center:   Driving Evaluations  Home Health Rehabiltiation Services  Outpatient Rehabilitation Services  Vocational Rehabilitation   Arrangements will be made to provide these services after discharge if needed.  Arrangements include referral to agencies that provide these services.  Your insurance has been verified to be:  Medicare  Your primary doctor is:  Herbert Seta Spry  Pertinent information will be shared with your doctor and your insurance company.  Inpatient Rehabilitation Care Coordinator:  Susie Cassette  [phone] or [phone]  Information discussed with and copy given to patient by: Gretchen Short, 06/09/2020, 6:05 PM

## 2020-06-09 NOTE — Progress Notes (Signed)
PROGRESS NOTE   Subjective/Complaints: Pt in a lot of pain. No appetite. Not moving bowels. Left leg hurts more than right  ROS: Patient denies fever, rash, sore throat, blurred vision, nausea, vomiting, diarrhea, cough, shortness of breath or chest pain,  headache, or mood change.    Objective:   No results found. Recent Labs    06/07/20 1253 06/09/20 0538  WBC 17.1* 14.6*  HGB 7.6* 7.4*  HCT 24.3* 23.4*  PLT 215 283   Recent Labs    06/09/20 0538  NA 137  K 5.7*  CL 112*  CO2 17*  GLUCOSE 235*  BUN 35*  CREATININE 1.28*  CALCIUM 8.8*    Intake/Output Summary (Last 24 hours) at 06/09/2020 1023 Last data filed at 06/09/2020 0848 Gross per 24 hour  Intake --  Output 700 ml  Net -700 ml        Physical Exam: Vital Signs Blood pressure (!) 116/54, pulse 88, temperature 98.4 F (36.9 C), temperature source Oral, resp. rate 18, height 4\' 3"  (1.295 m), weight 80.4 kg, SpO2 100 %.  General: Alert and oriented x 3, obese HEENT: Head is normocephalic, atraumatic, PERRLA, EOMI, sclera anicteric, oral mucosa pink and moist, dentition intact, ext ear canals clear,  Neck: Supple without JVD or lymphadenopathy Heart: Reg rate and rhythm. No murmurs rubs or gallops Chest: CTA bilaterally without wheezes, rales, or rhonchi; no distress Abdomen: Soft, non-tender, non-distended, bowel sounds positive. Extremities: No clubbing, cyanosis, or edema. Pulses are 2+ Psych: flat Skin: wounds intact with sutures, serosang discharge from right more than left. All tissue looks very viable Neuro: Pt is cognitively appropriate with normal insight, memory, and awareness. Cranial nerves 2-12 are intact. Sensory exam is normal. Reflexes are 2+ in all 4's. Fine motor coordination is intact. No tremors. Motor function is grossly 5/5 UE and 3/5 LE R>L.  Musculoskeletal: both legs edematous, very tender to touch.       Assessment/Plan: 1. Functional deficits which require 3+ hours per day of interdisciplinary therapy in a comprehensive inpatient rehab setting.  Physiatrist is providing close team supervision and 24 hour management of active medical problems listed below.  Physiatrist and rehab team continue to assess barriers to discharge/monitor patient progress toward functional and medical goals  Care Tool:  Bathing              Bathing assist       Upper Body Dressing/Undressing Upper body dressing   What is the patient wearing?: Hospital gown only    Upper body assist      Lower Body Dressing/Undressing Lower body dressing            Lower body assist       Toileting Toileting    Toileting assist       Transfers Chair/bed transfer  Transfers assist           Locomotion Ambulation   Ambulation assist              Walk 10 feet activity   Assist           Walk 50 feet activity   Assist  Walk 150 feet activity   Assist           Walk 10 feet on uneven surface  activity   Assist           Wheelchair     Assist               Wheelchair 50 feet with 2 turns activity    Assist            Wheelchair 150 feet activity     Assist          Blood pressure (!) 116/54, pulse 88, temperature 98.4 F (36.9 C), temperature source Oral, resp. rate 18, height 4\' 3"  (1.295 m), weight 80.4 kg, SpO2 100 %.  Medical Problem List and Plan: 1.  Deficits with mobility, transfers, endurance, self-care secondary to wound dehiscence status post bilateral AKA.             Patient may shower with incisions covered             Goals/ELOS: Min/mod a at wheelchair level/8-12 days            begin therapies today 2.  DVT Prophylaxis/Anticoagulation, hx of DVT: Pharmaceutical:   -consider resuming eliquis 3/29--would like to see incisional bleeding slow down a bit.  hgb lower today         Antiplatelets:  Clopidogrel 75, ASA 81 3. Pain Management: Oxycodone as needed             Robaxin 500 3 times daily as needed             Cymbalta 60 daily             3/28 pain not controlled, but not using meds routinely   -will schedule robaxin 500mg  qid 4. Mood/anxiety/depression: Team to provide ongoing egosupport             Cymbalta 60 mg daily               5. Neuropsych: This patient is capable of making decisions on her own behalf. 6. Skin/Wound Care: dry dressings ordered for residual limbs 7. Fluids/Electrolytes/Nutrition: Routine I/OS             labs reviewed. Mild elevation of AST--likely reactive 8.  Vitamin D deficiency             Vitamin D 1000 units daily 9.  Slow transit constipation---mainly because not eating             Colace 100 mg twice daily             MiraLAX daily added 10.  Type 1 diabetes mellitus type II with hyperglycemia             SSI             Labile at present             Patient has insulin pump. This was resumed. She will adjust as needed. 11.  PVD             See #2  12.  Essential hypertension             Lopressor 12.5 twice daily, Bumex 2 mg daily  -controlled 3/28 13.  Hyperlipidemia             Zestril 40 mg daily 14.  Acute on chronic renal failure:  -BUN improved today--continue to push fluids  -recheck this week.  15.  CAD  See #2 16.  RA             Prednisone 12.5 mg daily, 2.5 mg nightly 17.  Leukocytosis             Afebrile             wbc's falling--14k today, steroid component 18.  Acute blood loss anemia             Hemoglobin 7.6 on 3/26---7.4 3/28             serial cbc's  -may need transfusion    LOS: 1 days A FACE TO FACE EVALUATION WAS PERFORMED  Ranelle Oyster 06/09/2020, 10:23 AM

## 2020-06-09 NOTE — Progress Notes (Signed)
Inpatient Rehabilitation  Patient information reviewed and entered into eRehab system by Melissa M. Bowie, M.A., CCC/SLP, PPS Coordinator.  Information including medical coding, functional ability and quality indicators will be reviewed and updated through discharge.    

## 2020-06-09 NOTE — Evaluation (Signed)
Occupational Therapy Assessment and Plan  Patient Details  Name: Kaitlin Hudson MRN: [id number] Date of Birth: [birth year]  OT Diagnosis: muscle weakness (generalized) and bilateral above knee amputations Rehab Potential:   ELOS: 2 weeks   Today's Date: 06/09/2020 OT Individual Time: 1100-1200 OT Individual Time Calculation (min): 60 min     Hospital Problem: Principal Problem:   S/P AKA (above knee amputation) bilateral ([place])   Past Medical History:  Past Medical History:  Diagnosis Date  . Anxiety   . Arthritis, rheumatoid ([place])   . Coronary artery disease   . Depression   . Diabetes mellitus without complication ([place])   . DVT (deep venous thrombosis) ([place])    Left leg 10/2019  . Headache   . History of blood transfusion   . Hyperlipidemia   . Hypertension   . Latent tuberculosis    pt denies this dx  . Peripheral vascular disease ([place])    blood clot in left leg  . Type 1 diabetes ([place])    Past Surgical History:  Past Surgical History:  Procedure Laterality Date  . AMPUTATION Bilateral 12/19/2019   Procedure: BILATERAL BELOW KNEE AMPUTATION;  Surgeon: Newt Minion, MD;  Location: [facility];  Service: Orthopedics;  Laterality: Bilateral;  . AMPUTATION Bilateral 06/04/2020   Procedure: BILATERAL ABOVE KNEE AMPUTATION;  Surgeon: Newt Minion, MD;  Location: [facility];  Service: Orthopedics;  Laterality: Bilateral;  . APPLICATION OF WOUND VAC Bilateral 06/04/2020   Procedure: APPLICATION OF WOUND VAC;  Surgeon: Newt Minion, MD;  Location: [facility];  Service: Orthopedics;  Laterality: Bilateral;  . CARDIAC CATHETERIZATION  11/02/2019  . CARPAL TUNNEL RELEASE Bilateral   . EYE SURGERY Bilateral    09/2018  . FINGER SURGERY  07/11/2014   right middle finger - nodule removed  . FINGER SURGERY  02/20/2017   right middle finger  . FOOT SURGERY Right 02/26/2014   screw  . I & D EXTREMITY Right 08/15/2019   Procedure: RIGHT ACHILLES DEBRIDEMENT, POSSIBLE SKIN GRAFT;  Surgeon: Newt Minion, MD;  Location: [facility];  Service: Orthopedics;  Laterality: Right;  . JOINT REPLACEMENT    . SHOULDER SURGERY Right 01/06/2005  . TRIGGER FINGER RELEASE  12/202002   left hand -   . WRIST SURGERY Left 10/23/2004   and middle finger  . WRIST SURGERY Right 11/05/2005  . WRIST SURGERY Right 03/11/2006   cyst removed    Assessment & Plan Clinical Impression: Patient is a 65 y.o. year old female with past medical history/past surgical history of type 1 diabetes with insulin pump, PVD, essential hypertension, hyperlipidemia, headaches, DVT, depression, CAD, RA, anxiety, bilateral wrist surgeries, trigger finger release, right shoulder surgery, right foot surgery, bilateral carpal tunnel release, bilateral BKAs in 12/2019 presented on 06/04/2020 with bilateral BKA wound dehiscence status post bilateral AKAs.  History taken from chart review, husband, and patient.  Patient was noted to have venous and lymphatic insufficiency with significant edema and appears to have had nonhealing surgical incisions.  She also had increasing burning pain.  She was seen by Ortho and decided to proceed with bilateral AKAs per Dr. Sharol Given on 6/37/8588 with application of wound VAC, which was later [place] due to intolerance.  Hospital course has been complicated by postoperative pain, labile blood sugars, leukocytosis, acute blood loss anemia, as well as anxiety.   Patient transferred to CIR on 06/08/2020 .    Patient currently requires max with basic self-care skills secondary to muscle weakness, decreased cardiorespiratoy  endurance, decreased coordination and decreased sitting balance and decreased postural control.  Prior to hospitalization, patient could complete adl/transfers with min.  Patient will benefit from skilled intervention to decrease level of assist with basic self-care skills and increase independence with basic self-care skills prior to discharge home with care partner.  Anticipate patient will require  minimal physical assistance and follow up home health.  OT - End of Session Activity Tolerance: Tolerates 10 - 20 min activity with multiple rests Endurance Deficit: Yes Endurance Deficit Description: fatigue with adl tasks bed level OT Assessment OT Patient demonstrates impairments in the following area(s): Balance;Endurance;Pain OT Basic ADL's Functional Problem(s): Grooming;Bathing;Dressing;Toileting OT Transfers Functional Problem(s): Toilet OT Plan OT Intensity: Minimum of 1-2 x/day, 45 to 90 minutes OT Frequency: 5 out of 7 days OT Duration/Estimated Length of Stay: 2 weeks OT Treatment/Interventions: Balance/vestibular training;Self Care/advanced ADL retraining;Therapeutic Exercise;Wheelchair propulsion/positioning;DME/adaptive equipment instruction;Pain management;Skin care/wound managment;UE/LE Strength taining/ROM;Patient/family education;Discharge planning;Functional mobility training;Therapeutic Activities OT Self Feeding Anticipated Outcome(s): mod I OT Basic Self-Care Anticipated Outcome(s): set up/mod I OT Toileting Anticipated Outcome(s): min a OT Bathroom Transfers Anticipated Outcome(s): min A OT Recommendation Patient destination: Home Follow Up Recommendations: Home health OT Equipment Details: has DME from prior admission - will determine appropriateness with new situation   OT Evaluation Precautions/Restrictions  Precautions Precautions: Fall Precaution Comments: dressings difficult to maintain, shrinkers to be applied. Restrictions Weight Bearing Restrictions: Yes RLE Weight Bearing: Non weight bearing LLE Weight Bearing: Non weight bearing General   Vital Signs Therapy Vitals Temp: 98.4 F (36.9 C) Temp Source: Oral Pulse Rate: 84 Resp: 20 BP: 118/65 Oxygen Therapy SpO2: 100 % O2 Device: Room Air Pain Pain Assessment Pain Scale: 0-10 Pain Score: 3  Pain Type: Surgical pain Pain Location: Leg Pain Orientation: Right;Left Pain Descriptors /  Indicators: Tender Pain Frequency: Constant Pain Onset: On-going Patients Stated Pain Goal: 2 Pain Intervention(s): Repositioned Multiple Pain Sites: No Home Living/Prior Functioning Home Living Family/patient expects to be discharged to:: Private residence Living Arrangements: Spouse/significant other Available Help at Discharge: Family,Available 24 hours/day Type of Home: House Home Access: Ramped entrance Home Layout: One level Bathroom Shower/Tub: Multimedia programmer: Standard Bathroom Accessibility: Yes Additional Comments: spouse very supportive and has been assisting with medical care   Lives With: Spouse Prior Function Level of Independence: Needs assistance with ADLs,Needs assistance with tranfers Bath: Minimal Toileting: Supervision/set-up Dressing: Supervision/set-up  Able to Take Stairs?: No Comments: Has w/c transport van. Vision Patient Visual Report: No change from baseline Vision Assessment?: No apparent visual deficits Perception  Perception: Within Functional Limits Praxis Praxis: Intact Cognition Overall Cognitive Status: Within Functional Limits for tasks assessed Arousal/Alertness: Awake/alert Orientation Level: Person;Place;Situation Person: Oriented Place: Oriented Situation: Oriented Year: 2022 Month: March Day of Week: Correct Memory: Appears intact Immediate Memory Recall: Sock;Blue;Bed Memory Recall Sock: Without Cue Memory Recall Blue: Without Cue Memory Recall Bed: Without Cue Awareness: Appears intact Problem Solving: Appears intact Safety/Judgment: Appears intact Sensation Sensation Light Touch: Not tested (B AKA) Additional Comments: UB sensation grossly intact Coordination Gross Motor Movements are Fluid and Coordinated: No Fine Motor Movements are Fluid and Coordinated: No Coordination and Movement Description: able to manipulate objects and reach to perform adl tasks Finger Nose Finger Test: [facility] Heel Shin Test:  N/A Motor  Motor Motor: Abnormal postural alignment and control Motor - Skilled Clinical Observations: decreased seated balance 2/2 changed COG, BOS.  Trunk/Postural Assessment  Lumbar Assessment Lumbar Assessment: Exceptions to [facility] Lumbar Strength Overall Lumbar Strength: Other (comment) Overall Lumbar Strength  Comments: decreased balance 2/2 changing COG, body habitus. Lumbar Flexion: 3-/5 Postural Control Postural Control: Deficits on evaluation Trunk Control: unable to maintain upright position w/o external support.  Balance Balance Balance Assessed: Yes Dynamic Sitting Balance Sitting balance - Comments: Posterior bias Extremity/Trunk Assessment RUE Assessment RUE Assessment: Within Functional Limits General Strength Comments: hx of arthritis and right middle finger flexion deformity LUE Assessment LUE Assessment: Within Functional Limits General Strength Comments: hx of arthritis  Care Tool Care Tool Self Care Eating   Eating Assist Level: Set up assist    Oral Care    Oral Care Assist Level: Set up assist    Bathing   Body parts bathed by patient: Right arm;Left arm;Chest;Abdomen;Front perineal area;Right upper leg;Left upper leg;Face Body parts bathed by helper: Buttocks;Right upper leg;Left upper leg Body parts n/a: Right lower leg;Left lower leg Assist Level: Moderate Assistance - Patient 50 - 74%    Upper Body Dressing(including orthotics)   What is the patient wearing?: Pull over shirt   Assist Level: Minimal Assistance - Patient > 75%    Lower Body Dressing (excluding footwear)   What is the patient wearing?: Pants;Incontinence brief Assist for lower body dressing: Maximal Assistance - Patient 25 - 49%    Putting on/Taking off footwear Putting on/taking off footwear activity did not occur: N/A           Care Tool Toileting Toileting activity   Assist for toileting: Total Assistance - Patient < 25%     Care Tool Bed Mobility Roll left and  right activity   Roll left and right assist level: Moderate Assistance - Patient 50 - 74%    Sit to lying activity   Sit to lying assist level: Moderate Assistance - Patient 50 - 74%    Lying to sitting edge of bed activity   Lying to sitting edge of bed assist level: Moderate Assistance - Patient 50 - 74%     Care Tool Transfers Sit to stand transfer Sit to stand activity did not occur: N/A      Chair/bed transfer   Chair/bed transfer assist level: Maximal Assistance - Patient 25 - 49%     Toilet transfer         Care Tool Cognition Expression of Ideas and Wants Expression of Ideas and Wants: Without difficulty (complex and basic) - expresses complex messages without difficulty and with speech that is clear and easy to understand   Understanding Verbal and Non-Verbal Content Understanding Verbal and Non-Verbal Content: Understands (complex and basic) - clear comprehension without cues or repetitions   Memory/Recall Ability *first 3 days only Memory/Recall Ability *first 3 days only: Current season;Staff names and faces;That he or she is in a hospital/hospital unit    Refer to Care Plan for [place] 1 OT Short Term Goal 1 (Week 1): patient will complete rolling in bed with CS, supine to sitting with mod A OT Short Term Goal 2 (Week 1): patient will increase anterior/posterior transfer to/from commode, bed, and w/c with mod A of one OT Short Term Goal 3 (Week 1): patient will complete adl tasks with set up/min A OT Short Term Goal 4 (Week 1): patient will complete toileting commode level with max A of one  Recommendations for other services: None    Skilled Therapeutic Intervention   Patient in bed, alert.  She is able to recall prior rehab stay and demonstrates good understanding of what to expect.  She is tearful  t/o session as she is concerned about mobility and states that "she is ready for God to take her home"   She states that she has  spoken with the Chaplain during this stay and knows that God has his own plan.  Reviewed role of OT at this level of care and discussed goals for therapy.   She participates fully in evaluation process and adl training as documented above and below.  She presents with impaired strength and sitting balance limiting her ability to move in bed, transfer and perform adl tasks.  Discussed plan of care and options for mobility and task completion.  She remained in bed after adl tasks completed bed level due to fatigue.  Bed alarm set and call bell in hand.  She is an excellent candidate for IP rehab to maximize functional independence and facilitate a safe return to home with husband and accessible home environment.     ADL ADL Eating: Set up Where Assessed-Eating: Bed level Grooming: Setup Where Assessed-Grooming: Bed level Upper Body Bathing: Setup Where Assessed-Upper Body Bathing: Bed level Lower Body Bathing: Maximal assistance Where Assessed-Lower Body Bathing: Bed level Upper Body Dressing: Minimal assistance Where Assessed-Upper Body Dressing: Bed level Lower Body Dressing: Maximal assistance Where Assessed-Lower Body Dressing: Bed level Toileting: Maximal assistance Where Assessed-Toileting: Bed level ADL Comments: ADL bed level this session Mobility  Bed Mobility Bed Mobility: Rolling Right;Rolling Left Rolling Right: Moderate Assistance - Patient 50-74% Rolling Left: Moderate Assistance - Patient 50-74%   Discharge Criteria: Patient will be discharged from OT if patient refuses treatment 3 consecutive times without medical reason, if treatment goals not met, if there is a change in medical status, if patient makes no progress towards goals or if patient is discharged from hospital.  The above assessment, treatment plan, treatment alternatives and goals were discussed and mutually agreed upon: by patient  Carlos Levering 06/09/2020, 4:35 PM

## 2020-06-09 NOTE — IPOC Note (Signed)
Overall Plan of Care [facility]) Patient Details Name: Kaitlin Hudson MRN: [id number] DOB: [birth year]  Admitting Diagnosis: S/P AKA (above knee amputation) bilateral [facility])  Hospital Problems: Principal Problem:   S/P AKA (above knee amputation) bilateral ([facility])     Functional Problem List: Nursing Behavior,Edema,Endurance,Medication Management,Nutrition,Pain,Perception,Safety,Sensory,Skin Integrity  PT Balance,Pain,Safety,Endurance,Motor  OT Balance,Endurance,Pain  SLP    TR         Basic ADL's: OT Grooming,Bathing,Dressing,Toileting     Advanced  ADL's: OT       Transfers: PT Bed Mobility,Bed to [employer]  OT Toilet     Locomotion: PT Wheelchair Mobility     Additional Impairments: OT    SLP        TR      Anticipated Outcomes Item Anticipated Outcome  Self Feeding mod I  Swallowing      Basic self-care  set up/mod I  Toileting  min a   Bathroom Transfers min A  Bowel/Bladder  mod I  Transfers  min A bed <> w/c  Locomotion  500' w/ power chair, mod I  Communication     Cognition     Pain  pain less than 2  Safety/Judgment  mod I   Therapy Plan: PT Intensity: Minimum of 1-2 x/day ,45 to 90 minutes PT Duration Estimated Length of Stay: 1-2 weeks OT Intensity: Minimum of 1-2 x/day, 45 to 90 minutes OT Frequency: 5 out of 7 days OT Duration/Estimated Length of Stay: 2 weeks     Due to the current state of emergency, patients may not be receiving their 3-hours of Medicare-mandated therapy.   Team Interventions: Nursing Interventions Patient/Family Education,Pain Management,Skin Care/Wound Management,Disease Management/Prevention,Discharge Planning,Medication Management  PT interventions Discharge planning,Functional mobility training,Therapeutic Activities,UE/LE Strength taining/ROM,Wheelchair propulsion/positioning,UE/LE Coordination activities,Therapeutic Exercise,Neuromuscular re-education,Patient/family education  OT Interventions  Balance/vestibular training,Self Care/advanced ADL retraining,Therapeutic Exercise,Wheelchair propulsion/positioning,DME/adaptive equipment instruction,Pain management,Skin care/wound managment,UE/LE Strength taining/ROM,Patient/family education,Discharge planning,Functional mobility training,Therapeutic Activities  SLP Interventions    TR Interventions    SW/CM Interventions Discharge Planning,Psychosocial Support,Patient/Family Education   Barriers to Discharge MD  Medical stability  Nursing Inaccessible home environment,Decreased caregiver support,Home environment access/layout,Wound Care,Lack of/limited family support,Medication compliance,Weight bearing restrictions,Behavior    PT Wound Care    OT      SLP      SW       Team Discharge Planning: Destination: PT-Home ,OT- Home , SLP-  Projected Follow-up: PT-Home health PT, OT-  Home health OT, SLP-  Projected Equipment Needs: PT-To be determined, OT-  , SLP-  Equipment Details: PT- , OT-has DME from prior admission - will determine appropriateness with new situation Patient/family involved in discharge planning: PT- Patient,Family member/caregiver,  OT-Patient, SLP-   MD ELOS: 7-11 days Medical Rehab Prognosis:  Excellent Assessment: The patient has been admitted for CIR therapies with the diagnosis of bilateral AKA's. The team will be addressing functional mobility, strength, stamina, balance, safety, adaptive techniques and equipment, self-care, bowel and bladder mgt, patient and caregiver education, pain mgt, stump care/wound care, ego support. Goals have been set at min assist to mod I at powered w/c level for mobility and self-care.   Due to the current state of emergency, patients may not be receiving their 3 hours per day of Medicare-mandated therapy.    Ranelle Oyster, MD, FAAPMR      See Team Conference Notes for weekly updates to the plan of care

## 2020-06-09 NOTE — Evaluation (Signed)
Physical Therapy Assessment and Plan  Patient Details  Name: Kaitlin Hudson MRN: [id number] Date of Birth: 03-19-1955  PT Diagnosis: Abnormal posture, Edema and Muscle weakness Rehab Potential: Good ELOS: 1-2 weeks   Today's Date: 06/09/2020 PT Individual Time: 1330-1430 PT Individual Time Calculation (min): 60 min    Hospital Problem: Principal Problem:   S/P AKA (above knee amputation) bilateral ([place])   Past Medical History:  Past Medical History:  Diagnosis Date  . Anxiety   . Arthritis, rheumatoid ([place])   . Coronary artery disease   . Depression   . Diabetes mellitus without complication ([place])   . DVT (deep venous thrombosis) ([place])    Left leg 10/2019  . Headache   . History of blood transfusion   . Hyperlipidemia   . Hypertension   . Latent tuberculosis    pt denies this dx  . Peripheral vascular disease ([place])    blood clot in left leg  . Type 1 diabetes ([place])    Past Surgical History:  Past Surgical History:  Procedure Laterality Date  . AMPUTATION Bilateral 12/19/2019   Procedure: BILATERAL BELOW KNEE AMPUTATION;  Surgeon: Newt Minion, MD;  Location: [place];  Service: Orthopedics;  Laterality: Bilateral;  . AMPUTATION Bilateral 06/04/2020   Procedure: BILATERAL ABOVE KNEE AMPUTATION;  Surgeon: Newt Minion, MD;  Location: [place];  Service: Orthopedics;  Laterality: Bilateral;  . APPLICATION OF WOUND VAC Bilateral 06/04/2020   Procedure: APPLICATION OF WOUND VAC;  Surgeon: Newt Minion, MD;  Location: [place];  Service: Orthopedics;  Laterality: Bilateral;  . CARDIAC CATHETERIZATION  11/02/2019  . CARPAL TUNNEL RELEASE Bilateral   . EYE SURGERY Bilateral    09/2018  . FINGER SURGERY  07/11/2014   right middle finger - nodule removed  . FINGER SURGERY  02/20/2017   right middle finger  . FOOT SURGERY Right 02/26/2014   screw  . I & D EXTREMITY Right 08/15/2019   Procedure: RIGHT ACHILLES DEBRIDEMENT, POSSIBLE SKIN GRAFT;  Surgeon: Newt Minion, MD;  Location:  [place];  Service: Orthopedics;  Laterality: Right;  . JOINT REPLACEMENT    . SHOULDER SURGERY Right 01/06/2005  . TRIGGER FINGER RELEASE  12/202002   left hand -   . WRIST SURGERY Left 10/23/2004   and middle finger  . WRIST SURGERY Right 11/05/2005  . WRIST SURGERY Right 03/11/2006   cyst removed    Assessment & Plan Clinical Impression: Female with past medical history/past surgical history of type 1 diabetes with insulin pump, PVD, essential hypertension, hyperlipidemia, headaches, DVT, depression, CAD, RA, anxiety, bilateral wrist surgeries, trigger finger release, right shoulder surgery, right foot surgery, bilateral carpal tunnel release, bilateral BKAs in 12/2019 presented on 06/04/2020 with bilateral BKA wound dehiscence status post bilateral AKAs.  History taken from chart review, husband, and patient.  Patient was noted to have venous and lymphatic insufficiency with significant edema and appears to have had nonhealing surgical incisions.  She also had increasing burning pain.  She was seen by Ortho and decided to proceed with bilateral AKAs per Dr. Sharol Given on 2/59/5638 with application of wound VAC, which was later [place] due to intolerance.  Hospital course has been complicated by postoperative pain, labile blood sugars, leukocytosis, acute blood loss anemia, as well as anxiety.  She was seen by therapies and CIR recommended due to decreasing burden of care as well as improving functional mobility at wheelchair level.  Please see preadmission assessment from earlier today as well.  Patient currently  requires max with mobility secondary to muscle weakness and decreased sitting balance, decreased postural control and decreased balance strategies.  Prior to hospitalization, patient was supervision with mobility and lived with Spouse in a House home.  Home access is  Ramped entrance.  Patient will benefit from skilled PT intervention to maximize safe functional mobility, minimize fall risk and  decrease caregiver burden for planned discharge home with 24 hour assist.  Anticipate patient will benefit from follow up [facility] at discharge.  PT - End of Session Activity Tolerance: Tolerates 10 - 20 min activity with multiple rests Endurance Deficit: Yes PT Assessment Rehab Potential (ACUTE/IP ONLY): Good PT Barriers to Discharge: Wound Care PT Patient demonstrates impairments in the following area(s): Balance;Pain;Safety;Endurance;Motor PT Transfers Functional Problem(s): Bed Mobility;Bed to Chair;Car PT Locomotion Functional Problem(s): Wheelchair Mobility PT Plan PT Intensity: Minimum of 1-2 x/day ,45 to 90 minutes PT Duration Estimated Length of Stay: 1-2 weeks PT Treatment/Interventions: Discharge planning;Functional mobility training;Therapeutic Activities;UE/LE Strength taining/ROM;Wheelchair propulsion/positioning;UE/LE Coordination activities;Therapeutic Exercise;Neuromuscular re-education;Patient/family education PT Transfers Anticipated Outcome(s): min A bed <> w/c PT Locomotion Anticipated Outcome(s): 500' w/ power chair, mod I PT Recommendation Recommendations for Other Services: Neuropsych consult Follow Up Recommendations: Home health PT Patient destination: Home Equipment Recommended: To be determined   PT Evaluation Precautions/Restrictions Precautions Precautions: Fall Precaution Comments: dressings difficult to maintain, shrinkers to be applied. Restrictions Weight Bearing Restrictions: Yes RLE Weight Bearing: Non weight bearing LLE Weight Bearing: Non weight bearing General Chart Reviewed: Yes Family/Caregiver Present: Yes Vital SignsTherapy Vitals Temp: 98.4 F (36.9 C) Temp Source: Oral Pulse Rate: 84 Resp: 20 BP: 118/65 Oxygen Therapy SpO2: 100 % O2 Device: Room Air Pain Pain Assessment Pain Scale: 0-10 Pain Score: 5  Pain Type: Surgical pain Pain Location: Leg (Bilateral AKA L > R) Pain Orientation: Right;Left Pain Descriptors / Indicators:  Discomfort;Burning Pain Frequency: Constant Pain Onset: On-going Patients Stated Pain Goal: 2 Pain Intervention(s): Medication (See eMAR) Multiple Pain Sites: No Home Living/Prior Functioning Home Living Available Help at Discharge: Family;Available 24 hours/day Type of Home: House Home Access: Ramped entrance Home Layout: One level Bathroom Shower/Tub: Walk-in shower Additional Comments: spouse very supportive and has been assisting with medical care   Lives With: Spouse Prior Function Level of Independence: Other (comment) (pt able to transfer self from surface to surface using SB.)  Able to Take Stairs?: No Comments: Has w/c transport van. Vision/Perception     Cognition Overall Cognitive Status: Within Functional Limits for tasks assessed Orientation Level: Oriented X4 Sensation Sensation Light Touch: Not tested (B AKA) Coordination Gross Motor Movements are Fluid and Coordinated: No Fine Motor Movements are Fluid and Coordinated: No Heel Shin Test: N/A Motor  Motor Motor: Abnormal postural alignment and control Motor - Skilled Clinical Observations: decreased seated balance 2/2 changed COG, BOS.   Trunk/Postural Assessment  Lumbar Assessment Lumbar Assessment: Exceptions to [facility] Lumbar Strength Overall Lumbar Strength: Other (comment) Overall Lumbar Strength Comments: decreased balance 2/2 changing COG, body habitus. Lumbar Flexion: 3-/5 Postural Control Postural Control: Deficits on evaluation Trunk Control: unable to maintain upright position w/o external support.  Balance Balance Balance Assessed: Yes Dynamic Sitting Balance Sitting balance - Comments: Posterior bias Extremity Assessment      RLE Assessment RLE Assessment: Exceptions to [facility] General Strength Comments: grossly 2+/5 hip only. LLE Assessment LLE Assessment: Exceptions to [facility] General Strength Comments: grossly 2/5 hip only.  Care Tool Care Tool Bed Mobility Roll left and right activity    Roll left and right assist level: Moderate  Assistance - Patient 50 - 74%    Sit to lying activity   Sit to lying assist level: Moderate Assistance - Patient 50 - 74%    Lying to sitting edge of bed activity   Lying to sitting edge of bed assist level: Moderate Assistance - Patient 50 - 74%     Care Tool Transfers Sit to stand transfer Sit to stand activity did not occur: N/A      Chair/bed transfer   Chair/bed transfer assist level: Maximal Assistance - Patient 25 - 49%     Psychologist, counselling transfer activity did not occur: Safety/medical concerns        Care Tool Locomotion Ambulation Ambulation activity did not occur: N/A        Walk 10 feet activity         Walk 50 feet with 2 turns activity        Walk 150 feet activity        Walk 10 feet on uneven surfaces activity Walk 10 feet on uneven surfaces activity did not occur: N/A      Stairs Stair activity did not occur: N/A        Walk up/down 1 step activity          Walk up/down 4 steps activity      Walk up/down 12 steps activity        Pick up small objects from floor Pick up small object from the floor (from standing position) activity did not occur: N/A      Wheelchair Will patient use wheelchair at discharge?: Yes Type of Wheelchair: Power   Wheelchair assist level: Supervision/Verbal cueing Max wheelchair distance: 300  Wheel 50 feet with 2 turns activity   Assist Level: Supervision/Verbal cueing  Wheel 150 feet activity   Assist Level: Supervision/Verbal cueing    Refer to Care Plan for Long Term Goals  SHORT TERM GOAL WEEK 1 PT Short Term Goal 1 (Week 1): Pt will roll side to side using side rails w/ min A PT Short Term Goal 2 (Week 1): Pt will transfers sup to long sit w/ mod A PT Short Term Goal 3 (Week 1): Pt will transfer bed<> power chair w/ mod A, anterior-posterior or SB  Recommendations for other services: Neuropsych  Skilled Therapeutic  Intervention Evaluation completed (see details above and below) with education on PT POC and goals and individual treatment initiated with focus on bed mobilty, transfers.  Pt presents supine in bed and reluctantly agreeable to therapy.  Pt required max A and use of side rails to pull into long sitting and then turn to perform posterior scoot into power w/c.  Pt negotiated w/c in hallways w/ supervision.  Pt has transport van and does not need car transfer.  Max A for anterior scoot back to bed and then to turn for lowering to supine.  Pt somewhat tearful but improved from AM session.  Pt rolled w/ cueing for use of contralateral residual limb to increase roll for removing and replacing chux sheet.  Pt remained in bed and alarm on w/ all needs in reach.  Spouse present.  y Bed Mobility Bed Mobility: Rolling Right;Rolling Left Rolling Right: Moderate Assistance - Patient 50-74% Rolling Left: Moderate Assistance - Patient 50-74% Transfers Transfers: Government social research officer Transfer: Maximal Assistance - Patient 25-49% (anterior requires increased assist.) Locomotion  Gait Ambulation: No Gait Gait: No Wheelchair Mobility Wheelchair Mobility: Yes Wheelchair  Assistance: Supervision/Verbal Herbalist: Power Wheelchair Parts Management: Needs assistance Distance: 300'   Discharge Criteria: Patient will be discharged from PT if patient refuses treatment 3 consecutive times without medical reason, if treatment goals not met, if there is a change in medical status, if patient makes no progress towards goals or if patient is discharged from hospital.  The above assessment, treatment plan, treatment alternatives and goals were discussed and mutually agreed upon: by patient and by family  Ladoris Gene 06/09/2020, 4:12 PM

## 2020-06-09 NOTE — Progress Notes (Signed)
Physical Therapy Session Note  Patient Details  Name: Kaitlin Hudson MRN: [id number] Date of Birth: 1955-03-16  Today's Date: 06/09/2020 PT Individual Time: 1500-1527 PT Individual Time Calculation (min): 27 min   Short Term Goals: Week 1:     Skilled Therapeutic Interventions/Progress Updates:    pt received in bed and agreeable to therapy, husband present. Pt reporting "some" pain at start of session and 8/10 with activity. Pt directed in bed exercises reporting she was too tired to sit EOB. Pt directed in x5 hip flexion of BLE however had to stop 2/2 pain, x4 hip abduction on BLE and stopped 2/2 pain. Pt requested nursing assist in pain management. Nursing made aware and gave medication at bedside. Pt left in bed, All needs in reach and in good condition. Call light in hand.  Alarm set and husband present. Pt required extra time during session for repositioning for improved comfort and pt educated on scheduling for therapy.   Therapy Documentation Precautions:  Precautions Precautions: Fall Precaution Comments: dressings difficult to maintain, shrinkers to be applied. Restrictions Weight Bearing Restrictions: Yes RLE Weight Bearing: Non weight bearing LLE Weight Bearing: Non weight bearing General: Chart Reviewed: Yes Family/Caregiver Present: Yes Vital Signs: Therapy Vitals Temp: 98.4 F (36.9 C) Temp Source: Oral Pulse Rate: 84 Resp: 20 BP: 118/65 Oxygen Therapy SpO2: 100 % O2 Device: Room Air Pain: Pain Assessment Pain Scale: 0-10 Pain Score: 5  Pain Type: Surgical pain Pain Location: Leg (Bilateral AKA L > R) Pain Orientation: Right;Left Pain Descriptors / Indicators: Discomfort;Burning Pain Frequency: Constant Pain Onset: On-going Patients Stated Pain Goal: 2 Pain Intervention(s): Medication (See eMAR) Multiple Pain Sites: No Mobility: Bed Mobility Bed Mobility: Rolling Right;Rolling Left Rolling Right: Moderate Assistance - Patient 50-74% Rolling Left:  Moderate Assistance - Patient 50-74% Transfers Transfers: Counselling psychologist Transfer: Maximal Assistance - Patient 25-49% (anterior requires increased assist.) Locomotion : Gait Ambulation: No Gait Gait: No Wheelchair Mobility Wheelchair Mobility: Yes Wheelchair Assistance: Doctor, general practice: Power Wheelchair Parts Management: Needs assistance Distance: 300'  Trunk/Postural Assessment : Lumbar Assessment Lumbar Assessment: Exceptions to [facility] Lumbar Strength Overall Lumbar Strength: Other (comment) Overall Lumbar Strength Comments: decreased balance 2/2 changing COG, body habitus. Lumbar Flexion: 3-/5 Postural Control Postural Control: Deficits on evaluation Trunk Control: unable to maintain upright position w/o external support.  Balance: Balance Balance Assessed: Yes Dynamic Sitting Balance Sitting balance - Comments: Posterior bias Exercises:   Other Treatments:      Therapy/Group: Individual Therapy  Barbaraann Faster 06/09/2020, 3:59 PM

## 2020-06-09 NOTE — Progress Notes (Signed)
Initial Nutrition Assessment  DOCUMENTATION CODES:   Not applicable  INTERVENTION:   - Glucerna Shake po daily, each supplement provides 220 kcal and 10 grams of protein  - ProSource Plus 30 ml po daily, each supplement provides 100 kcal and 15 grams of protein  - Liberalize diet to Carb Modified per discussion with MD  - Continue daily MVI  - Family to bring in [employer] protein shakes from home, each shake provides 240 kcal and 26 grams of protein  NUTRITION DIAGNOSIS:   Increased nutrient needs related to wound healing as evidenced by estimated needs.  GOAL:   Patient will meet greater than or equal to 90% of their needs  MONITOR:   PO intake,Supplement acceptance,Labs,Weight trends,Skin  REASON FOR ASSESSMENT:   Malnutrition Screening Tool    ASSESSMENT:   65 year old female with PMH of T1DM with insulin pump, PVD, HTN, HLD, DVT, depression, CAD, RA, anxiety, bilateral wrist surgeries, trigger finger release, right shoulder surgery, right foot surgery, bilateral carpal tunnel release, bilateral BKAs in 12/2019. Pt presented on 06/04/20 with bilateral BKA wound dehiscence. Pt was seen by Ortho and decided to proceed with bilateral AKAs on 06/04/20. Admitted to CIR on 3/27.   Spoke with pt and husband at bedside. Pt reports poor appetite and PO intake that began when she was admitted to CIR.  Pt and husband report that PTA pt had a good appetite and consumed 3 meals daily. Breakfast and lunch were lighter meals and supper was more substantial.  Pt reports that since admission she has had very little appetite. She is able to eat bites of meals but then food starts to "get bigger in my mouth" the more she chews and she just can't swallow it.  Discussed with pt the importance of adequate kcal and protein intake in wound healing. Pt and husband report that they know this information from speaking with MD and have tried multiple protein shakes and powders but pt does not seem  to like these. Pt willing to try Glucerna and ProSource Plus supplements.  Meal Completion: 50-75%  Medications reviewed and include: cholecalciferol, colace, Ensure Enlive BID, trinsicon, SSI with meals, novolog 4 units TID with meals, miralax, prednisone  Labs reviewed: potassium 5.7, BUN 35, creatinine 1.28, hemoglobin 7.4 CBG's: 146-240 x 24 hours  NUTRITION - FOCUSED PHYSICAL EXAM:  Flowsheet Row Most Recent Value  Orbital Region No depletion  Upper Arm Region Mild depletion  Thoracic and Lumbar Region No depletion  Buccal Region No depletion  Temple Region Mild depletion  Clavicle Bone Region Mild depletion  Clavicle and Acromion Bone Region Mild depletion  Scapular Bone Region Unable to assess  Dorsal Hand Mild depletion  Patellar Region Unable to assess  Anterior Thigh Region Unable to assess  Posterior Calf Region Unable to assess  Edema (RD Assessment) Mild  Hair Reviewed  Eyes Reviewed  Mouth Reviewed  Skin Reviewed  Nails Reviewed       Diet Order:   Diet Order            Diet Carb Modified Fluid consistency: Thin; Room service appropriate? Yes  Diet effective now                 EDUCATION NEEDS:   Education needs have been addressed  Skin:  Skin Assessment: Skin Integrity Issues: Incisions: right leg s/p AKA, left leg s/p AKA Other: skin tear to right arm, left arm  Last BM:  06/03/20  Height:   Ht Readings from Last  1 Encounters:  06/08/20 4\' 3"  (1.295 m)    Weight:   Wt Readings from Last 1 Encounters:  06/08/20 80.4 kg    BMI:  Body mass index is 47.91 kg/m.  Estimated Nutritional Needs:   Kcal:  1800-2000  Protein:  85-100 grams  Fluid:  1.8-2.0 L    06/10/20, MS, RD, LDN Inpatient Clinical Dietitian Please see [facility] for contact information.

## 2020-06-10 DIAGNOSIS — Z89611 Acquired absence of right leg above knee: Secondary | ICD-10-CM | POA: Diagnosis not present

## 2020-06-10 DIAGNOSIS — I739 Peripheral vascular disease, unspecified: Secondary | ICD-10-CM | POA: Diagnosis not present

## 2020-06-10 DIAGNOSIS — D62 Acute posthemorrhagic anemia: Secondary | ICD-10-CM | POA: Diagnosis not present

## 2020-06-10 DIAGNOSIS — E1169 Type 2 diabetes mellitus with other specified complication: Secondary | ICD-10-CM | POA: Diagnosis not present

## 2020-06-10 LAB — CBC
HCT: 24.6 % — ABNORMAL LOW (ref 36.0–46.0)
Hemoglobin: 7.6 g/dL — ABNORMAL LOW (ref 12.0–15.0)
MCH: 25.6 pg — ABNORMAL LOW (ref 26.0–34.0)
MCHC: 30.9 g/dL (ref 30.0–36.0)
MCV: 82.8 fL (ref 80.0–100.0)
Platelets: 314 10*3/uL (ref 150–400)
RBC: 2.97 MIL/uL — ABNORMAL LOW (ref 3.87–5.11)
RDW: 19.3 % — ABNORMAL HIGH (ref 11.5–15.5)
WBC: 12.9 10*3/uL — ABNORMAL HIGH (ref 4.0–10.5)
nRBC: 0.2 % (ref 0.0–0.2)

## 2020-06-10 LAB — GLUCOSE, CAPILLARY
Glucose-Capillary: 370 mg/dL — ABNORMAL HIGH (ref 70–99)
Glucose-Capillary: 367 mg/dL — ABNORMAL HIGH (ref 70–99)
Glucose-Capillary: 164 mg/dL — ABNORMAL HIGH (ref 70–99)
Glucose-Capillary: 131 mg/dL — ABNORMAL HIGH (ref 70–99)

## 2020-06-10 MED ORDER — SORBITOL 70 % SOLN
60.0000 mL | Status: AC
  Administered 2020-06-10: 60 mL via ORAL
  Filled 2020-06-10: qty 60

## 2020-06-10 MED ORDER — SODIUM POLYSTYRENE SULFONATE 15 GM/60ML PO SUSP
15.0000 g | Freq: Once | ORAL | Status: DC
  Filled 2020-06-10: qty 60

## 2020-06-10 MED ORDER — LISINOPRIL 20 MG PO TABS: 40.0000 mg | ORAL_TABLET | Freq: Every day | ORAL | Status: DC

## 2020-06-10 MED ORDER — OXYCODONE HCL ER 10 MG PO T12A
10.0000 mg | EXTENDED_RELEASE_TABLET | Freq: Two times a day (BID) | ORAL | Status: DC
  Administered 2020-06-10 – 2020-06-13 (×7): 10 mg via ORAL
  Filled 2020-06-10 (×6): qty 1

## 2020-06-10 MED ORDER — INSULIN GLARGINE 100 UNIT/ML ~~LOC~~ SOLN
8.0000 [IU] | Freq: Two times a day (BID) | SUBCUTANEOUS | Status: DC
  Administered 2020-06-10 – 2020-06-11 (×3): 8 [IU] via SUBCUTANEOUS
  Filled 2020-06-10 (×4): qty 0.08

## 2020-06-10 NOTE — Progress Notes (Signed)
Nurse noted scattered blisters appearing on patient's thighs and both stumps, MD made aware

## 2020-06-10 NOTE — Progress Notes (Signed)
Orthopedic Tech Progress Note Patient Details:  Kaitlin Hudson [birth year] [id number] Called in brace Patient ID: Kaitlin Hudson, female   DOB: January 30, 1956, 65 y.o.   MRN: [id number]   Kaitlin Hudson 06/10/2020, 12:39 PM

## 2020-06-10 NOTE — Patient Care Conference (Signed)
Inpatient RehabilitationTeam Conference and Plan of Care Update Date: 06/10/2020   Time: 10:07 AM    Patient Name: Kaitlin Hudson      Medical Record Number: [id number]  Date of Birth: 09-01-1955 Sex: Female         Room/Bed: 4M08C/4M08C-01 Payor Info: Payor: MEDICARE / Plan: MEDICARE PART A AND B / Product Type: *No Product type* /    Admit Date/Time:  06/08/2020  1:50 PM  Primary Diagnosis:  S/P AKA (above knee amputation) bilateral [facility])  Hospital Problems: Principal Problem:   S/P AKA (above knee amputation) bilateral [facility])    Expected Discharge Date: Expected Discharge Date:  (7-10 days)  Team Members Present: Physician leading conference: Dr. Faith Rogue Care Coodinator Present: Cecile Sheerer, LCSWA;Baraa Tubbs Marlyne Beards, RN, BSN, CRRN Nurse Present: Kennyth Arnold, RN PT Present: Serina Cowper, PT OT Present: Jake Shark, OT PPS Coordinator present : Fae Pippin, SLP     Current Status/Progress Goal Weekly Team Focus  Bowel/Bladder   Pt is continent of b/b, LBM 06/03/20(provider aware)  Pt will remain continent  q2h toileting/PRN   Swallow/Nutrition/ Hydration             ADL's   Max A overall for ADLs, max A transfers, very anxious  Min A-supervision overall  transfers, ADL retraining, d/c planning, pain management   Mobility   Max A overall  Supervision bed mobility, min A transfers, mod I power w/c mobility  Activity tolerance, pain management, strengthening/ROM, sitting balance, functional mobility, power w/c mobility, amputee education/reinforcement, patient/caregiver education   Communication             Safety/Cognition/ Behavioral Observations            Pain   Pt c/o bilateral lower ext pain. PRNs effective.  Pt will be free of pain  Assess pain Qshift   Skin   Pt has generalized bruising, mulitple skin tears on both arms/hands (dressing in place), MASD to buttocks, abrasion to R Groin.- bilateral AKA  Pt will be free from breakdown and further  infection  Assess skin qshift     Discharge Planning:  To be assessed. Per EMR, pt to have 24/7 care from husband.   Team Discussion: Pain is improving, incisions are looking good, starting insulin, starting on Lantus. Continent B/B, patient reporting severe pain, MD adding long acting pain medication, having trouble sleeping due to pain. Has the right and left AKA incisions, left arm skin tear, and right arm skin tear, MD putting in new order for right arm skin tear using zeroform instead of oil emersion dressing. Need to have height rechecked to make sure it is correct due to bilateral amputation. Education on HTN, diabetes, wound care, dressing changes, and pain management.  Patient on target to meet rehab goals: Patient is limited by anxiety and pain, she has min assist to supervision goals. Her anxiety level is very limiting on what she can do, at this point she is a max assist overall. She has supervision goals for bed mobility, min assist for transfers, and mod I goals for power WC mobility. Husband has back problems so unsure of his assistance ability.  *See Care Plan and progress notes for long and short-term goals.   Revisions to Treatment Plan:  MD adding insulin, long acting pain medication.  Teaching Needs: Family education, medication management, pain management, anxiety management, diabetes management, skin/wound care, transfer training, balance training, endurance training, safety awareness.  Current Barriers to Discharge: Decreased caregiver support, Medical stability, Home enviroment  access/layout, Wound care, Lack of/limited family support, Weight, Weight bearing restrictions, Medication compliance and Behavior  Possible Resolutions to Barriers: Continue current medications, provide emotional support.     Medical Summary Current Status: s/p bilateral AKA, ongoing wound care needs---dry dressing currently. pain poorly controlled. diabetes uncontrolled  Barriers to  Discharge: Medical stability   Possible Resolutions to Barriers/Weekly Focus: pain mgt, wound care/dressing adjustment, lantus for better dm control   Continued Need for Acute Rehabilitation Level of Care: The patient requires daily medical management by a physician with specialized training in physical medicine and rehabilitation for the following reasons: Direction of a multidisciplinary physical rehabilitation program to maximize functional independence : Yes Medical management of patient stability for increased activity during participation in an intensive rehabilitation regime.: Yes Analysis of laboratory values and/or radiology reports with any subsequent need for medication adjustment and/or medical intervention. : Yes   I attest that I was present, lead the team conference, and concur with the assessment and plan of the team.   Tennis Must 06/10/2020, 2:07 PM

## 2020-06-10 NOTE — Progress Notes (Signed)
Occupational Therapy Session Note  Patient Details  Name: Kaitlin Hudson MRN: [id number] Date of Birth: [birth year]  Today's Date: 06/10/2020 OT Individual Time: 3016-0109 OT Individual Time Calculation (min): 40 min    Short Term Goals: Week 1:  OT Short Term Goal 1 (Week 1): patient will complete rolling in bed with CS, supine to sitting with mod A OT Short Term Goal 2 (Week 1): patient will increase anterior/posterior transfer to/from commode, bed, and w/c with mod A of one OT Short Term Goal 3 (Week 1): patient will complete adl tasks with set up/min A OT Short Term Goal 4 (Week 1): patient will complete toileting commode level with max A of one  Skilled Therapeutic Interventions/Progress Updates:    Pt greeted at time of session sitting up in power chair, tired from previous sessions but agreeable to OT session. Husband Kaitlin Hudson present who remained throughout. Pt agreeable to self drive power chair as this is different than the one she is used to at home. Min A at times for turns and problem solving, pt and husband remembering basic features from previous PT session today. Pt driving room <> nurses station and set up at EOB for slide board back to bed. Slide board total A for placement and Max A going to R side, pt trying to throw herself onto bed with frequent cues for anterior weight shift but unable d/t fear of falling. Husband posteriorly as well to prevent lean. Sit > supine Mod A and pt stating she "falls" on the bed at home instead of smooth slide board transfer. Pt able to sit up in long sitting for approx 5-10 minutes for conversation unsupported sitting but not afraid of falling at this time d/t rails up. Educated on OT POC. Pt in bed resting alarm on call bell in reach.   Therapy Documentation Precautions:  Precautions Precautions: Fall Precaution Comments: dressings difficult to maintain, shrinkers to be applied. Restrictions Weight Bearing Restrictions: Yes RLE Weight Bearing:  Non weight bearing LLE Weight Bearing: Non weight bearing    Therapy/Group: Individual Therapy  Erasmo Score 06/10/2020, 7:30 AM

## 2020-06-10 NOTE — Progress Notes (Signed)
Physical Therapy Session Note  Patient Details  Name: Kaitlin Hudson MRN: [id number] Date of Birth: 07/21/1955  Today's Date: 06/10/2020 PT Individual Time: 1400-1500  And [id number] PT Individual Time Calculation (min): 60 min and 75 min  Short Term Goals: Week 1:  PT Short Term Goal 1 (Week 1): Pt will roll side to side using side rails w/ min A PT Short Term Goal 2 (Week 1): Pt will transfers sup to long sit w/ mod A PT Short Term Goal 3 (Week 1): Pt will transfer bed<> power chair w/ mod A, anterior-posterior or SB Week 2:    Week 3:     Skilled Therapeutic Interventions/Progress Updates:    AM SESSION Pain:  Pt reports 6/10 residual limb pain.  Treatment to tolerance.  Rest breaks and repositioning as needed.  Fabricated  acewrap belt to secure dressings during session.  Pt initially supine, tearful, and agreeable to treatment session w/focus on transfer training, bed mobility, fabrication of belt.  Pt rolled mult times using rails mod I for use of bedpan, hygiene, application of belt.  Nursing in to assist w/dressing change due to soiled dressings w/visible leaking esp on R.   Supine to sit w/bed features/hob elevated, min assist.   Sits w/cga for balance. Bed to wc via post scoot w/surfaces level and using sliding board and pillow to build up gap between bed and chair.    Bed to pts personal motorized GoChair w/mod assist of 1, cues for sequencing, assist for wtshifting, extended time, mult rest breaks.  Pt becomes very anxious during transfer, tearful, at times states "I am ready for Jesus to take me".  Emotional support provided.   Once in chiar, pt noted w/poor limb support, widely abducated limbs. Safety belt fastened and pt operated wc >175ft in hallways mod I.  wc to bed using sliding board to R w/mod assist, cues for head/hips relationship, pt grabs for bedrail, became painicky and thrust self into bed despite no actual safety issues or risk of fall during transfer.  Pt  exhibited similar anxiety during last rehab stay but was able to overcome as confidience gained w/her mobility.      PM SESSION Pain:  Pt reports 4/10 pain. States she received pain meds prior to session.  Treatment to tolerance.  Rest breaks and repositioning as needed.  Pt initially supine and agreeable to treatment session w/focus on seating/positioning/functional mobility. Therapist obtained PWC to facilitate improved pressure relief, positioning, and tolerance for upright.  Pt and husband in room and educated re: purpose of use of PWC during IPR and benefits for pt vs her personal GoChair which did not provide adequate support.  Pt/husband agreeable to trying this.  Pt rolled mult times mod I for therapist to apply acewrap belt for securing AKA dresssing.  Pt supine to sit w/hob elevated and min assist.  stand pivot transfer bed to wc max assist of 1, husband assists w/board placement.  Pt w/much improved postural support, limb support, pressure distribution. Pt/husband instructed w/basic controls including switching between positioning and driving modes and operation to tilt feature.  Pt practiced using controls to change fujnctions, to tilt wc, to power on/off, and to drive at slowest setting x 150ft w/min assist, cues for steering.  Reviewed wc controls and had pt and  husband operate controls for tilting wc in event pt became uncomfortable while OOB x 1 hr.  OT planning to assist w/transfer training wc to bed at that time. Pt left oob in wc  w/alarm belt set and needs in reach, NT in room.   Therapy Documentation Precautions:  Precautions Precautions: Fall Precaution Comments: dressings difficult to maintain, shrinkers to be applied. Restrictions Weight Bearing Restrictions: Yes RLE Weight Bearing: Non weight bearing LLE Weight Bearing: Non weight bearing    Therapy/Group: Individual Therapy  Rada Hay, PT   [name] 06/10/2020, 4:07 PM

## 2020-06-10 NOTE — Progress Notes (Addendum)
PROGRESS NOTE   Subjective/Complaints: Pain better but still not well controlled. Sugars elevated. Tolerating dressings to legs but having a hard time keeping them up.   ROS: Patient denies fever, rash, sore throat, blurred vision, nausea, vomiting, diarrhea, cough, shortness of breath or chest pain,  Headache .     Objective:   No results found. Recent Labs    06/09/20 0538 06/10/20 0522  WBC 14.6* 12.9*  HGB 7.4* 7.6*  HCT 23.4* 24.6*  PLT 283 314   Recent Labs    06/09/20 0538  NA 137  K 5.7*  CL 112*  CO2 17*  GLUCOSE 235*  BUN 35*  CREATININE 1.28*  CALCIUM 8.8*    Intake/Output Summary (Last 24 hours) at 06/10/2020 1131 Last data filed at 06/09/2020 1400 Gross per 24 hour  Intake 120 ml  Output --  Net 120 ml        Physical Exam: Vital Signs Blood pressure 140/61, pulse (!) 103, temperature 98 F (36.7 C), resp. rate 17, height 4\' 3"  (1.295 m), weight 80.4 kg, SpO2 99 %.  Constitutional: No distress . Vital signs reviewed. obese HEENT: EOMI, oral membranes moist Neck: supple Cardiovascular: RRR without murmur. No JVD    Respiratory/Chest: CTA Bilaterally without wheezes or rales. Normal effort    GI/Abdomen: BS +, non-tender, non-distended Ext: no clubbing, cyanosis, or edema Psych: flat, appears depressed.  Skin: wounds intact with sutures, serosang discharge bilaterally into dressing. Oil emersion dressing stuck to laceration right forearm.  Neuro: Pt is cognitively appropriate with normal insight, memory, and awareness. Cranial nerves 2-12 are intact. Sensory exam is normal. Reflexes are 2+ in all 4's. Fine motor coordination is intact. No tremors. Motor function is grossly 5/5 UE and 3- to 3/5 LE R>L.  Musculoskeletal: both legs edematous, and remain very tender to touch.      Assessment/Plan: 1. Functional deficits which require 3+ hours per day of interdisciplinary therapy in a  comprehensive inpatient rehab setting.  Physiatrist is providing close team supervision and 24 hour management of active medical problems listed below.  Physiatrist and rehab team continue to assess barriers to discharge/monitor patient progress toward functional and medical goals  Care Tool:  Bathing    Body parts bathed by patient: Right arm,Left arm,Chest,Abdomen,Front perineal area,Right upper leg,Left upper leg,Face   Body parts bathed by helper: Buttocks,Right upper leg,Left upper leg Body parts n/a: Right lower leg,Left lower leg   Bathing assist Assist Level: Moderate Assistance - Patient 50 - 74%     Upper Body Dressing/Undressing Upper body dressing   What is the patient wearing?: Pull over shirt    Upper body assist Assist Level: Minimal Assistance - Patient > 75%    Lower Body Dressing/Undressing Lower body dressing      What is the patient wearing?: Pants,Incontinence brief     Lower body assist Assist for lower body dressing: Maximal Assistance - Patient 25 - 49%     Toileting Toileting    Toileting assist Assist for toileting: Total Assistance - Patient < 25%     Transfers Chair/bed transfer  Transfers assist     Chair/bed transfer assist level: Maximal Assistance - Patient  25 - 49%     Locomotion Ambulation   Ambulation assist   Ambulation activity did not occur: N/A          Walk 10 feet activity   Assist           Walk 50 feet activity   Assist           Walk 150 feet activity   Assist           Walk 10 feet on uneven surface  activity   Assist Walk 10 feet on uneven surfaces activity did not occur: N/A         Wheelchair     Assist Will patient use wheelchair at discharge?: Yes Type of Wheelchair: Power    Wheelchair assist level: Supervision/Verbal cueing Max wheelchair distance: 300    Wheelchair 50 feet with 2 turns activity    Assist        Assist Level: Supervision/Verbal  cueing   Wheelchair 150 feet activity     Assist      Assist Level: Supervision/Verbal cueing   Blood pressure 140/61, pulse (!) 103, temperature 98 F (36.7 C), resp. rate 17, height 4\' 3"  (1.295 m), weight 80.4 kg, SpO2 99 %.  Medical Problem List and Plan: 1.  Deficits with mobility, transfers, endurance, self-care secondary to wound dehiscence status post bilateral AKA.             Patient may shower with incisions covered             Goals/ELOS: Min/mod a at wheelchair level/7-10 days            Team conference today. LOS will be determined by pain, mood to a great extent 2.  DVT Prophylaxis/Anticoagulation, hx of DVT: Pharmaceutical:   -wounds still draining a decent amount  -hgb stable fortunately  -f/u cbc tomorrow. If stable will resume eliquis 5mg  bid         Antiplatelets: Clopidogrel 75, ASA 81 3. Pain Management: Oxycodone as needed             Robaxin 500 qid scheduled             Cymbalta 60 daily             3/29 pain still poorly controlled   -add oxycontin 10mg  q12 4. Mood/anxiety/depression: Team to provide ongoing egosupport             Cymbalta 60 mg daily              -neuropsych input would be helpful  -improve paincontrol 5. Neuropsych: This patient is capable of making decisions on her own behalf. 6. Skin/Wound Care: dry dressings ordered for residual limbs  -will ask [employer] to bring stump socks 7. Fluids/Electrolytes/Nutrition: Routine I/OS             labs reviewed. Mild elevation of AST--likely reactive 8.  Vitamin D deficiency             Vitamin D 1000 units daily 9.  Slow transit constipation---intake starting to pick up             colace, miralax  -try sorbitol today  -pt doesn't want anything PR               10.  Type 1 diabetes mellitus type II with hyperglycemia             SSI  Labile at present             Patient has insulin pump. This was not resumed. She sent it home  -will start lantus 8u bid while here and  titrate as needed 11.  PVD             See #2  12.  Essential hypertension             Lopressor 12.5 twice daily, Bumex 2 mg daily  -controlled 3/29 13.  Hyperlipidemia             Zestril 40 mg daily 14.  Acute on chronic renal failure:  -BUN improved today--continue to push fluids  -K+ elevated, hold ACE for now  -recheck tomorrow  15.  CAD             See #2 16.  RA             Prednisone 12.5 mg daily, 2.5 mg nightly 17.  Leukocytosis             Afebrile             wbc's falling--14k today, steroid component 18.  Acute blood loss anemia             Hemoglobin 7.6 on 3/26---7.4 3/28-->7.6 3/29             serial cbc's    LOS: 2 days A FACE TO FACE EVALUATION WAS PERFORMED  Ranelle Oyster 06/10/2020, 11:31 AM

## 2020-06-11 LAB — CBC
HCT: 23 % — ABNORMAL LOW (ref 36.0–46.0)
Hemoglobin: 7.3 g/dL — ABNORMAL LOW (ref 12.0–15.0)
MCH: 25.7 pg — ABNORMAL LOW (ref 26.0–34.0)
MCHC: 31.7 g/dL (ref 30.0–36.0)
MCV: 81 fL (ref 80.0–100.0)
Platelets: 319 10*3/uL (ref 150–400)
RBC: 2.84 MIL/uL — ABNORMAL LOW (ref 3.87–5.11)
RDW: 19 % — ABNORMAL HIGH (ref 11.5–15.5)
WBC: 11.8 10*3/uL — ABNORMAL HIGH (ref 4.0–10.5)
nRBC: 0.3 % — ABNORMAL HIGH (ref 0.0–0.2)

## 2020-06-11 LAB — GLUCOSE, CAPILLARY
Glucose-Capillary: 232 mg/dL — ABNORMAL HIGH (ref 70–99)
Glucose-Capillary: 219 mg/dL — ABNORMAL HIGH (ref 70–99)
Glucose-Capillary: 236 mg/dL — ABNORMAL HIGH (ref 70–99)
Glucose-Capillary: 215 mg/dL — ABNORMAL HIGH (ref 70–99)

## 2020-06-11 LAB — BASIC METABOLIC PANEL
Anion gap: 8 (ref 5–15)
BUN: 33 mg/dL — ABNORMAL HIGH (ref 8–23)
CO2: 19 mmol/L — ABNORMAL LOW (ref 22–32)
Calcium: 8.8 mg/dL — ABNORMAL LOW (ref 8.9–10.3)
Chloride: 111 mmol/L (ref 98–111)
Creatinine, Ser: 1.15 mg/dL — ABNORMAL HIGH (ref 0.44–1.00)
GFR, Estimated: 53 mL/min — ABNORMAL LOW (ref >60)
Glucose, Bld: 236 mg/dL — ABNORMAL HIGH (ref 70–99)
Potassium: 5 mmol/L (ref 3.5–5.1)
Sodium: 138 mmol/L (ref 135–145)

## 2020-06-11 MED ORDER — INSULIN GLARGINE 100 UNIT/ML ~~LOC~~ SOLN
10.0000 [IU] | Freq: Two times a day (BID) | SUBCUTANEOUS | Status: DC
  Administered 2020-06-11 – 2020-06-12 (×2): 10 [IU] via SUBCUTANEOUS
  Filled 2020-06-11 (×3): qty 0.1

## 2020-06-11 MED ORDER — LISINOPRIL 20 MG PO TABS: 40.0000 mg | ORAL_TABLET | Freq: Every day | ORAL | Status: DC

## 2020-06-11 MED ORDER — APIXABAN 5 MG PO TABS
5.0000 mg | ORAL_TABLET | Freq: Two times a day (BID) | ORAL | Status: DC
  Administered 2020-06-11 – 2020-06-12 (×2): 5 mg via ORAL
  Filled 2020-06-11 (×3): qty 1

## 2020-06-11 NOTE — Plan of Care (Signed)
  Problem: Consults Goal: RH LIMB LOSS PATIENT EDUCATION Description: Description: See Patient Education module for eduction specifics. Outcome: Progressing Goal: Skin Care Protocol Initiated - if Braden Score 18 or less Description: If consults are not indicated, leave blank or document N/A Outcome: Progressing Goal: Diabetes Guidelines if Diabetic/Glucose > 140 Description: If diabetic or lab glucose is > 140 mg/dl - Initiate Diabetes/Hyperglycemia Guidelines & Document Interventions  Outcome: Progressing   Problem: RH SKIN INTEGRITY Goal: RH STG SKIN FREE OF INFECTION/BREAKDOWN Description: Skin to remain free from infection and breakdown with min assist. Outcome: Progressing Goal: RH STG MAINTAIN SKIN INTEGRITY WITH ASSISTANCE Description: STG Maintain Skin Integrity With min Assistance. Outcome: Progressing Goal: RH STG ABLE TO PERFORM INCISION/WOUND CARE W/ASSISTANCE Description: STG Able To Perform Incision/Wound Care With min Assistance. Outcome: Progressing   Problem: RH SAFETY Goal: RH STG ADHERE TO SAFETY PRECAUTIONS W/ASSISTANCE/DEVICE Description: STG Adhere to Safety Precautions With min Assistance/Device. Outcome: Progressing Goal: RH STG DECREASED RISK OF FALL WITH ASSISTANCE Description: STG Decreased Risk of Fall With min Assistance. Outcome: Progressing   Problem: RH PAIN MANAGEMENT Goal: RH STG PAIN MANAGED AT OR BELOW PT'S PAIN GOAL Description: <4 on a 0-10 pain scale. Outcome: Progressing   Problem: RH KNOWLEDGE DEFICIT LIMB LOSS Goal: RH STG INCREASE KNOWLEDGE OF SELF CARE AFTER LIMB LOSS Description: Patient will demonstrate knowledge of medication management, skin/wound care management, residual limb care, diabetes management with educational materials and handouts provided by staff. Outcome: Progressing

## 2020-06-11 NOTE — Progress Notes (Signed)
Occupational Therapy Session Note  Patient Details  Name: Kaitlin Hudson MRN: [id number] Date of Birth: 12-08-1955  Today's Date: 06/11/2020 OT Individual Time: 1400-1445 OT Individual Time Calculation (min): 45 min    Short Term Goals: Week 1:  OT Short Term Goal 1 (Week 1): patient will complete rolling in bed with CS, supine to sitting with mod A OT Short Term Goal 2 (Week 1): patient will increase anterior/posterior transfer to/from commode, bed, and w/c with mod A of one OT Short Term Goal 3 (Week 1): patient will complete adl tasks with set up/min A OT Short Term Goal 4 (Week 1): patient will complete toileting commode level with max A of one  Skilled Therapeutic Interventions/Progress Updates:    1:1. Pt received in PWC with head resting on high low table. Pt and OT familiar from previous admission and pt teary recounting happenings to get pt back to rehab with revision of AKAs. Pt and OT review pressure breaks in PWC and OT uses teach back method after OT demo pressure breaks d/t skin breakdown on bottom. Pt declines use of time in room for recall of pressure breaks. Pt initially fearful of tilt feature but able to take deep breaths and become comfortable in tilted feature. Pt completes SB transfer back to bed with OT facilitating forward weight shift and MIN A for [place] transfer from w/c. Pt with increased pain initially after transfer but subsides with improvements in positioning. Elbow pads provided. Exited session with pt seated in bed, exit alarm on and call light in reach   Therapy Documentation Precautions:  Precautions Precautions: Fall Precaution Comments: dressings difficult to maintain, shrinkers to be applied. Restrictions Weight Bearing Restrictions: Yes RLE Weight Bearing: Non weight bearing LLE Weight Bearing: Non weight bearing General:   Vital Signs: Therapy Vitals Temp: 98.3 F (36.8 C) Pulse Rate: 100 Resp: 17 BP: (!) 126/55 Patient Position (if  appropriate): Lying Oxygen Therapy SpO2: 99 % O2 Device: Room Air Pain:   ADL: ADL Eating: Set up Where Assessed-Eating: Bed level Grooming: Setup Where Assessed-Grooming: Bed level Upper Body Bathing: Setup Where Assessed-Upper Body Bathing: Bed level Lower Body Bathing: Maximal assistance Where Assessed-Lower Body Bathing: Bed level Upper Body Dressing: Minimal assistance Where Assessed-Upper Body Dressing: Bed level Lower Body Dressing: Maximal assistance Where Assessed-Lower Body Dressing: Bed level Toileting: Maximal assistance Where Assessed-Toileting: Bed level ADL Comments: ADL bed level this session Vision   Perception    Praxis   Exercises:   Other Treatments:     Therapy/Group: Individual Therapy  Shon Hale 06/11/2020, 7:43 AM

## 2020-06-11 NOTE — Progress Notes (Signed)
Pt LLE incision had moderate amount of drainage/blood and purple and boggy around the incision site. Incision dressing was changed. This nurse also noted several blisters on the pts thighs and around the incisions on bilateral lower extremities. Provider was made aware 06/10/20

## 2020-06-11 NOTE — Progress Notes (Signed)
Pt notified this nurse about missing personal cell phone. Pt stated she last had it earlier before the linen was changed on her bed. Charge RN Shanda Bumps made aware.

## 2020-06-11 NOTE — Progress Notes (Signed)
Patient ID: Kaitlin Hudson, female   DOB: 07/30/1955, 64 y.o.   MRN: [id number]  SW met with pt and pt husband in room to provide updates from team conference, and ELOS 7-10 days. Pt very tearful during visit as she reported she was in a lot of pain, and just unable to eat because of the pain. Pt informed on new medication to be added for pain today per attending. SW to follow-up once there is more information on d/c date.  Auria Chamberlain, MSW, LCSWA Office: [phone] Cell: [phone] Fax: [phone] 

## 2020-06-11 NOTE — Progress Notes (Signed)
Physical Therapy Session Note  Patient Details  Name: Kaitlin Hudson MRN: [id number] Date of Birth: March 04, 1956  Today's Date: 06/11/2020 PT Individual Time: 0800-0915 PT Individual Time Calculation (min): 75 min   Short Term Goals: Week 1:  PT Short Term Goal 1 (Week 1): Pt will roll side to side using side rails w/ min A PT Short Term Goal 2 (Week 1): Pt will transfers sup to long sit w/ mod A PT Short Term Goal 3 (Week 1): Pt will transfer bed<> power chair w/ mod A, anterior-posterior or SB  Skilled Therapeutic Interventions/Progress Updates:     Patient in bed with her husband in the room upon PT arrival. Patient alert and agreeable to PT session. Patient reported 10/10 L residual limb pain during session, RN made aware. PT provided repositioning, rest breaks, and distraction as pain interventions throughout session.   Focused session on pain management, wound care, stump sock management, and bed level toileting and positioning for pressure relief. Provided thorough education on these topics. Also discussed d/c planning and previous mobility. Educated on benefits of power mobility in a power w/c versus patient's power scooter for improved seated positioning, support in sitting due to decreased sitting balance/trunk control, and opportunities for independent pressure relief. Patient and her husband open to trials with power w/c during therapies.   Removed L limb dressing that was saturated in serous drainage through the dressing and onto the sheets. Also noted significant increased edema in L residual limb compared to the R . RN made aware and approved PT to reapply dressings to B lower extremities per MD orders. Cleaned around her incisions with sterile saline wipes and dried the skin. To B residual limbs, applied oil emersion dressing, 2 4x4s, and Kerlix, 2 on L for drainage management. Then applied B stump socks with total A, educating patient and her husband on technique and purpose.    Therapeutic Activity: Bed Mobility: Patient performed rolling R and L x5 with min-mod A with use of bed rail. PT placed and removed bed pan with total A for toileting. Patient was continent of bowl and bladder on bed pan. Provided verbal cues for brining opposite limb to chest for improved rolling.  Therapeutic Exercise: Patient performed the following exercises with verbal and tactile cues for proper technique. -B hip flexion x6 in supine with prolonged hold during dressing changes -B isometric hip adduction in supine 2x5 with 5 sec holds  Patient in bed with her husband in the room at end of session with breaks locked, bed alarm set, and all needs within reach.    Therapy Documentation Precautions:  Precautions Precautions: Fall Precaution Comments: dressings difficult to maintain, shrinkers to be applied. Restrictions Weight Bearing Restrictions: Yes RLE Weight Bearing: Non weight bearing LLE Weight Bearing: Non weight bearing   Therapy/Group: Individual Therapy  Dhairya Corales L Amoy Steeves PT, DPT  06/11/2020, 12:22 PM

## 2020-06-11 NOTE — Progress Notes (Signed)
Patient started on Eliquis 5mg  and stated that she refuses medication and will not take it. Patient was educated and proceeded to state that her Vascular MD took her off the mediation before she was admitted to IP Rehab on 05/28/2020. PA Dan notified.

## 2020-06-11 NOTE — Progress Notes (Addendum)
PROGRESS NOTE   Subjective/Complaints: Had a better night. Moved bowels. Pain control improved. Intake better but sometimes has been too sleepy to eat.  ROS: Patient denies fever, rash, sore throat, blurred vision, nausea, vomiting, diarrhea, cough, shortness of breath or chest pain,   headache, or mood change.    Objective:   No results found. Recent Labs    06/10/20 0522 06/11/20 0520  WBC 12.9* 11.8*  HGB 7.6* 7.3*  HCT 24.6* 23.0*  PLT 314 319   Recent Labs    06/09/20 0538 06/11/20 0520  NA 137 138  K 5.7* 5.0  CL 112* 111  CO2 17* 19*  GLUCOSE 235* 236*  BUN 35* 33*  CREATININE 1.28* 1.15*  CALCIUM 8.8* 8.8*   No intake or output data in the 24 hours ending 06/11/20 0258      Physical Exam: Vital Signs Blood pressure (!) 126/55, pulse 100, temperature 98.3 F (36.8 C), resp. rate 17, height 3' 6.91" (1.09 m), weight 80.4 kg, SpO2 99 %.  Constitutional: No distress . Vital signs reviewed. HEENT: EOMI, oral membranes moist Neck: supple Cardiovascular: RRR without murmur. No JVD    Respiratory/Chest: CTA Bilaterally without wheezes or rales. Normal effort    GI/Abdomen: BS +, non-tender, non-distended Ext: no clubbing, cyanosis, or edema Psych: sl flat but more engaging  Skin: wounds intact with sutures,serosang discharge bilaterally into dressing. Buttock wounds not seen today  Neuro: Pt is cognitively appropriate with normal insight, memory, and awareness. Cranial nerves 2-12 are intact. Sensory exam is normal. Reflexes are 2+ in all 4's. Fine motor coordination is intact. No tremors. Motor function is grossly 5/5 UE and 3- to 3/5 LE R>L. --stable appearance Musculoskeletal: both legs edematous, and are tender to touch.      Assessment/Plan: 1. Functional deficits which require 3+ hours per day of interdisciplinary therapy in a comprehensive inpatient rehab setting.  Physiatrist is providing  close team supervision and 24 hour management of active medical problems listed below.  Physiatrist and rehab team continue to assess barriers to discharge/monitor patient progress toward functional and medical goals  Care Tool:  Bathing    Body parts bathed by patient: Right arm,Left arm,Chest,Abdomen,Front perineal area,Right upper leg,Left upper leg,Face   Body parts bathed by helper: Buttocks,Right upper leg,Left upper leg Body parts n/a: Right lower leg,Left lower leg   Bathing assist Assist Level: Moderate Assistance - Patient 50 - 74%     Upper Body Dressing/Undressing Upper body dressing   What is the patient wearing?: Pull over shirt    Upper body assist Assist Level: Minimal Assistance - Patient > 75%    Lower Body Dressing/Undressing Lower body dressing      What is the patient wearing?: Pants,Incontinence brief     Lower body assist Assist for lower body dressing: Maximal Assistance - Patient 25 - 49%     Toileting Toileting    Toileting assist Assist for toileting: Total Assistance - Patient < 25%     Transfers Chair/bed transfer  Transfers assist     Chair/bed transfer assist level: Maximal Assistance - Patient 25 - 49% (slide board and chuck pad)     Locomotion Ambulation  Ambulation assist   Ambulation activity did not occur: N/A          Walk 10 feet activity   Assist           Walk 50 feet activity   Assist           Walk 150 feet activity   Assist           Walk 10 feet on uneven surface  activity   Assist Walk 10 feet on uneven surfaces activity did not occur: N/A         Wheelchair     Assist Will patient use wheelchair at discharge?: Yes Type of Wheelchair: Power    Wheelchair assist level: Supervision/Verbal cueing Max wheelchair distance: 300    Wheelchair 50 feet with 2 turns activity    Assist        Assist Level: Supervision/Verbal cueing   Wheelchair 150 feet activity      Assist      Assist Level: Supervision/Verbal cueing   Blood pressure (!) 126/55, pulse 100, temperature 98.3 F (36.8 C), resp. rate 17, height 3' 6.91" (1.09 m), weight 80.4 kg, SpO2 99 %.  Medical Problem List and Plan: 1.  Deficits with mobility, transfers, endurance, self-care secondary to wound dehiscence status post bilateral AKA.             Patient may shower with incisions covered             Goals/ELOS: Min/mod a at wheelchair level/7-10 days            will revisit LOS on Friday 2.  DVT Prophylaxis/Anticoagulation, hx of DVT: Pharmaceutical:   -wounds still draining   -hgb stable around 7.3 3/30    -resume eliquis 5mg  bid   -dc lovenox         Antiplatelets: Clopidogrel 75, ASA 81 3. Pain Management: Oxycodone as needed             Robaxin 500 qid scheduled             Cymbalta 60 daily             3/29  added oxycontin 10mg  q12---monitor for tolerance 4. Mood/anxiety/depression: Team to provide ongoing egosupport             Cymbalta 60 mg daily              -neuropsych input would be helpful  -improve paincontrol 5. Neuropsych: This patient is capable of making decisions on her own behalf. 6. Skin/Wound Care: dry dressings ordered for residual limbs  -  stump socks per [facility] 7. Fluids/Electrolytes/Nutrition: Routine I/OS             labs reviewed. Mild elevation of AST--likely reactive 8.  Vitamin D deficiency             Vitamin D 1000 units daily 9.  Slow transit constipation---intake starting to pick up             colace, miralax  -results with sorbitol  -push po               10.  Type 1 diabetes mellitus type II with hyperglycemia             SSI             Labile at present             Patient has insulin pump. This was not resumed. She sent  it home  -3/30 improving control. increase lantus to 10u bid while   11.  PVD             See #2  12.  Essential hypertension             Lopressor 12.5 twice daily, Bumex 2 mg daily  -controlled  3/29 13.  Hyperlipidemia             Zestril 40 mg daily 14.  Acute on chronic renal failure:  -BUN improved today--continue to push fluids  -K+ elevated, holding  ACE for now  -recheck Friday 15.  CAD             See #2 16.  RA             Prednisone 12.5 mg daily, 2.5 mg nightly 17.  Leukocytosis             Afebrile             wbc's falling--11.8, steroid component 18.  Acute blood loss anemia             Hemoglobin 7.6 on 3/26---7.4 3/28-->7.6 3/29-->7.3 3/30             recheck friday    LOS: 3 days A FACE TO FACE EVALUATION WAS PERFORMED  Ranelle Oyster 06/11/2020, 8:32 AM

## 2020-06-11 NOTE — Progress Notes (Addendum)
Inpatient Rehabilitation Care Coordinator Assessment and Plan Patient Details  Name: Kaitlin Hudson MRN: [id number] Date of Birth: [birth year]  Today's Date: 06/11/2020  Hospital Problems: Principal Problem:   S/P AKA (above knee amputation) bilateral [facility])  Past Medical History:  Past Medical History:  Diagnosis Date  . Anxiety   . Arthritis, rheumatoid (HCC)   . Coronary artery disease   . Depression   . Diabetes mellitus without complication (HCC)   . DVT (deep venous thrombosis) (HCC)    Left leg 10/2019  . Headache   . History of blood transfusion   . Hyperlipidemia   . Hypertension   . Latent tuberculosis    pt denies this dx  . Peripheral vascular disease (HCC)    blood clot in left leg  . Type 1 diabetes (HCC)    Past Surgical History:  Past Surgical History:  Procedure Laterality Date  . AMPUTATION Bilateral 12/19/2019   Procedure: BILATERAL BELOW KNEE AMPUTATION;  Surgeon: Nadara Mustard, MD;  Location[facility] OR;  Service: Orthopedics;  Laterality: Bilateral;  . AMPUTATION Bilateral 06/04/2020   Procedure: BILATERAL ABOVE KNEE AMPUTATION;  Surgeon: Nadara Mustard, MD;  Location: [facility] OR;  Service: Orthopedics;  Laterality: Bilateral;  . APPLICATION OF WOUND VAC Bilateral 06/04/2020   Procedure: APPLICATION OF WOUND VAC;  Surgeon: Nadara Mustard, MD;  Location: [facility] OR;  Service: Orthopedics;  Laterality: Bilateral;  . CARDIAC CATHETERIZATION  11/02/2019  . CARPAL TUNNEL RELEASE Bilateral   . EYE SURGERY Bilateral    09/2018  . FINGER SURGERY  07/11/2014   right middle finger - nodule removed  . FINGER SURGERY  02/20/2017   right middle finger  . FOOT SURGERY Right 02/26/2014   screw  . I & D EXTREMITY Right 08/15/2019   Procedure: RIGHT ACHILLES DEBRIDEMENT, POSSIBLE SKIN GRAFT;  Surgeon: Nadara Mustard, MD;  Location: [facility] OR;  Service: Orthopedics;  Laterality: Right;  . JOINT REPLACEMENT    . SHOULDER SURGERY Right 01/06/2005  . TRIGGER FINGER RELEASE  12/202002   left  hand -   . WRIST SURGERY Left 10/23/2004   and middle finger  . WRIST SURGERY Right 11/05/2005  . WRIST SURGERY Right 03/11/2006   cyst removed   Social History:  reports that she has never smoked. She has never used smokeless tobacco. She reports that she does not drink alcohol and does not use drugs.  Family / Support Systems Marital Status: Married How Long?: 48 years Patient Roles: Spouse Spouse/Significant Other: Kaitlin Hudson (husband) Other Supports: N/A Anticipated Caregiver: Wayne Ability/Limitations of Caregiver: None reported Caregiver Availability: 24/7 Family Dynamics: pt lives with her husband.  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 Guardian/Conservator: N/A   Abuse/Neglect Abuse/Neglect Assessment Can Be Completed: Yes Physical Abuse: Denies Verbal Abuse: Denies Sexual Abuse: Denies Exploitation of patient/patient's resources: Denies Self-Neglect: Denies  Emotional Status Pt's affect, behavior and adjustment status: Pt very tearful and in pain at time of visit Recent Psychosocial Issues: Hx of anxiety Psychiatric History: None reported Substance Abuse History: Denies  Patient / Family Perceptions, Expectations & Goals Pt/Family understanding of illness & functional limitations: pt and pt husband have a general understanding of car eneeds Premorbid pt/family roles/activities: some assistance with ADLs; otherwise independent Anticipated changes in roles/activities/participation: No changes  Building surveyor: None Premorbid Home Care/DME Agencies: None Transportation available at discharge: Husband Resource referrals recommended: Neuropsychology  Discharge Planning  Living Arrangements: Spouse/significant other Support Systems: Spouse/significant other Type of Residence: Private  residence Insurance Resources: [employer] Financial Resources: Social Security Financial Screen Referred: No Living Expenses: Own Money Management: Spouse,Patient Does the patient have any problems obtaining your medications?: No Care Coordinator Barriers to Discharge: Decreased caregiver support,Lack of/limited family support Care Coordinator Anticipated Follow Up Needs: HH/OP  Clinical Impression SW familiar to pt from previous admission. No new changes to home situation.Husband will remain primary caregiver. Pt not a veteran. DME: Rolling Walker, Wheelchair, 3n1 Commode, TTB,  Power wheelchair (here in pt room), and ramped entrance.   Kaitlin Hudson A [name] 06/11/2020, 10:15 AM

## 2020-06-11 NOTE — Progress Notes (Signed)
Occupational Therapy Session Note  Patient Details  Name: Kaitlin Hudson MRN: [id number] Date of Birth: 05/05/1955  Today's Date: 06/11/2020 OT Individual Time: 7564-3329 OT Individual Time Calculation (min): 68 min    Short Term Goals: Week 1:  OT Short Term Goal 1 (Week 1): patient will complete rolling in bed with CS, supine to sitting with mod A OT Short Term Goal 2 (Week 1): patient will increase anterior/posterior transfer to/from commode, bed, and w/c with mod A of one OT Short Term Goal 3 (Week 1): patient will complete adl tasks with set up/min A OT Short Term Goal 4 (Week 1): patient will complete toileting commode level with max A of one  Skilled Therapeutic Interventions/Progress Updates:    Pt supine with no c/o pain at rest. Extra time spent building rapport with pt and husband- both familiar to OT from previous CIR stay in 2021. Reviewed PLOF, home set up, and DME already at home. Discussed different between [facility] and scooter/power chair they already have. Pt with high pain spikes during all mobility, also related to anxiety, rest and emotional support/guided breathing provided. Pt completed rolling R and L with min A in the bed using bed rails for shrinker belt to be donned. Pants donned with max A bed level. Pt transferred into sitting with use of B bed rails and min A. She required UE support to remain sitting and felt least anxious with bed rail vs on the bed for support. She required max A to scoot to EOB with use of bed pad for assist. Shirt donned with mod A. Slideboard was positioned and pt performed lateral scoot with max A provided by husband. Edu on need for him to reduce physical assist for his own back (hx of bulging disc) and to allow pt to increase strength with transfers. Pt completed oral care at the sink with set up assist. Remainder of session focused on power w/c mobility/navigation. Edu provided on w/c features and pressure relief mechanisms. Pt completed 4x weaving  in/out of cones in the hallway with min cueing and pt ~75% accurate. Changed out goal post control to joystick to increase pt familiarity/comfort as her prior w/c had joystick control. Pt returned to her room and was left sitting up with all needs met, husband present. W/c seatbelt on.   Therapy Documentation Precautions:  Precautions Precautions: Fall Precaution Comments: dressings difficult to maintain, shrinkers to be applied. Restrictions Weight Bearing Restrictions: Yes RLE Weight Bearing: Non weight bearing LLE Weight Bearing: Non weight bearing   Therapy/Group: Individual Therapy  Curtis Sites 06/11/2020, 6:23 AM

## 2020-06-11 NOTE — Progress Notes (Signed)
Pt has new skin breakdown noted on her buttocks. 2 sm open areas, 1/2 cm in diameter. Foam dressing applied, pt encouraged to switch positions to take pressure off of buttocks. Provider will be made aware.

## 2020-06-12 LAB — CBC
HCT: 24.5 % — ABNORMAL LOW (ref 36.0–46.0)
Hemoglobin: 7.5 g/dL — ABNORMAL LOW (ref 12.0–15.0)
MCH: 25.9 pg — ABNORMAL LOW (ref 26.0–34.0)
MCHC: 30.6 g/dL (ref 30.0–36.0)
MCV: 84.5 fL (ref 80.0–100.0)
Platelets: 348 10*3/uL (ref 150–400)
RBC: 2.9 MIL/uL — ABNORMAL LOW (ref 3.87–5.11)
RDW: 19 % — ABNORMAL HIGH (ref 11.5–15.5)
WBC: 10.9 10*3/uL — ABNORMAL HIGH (ref 4.0–10.5)
nRBC: 0.3 % — ABNORMAL HIGH (ref 0.0–0.2)

## 2020-06-12 LAB — GLUCOSE, CAPILLARY
Glucose-Capillary: 243 mg/dL — ABNORMAL HIGH (ref 70–99)
Glucose-Capillary: 319 mg/dL — ABNORMAL HIGH (ref 70–99)
Glucose-Capillary: 174 mg/dL — ABNORMAL HIGH (ref 70–99)
Glucose-Capillary: 182 mg/dL — ABNORMAL HIGH (ref 70–99)
Glucose-Capillary: 170 mg/dL — ABNORMAL HIGH (ref 70–99)

## 2020-06-12 MED ORDER — INSULIN GLARGINE 100 UNIT/ML ~~LOC~~ SOLN
12.0000 [IU] | Freq: Two times a day (BID) | SUBCUTANEOUS | Status: DC
  Administered 2020-06-12 – 2020-06-13 (×2): 12 [IU] via SUBCUTANEOUS
  Filled 2020-06-12 (×3): qty 0.12

## 2020-06-12 MED ORDER — GLUCERNA SHAKE PO LIQD
237.0000 mL | ORAL | Status: DC
Start: 2020-06-12 — End: 2020-06-19
  Administered 2020-06-12 – 2020-06-18 (×5): 237 mL via ORAL
  Filled 2020-06-12: qty 237

## 2020-06-12 MED ORDER — ENOXAPARIN SODIUM 30 MG/0.3ML ~~LOC~~ SOLN
30.0000 mg | SUBCUTANEOUS | Status: DC
  Administered 2020-06-12 – 2020-06-16 (×5): 30 mg via SUBCUTANEOUS
  Filled 2020-06-12 (×5): qty 0.3

## 2020-06-12 NOTE — Progress Notes (Signed)
Nutrition Follow-up  DOCUMENTATION CODES:   Not applicable  INTERVENTION:   - Glucerna Shake po daily, each supplement provides 220 kcal and 10 grams of protein  - ProSource Plus 30 ml po daily, each supplement provides 100 kcal and 15 grams of protein  - Continue daily MVI  - Family to bring in [employer] protein shakes from home, each shake provides 240 kcal and 26 grams of protein  NUTRITION DIAGNOSIS:   Increased nutrient needs related to wound healing as evidenced by estimated needs.  Ongoing  GOAL:   Patient will meet greater than or equal to 90% of their needs  Progressing  MONITOR:   PO intake,Supplement acceptance,Labs,Weight trends,Skin  REASON FOR ASSESSMENT:   Malnutrition Screening Tool    ASSESSMENT:   65 year old female with PMH of T1DM with insulin pump, PVD, HTN, HLD, DVT, depression, CAD, RA, anxiety, bilateral wrist surgeries, trigger finger release, right shoulder surgery, right foot surgery, bilateral carpal tunnel release, bilateral BKAs in 12/2019. Pt presented on 06/04/20 with bilateral BKA wound dehiscence. Pt was seen by Ortho and decided to proceed with bilateral AKAs on 06/04/20. Admitted to CIR on 3/27.  Spoke with pt's husband outside room while pt was being transferred to new bed. Pt's husband reports that pt did very well at dinner last night, completing 100% of mashed potatoes and gravy and 100% of salad.  Later spoke with pt in room with husband at bedside. Pt reports PO intake remains variable. She is consuming ProSource Plus and is willing to continue these. She consumed one Glucerna shake and was concerned about it causing her blood sugar to rise. She is willing to consume Glucerna shake if offered at a different time of day.  Meal Completion: 20-75%  Medications reviewed and include: ProSource Plus daily, cholecalciferol, colace, Glucerna shake daily, trinsicon, SSI, novolog 4 units TID with meals, lantus 12 units BID, miralax,  prednisone  Labs reviewed: BUN 33, creatinine 1.15, hemoglobin 7.5 CBG's: 215-319 x 24 hours  Diet Order:   Diet Order            Diet Carb Modified Fluid consistency: Thin; Room service appropriate? Yes  Diet effective now                 EDUCATION NEEDS:   Education needs have been addressed  Skin:  Skin Assessment: Skin Integrity Issues: Incisions: right leg s/p AKA, left leg s/p AKA Other: skin tear to right arm, left arm  Last BM:  06/11/20 medium type 7  Height:   Ht Readings from Last 1 Encounters:  06/10/20 3' 6.91" (1.09 m)    Weight:   Wt Readings from Last 1 Encounters:  06/08/20 80.4 kg    BMI:  Body mass index is 67.67 kg/m.  Estimated Nutritional Needs:   Kcal:  1800-2000  Protein:  85-100 grams  Fluid:  1.8-2.0 L    Mertie Clause, MS, RD, LDN Inpatient Clinical Dietitian Please see [facility] for contact information.

## 2020-06-12 NOTE — Progress Notes (Signed)
Physical Therapy Session Note  Patient Details  Name: Kaitlin Hudson MRN: [id number] Date of Birth: 03/12/1956  Today's Date: 06/12/2020 PT Individual Time: 0908-1005, 2683-4196, and 1210-1245 PT Individual Time Calculation (min): 57 min, 10 min, and 35 min   Short Term Goals: Week 1:  PT Short Term Goal 1 (Week 1): Pt will roll side to side using side rails w/ min A PT Short Term Goal 2 (Week 1): Pt will transfers sup to long sit w/ mod A PT Short Term Goal 3 (Week 1): Pt will transfer bed<> power chair w/ mod A, anterior-posterior or SB  Skilled Therapeutic Interventions/Progress Updates:     Session 1: Patient in bed with RN in the room changing residual limb dressings upon PT arrival. Patient alert and agreeable to PT session. Patient reported 10/10 L residual limb pain and 6/10 R residual limb pain during session, RN aware and premedicated patient for therapy. PT provided repositioning, rest breaks, and distraction as pain interventions throughout session. Noted significant drainage on bed sheets from dressing, RN reported that MD rounded and aware.   Patient with poor emotional state throughout sessions today. Frequently stated, "just let me go home to [place]," and very tearful throughout sessions. RN aware.  PT applied L stump sock with total A at beginning of session. Required increased time due to patient's increased sensitivity to touch. Provided education on pain neuroscience reeducation and strategies to reduce pain response to touch over time. Educated patient on desensitization technique starting near her hip where she is not sensitive to touch and progressing down her limb until she can tolerate her touch then progress to other peoples touch. Encouraged her to perform this herself and with her husband throughout the day, patient in agreement.   Therapeutic Activity: Bed Mobility: Patient performed supine to sit with mod I with use of bed rails.  Transfers: Patient performed a slide  board transfer bed>power w/c with max A to initiate then min A to complete the transfer and total A for board placement. Provided cues for hand placement, board placement, and head-hips relationship for proper technique and decreased assist with transfers.   Wheelchair Mobility:  Changed out w/c cushion to Roho hybrid for reduced pressure on sacrum. Patient propelled wheelchair within the room with supervision for tight spaces, otherwise mod I. Provided verbal cues and demonstration for pressure relief and repositioning in the chair as well as how to switch between controls (steering versus chair functions).   Patient in power w/c with her husband arriving to the room at end of session with breaks locked, seat belt secured, and all needs within reach.   Session 2: Returned to offer patient an opportunity to transfer back to bed, patient very uncomfortable in power w/c. Patient awaiting air mattress to arrive before she is able to return to bed, RN following-up. Attempted to reduce pressure in Roho section of the w/c cushion for improved sitting tolerance. Air hose not threaded properly and unable to reduce inflation at this time. Had patient perform pressure relief using tilt and recline features until some relief for improved sitting tolerance until the air mattress arrives. Patient in power w/c with her husband in the room, as above, at end of session.   Session 3: Patient in power w/c with her husband in the room upon PT arrival. Patient alert and agreeable to PT session. Patient tearful and continued to report 10/10 residual limb pain during session, RN aware. PT provided repositioning, rest breaks, and distraction as pain interventions  throughout session.   Assisted RN with switching bed and educated patient and RN on bed features and proper use.   Therapeutic Activity: Bed Mobility: Patient performed sit to supine with min A for trunk control. Provided verbal cues for controlled lowering of her  trunk, and patient threw her trunk toward the matress wit poor safety awareness with returning to the bed. Transfers: Patient performed a slide board transfer downhill, using chair elevator feature, power w/c>bed with min A and total A for board placement. Provided cues for hand placement, board placement, and head-hips relationship for proper technique and decreased assist with transfers.   Wheelchair Mobility:  PT set up power w/c for transfer due to patient's elevated pain level and urgency to return to bed.  Patient continued to be very emotional during session. Provided therapeutic listening and coping strategies for patient and her husband during session. Reapplied R residual limb dressing and demonstrated donning R stump sock to patient's husband at end of session due to loss of sock and dressing during transfer back to bed.   Patient in bed on air mattress with alternating inflation at end of session with breaks locked, bed alarm set, and all needs within reach.    Therapy Documentation Precautions:  Precautions Precautions: Fall Precaution Comments: dressings difficult to maintain, shrinkers to be applied. Restrictions Weight Bearing Restrictions: Yes RLE Weight Bearing: Non weight bearing LLE Weight Bearing: Non weight bearing   Therapy/Group: Individual Therapy  Ruta Capece L Yonis Carreon PT, DPT  06/12/2020, 1:20 PM

## 2020-06-12 NOTE — Progress Notes (Signed)
PROGRESS NOTE   Subjective/Complaints: Can't get comfortable because buttocks raw from areas of breakown and difficult to move in bed with both AKA's/pain. Doesn't want to take eliquis because she was told she could stop it by cardiology (found the note)   ROS: Patient denies fever, rash, sore throat, blurred vision, nausea, vomiting, diarrhea, cough, shortness of breath or chest pain  Objective:   No results found. Recent Labs    06/11/20 0520 06/12/20 0455  WBC 11.8* 10.9*  HGB 7.3* 7.5*  HCT 23.0* 24.5*  PLT 319 348   Recent Labs    06/11/20 0520  NA 138  K 5.0  CL 111  CO2 19*  GLUCOSE 236*  BUN 33*  CREATININE 1.15*  CALCIUM 8.8*    Intake/Output Summary (Last 24 hours) at 06/12/2020 1009 Last data filed at 06/11/2020 2100 Gross per 24 hour  Intake 480 ml  Output --  Net 480 ml        Physical Exam: Vital Signs Blood pressure 130/65, pulse 83, temperature 98.1 F (36.7 C), resp. rate 17, height 3' 6.91" (1.09 m), weight 80.4 kg, SpO2 100 %.  Constitutional: No distress . Vital signs reviewed. Appears uncomfortable HEENT: EOMI, oral membranes moist Neck: supple Cardiovascular: RRR without murmur. No JVD    Respiratory/Chest: CTA Bilaterally without wheezes or rales. Normal effort    GI/Abdomen: BS +, non-tender, non-distended Ext: no clubbing, cyanosis, or edema Psych: flat, depressed Skin: small sacral skin tears, bruising below incision left>right stump. Otherwise well approximated. serosang discharge. Small blisters on proximal thights.  Neuro: Pt is cognitively appropriate with normal insight, memory, and awareness. Cranial nerves 2-12 are intact. Sensory exam is normal. Reflexes are 2+ in all 4's. Fine motor coordination is intact. No tremors. Motor function is grossly 5/5 UE and 3- to 3/5 LE R>L. --stable appearance Musculoskeletal: both legs edematous, and  tender to touch.       Assessment/Plan: 1. Functional deficits which require 3+ hours per day of interdisciplinary therapy in a comprehensive inpatient rehab setting.  Physiatrist is providing close team supervision and 24 hour management of active medical problems listed below.  Physiatrist and rehab team continue to assess barriers to discharge/monitor patient progress toward functional and medical goals  Care Tool:  Bathing    Body parts bathed by patient: Right arm,Left arm,Chest,Abdomen,Front perineal area,Right upper leg,Left upper leg,Face   Body parts bathed by helper: Buttocks,Right upper leg,Left upper leg Body parts n/a: Right lower leg,Left lower leg   Bathing assist Assist Level: Moderate Assistance - Patient 50 - 74%     Upper Body Dressing/Undressing Upper body dressing   What is the patient wearing?: Pull over shirt    Upper body assist Assist Level: Minimal Assistance - Patient > 75%    Lower Body Dressing/Undressing Lower body dressing      What is the patient wearing?: Pants,Incontinence brief     Lower body assist Assist for lower body dressing: Maximal Assistance - Patient 25 - 49%     Toileting Toileting    Toileting assist Assist for toileting: Total Assistance - Patient < 25%     Transfers Chair/bed transfer  Transfers assist  Chair/bed transfer assist level: Maximal Assistance - Patient 25 - 49% (slide board and chuck pad)     Locomotion Ambulation   Ambulation assist   Ambulation activity did not occur: N/A          Walk 10 feet activity   Assist           Walk 50 feet activity   Assist           Walk 150 feet activity   Assist           Walk 10 feet on uneven surface  activity   Assist Walk 10 feet on uneven surfaces activity did not occur: N/A         Wheelchair     Assist Will patient use wheelchair at discharge?: Yes Type of Wheelchair: Power    Wheelchair assist level: Supervision/Verbal  cueing Max wheelchair distance: 300    Wheelchair 50 feet with 2 turns activity    Assist        Assist Level: Supervision/Verbal cueing   Wheelchair 150 feet activity     Assist      Assist Level: Supervision/Verbal cueing   Blood pressure 130/65, pulse 83, temperature 98.1 F (36.7 C), resp. rate 17, height 3' 6.91" (1.09 m), weight 80.4 kg, SpO2 100 %.  Medical Problem List and Plan: 1.  Deficits with mobility, transfers, endurance, self-care secondary to wound dehiscence status post bilateral AKA.             Patient may shower with incisions covered             Goals/ELOS: Min/mod a at wheelchair level/7-10+ days            will revisit LOS on TOMORROS 2.  DVT Prophylaxis/Anticoagulation, hx of DVT: Pharmaceutical:   -wounds still draining   -hgb stable around 7.5 today 3/31  -discussed pt's anticoagulation, hx of LLE DVT. Given the documentation I can find, her bleeding risk, we'll go ahead and stop eliquis and resume prophylactic lovenox.         Antiplatelets: Clopidogrel 75, ASA 81 3. Pain Management: Oxycodone as needed             Robaxin 500 qid scheduled             Cymbalta 60 daily             3/29  added oxycontin 10mg  q12---continue 4. Mood/anxiety/depression: Team to provide ongoing egosupport             Cymbalta 60 mg daily              -neuropsych input    -improve paincontrol 5. Neuropsych: This patient is capable of making decisions on her own behalf. 6. Skin/Wound Care: dry dressings ordered for residual limbs  -  stump socks per [facility]  -foam dressing to sacrum, requested air mattress for comfort/pressure relief 7. Fluids/Electrolytes/Nutrition: Routine I/OS             labs reviewed. Mild elevation of AST--likely reactive 8.  Vitamin D deficiency             Vitamin D 1000 units daily 9.  Slow transit constipation---intake starting to pick up             colace, miralax  -results with sorbitol  -push po               10.  Type 1  diabetes mellitus type II with hyperglycemia  SSI             Labile at present             Patient has insulin pump. This was not resumed. She sent it home  -3/31 titrate lantus to 12u bid 11.  PVD             See #2  12.  Essential hypertension             Lopressor 12.5 twice daily, Bumex 2 mg daily  -controlled 3/29 13.  Hyperlipidemia             Zestril 40 mg daily 14.  Acute on chronic renal failure:  -BUN improved today--continue to push fluids  -K+ elevated, holding  ACE for now  -recheck Monday 15.  CAD             See #2 16.  RA             Prednisone 12.5 mg daily, 2.5 mg nightly 17.  Leukocytosis             Afebrile             wbc's falling--11.8, steroid component 18.  Acute blood loss anemia             Hemoglobin holding at 7.5 3/31             recheck Monday    LOS: 4 days A FACE TO FACE EVALUATION WAS PERFORMED  Ranelle Oyster 06/12/2020, 10:09 AM

## 2020-06-12 NOTE — Progress Notes (Signed)
Occupational Therapy Session Note  Patient Details  Name: Kaitlin Hudson MRN: [id number] Date of Birth: 09/28/1955  Today's Date: 06/12/2020 OT Individual Time: 0700-0800 OT Individual Time Calculation (min): 60 min    Short Term Goals: Week 1:  OT Short Term Goal 1 (Week 1): patient will complete rolling in bed with CS, supine to sitting with mod A OT Short Term Goal 2 (Week 1): patient will increase anterior/posterior transfer to/from commode, bed, and w/c with mod A of one OT Short Term Goal 3 (Week 1): patient will complete adl tasks with set up/min A OT Short Term Goal 4 (Week 1): patient will complete toileting commode level with max A of one  Skilled Therapeutic Interventions/Progress Updates:     Pt received in bed finishing breakfast and pt reporting pain in B residual limbs and buttocks. Pt requires coaching through repositioning/bed features to improve Ind with bed mobility. ADL:  Pt completes bathing with EOB/bed level with increased time to scoot, navigate bed features and take breaks d/t pain. Overall pt teary throughout and very discouraged. Support/encouragement provided. Pt completes UB dressing with with set up for shirt. No LOB noted sitting up in bed Pt completes LB dressing with MIN A rolling in B directions to don pants with HOH A to find waistband as pt gets very panicky in sidelying rushing/getting frustrated.  Pt completes toileting with bedpan at bed level d/t urgency with pt completing hygiene but A for other components. OT doffs shrinker and rewraps LLE. Significant drainage from RLE therefore OT lets wraps remain in place so MD can look at residual limb.  Pt left at end of session in bed with exit alarm on, call light in reach and all needs met   Therapy Documentation Precautions:  Precautions Precautions: Fall Precaution Comments: dressings difficult to maintain, shrinkers to be applied. Restrictions Weight Bearing Restrictions: Yes RLE Weight Bearing:  Non weight bearing LLE Weight Bearing: Non weight bearing General:   Vital Signs: Therapy Vitals Temp: 98.1 F (36.7 C) Pulse Rate: 83 Resp: 17 BP: 130/65 Patient Position (if appropriate): Lying Oxygen Therapy SpO2: 100 % O2 Device: Room Air Pain: Pain Assessment Pain Scale: 0-10 Pain Score: 0-No pain ADL: ADL Eating: Set up Where Assessed-Eating: Bed level Grooming: Setup Where Assessed-Grooming: Bed level Upper Body Bathing: Setup Where Assessed-Upper Body Bathing: Bed level Lower Body Bathing: Maximal assistance Where Assessed-Lower Body Bathing: Bed level Upper Body Dressing: Minimal assistance Where Assessed-Upper Body Dressing: Bed level Lower Body Dressing: Maximal assistance Where Assessed-Lower Body Dressing: Bed level Toileting: Maximal assistance Where Assessed-Toileting: Bed level ADL Comments: ADL bed level this session Vision   Perception    Praxis   Exercises:   Other Treatments:     Therapy/Group: Individual Therapy  Tonny Branch 06/12/2020, 6:45 AM

## 2020-06-13 DIAGNOSIS — F411 Generalized anxiety disorder: Secondary | ICD-10-CM

## 2020-06-13 LAB — GLUCOSE, CAPILLARY
Glucose-Capillary: 191 mg/dL — ABNORMAL HIGH (ref 70–99)
Glucose-Capillary: 249 mg/dL — ABNORMAL HIGH (ref 70–99)
Glucose-Capillary: 245 mg/dL — ABNORMAL HIGH (ref 70–99)
Glucose-Capillary: 150 mg/dL — ABNORMAL HIGH (ref 70–99)

## 2020-06-13 MED ORDER — LISINOPRIL 20 MG PO TABS: 40.0000 mg | ORAL_TABLET | Freq: Every day | ORAL | Status: DC

## 2020-06-13 MED ORDER — INSULIN GLARGINE 100 UNIT/ML ~~LOC~~ SOLN
14.0000 [IU] | Freq: Two times a day (BID) | SUBCUTANEOUS | Status: DC
  Administered 2020-06-13 – 2020-06-20 (×14): 14 [IU] via SUBCUTANEOUS
  Filled 2020-06-13 (×15): qty 0.14

## 2020-06-13 MED ORDER — OXYCODONE HCL ER 10 MG PO T12A
20.0000 mg | EXTENDED_RELEASE_TABLET | Freq: Two times a day (BID) | ORAL | Status: DC
  Administered 2020-06-13 – 2020-06-17 (×8): 20 mg via ORAL
  Filled 2020-06-13 (×8): qty 2

## 2020-06-13 NOTE — Progress Notes (Signed)
Occupational Therapy Session Note  Patient Details  Name: Kaitlin Hudson MRN: [id number] Date of Birth: 08/01/1955  Today's Date: 06/13/2020 OT Individual Time: 0700-0800 OT Individual Time Calculation (min): 60 min    Short Term Goals: Week 1:  OT Short Term Goal 1 (Week 1): patient will complete rolling in bed with CS, supine to sitting with mod A OT Short Term Goal 2 (Week 1): patient will increase anterior/posterior transfer to/from commode, bed, and w/c with mod A of one OT Short Term Goal 3 (Week 1): patient will complete adl tasks with set up/min A OT Short Term Goal 4 (Week 1): patient will complete toileting commode level with max A of one  Skilled Therapeutic Interventions/Progress Updates:    1:1. Pt received in bed tearful but agreeable to OT. Pt reportin 8/10 pain in buttocks/residual limb and RN delivers medicaiotn. Deep breathing cues provided when pt hyperventilating. Pt completes UB bathing and dressing with set up. LB bathing/dressing with MIN A for rolling and pulling pants up hips. OT changes dressings on BLE with RN in room to look at incisions. significanlty less drainage today from LLE noted compared to yesterday. Pt very teary and stating "im sorry" throughout. Support and encouargement provided. Pt making semi frequent comments about wishing to die and "would you just shoot me, I cant handle this pain." Neuropsych to come in today at 8am but pt states, "hes the same guy as last time, and until he has walked my shoes he can just go." Continued education on coping and breathing techniuqes. Exited session with pt seated in bed, exit alarm on and call light in reach   Therapy Documentation Precautions:  Precautions Precautions: Fall Precaution Comments: dressings difficult to maintain, shrinkers to be applied. Restrictions Weight Bearing Restrictions: Yes RLE Weight Bearing: Non weight bearing LLE Weight Bearing: Non weight bearing General:   Vital Signs: Therapy  Vitals Temp: 98.2 F (36.8 C) Temp Source: Oral Pulse Rate: 94 Resp: 20 BP: (!) 132/59 Patient Position (if appropriate): Lying Oxygen Therapy SpO2: 98 % O2 Device: Room Air Pain:   ADL: ADL Eating: Set up Where Assessed-Eating: Bed level Grooming: Setup Where Assessed-Grooming: Bed level Upper Body Bathing: Setup Where Assessed-Upper Body Bathing: Bed level Lower Body Bathing: Maximal assistance Where Assessed-Lower Body Bathing: Bed level Upper Body Dressing: Minimal assistance Where Assessed-Upper Body Dressing: Bed level Lower Body Dressing: Maximal assistance Where Assessed-Lower Body Dressing: Bed level Toileting: Maximal assistance Where Assessed-Toileting: Bed level ADL Comments: ADL bed level this session Vision   Perception    Praxis   Exercises:   Other Treatments:     Therapy/Group: Individual Therapy  Shon Hale 06/13/2020, 6:45 AM

## 2020-06-13 NOTE — Progress Notes (Signed)
Physical Therapy Session Note  Patient Details  Name: Kaitlin Hudson MRN: [id number] Date of Birth: 08-31-1955  Today's Date: 06/13/2020 PT Individual Time: 1105-1205 and 1450-1550 PT Individual Time Calculation (min): 60 min and 60 min  Short Term Goals: Week 1:  PT Short Term Goal 1 (Week 1): Pt will roll side to side using side rails w/ min A PT Short Term Goal 2 (Week 1): Pt will transfers sup to long sit w/ mod A PT Short Term Goal 3 (Week 1): Pt will transfer bed<> power chair w/ mod A, anterior-posterior or SB  Skilled Therapeutic Interventions/Progress Updates:     Session 1: Patient in bed with her husband in the room upon PT arrival. Patient alert and agreeable to PT session. Patient reported 10/10 L residual limb pain and 8/10 R residual limb pain and per nursing currently has ben provided all available pain medication. PT provided repositioning, rest breaks, and distraction as pain interventions throughout session. Discussed pain management and non-pharmacological techniques performed in previous session, patient reports attempts at desensitization and imagery without success. Performed gentle desensitization with light touch and pressure to hip and upper thigh as tolerated during education/discussions below.  Due to elevated pain levels, focused session on d/c planning, discussing DME recs, and assistance available at home. Per patient and her husband, her husband is the only person that will be assisting her at d/c, other family may be available intermittently throughout the week, but no-one is available consistently. Patient's husband reports that he currently has lifting restrictions due to previous back surgery and current back pain and has an outpatient procedure scheduled on 07/10/2020 for his back. Discussed upgrading patient's goals to CGA and extending patient's stay to the end of next week to ensure patient is able to transfer safely with her husband at d/c. Patient and her husband  in agreement. Educated on use of a mechanical lift if progress is limited, however patient very motivated to progress and not in favor of lift transfers.   Discussed power wheelchair mobility, patient and her husband are in agreement that power mobility with the tilt feature for pressure relief, and elevating seat for improved independence with transfers, ADL, and IADLs. Will reach out to [employer] to set up w/c eval and trial FWD power chair due to tight corners in home and gravel driveway to w/c van.   Patient in bed with her eyes closed appearing more relaxed with her husband in the room at end of session with breaks locked, 4 rails up, and all needs within reach.   Session 2:  Patient in bed with her husband in the room upon PT arrival. Patient alert and agreeable to PT session. Patient reported 7/10 residual limb pain during session, RN made aware. PT provided repositioning, rest breaks, and distraction as pain interventions throughout session. Noted improved tolerance with mobility throughout session this afternoon.   Obtained FWD power w/c and 20"x20" Roho cushion for patient to trial during session.   Therapeutic Activity: Bed Mobility: Patient performed supine to/from sit with Mod I with use of hospital bed features. Discussed home set-up, patient with 1 grab rail on bed at home with a flat bed, educated on strategies to work on progressing to simulating home set-up.  Transfers: Patient performed slide board transfers bed<>w/c with mattress fully inflated and w/c set-up for downhill transfers for reduced effort and decreased assist using w/c seat elevator with min A and max A for board placement. Provided cues for hand placement, board placement, and  head-hips relationship for proper technique and decreased assist with transfers. Patient with forward LOB in the middle of the transfer requiring mod A when transferring to the power chair due to decreased trunk control. Patient's husband provided  CGA-close supervision from behind during all transfers for safety.   Wheelchair Mobility:  Assessed and adjusted Roho cushion to correct inflation, 1.5-2" inflation between IT and bottom of the seat. Patient reported improved sitting tolerance in full Roho cushion. Educated on differenced between FWD and MWD and use of seat function buttons in new power w/c. Patient demos use of all seat features except elevating leg rests. Patient propelled wheelchair >150 feet with 3-90 deg turns and 1-180 deg turn with supervision and cues for turning technique with need for increased turning radius from behind due to FWD. Will continue to trial FWD, patient concerned about changes in driving mechanics and will need increased practice to determine her preference for FWD vs MWD.  Measured base of w/c and seat hight range in FWD power w/c. Per patient's husbands measurements the w/c will trespass all doorways in the home. Patient's bed may need to be elevated to allow for downhill transfers OOB. Her husband reports that a box spring was removed previously and can be placed back to elevate the bed, will measure following. Educated on getting help with this to maintain his back precautions, and he was in agreement.   Patient in bed in improved spirits this afternoon with her husband in the room at end of session with breaks locked, 4 rails up, and all needs within reach.   Therapy Documentation Precautions:  Precautions Precautions: Fall Precaution Comments: dressings difficult to maintain, shrinkers to be applied. Restrictions Weight Bearing Restrictions: Yes RLE Weight Bearing: Non weight bearing LLE Weight Bearing: Non weight bearing   Therapy/Group: Individual Therapy  Dejuana Weist L Nakea Gouger PT, DPT  06/13/2020, 12:55 PM

## 2020-06-13 NOTE — Consult Note (Signed)
Neuropsychological Consultation   Patient:   Kaitlin Hudson   DOB:   26-Jul-1955  MR Number:  [id number]  Location:  [facility] [address] CENTER [id number] [place] [address] Dept: [phone] Loc: [phone]           Date of Service:   06/13/2020  Start Time:   9 AM End Time:   10 AM  Provider/Observer:  Arley Phenix, Psy.D.       Clinical Neuropsychologist       Billing Code/Service: [phone]  Chief Complaint:    [place] is a 65 year old female with prior medical history including history of type 1 diabetes with insulin pump, peripheral vascular disease, essential hypertension, hyperlipidemia, headaches, DVT, depression, CAD, RA, anxiety, bilateral wrist surgeries, trigger finger release, right shoulder surgery, right foot surgery, bilateral carpal tunnel release, bilateral TKAs October 2021.  Patient presented on 06/04/2020 with bilateral BKA wound dehiscence status post bilateral BKA.  Patient was noticed to have venous and lymphatic insufficiency with significant edema and continue to showed nonhealing surgical incisions.  Patient also had increasing burning pain.  Ortho determined that he needed to proceed with bilateral AKA on 06/04/2020.  Patient was not able to tolerate wound VAC.  Hospital course has been complicated due to severe postoperative pain, labile blood sugars, leukocytosis, acute blood loss anemia as well as significant anxiety.  Reason for Service:  Patient was referred for neuropsychological consultation due to significant anxiety and difficulty managing acute pain.  Below see HPI for the current admission.  HPI: Female with past medical history/past surgical history of type 1 diabetes with insulin pump, PVD, essential hypertension, hyperlipidemia, headaches, DVT, depression, CAD, RA, anxiety, bilateral wrist surgeries, trigger finger release, right shoulder surgery, right foot surgery,  bilateral carpal tunnel release, bilateral BKAs in 12/2019 presented on 06/04/2020 with bilateral BKA wound dehiscence status post bilateral AKAs.  History taken from chart review, husband, and patient.  Patient was noted to have venous and lymphatic insufficiency with significant edema and appears to have had nonhealing surgical incisions.  She also had increasing burning pain.  She was seen by Ortho and decided to proceed with bilateral AKAs per Dr. Lajoyce Corners on 06/04/2020 with application of wound VAC, which was later DC'd due to intolerance.  Hospital course has been complicated by postoperative pain, labile blood sugars, leukocytosis, acute blood loss anemia, as well as anxiety.  She was seen by therapies and CIR recommended due to decreasing burden of care as well as improving functional mobility at wheelchair level.  Please see preadmission assessment from earlier today as well.  Current Status:  Upon entering the room, the patient was in her bed slightly elevated and describing significant bilateral pain.  She reported that her pain is somewhat worse on her left side than right side but both upper limbs are described as being very painful.  The patient also acknowledges significant anxiety that is exacerbated with pain.  Given the patient's history of a multitude of prior surgeries not to mention the other metabolic disturbance and vascular issues the patient very well could have the development of a complex regional pain syndrome that has become significantly exacerbated with recent surgery attributing to the level of pain she is experiencing.  Patient having significant symptoms of anxiety with a history of anxiety and depression.  While I failed to ask about prior attempts to treat her anxiety and depression and determine whether she has been able to  tolerate SSRI type medications in the past it would make sense to address the underlying depression anxiety at least post discharge.  These medications are likely  not to contribute or help significantly on the unit as they will take 2 to 4 weeks to begin therapy efficacy.  Behavioral Observation: Jacqeline Stayner  presents as a 65 y.o.-year-old Right Caucasian Female who appeared her stated age. her dress was Appropriate and she was Well Groomed and her manners were Appropriate to the situation.  her participation was indicative of Redirectable behaviors.  There were physical disabilities noted.  she displayed an appropriate level of cooperation and motivation.     Interactions:    Active Redirectable  Attention:   abnormal and distracted by internal preoccupations and focus on pain  Memory:   within normal limits; recent and remote memory intact  Visuo-spatial:  not examined  Speech (Volume):  normal  Speech:   normal; normal  Thought Process:  Coherent and Relevant  Though Content:  Rumination; not suicidal and not homicidal  Orientation:   person, place, time/date and situation  Judgment:   Fair  Planning:   Fair  Affect:    Anxious  Mood:    Anxious and Dysphoric  Insight:   Good  Intelligence:   normal  Medical History:   Past Medical History:  Diagnosis Date  . Anxiety   . Arthritis, rheumatoid (HCC)   . Coronary artery disease   . Depression   . Diabetes mellitus without complication (HCC)   . DVT (deep venous thrombosis) (HCC)    Left leg 10/2019  . Headache   . History of blood transfusion   . Hyperlipidemia   . Hypertension   . Latent tuberculosis    pt denies this dx  . Peripheral vascular disease (HCC)    blood clot in left leg  . Type 1 diabetes [facility])          Patient Active Problem List   Diagnosis Date Noted  . Generalized anxiety disorder   . S/P AKA (above knee amputation) bilateral (HCC) 06/08/2020  . Leukocytosis   . Acute blood loss anemia   . Wound dehiscence 06/04/2020  . Dehiscence of amputation stump (HCC)   . Steroid-induced hyperglycemia   . Controlled type 1 diabetes mellitus with  hyperglycemia (HCC)   . Essential hypertension   . Sinus tachycardia   . Postoperative pain   . S/P bilateral below knee amputation (HCC) 12/23/2019  . Below-knee amputation (HCC) 12/19/2019  . Gangrene of both feet (HCC)   . Heel ulcer, right, with necrosis of muscle (HCC)   . Chronic diastolic heart failure (HCC) 09/12/2013  . Rheumatoid arthritis (HCC) 09/12/2013  . IDDM (insulin dependent diabetes mellitus) 09/09/2013  . Cellulitis of right lower extremity 09/08/2013     Psychiatric History:  Patient has a prior history of anxiety including generalized anxiety disorder and depressive disorder which are very likely been exacerbated by her recent and significant medical complications including now bilateral AKA.  Pain is likely exacerbating some of the symptoms and vice versa depression and anxiety are likely exacerbating some of her pain symptoms.  Family Med/Psych History: History reviewed. No pertinent family history.   Impression/DX:  Karson Slater is a 65 year old female with prior medical history including history of type 1 diabetes with insulin pump, peripheral vascular disease, essential hypertension, hyperlipidemia, headaches, DVT, depression, CAD, RA, anxiety, bilateral wrist surgeries, trigger finger release, right shoulder surgery, right foot surgery, bilateral carpal tunnel release, bilateral TKAs  October 2021.  Patient presented on 06/04/2020 with bilateral BKA wound dehiscence status post bilateral BKA.  Patient was noticed to have venous and lymphatic insufficiency with significant edema and continue to showed nonhealing surgical incisions.  Patient also had increasing burning pain.  Ortho determined that he needed to proceed with bilateral AKA on 06/04/2020.  Patient was not able to tolerate wound VAC.  Hospital course has been complicated due to severe postoperative pain, labile blood sugars, leukocytosis, acute blood loss anemia as well as significant anxiety.  Upon entering  the room, the patient was in her bed slightly elevated and describing significant bilateral pain.  She reported that her pain is somewhat worse on her left side than right side but both upper limbs are described as being very painful.  The patient also acknowledges significant anxiety that is exacerbated with pain.  Given the patient's history of a multitude of prior surgeries not to mention the other metabolic disturbance and vascular issues the patient very well could have the development of a complex regional pain syndrome that has become significantly exacerbated with recent surgery attributing to the level of pain she is experiencing.  Patient having significant symptoms of anxiety with a history of anxiety and depression.  While I failed to ask about prior attempts to treat her anxiety and depression and determine whether she has been able to tolerate SSRI type medications in the past it would make sense to address the underlying depression anxiety at least post discharge.  These medications are likely not to contribute or help significantly on the unit as they will take 2 to 4 weeks to begin therapy efficacy.  Disposition/Plan:  Today we worked on coping and adjustment issues around her anxiety and management of acute pain symptoms.  Patient is already had some specific recommendations for how she could address some of her pain symptoms and anxiety behaviorally and cognitively although the level of pain she is experiencing makes those interventions are only minimally helpful at best.  Patient is very focused on her pain and it is likely there are some components of a complex regional pain syndrome potentially contributing to the level and severity of pain she is experiencing as the patient has had multiple injuries and orthopedic interventions on top of her significant vascular disease and metabolic disturbance.  Diagnosis:    No diagnosis found.         Electronically  Signed   _______________________ Arley Phenix, Psy.D. Clinical Neuropsychologist

## 2020-06-13 NOTE — Progress Notes (Addendum)
PROGRESS NOTE   Subjective/Complaints: Still in pain, especially left leg but feeling better than yesterday. Air mattress helped her backside. Asked what was the best position to be in for her legs. Spoke with Dr. Kieth Brightly about his visit with her where they discussed her pain control and anxiety.  ROS: Patient denies fever, rash, sore throat, blurred vision, nausea, vomiting, diarrhea, cough, shortness of breath or chest pain, joint or back pain, headache, or mood change.   Objective:   No results found. Recent Labs    06/11/20 0520 06/12/20 0455  WBC 11.8* 10.9*  HGB 7.3* 7.5*  HCT 23.0* 24.5*  PLT 319 348   Recent Labs    06/11/20 0520  NA 138  K 5.0  CL 111  CO2 19*  GLUCOSE 236*  BUN 33*  CREATININE 1.15*  CALCIUM 8.8*   No intake or output data in the 24 hours ending 06/13/20 1025      Physical Exam: Vital Signs Blood pressure (!) 122/59, pulse 99, temperature 98.4 F (36.9 C), temperature source Oral, resp. rate 19, height 3' 6.91" (1.09 m), weight 80.4 kg, SpO2 97 %.  Constitutional: appears more comfortable than yesterday. Vital signs reviewed. HEENT: EOMI, oral membranes moist Neck: supple Cardiovascular: RRR without murmur. No JVD    Respiratory/Chest: CTA Bilaterally without wheezes or rales. Normal effort    GI/Abdomen: BS +, non-tender, non-distended Ext: no clubbing, cyanosis, or edema Psych: still flat, but more animated than yesterday Skin: small sacral skin tears, left AKA incision still with substantial seros-sang drainage. Left AKA with only slight drainage.  Neuro: Pt is cognitively appropriate with normal insight, memory, and awareness. Cranial nerves 2-12 are intact. Sensory exam is normal. Reflexes are 2+ in all 4's. Fine motor coordination is intact. No tremors. Motor function is grossly 5/5 UE and 3- to 3/5 LE R>L Musculoskeletal: left stump more tender than left. Both still  edematous.      Assessment/Plan: 1. Functional deficits which require 3+ hours per day of interdisciplinary therapy in a comprehensive inpatient rehab setting.  Physiatrist is providing close team supervision and 24 hour management of active medical problems listed below.  Physiatrist and rehab team continue to assess barriers to discharge/monitor patient progress toward functional and medical goals  Care Tool:  Bathing    Body parts bathed by patient: Right arm,Left arm,Chest,Abdomen,Front perineal area,Right upper leg,Left upper leg,Face   Body parts bathed by helper: Buttocks,Right upper leg,Left upper leg Body parts n/a: Right lower leg,Left lower leg   Bathing assist Assist Level: Moderate Assistance - Patient 50 - 74%     Upper Body Dressing/Undressing Upper body dressing   What is the patient wearing?: Pull over shirt    Upper body assist Assist Level: Minimal Assistance - Patient > 75%    Lower Body Dressing/Undressing Lower body dressing      What is the patient wearing?: Pants,Incontinence brief     Lower body assist Assist for lower body dressing: Maximal Assistance - Patient 25 - 49%     Toileting Toileting    Toileting assist Assist for toileting: Total Assistance - Patient < 25%     Transfers Chair/bed transfer  Transfers  assist     Chair/bed transfer assist level: Moderate Assistance - Patient 50 - 74% Chair/bed transfer assistive device: Sliding board   Locomotion Ambulation   Ambulation assist   Ambulation activity did not occur: N/A          Walk 10 feet activity   Assist           Walk 50 feet activity   Assist           Walk 150 feet activity   Assist           Walk 10 feet on uneven surface  activity   Assist Walk 10 feet on uneven surfaces activity did not occur: N/A         Wheelchair     Assist Will patient use wheelchair at discharge?: Yes Type of Wheelchair: Power    Wheelchair  assist level: Supervision/Verbal cueing Max wheelchair distance: 300    Wheelchair 50 feet with 2 turns activity    Assist        Assist Level: Supervision/Verbal cueing   Wheelchair 150 feet activity     Assist      Assist Level: Supervision/Verbal cueing   Blood pressure (!) 122/59, pulse 99, temperature 98.4 F (36.9 C), temperature source Oral, resp. rate 19, height 3' 6.91" (1.09 m), weight 80.4 kg, SpO2 97 %.  Medical Problem List and Plan: 1.  Deficits with mobility, transfers, endurance, self-care secondary to wound dehiscence status post bilateral AKA.             Patient may shower with incisions covered             Goals/ELOS: Min/mod a at wheelchair level/7-10+ days            -4/1 team to discuss dc date today 2.  DVT Prophylaxis/Anticoagulation, hx of DVT (8/21):     - Given the documentation I can find from cardiology, her bleeding risk, we have stopped eliquis and resumed prophylactic lovenox 40mg  QD         Antiplatelets: Clopidogrel 75, ASA 81 3. Pain Management: Oxycodone as needed             Robaxin 500 qid scheduled             Cymbalta 60 daily  -have reviewed importance of edema control, massage/desensitization             3/29  added oxycontin 10mg  q12  4/1 still having a lot of pain. Not using prn oxy consistently. Will try oxycontin 20mg  q12 watching closely for tolerance/neurosedation  4. Mood/anxiety/depression: Team to provide ongoing egosupport             Cymbalta 60 mg daily              -appreciate neuropsych input    -improve pain control as above 5. Neuropsych: This patient is capable of making decisions on her own behalf. 6. Skin/Wound Care: dry dressings daily to AKA incisions  - stump socks for dressing retention  -foam dressing to sacrum  -air mattress for comfort/pressure relief 7. Fluids/Electrolytes/Nutrition: intake inconsistent  -pt understands importance of nutrition             labs reviewed. Mild elevation of  AST--likely reactive--recheck next week 8.  Vitamin D deficiency             Vitamin D 1000 units daily 9.  Slow transit constipation---intake starting to pick up  colace, miralax  -results with sorbitol  -patient trying to push po               10.  Type 1 diabetes mellitus type II with hyperglycemia             SSI             Patient has insulin pump. This was not resumed. She sent it home   CBG (last 3)  Recent Labs    06/12/20 2003 06/12/20 2113 06/13/20 0624  GLUCAP 182* 170* 191*     -3/31 titrated lantus to 12u bid---improving control  -4/1 increase lantus to 14u bid 11.  PVD             See #2  12.  Essential hypertension             Lopressor 12.5 twice daily, Bumex 2 mg daily  -controlled 3/29 13.  Hyperlipidemia             Zestril 40 mg daily 14.  Acute on chronic renal failure:  -BUN improved 3/30  -K+ still sl elevated. Continue to hold  ACE for now  -recheck Monday 15.  CAD             See #2 16.  RA             Prednisone 12.5 mg daily, 2.5 mg nightly 17.  Leukocytosis             Afebrile             wbc's falling--10.9 3/31 18.  Acute blood loss anemia             Hemoglobin holding at 7.5 3/31             recheck Monday    LOS: 5 days A FACE TO FACE EVALUATION WAS PERFORMED  Ranelle Oyster 06/13/2020, 10:25 AM

## 2020-06-14 DIAGNOSIS — M069 Rheumatoid arthritis, unspecified: Secondary | ICD-10-CM

## 2020-06-14 DIAGNOSIS — F411 Generalized anxiety disorder: Secondary | ICD-10-CM

## 2020-06-14 LAB — GLUCOSE, CAPILLARY
Glucose-Capillary: 78 mg/dL (ref 70–99)
Glucose-Capillary: 238 mg/dL — ABNORMAL HIGH (ref 70–99)
Glucose-Capillary: 267 mg/dL — ABNORMAL HIGH (ref 70–99)
Glucose-Capillary: 231 mg/dL — ABNORMAL HIGH (ref 70–99)

## 2020-06-14 NOTE — Progress Notes (Signed)
PROGRESS NOTE   Subjective/Complaints:   Patient reports she is uncomfortable, on the edge of pain.  She rates her pain as a 5-6 out of 10.  She has taken pain medicines but this morning but it was very early.  Her last bowel movement was 2 days ago and she denies constipation.  She does admit that it is very cold in the room-and would like a blanket.  She specifically asked questions about power wheelchairs and prosthetics and how much money she should save for one or the other.  She does not feel nor her husband, that they can afford both.  I went over with them about transfer prosthetics, and to focus more on the power wheelchair.  They do have a disability [employer], that they are paying for monthly.    ROS:  Pt denies SOB, abd pain, CP, N/V/C/D, and vision changes   Objective:   No results found. Recent Labs    06/12/20 0455  WBC 10.9*  HGB 7.5*  HCT 24.5*  PLT 348   No results for input(s): NA, K, CL, CO2, GLUCOSE, BUN, CREATININE, CALCIUM in the last 72 hours.  Intake/Output Summary (Last 24 hours) at 06/14/2020 1615 Last data filed at 06/14/2020 1320 Gross per 24 hour  Intake 338 ml  Output --  Net 338 ml        Physical Exam: Vital Signs Blood pressure (!) 117/54, pulse (!) 101, temperature 99.2 F (37.3 C), resp. rate 19, height 3' 6.91" (1.09 m), weight 80.4 kg, SpO2 97 %.   General: awake, alert, appropriate, -patient is sitting up in bed ; the room is very cold ; her husband is at bedside ; NAD HENT: conjugate gaze; oropharynx moist CV: Borderline tachycardic rate; no JVD Pulmonary: CTA B/L; no W/R/R- good air movement GI: soft, NT, ND, (+)BS Psychiatric: interactive, but slightly anxious about the her AKAs and how to handle in the long term Neurological: Ox3  Skin: small sacral skin tears, left AKA incision still with substantial seros-sang drainage. Left AKA with only slight drainage. She also has a  large skin tear on the right forearm-that is draining slightly.  And is covered with Kerlix (she reports it is due to Tegaderm pulling her skin ).   Neuro: Pt is cognitively appropriate with normal insight, memory, and awareness. Cranial nerves 2-12 are intact. Sensory exam is normal. Reflexes are 2+ in all 4's. Fine motor coordination is intact. No tremors. Motor function is grossly 5/5 UE and 3- to 3/5 LE R>L Musculoskeletal: left stump more tender than left. Both still edematous. Wrapped- no drainage seen     Assessment/Plan: 1. Functional deficits which require 3+ hours per day of interdisciplinary therapy in a comprehensive inpatient rehab setting.  Physiatrist is providing close team supervision and 24 hour management of active medical problems listed below.  Physiatrist and rehab team continue to assess barriers to discharge/monitor patient progress toward functional and medical goals  Care Tool:  Bathing    Body parts bathed by patient: Right arm,Left arm,Chest,Abdomen,Front perineal area,Right upper leg,Left upper leg,Face   Body parts bathed by helper: Buttocks,Right upper leg,Left upper leg Body parts n/a: Right lower leg,Left lower  leg   Bathing assist Assist Level: Moderate Assistance - Patient 50 - 74%     Upper Body Dressing/Undressing Upper body dressing   What is the patient wearing?: Pull over shirt    Upper body assist Assist Level: Minimal Assistance - Patient > 75%    Lower Body Dressing/Undressing Lower body dressing      What is the patient wearing?: Pants,Incontinence brief     Lower body assist Assist for lower body dressing: Maximal Assistance - Patient 25 - 49%     Toileting Toileting    Toileting assist Assist for toileting: Total Assistance - Patient < 25%     Transfers Chair/bed transfer  Transfers assist     Chair/bed transfer assist level: Moderate Assistance - Patient 50 - 74% Chair/bed transfer assistive device: Sliding board    Locomotion Ambulation   Ambulation assist   Ambulation activity did not occur: N/A          Walk 10 feet activity   Assist           Walk 50 feet activity   Assist           Walk 150 feet activity   Assist           Walk 10 feet on uneven surface  activity   Assist Walk 10 feet on uneven surfaces activity did not occur: N/A         Wheelchair     Assist Will patient use wheelchair at discharge?: Yes Type of Wheelchair: Power    Wheelchair assist level: Supervision/Verbal cueing Max wheelchair distance: 300    Wheelchair 50 feet with 2 turns activity    Assist        Assist Level: Supervision/Verbal cueing   Wheelchair 150 feet activity     Assist      Assist Level: Supervision/Verbal cueing   Blood pressure (!) 117/54, pulse (!) 101, temperature 99.2 F (37.3 C), resp. rate 19, height 3' 6.91" (1.09 m), weight 80.4 kg, SpO2 97 %.  Medical Problem List and Plan: 1.  Deficits with mobility, transfers, endurance, self-care secondary to wound dehiscence status post bilateral AKA.             Patient may shower with incisions covered             Goals/ELOS: Min/mod a at wheelchair level/7-10+ days            -4/1 team to discuss dc date today  4/2-continue PT and OT; discussed at length that it is very difficult for anyone over the age of 49 to be able to walk longer distances with bilateral AKA's.  I went over the energy expenditure required, as well as strongly suggested that they focus their attention on the wheelchair and possibly in the future get transfer prosthetics. 2.  DVT Prophylaxis/Anticoagulation, hx of DVT (8/21):     - Given the documentation I can find from cardiology, her bleeding risk, we have stopped eliquis and resumed prophylactic lovenox 40mg  QD         Antiplatelets: Clopidogrel 75, ASA 81 3. Pain Management: Oxycodone as needed             Robaxin 500 qid scheduled             Cymbalta 60  daily  -have reviewed importance of edema control, massage/desensitization             3/29  added oxycontin 10mg  q12  4/1 still having a  lot of pain. Not using prn oxy consistently. Will try oxycontin 20mg  q12 watching closely for tolerance/neurosedation   4/2-patient is rating her pain as a 5-6 out of 10-has not taken pain medicine for more than 6 hours; suggested she ask for some; continue regimen 4. Mood/anxiety/depression: Team to provide ongoing egosupport             Cymbalta 60 mg daily              -appreciate neuropsych input    -improve pain control as above 5. Neuropsych: This patient is capable of making decisions on her own behalf. 6. Skin/Wound Care: dry dressings daily to AKA incisions  - stump socks for dressing retention  -foam dressing to sacrum  -air mattress for comfort/pressure relief 7. Fluids/Electrolytes/Nutrition: intake inconsistent  -pt understands importance of nutrition             labs reviewed. Mild elevation of AST--likely reactive--recheck next week 8.  Vitamin D deficiency             Vitamin D 1000 units daily 9.  Slow transit constipation---intake starting to pick up             colace, miralax  -results with sorbitol  -patient trying to push po               10.  Type 1 diabetes mellitus type II with hyperglycemia             SSI             Patient has insulin pump. This was not resumed. She sent it home   CBG (last 3)  Recent Labs    06/13/20 2110 06/14/20 0605 06/14/20 1131  GLUCAP 150* 78 238*     -3/31 titrated lantus to 12u bid---improving control  -4/1 increase lantus to 14u bid  4/2- BGs are somewhat labile; however just increase Lantus yesterday-we will monitor 11.  PVD             See #2  12.  Essential hypertension             Lopressor 12.5 twice daily, Bumex 2 mg daily  -controlled 3/29 13.  Hyperlipidemia             Zestril 40 mg daily 14.  Acute on chronic renal failure:  -BUN improved 3/30  -K+ still sl elevated.  Continue to hold  ACE for now  -recheck Monday 15.  CAD             See #2 16.  RA             Prednisone 12.5 mg daily, 2.5 mg nightly 17.  Leukocytosis             Afebrile             wbc's falling--10.9 3/31 18.  Acute blood loss anemia             Hemoglobin holding at 7.5 3/31             recheck Monday    I spent 35 minutes on total care for this patient today; greater than 50% was spent on coordination of care.  I spent 30 minutes in the room talking to the patient and her husband about power wheelchairs and prosthetics.  She voiced understanding at the end of the interview, and appeared Calmer.    LOS: 6 days A FACE TO FACE EVALUATION WAS PERFORMED  Coree Riester 06/14/2020, 4:15 PM

## 2020-06-14 NOTE — Plan of Care (Signed)
  Problem: RH Bed to Chair Transfers Goal: LTG Patient will perform bed/chair transfers w/assist (PT) Description: LTG: Patient will perform bed to chair transfers with assistance (PT). Flowsheets (Taken 06/14/2020 [phone]) LTG: Pt will perform Bed to Chair Transfers with assistance level: (Upgraded goal due to patient's progress with mobility and for reduced caregiver burden at d/c.) Contact Guard/Touching assist Note: Upgraded goal due to patient's progress with mobility and for reduced caregiver burden at d/c.   Problem: RH Furniture Transfers Goal: LTG Patient will perform furniture transfers w/assist (OT/PT) Description: LTG: Patient will perform furniture transfers  with assistance (OT/PT). Flowsheets (Taken 06/14/2020 [phone]) LTG: Pt will perform furniture transfers with assist:: (Upgraded goal due to patient's progress with mobility and for reduced caregiver burden at d/c.) Contact Guard/Touching assist Note: Upgraded goal due to patient's progress with mobility and for reduced caregiver burden at d/c.   Problem: RH Wheelchair Mobility Goal: LTG Patient will propel w/c in controlled environment (PT) Description: LTG: Patient will propel wheelchair in controlled environment, # of feet with assist (PT) Flowsheets Taken 06/14/2020 0812 by Serina Cowper L, PT LTG: Propel w/c distance in controlled environment: 500 ft using power mobility Taken 06/09/2020 1600 by Lucio Edward, PT LTG: Pt will propel w/c in controlled environ  assist needed:: Independent with assistive device Goal: LTG Patient will propel w/c in home environment (PT) Description: LTG: Patient will propel wheelchair in home environment, # of feet with assistance (PT). Flowsheets Taken 06/14/2020 7989 by Serina Cowper L, PT LTG: Propel w/c distance in home environment: 50 feet with tight turns using power mobility Taken 06/09/2020 1600 by Lucio Edward, PT LTG: Pt will propel w/c in home environ  assist needed::  Independent with assistive device

## 2020-06-15 DIAGNOSIS — E1065 Type 1 diabetes mellitus with hyperglycemia: Secondary | ICD-10-CM

## 2020-06-15 DIAGNOSIS — E108 Type 1 diabetes mellitus with unspecified complications: Secondary | ICD-10-CM

## 2020-06-15 LAB — COMPREHENSIVE METABOLIC PANEL
ALT: 26 U/L (ref 0–44)
AST: 17 U/L (ref 15–41)
Albumin: 1.8 g/dL — ABNORMAL LOW (ref 3.5–5.0)
Alkaline Phosphatase: 73 U/L (ref 38–126)
Anion gap: 7 (ref 5–15)
BUN: 26 mg/dL — ABNORMAL HIGH (ref 8–23)
CO2: 25 mmol/L (ref 22–32)
Calcium: 8.7 mg/dL — ABNORMAL LOW (ref 8.9–10.3)
Chloride: 107 mmol/L (ref 98–111)
Creatinine, Ser: 1.03 mg/dL — ABNORMAL HIGH (ref 0.44–1.00)
GFR, Estimated: 60 mL/min — ABNORMAL LOW (ref >60)
Glucose, Bld: 132 mg/dL — ABNORMAL HIGH (ref 70–99)
Potassium: 4.8 mmol/L (ref 3.5–5.1)
Sodium: 139 mmol/L (ref 135–145)
Total Bilirubin: 0.6 mg/dL (ref 0.3–1.2)
Total Protein: 5.3 g/dL — ABNORMAL LOW (ref 6.5–8.1)

## 2020-06-15 LAB — GLUCOSE, CAPILLARY
Glucose-Capillary: 136 mg/dL — ABNORMAL HIGH (ref 70–99)
Glucose-Capillary: 126 mg/dL — ABNORMAL HIGH (ref 70–99)
Glucose-Capillary: 98 mg/dL (ref 70–99)
Glucose-Capillary: 88 mg/dL (ref 70–99)
Glucose-Capillary: 129 mg/dL — ABNORMAL HIGH (ref 70–99)

## 2020-06-15 NOTE — Progress Notes (Signed)
Physical Therapy Session Note  Patient Details  Name: Kaitlin Hudson MRN: [id number] Date of Birth: 09-27-1955  Today's Date: 06/15/2020 PT Individual Time: 1300-1435 PT Individual Time Calculation (min): 95 min   Short Term Goals: Week 1:  PT Short Term Goal 1 (Week 1): Pt will roll side to side using side rails w/ min A PT Short Term Goal 2 (Week 1): Pt will transfers sup to long sit w/ mod A PT Short Term Goal 3 (Week 1): Pt will transfer bed<> power chair w/ mod A, anterior-posterior or SB  Skilled Therapeutic Interventions/Progress Updates:     Patient in bed with her husband in the room upon PT arrival. Patient alert and agreeable to PT session. Patient reported 4-5/10 B residual limb pain during session, RN made aware. PT provided repositioning, rest breaks, and distraction as pain interventions throughout session. Reports pain improved today after having a "bad pain" day yesterday.   Requesting B limb dressings changed. Reports incorrect dressings were placed yesterday. Checked chart for orders and wrote orders on the white board in the patient's room, RN made aware. Per dressing orders, applied oil emersion dressing with 2-4 4x4s for drainage control, and Kerlix with stump sock donned to secure dressing during mobility. Patient's husband applied stump socks, following dressing changes by PT, with min cues for technique.   Therapeutic Activity: Bed Mobility: Patient performed supine to/from sit with supervision-mod I x3 with use of 1-2 bed rails. Provided verbal cues for use of elbows to reduce bed rail use, difficult in air mattress. Discussed hospital bed versus regular bed at home. Educated on gel overlay for improved pressure relief, hospital bed features for sitting up in the bed and positional changes, and reduced effort with bed mobility with use of bed rails. Patient and her husband plan to discuss, state there is a spare bedroom the hospital bed could go in. Plan to attempt mobility  in ADL bed tomorrow during therapy to simulate home set-up.  Transfers: Patient performed a slide board transfer bed<>power w/c with min A-CGA and total A for board placement. Patient's husband provided SBA from behind patient for safety during transfer. Provided cues for hand placement, board placement, and head-hips relationship for proper technique and decreased assist with transfers. Measured and discussed home set-up in regular bed to allow for downhill SBT in and out of bed. Plan to add box spring back in if regular bed used at d/c.   Wheelchair Mobility:  Patient propelled FWD power wheelchair >150 feet x2 with supervision for turning and mod I for steering in open hallways. Patient trespassed through 2 narrow doorways, went up/down a ramp, and weaved between 5 cones for improved steering with FWD and simulation of home environment. Patient required min cues for steering and did not hit any obstacles during activities.   Patient intermitently emotional and tearful during session and with increased anxiety in power chair and when discussing d/c home. Provided therapeutic listening and emotional support throughout session. Educated on improvement in pain and functional mobility and discussed PT goals for this week to prepare for d/c home at the end of the week.   Patient in bed with her husband and RN at end of session with breaks locked, bed alarm set, and all needs within reach.    Therapy Documentation Precautions:  Precautions Precautions: Fall Precaution Comments: dressings difficult to maintain, shrinkers to be applied. Restrictions Weight Bearing Restrictions: Yes RLE Weight Bearing: Non weight bearing LLE Weight Bearing: Non weight bearing  Therapy/Group: Individual Therapy  Jackquline Branca L Solomiya Pascale PT, DPT  06/15/2020, 4:45 PM

## 2020-06-15 NOTE — Progress Notes (Signed)
PROGRESS NOTE   Subjective/Complaints:   Patient reports that her pain is a 4/10-which is better because she is keeping better control of taking her pain medications-she is keeping up with them.  She admits she is a little depressed because she wanted to be further along by now Her last bowel movement was 3 days ago-she says this is normal for her; she denies constipation.   ROS:   Pt denies SOB, abd pain, CP, N/V/C/D, and vision changes  Objective:   No results found. No results for input(s): WBC, HGB, HCT, PLT in the last 72 hours. Recent Labs    06/15/20 0440  NA 139  K 4.8  CL 107  CO2 25  GLUCOSE 132*  BUN 26*  CREATININE 1.03*  CALCIUM 8.7*    Intake/Output Summary (Last 24 hours) at 06/15/2020 1445 Last data filed at 06/14/2020 1928 Gross per 24 hour  Intake 120 ml  Output --  Net 120 ml        Physical Exam: Vital Signs Blood pressure (!) 110/47, pulse 93, temperature 98.6 F (37 C), temperature source Oral, resp. rate 17, height 3' 6.91" (1.09 m), weight 80.4 kg, SpO2 98 %.    General: awake, alert, appropriate, patient sitting up in bed with husband at bedside;  NAD HENT: conjugate gaze; oropharynx moist CV: regular rate; no JVD Pulmonary: CTA B/L; no W/R/R- good air movement GI: soft, NT, ND, (+)BS-slightly hypoactive Psychiatric: appropriate-less anxious Neurological: Ox3 Skin: small sacral skin tears, left AKA incision still with substantial seros-sang drainage. Left AKA with only slight drainage-dressings clean dry and intact. She also has a large skin tear on the right forearm-that is draining slightly.  And is covered with Kerlix (she reports it is due to Tegaderm pulling her skin ).   Neuro: Ox3 Cranial nerves 2-12 are intact. Sensory exam is normal. Reflexes are 2+ in all 4's. Fine motor coordination is intact. No tremors. Motor function is grossly 5/5 UE and 3- to 3/5 LE  R>L Musculoskeletal: left stump more tender than left. Both still edematous. Wrapped- no drainage seen     Assessment/Plan: 1. Functional deficits which require 3+ hours per day of interdisciplinary therapy in a comprehensive inpatient rehab setting.  Physiatrist is providing close team supervision and 24 hour management of active medical problems listed below.  Physiatrist and rehab team continue to assess barriers to discharge/monitor patient progress toward functional and medical goals  Care Tool:  Bathing    Body parts bathed by patient: Right arm,Left arm,Chest,Abdomen,Front perineal area,Right upper leg,Left upper leg,Face   Body parts bathed by helper: Buttocks,Right upper leg,Left upper leg Body parts n/a: Right lower leg,Left lower leg   Bathing assist Assist Level: Moderate Assistance - Patient 50 - 74%     Upper Body Dressing/Undressing Upper body dressing   What is the patient wearing?: Pull over shirt    Upper body assist Assist Level: Minimal Assistance - Patient > 75%    Lower Body Dressing/Undressing Lower body dressing      What is the patient wearing?: Pants,Incontinence brief     Lower body assist Assist for lower body dressing: Maximal Assistance - Patient 25 - 49%  Toileting Toileting    Toileting assist Assist for toileting: Total Assistance - Patient < 25%     Transfers Chair/bed transfer  Transfers assist     Chair/bed transfer assist level: Moderate Assistance - Patient 50 - 74% Chair/bed transfer assistive device: Sliding board   Locomotion Ambulation   Ambulation assist   Ambulation activity did not occur: N/A          Walk 10 feet activity   Assist           Walk 50 feet activity   Assist           Walk 150 feet activity   Assist           Walk 10 feet on uneven surface  activity   Assist Walk 10 feet on uneven surfaces activity did not occur: N/A         Wheelchair     Assist  Will patient use wheelchair at discharge?: Yes Type of Wheelchair: Power    Wheelchair assist level: Supervision/Verbal cueing Max wheelchair distance: 300    Wheelchair 50 feet with 2 turns activity    Assist        Assist Level: Supervision/Verbal cueing   Wheelchair 150 feet activity     Assist      Assist Level: Supervision/Verbal cueing   Blood pressure (!) 110/47, pulse 93, temperature 98.6 F (37 C), temperature source Oral, resp. rate 17, height 3' 6.91" (1.09 m), weight 80.4 kg, SpO2 98 %.  Medical Problem List and Plan: 1.  Deficits with mobility, transfers, endurance, self-care secondary to wound dehiscence status post bilateral AKA.             Patient may shower with incisions covered             Goals/ELOS: Min/mod a at wheelchair level/7-10+ days            -4/1 team to discuss dc date today  4/3-continue PT and OT 2.  DVT Prophylaxis/Anticoagulation, hx of DVT (8/21):     - Given the documentation I can find from cardiology, her bleeding risk, we have stopped eliquis and resumed prophylactic lovenox  QD         Antiplatelets: Clopidogrel 75, ASA 81 3. Pain Management: Oxycodone as needed             Robaxin 500 qid scheduled             Cymbalta 60 daily  -have reviewed importance of edema control, massage/desensitization             3/29  added oxycontin  q12  4/1 still having a lot of pain. Not using prn oxy consistently. Will try oxycontin  q12 watching closely for tolerance/neurosedation   4/2-patient is rating her pain as a 5-6 out of 10-has not taken pain medicine for more than 6 hours; suggested she ask for some; continue regimen  4/3-patient admits her pain is about a 4/10- today-due to taking pain medicine more regularly-continue regimen 4. Mood/anxiety/depression: Team to provide ongoing egosupport             Cymbalta 60 mg daily              -appreciate neuropsych input    -improve pain control as above 5. Neuropsych: This  patient is capable of making decisions on her own behalf. 6. Skin/Wound Care: dry dressings daily to AKA incisions  - stump socks for dressing retention  -foam dressing to sacrum  -  air mattress for comfort/pressure relief 7. Fluids/Electrolytes/Nutrition: intake inconsistent  -pt understands importance of nutrition             labs reviewed. Mild elevation of AST--likely reactive--recheck next week 8.  Vitamin D deficiency             Vitamin D 1000 units daily 9.  Slow transit constipation---intake starting to pick up             colace, miralax  -results with sorbitol  -patient trying to push po  4/3-patient reports last bowel movement was 3 days ago-she notes this is normal for her; and denies constipation-if no results by tomorrow suggest sorbitol.               10.  Type 1 diabetes mellitus type II with hyperglycemia             SSI             Patient has insulin pump. This was not resumed. She sent it home   CBG (last 3)  Recent Labs    06/14/20 2101 06/15/20 0616 06/15/20 1153  GLUCAP 231* 136* 126*     -3/31 titrated lantus to 12u bid---improving control  -4/1 increase lantus to 14u bid  4/2- BGs are somewhat labile; however just increase Lantus yesterday-we will monitor  4/3- BGs getting better- con't regimen for now 11.  PVD             See #2  12.  Essential hypertension             Lopressor 12.5 twice daily, Bumex 2 mg daily  -controlled 3/29  4/3- BP controlled- con't regimen 13.  Hyperlipidemia             Zestril 40 mg daily 14.  Acute on chronic renal failure:  -BUN improved 3/30  -K+ still sl elevated. Continue to hold  ACE for now  -recheck Monday 15.  CAD             See #2 16.  RA             Prednisone 12.5 mg daily, 2.5 mg nightly 17.  Leukocytosis             Afebrile             wbc's falling--10.9 3/31 18.  Acute blood loss anemia             Hemoglobin holding at 7.5 3/31             4/3- CBC in AM     LOS: 7 days A FACE TO FACE  EVALUATION WAS PERFORMED  Kyuss Hale 06/15/2020, 2:45 PM

## 2020-06-16 LAB — CBC
HCT: 25.9 % — ABNORMAL LOW (ref 36.0–46.0)
Hemoglobin: 8 g/dL — ABNORMAL LOW (ref 12.0–15.0)
MCH: 26.2 pg (ref 26.0–34.0)
MCHC: 30.9 g/dL (ref 30.0–36.0)
MCV: 84.9 fL (ref 80.0–100.0)
Platelets: 411 10*3/uL — ABNORMAL HIGH (ref 150–400)
RBC: 3.05 MIL/uL — ABNORMAL LOW (ref 3.87–5.11)
RDW: 19.9 % — ABNORMAL HIGH (ref 11.5–15.5)
WBC: 12.8 10*3/uL — ABNORMAL HIGH (ref 4.0–10.5)
nRBC: 0.2 % (ref 0.0–0.2)

## 2020-06-16 LAB — GLUCOSE, CAPILLARY
Glucose-Capillary: 162 mg/dL — ABNORMAL HIGH (ref 70–99)
Glucose-Capillary: 120 mg/dL — ABNORMAL HIGH (ref 70–99)
Glucose-Capillary: 86 mg/dL (ref 70–99)
Glucose-Capillary: 143 mg/dL — ABNORMAL HIGH (ref 70–99)

## 2020-06-16 MED ORDER — SORBITOL 70 % SOLN
60.0000 mL | Status: AC
  Administered 2020-06-16: 60 mL via ORAL
  Filled 2020-06-16: qty 60

## 2020-06-16 NOTE — Progress Notes (Signed)
Physical Therapy Session Note  Patient Details  Name: Kaitlin Hudson Name MRN: [id number] Date of Birth: 11/24/1955  Today's Date: 06/16/2020 PT Individual Time: 0800-0930 PT Individual Time Calculation (min): 90 min   Short Term Goals: Week 1:  PT Short Term Goal 1 (Week 1): Pt will roll side to side using side rails w/ min A PT Short Term Goal 2 (Week 1): Pt will transfers sup to long sit w/ mod A PT Short Term Goal 3 (Week 1): Pt will transfer bed<> power chair w/ mod A, anterior-posterior or SB  Skilled Therapeutic Interventions/Progress Updates:     Patient sitting EOB with her husband in the room upon PT arrival. Patient alert and agreeable to PT session. Patient reported 5-6/10 residual limb pain during session, RN made aware and provided pain meds during session. PT provided repositioning, rest breaks, and distraction as pain interventions throughout session. MD rounded during session, discussed wound heeling, L residual limb drainage, see below, patient with infrequent voiding, and pain management with patient and MD.    B limb dressings changed following transfer to power w/c due to increased L residual limb serosanguinous drainage. Per dressing orders, applied oil emersion dressing with 2-4 4x4s for drainage control, and Kerlix with stump sock donned to secure dressing during mobility. Applied stump socks with total A. Placed w/c in partial tilt and recline position for ease of dressing changes.   Therapeutic Activity: Bed Mobility: Patient performed supine to/from sit with supervision in a flat bed with use of L bed rail only to simulate home set-up with CGA-min A for trunk support and balance. Provided verbal cues for using both hand on hand rail to initiate then placing hands behind her hips to perform scooting. Transfers: Patient performed a slide board transfer bed>power w/c with min A-CGA and total A for board placement. Patient's husband provided SBA from behind patient for safety  during transfer. Provided cues for hand placement, board placement, and head-hips relationship for proper technique and decreased assist with transfers. Patient with decreased safety awareness at end of transfer, asking for the board to be removed before it was safe to do so, required min A only for trunk support to initiate final scoot into the chair before board removal, otherwise patient performed transfer with CGA. Discussed home set-up in regular bed to allow for downhill SBT in and out of bed. Patient's husband added box spring back in for regular bed as discussed. Reports bed height is now 28" for downhill transfers to/from the bed using power seat elevator.    Wheelchair Mobility:  Performed power w/c mobility in the room focused on household mobility. Patient able to steer in narrow spaced and back chair up next to bed and the closet and sink. Patient unable to reach clothing in the closet or the sink with chair at lowest setting. Utilized Tour manager and patient able to grab clothing in the closet and reach the sink to turn it on with min cues for w/c set-up. Patient performed power tilt for pressure relief x1 independently and educated on use of power recline for improved comfort and positioning.   Patient in power w/c for improved sitting tolerance at end of session with breaks locked, seat belt secured, and all needs within reach. Roho continues to have 1.5-2" of space between patient's ITs and the bottom of the seat at end of session.    Therapy Documentation Precautions:  Precautions Precautions: Fall Precaution Comments: dressings difficult to maintain, shrinkers to be applied. Restrictions Weight  Bearing Restrictions: Yes RLE Weight Bearing: Non weight bearing LLE Weight Bearing: Non weight bearing   Therapy/Group: Individual Therapy  [name] [name] PT, DPT  06/16/2020, 12:33 PM

## 2020-06-16 NOTE — Progress Notes (Signed)
Occupational Therapy Weekly Progress Note  Patient Details  Name: Kaitlin Hudson MRN: [id number] Date of Birth: [birth year]  Beginning of progress report period: June 09, 2020 End of progress report period: June 16, 2020  Today's Date: 06/16/2020 OT Individual Time: 1400-1500 OT Individual Time Calculation (min): 60 min    Patient has met 3 of 4 short term goals. Pt has made steady progress toward her OT goals. She is able to complete ADLs at a min A level overall at bed level. She is progressing in transfers and it able to use a slideboard at min A-CGA level consistently. Her husband [name] is present daily and very involved in her care.   Patient continues to demonstrate the following deficits: muscle weakness, decreased safety awareness and delayed processing and decreased sitting balance, decreased postural control and decreased balance strategies and therefore will continue to benefit from skilled OT intervention to enhance overall performance with BADL and Reduce care partner burden.  Patient progressing toward long term goals..  Continue plan of care.  OT Short Term Goals Week 1:  OT Short Term Goal 1 (Week 1): patient will complete rolling in bed with CS, supine to sitting with mod A OT Short Term Goal 1 - Progress (Week 1): Met OT Short Term Goal 2 (Week 1): patient will increase anterior/posterior transfer to/from commode, bed, and w/c with mod A of one OT Short Term Goal 2 - Progress (Week 1): Met OT Short Term Goal 3 (Week 1): patient will complete adl tasks with set up/min A OT Short Term Goal 3 - Progress (Week 1): Progressing toward goal OT Short Term Goal 4 (Week 1): patient will complete toileting commode level with max A of one OT Short Term Goal 4 - Progress (Week 1): Met Week 2:  OT Short Term Goal 1 (Week 2): STG=LTG d/t ELOS  Skilled Therapeutic Interventions/Progress Updates:    Pt received sitting in power w/c. Pt requesting to try toileting with bariatric BSC. Pt was  able to drive w/c into bathroom and position it appropriately with mod cueing from OT and husband. Pt completed lateral scoot transfer with CGA. Min A to doff incontinence brief only, pt able to doff shorts with CGA for sitting balance. Pt required extended time to void BM. She completed hygiene and then had more BM void- this occurred several times. Pt also began vomiting- RN alerted. Pt finished hygiene and OT assisted with donning incontinence brief. Pt requested to not put on shorts. Pt completed lateral scoot transfer back to w/c. She had significant anxiety and near panic attack transferring back with hyperventilation and crying, convinced she was falling. Pt was in fact able to do transfer with CGA and frequent cueing for encouragement. Pt was left sitting up with all needs met, seatbelt on.   Therapy Documentation Precautions:  Precautions Precautions: Fall Precaution Comments: dressings difficult to maintain, shrinkers to be applied. Restrictions Weight Bearing Restrictions: Yes RLE Weight Bearing: Non weight bearing LLE Weight Bearing: Non weight bearing   Therapy/Group: Individual Therapy  Curtis Sites 06/16/2020, 12:49 PM

## 2020-06-16 NOTE — Progress Notes (Signed)
Occupational Therapy Session Note  Patient Details  Name: Kaitlin Hudson MRN: [id number] Date of Birth: [birth year]  Today's Date: 06/16/2020 OT Individual Time: 6301-6010 OT Individual Time Calculation (min): 70 min    Short Term Goals: Week 1:  OT Short Term Goal 1 (Week 1): patient will complete rolling in bed with CS, supine to sitting with mod A OT Short Term Goal 2 (Week 1): patient will increase anterior/posterior transfer to/from commode, bed, and w/c with mod A of one OT Short Term Goal 3 (Week 1): patient will complete adl tasks with set up/min A OT Short Term Goal 4 (Week 1): patient will complete toileting commode level with max A of one  Skilled Therapeutic Interventions/Progress Updates:    Pt received sitting up in the power w/c. Focus of session on home accessibility, problem solving through shower and BSC transfers, and power mobility within the home. Both pt and her husband were very engaged in session with active group problem solving, with OT providing suggestions for positioning, equipment use, and maximizing safety/fall risk. Both reported they have been offered an Building surveyor. Expressed concerns re husband Wayne's back pain and pushing of the hoyer. Brought in a manual hoyer and allowed [place] to practice pushing with OT in sling- he confirmed this was painful and straining on his back so both agreed hoyer in the home was not practical. Pt completed hair washing at the sink with mod A overall. Also worked on power w/c positioning and turning within small spaces to simulate home bathroom. Pt was left sitting up in the power w/c with her husband present.   Therapy Documentation Precautions:  Precautions Precautions: Fall Precaution Comments: dressings difficult to maintain, shrinkers to be applied. Restrictions Weight Bearing Restrictions: Yes RLE Weight Bearing: Non weight bearing LLE Weight Bearing: Non weight bearing  Therapy/Group: Individual Therapy  Crissie Reese 06/16/2020, 6:22 AM

## 2020-06-16 NOTE — Progress Notes (Signed)
   06/16/20 0841  Assess: MEWS Score  BP 100/70  Pulse Rate (!) 130  SpO2 100 %  Assess: MEWS Score  MEWS Temp 0  MEWS Systolic 1  MEWS Pulse 3  MEWS RR 0  MEWS LOC 0  MEWS Score 4  MEWS Score Color Red  Assess: if the MEWS score is Yellow or Red  Were vital signs taken at a resting state? No  Focused Assessment No change from prior assessment  Early Detection of Sepsis Score *See Row Information* Low  MEWS guidelines implemented *See Row Information* Yes  Treat  MEWS Interventions Other (Comment) (Pt stable no intervention)  Pain Scale 0-10  Pain Score 0  Take Vital Signs  Increase Vital Sign Frequency  Red: Q 1hr X 4 then Q 4hr X 4, if remains red, continue Q 4hrs  Escalate  MEWS: Escalate Red: discuss with charge nurse/RN and provider, consider discussing with RRT  Notify: Charge Nurse/RN  Name of Charge Nurse/RN Notified Dauda  Date Charge Nurse/RN Notified 06/16/20  Time Charge Nurse/RN Notified 1055  Notify: Provider  Provider Name/Title Faith Rogue  Date Provider Notified 06/16/20  Time Provider Notified 1059  Notification Type Call  Notification Reason Other (Comment) (Per protocol)  Provider response Other (Comment) (Just monitor patient)  Date of Provider Response 06/16/20  Time of Provider Response 1100  Document  Patient Outcome Stabilized after interventions  Progress note created (see row info) Yes   Vitals taken by mistake; patient stable; unsure who took the vital signs; was not reported to RN.

## 2020-06-16 NOTE — Progress Notes (Addendum)
PROGRESS NOTE   Subjective/Complaints: Overall pain is improved. Left incision still draining. Feels sleepy in the morning from pain meds but very alert otherwise  ROS: Patient denies fever, rash, sore throat, blurred vision, nausea, vomiting, diarrhea, cough, shortness of breath or chest pain,  headache, or mood change.      Objective:   No results found. Recent Labs    06/16/20 0712  WBC 12.8*  HGB 8.0*  HCT 25.9*  PLT 411*   Recent Labs    06/15/20 0440  NA 139  K 4.8  CL 107  CO2 25  GLUCOSE 132*  BUN 26*  CREATININE 1.03*  CALCIUM 8.7*    Intake/Output Summary (Last 24 hours) at 06/16/2020 1042 Last data filed at 06/15/2020 1510 Gross per 24 hour  Intake 177 ml  Output 487 ml  Net -310 ml        Physical Exam: Vital Signs Blood pressure 100/70, pulse (!) 130, temperature 97.6 F (36.4 C), temperature source Oral, resp. rate 19, height 3' 6.91" (1.09 m), weight 80.4 kg, SpO2 100 %.    Constitutional: No distress . Vital signs reviewed. obese HEENT: EOMI, oral membranes moist Neck: supple Cardiovascular: RRR without murmur. No JVD    Respiratory/Chest: CTA Bilaterally without wheezes or rales. Normal effort    GI/Abdomen: BS +, non-tender, non-distended Ext: no clubbing, cyanosis, or edema Psych: pleasant and cooperative Neurological: Ox3 Skin: small sacral skin tears, left AKA incision with serosanguinous drainage. Left AKA with only slight drainage-dressings clean dry and intact. Right forearm with kerlix over tear   Neuro: alert and oriented. Cranial nerves 2-12 are intact. Sensory exam is normal. Reflexes are 2+ in all 4's. Fine motor coordination is intact. No tremors. Motor function is grossly 5/5 UE and 3- to 3/5 LE R>L d/t pain, leverage Musculoskeletal: left stump more tender than right     Assessment/Plan: 1. Functional deficits which require 3+ hours per day of interdisciplinary  therapy in a comprehensive inpatient rehab setting.  Physiatrist is providing close team supervision and 24 hour management of active medical problems listed below.  Physiatrist and rehab team continue to assess barriers to discharge/monitor patient progress toward functional and medical goals  Care Tool:  Bathing    Body parts bathed by patient: Right arm,Left arm,Chest,Abdomen,Front perineal area,Right upper leg,Left upper leg,Face   Body parts bathed by helper: Buttocks,Right upper leg,Left upper leg Body parts n/a: Right lower leg,Left lower leg   Bathing assist Assist Level: Moderate Assistance - Patient 50 - 74%     Upper Body Dressing/Undressing Upper body dressing   What is the patient wearing?: Pull over shirt    Upper body assist Assist Level: Minimal Assistance - Patient > 75%    Lower Body Dressing/Undressing Lower body dressing      What is the patient wearing?: Pants,Incontinence brief     Lower body assist Assist for lower body dressing: Maximal Assistance - Patient 25 - 49%     Toileting Toileting    Toileting assist Assist for toileting: Total Assistance - Patient < 25%     Transfers Chair/bed transfer  Transfers assist     Chair/bed transfer assist level: Minimal  Assistance - Patient > 75% Chair/bed transfer assistive device: Sliding board   Locomotion Ambulation   Ambulation assist   Ambulation activity did not occur: N/A          Walk 10 feet activity   Assist           Walk 50 feet activity   Assist           Walk 150 feet activity   Assist           Walk 10 feet on uneven surface  activity   Assist Walk 10 feet on uneven surfaces activity did not occur: N/A         Wheelchair     Assist Will patient use wheelchair at discharge?: Yes Type of Wheelchair: Power    Wheelchair assist level: Supervision/Verbal cueing,Independent Max wheelchair distance: >150 ft    Wheelchair 50 feet with 2  turns activity    Assist        Assist Level: Independent   Wheelchair 150 feet activity     Assist      Assist Level: Supervision/Verbal cueing,Independent   Blood pressure 100/70, pulse (!) 130, temperature 97.6 F (36.4 C), temperature source Oral, resp. rate 19, height 3' 6.91" (1.09 m), weight 80.4 kg, SpO2 100 %.  Medical Problem List and Plan: 1.  Deficits with mobility, transfers, endurance, self-care secondary to wound dehiscence status post bilateral AKA.             Patient may shower with incisions covered             Goals/ELOS: Min/mod a at wheelchair level/7-10+ days            -Continue CIR therapies including PT, OT  2.  DVT Prophylaxis/Anticoagulation, hx of DVT (8/21):     - Given the documentation I can find from cardiology, her bleeding risk, we have stopped eliquis and resumed prophylactic lovenox 40mg  QD         Antiplatelets: Clopidogrel 75, ASA 81 3. Pain Management: Oxycodone as needed             Robaxin 500 qid scheduled             Cymbalta 60 daily  -have reviewed importance of edema control, massage/desensitization             3/29  added oxycontin 10mg  q12  4/4 pain levels 4-5/10 with increase of oxycontin to 20mg  q12 and using prn's regularly   -will need to tolerate some of the associated sedation 4. Mood/anxiety/depression: Team to provide ongoing egosupport             Cymbalta 60 mg daily              -appreciate neuropsych input    -pain control helps 5. Neuropsych: This patient is capable of making decisions on her own behalf. 6. Skin/Wound Care: dry dressings daily to AKA incisions, keep dry  - stump socks for dressing retention  -foam dressing to sacrum  -air mattress for comfort/pressure relief 7. Fluids/Electrolytes/Nutrition: intake inconsistent  -pt understands importance of nutrition             labs reviewed from 4/3---improved BUN 8.  Vitamin D deficiency             Vitamin D 1000 units daily 9.  Slow transit  constipation---intake starting to pick up             colace, miralax  -results with sorbitol  4/4 -repeat sorbitol today followed by SSE if needed.               10.  Type 1 diabetes mellitus type II with hyperglycemia             SSI             Patient has insulin pump. This was not resumed. She sent it home   CBG (last 3)  Recent Labs    06/15/20 1634 06/15/20 2103 06/16/20 0605  GLUCAP 88 129* 162*     -3/31 titrated lantus to 12u bid---improving control  -4/1 increase lantus to 14u bid  4/4 cbg's improving---no changes 11.  PVD             See #2  12.  Essential hypertension             Lopressor 12.5 twice daily, Bumex 2 mg daily  -controlled 3/29  4/4- BP controlled- con't regimen 13.  Hyperlipidemia             Zestril 40 mg daily 14.  Acute on chronic renal failure:  -BUN improved 3/30  -K+ 4.8 4/3, continue to hold lisinopril 15.  CAD             See #2 16.  RA             Prednisone 12.5 mg daily, 2.5 mg nightly 17.  Leukocytosis             Afebrile             wbc's stable 4/4 18.  Acute blood loss anemia             Hemoglobin 8.0 4/4     LOS: 8 days A FACE TO FACE EVALUATION WAS PERFORMED  Ranelle Oyster 06/16/2020, 10:42 AM

## 2020-06-17 ENCOUNTER — Ambulatory Visit: Payer: Medicare Other | Admitting: Orthopedic Surgery

## 2020-06-17 LAB — GLUCOSE, CAPILLARY
Glucose-Capillary: 110 mg/dL — ABNORMAL HIGH (ref 70–99)
Glucose-Capillary: 220 mg/dL — ABNORMAL HIGH (ref 70–99)
Glucose-Capillary: 58 mg/dL — ABNORMAL LOW (ref 70–99)
Glucose-Capillary: 93 mg/dL (ref 70–99)
Glucose-Capillary: 214 mg/dL — ABNORMAL HIGH (ref 70–99)

## 2020-06-17 MED ORDER — OXYCODONE HCL ER 10 MG PO T12A
20.0000 mg | EXTENDED_RELEASE_TABLET | Freq: Two times a day (BID) | ORAL | Status: DC
  Administered 2020-06-17 – 2020-06-20 (×6): 20 mg via ORAL
  Filled 2020-06-17 (×6): qty 2

## 2020-06-17 NOTE — Progress Notes (Signed)
Occupational Therapy Session Note  Patient Details  Name: Kaitlin Hudson MRN: [id number] Date of Birth: 07-27-1955  Today's Date: 06/17/2020 OT Individual Time: 5732-2025 OT Individual Time Calculation (min): 31 min    Short Term Goals: Week 2:  OT Short Term Goal 1 (Week 2): STG=LTG d/t ELOS  Skilled Therapeutic Interventions/Progress Updates:    Pt completed supine to sit EOB with supervision.  Her husband setup her power wheelchair next to the bed with pt then completing sliding board transfer to the chair with min assist.  She then drove her wheelchair down to the ortho gym where she completed a second transfer onto the therapy mat with min assist.  Next, had her work on simulated LB dressing and clothing management with use of theraband.  She was able to thread it over her LEs and then pull up over her hips with supervision as well.  Discussed possibilities for toilet transfers at home as well as clothing management.  She reports completing a scooting transfer over to the drop arm commode at home without use of a sliding board and hopes she can continue this.  Will need continued practice on drop arm commode as well as clothing management for gown and underpants in future sessions.  Finished session with transfer back to the power wheelchair with min assist using the sliding board and return to the room.  She was left up with her spouse present and call button in reach.    Therapy Documentation Precautions:  Precautions Precautions: Fall Precaution Comments: dressings difficult to maintain, shrinkers to be applied. Restrictions Weight Bearing Restrictions: Yes RLE Weight Bearing: Non weight bearing LLE Weight Bearing: Non weight bearing  Pain: Pain Assessment Pain Scale: Faces Pain Score: 5  Faces Pain Scale: Hurts a little bit Pain Location: Leg Pain Orientation: Right;Left Pain Descriptors / Indicators: Discomfort Pain Onset: On-going Pain Intervention(s): Repositioned;Emotional  support ADL: See Care Tool Section for some details of mobility and selfcare  Therapy/Group: Individual Therapy  Sherol Sabas OTR/L 06/17/2020, 4:33 PM

## 2020-06-17 NOTE — Progress Notes (Signed)
Patient ID: Kaitlin Hudson, female   DOB: Jun 02, 1955, 65 y.o.   MRN: [id number]  SW met with pt in room to provide updates from team conference, d/c date 4/8, and HHA preference. Prefers [facility] as she has worked with this agency in the past.   SW sent HHPT/PT/aide referral to [facility].  *referral accepted.   Loralee Pacas, MSW, [place] Office: [phone] Cell: [phone] Fax: [phone]

## 2020-06-17 NOTE — Progress Notes (Signed)
Physical Therapy Session Note  Patient Details  Name: Kaitlin Hudson MRN: [id number] Date of Birth: [birth year]  Today's Date: 06/17/2020 PT Individual Time: 1100-1200 PT Individual Time Calculation (min): 60 min   Short Term Goals: Week 1:  PT Short Term Goal 1 (Week 1): Pt will roll side to side using side rails w/ min A PT Short Term Goal 2 (Week 1): Pt will transfers sup to long sit w/ mod A PT Short Term Goal 3 (Week 1): Pt will transfer bed<> power chair w/ mod A, anterior-posterior or SB  Skilled Therapeutic Interventions/Progress Updates:     Patient in power w/c with her husband in the room upon PT arrival. Patient alert and agreeable to PT session. Patient reported 5/10 residual pain during session, RN made aware. PT provided repositioning, rest breaks, and distraction as pain interventions throughout session.   ATP present for first 30 min of session for power w/c evaluation. Discussed and demonstrated patient's use of power w/c. Discussed insurance coverage and recommendations. Recommending mid-wheel drive power chair with electric seat tilt, recline, and elevate for pressure relief, improved sitting tolerance, increased independence and decreased caregiver burden with transfers, and increased independence with ADLs and IADLs. Patient will need a Roho cushion due to current stage II skin breakdown on buttocks and sacrum. ATP obtained patient's body measurements and educated on securing straps for use of w/c accessible van.   Therapeutic Activity: Bed Mobility: Patient performed supine to/from sit with mod I with use of bed rail with bed flat, simulating home set-up.  Transfers: Patient performed a slide board transfer power w/c>bed with CGA and total A for board placement. Provided cues for hand placement, board placement, and head-hips relationship for proper technique and decreased assist with transfers.  Patient performed reciprocal scooting forwards and backwards 2x5 to assess  progress towards A/P transfers. Continues to have increased challenge with lifting hips due to body habitus and limb pain, but able to progress >2 feet in each direction. Educated on benefits of this mobility for increased safety with transfers and progressing to not needing the slide board. Recommend that patient set this as a goal with HHPT due to slow progress in CIR and increasing independence with SBTs.   Wheelchair Mobility:  Patient propelled the power wheelchair in the room with mod I and supervision for set-up for transfer. Provided verbal cues for adjusting power w/c closer to the bed for safety during transfer.   Patient in bed with air mattress inflated at end of session with breaks locked, 4 rails up, and all needs within reach. Instructed patient to assess temperature of the bed between therapy sessions to ensure improved sleep positioning and quality tonight.    Therapy Documentation Precautions:  Precautions Precautions: Fall Precaution Comments: dressings difficult to maintain, shrinkers to be applied. Restrictions Weight Bearing Restrictions: Yes RLE Weight Bearing: Non weight bearing LLE Weight Bearing: Non weight bearing   Therapy/Group: Individual Therapy  Ryelynn Guedea L Lark Langenfeld PT, DPT  06/17/2020, 4:26 PM

## 2020-06-17 NOTE — Progress Notes (Signed)
Spoke with patient regarding wanting to be removed from air mattress bed.  Pt was very anxious and fanning herself with paper stating " this bed is just too hot I can't stand this ".Pt was educated to the benefits of air mattress and preventing wounds on buttocks from getting worse. Pt states, "I am too hot and I am not staying in this bed tonight!" " I want to sit in my wheelchair it has pressure relief when I tip it back" Once again the patient was educated to the factors that can cause skin injury/breakdown. Pt states her wheelchair is what she will sit in and sleep in because her air mattress is too hot and uncomfortable and she will do pressure relief in her wheelchair. Pt is alert and oriented times four therefore patient was put in her wheelchair with the seat belt alarm on and armed. Pt was encouraged to speak to MD in the am for alternative measures/beds that may work for her.

## 2020-06-17 NOTE — Progress Notes (Signed)
Physical Therapy Session Note  Patient Details  Name: Kaitlin Hudson MRN: [id number] Date of Birth: 01/02/1956  Today's Date: 06/17/2020 PT Individual Time: 1751-0258 PT Individual Time Calculation (min): 28 min   Short Term Goals: Week 1:  PT Short Term Goal 1 (Week 1): Pt will roll side to side using side rails w/ min A PT Short Term Goal 2 (Week 1): Pt will transfers sup to long sit w/ mod A PT Short Term Goal 3 (Week 1): Pt will transfer bed<> power chair w/ mod A, anterior-posterior or SB  Skilled Therapeutic Interventions/Progress Updates:    Pt received sitting in power wheelchair (PWC) and reports recent low blood sugar but that after drinking juice it is improving. Pt agreeable to therapy session. PWC mobility 165ft out of room down to main therapy gym with supervision - demonstrates difficulty managing w/c through doorways and around turns therefore decreased speed 1 notch (now 2 yellow notches highlighted on indoor speed). Pt able to properly set-up w/c for R lateral scoot transfer to EOM using transfer board - completed transfer with total assist for board placement and then close supervision/CGA for trunk control during transfer. Sit>supine on mat with close supervision. Educated pt on importance of B hip flexor stretch due to increased time in sitting - performed in supine for ~7minutes - pt inquiring about MD recommendation to elevate B LEs and therapist educated pt on reasoning for this to assist with edema management and discussed trying to balance out time spent in hip flexed position and hip extended at least to neutral in supine to decrease risk of hip flexion contracture. Performed supine R LE hip flexion 2x15 reps and 2x10 reps on L LE with pt demonstrating impaired L hip flexor strength compared to R. Pt reports she has not been eating due to lack of hunger - educated her on importance of nutritional intact for wound healing. Supine>sitting R EOB with min assist as pt not having  bedrail to assist with trunk upright. L lateral scoot EOM>PWC with total assist for transfer board placement and CGA for trunk control due to slight posterior LOB. PWC mobility back to room with supervision and pt demonstrates ability to back up w/c next to bed with only verbal cuing for technique. Pt left seated in PWC with needs in reach, seat belt on, her husband present, and meal tray set-up.   Therapy Documentation Precautions:  Precautions Precautions: Fall Precaution Comments: dressings difficult to maintain, shrinkers to be applied. Restrictions Weight Bearing Restrictions: Yes RLE Weight Bearing: Non weight bearing LLE Weight Bearing: Non weight bearing  Pain: Reports "burning" pain in LEs during exercises from muscle activation and stretching otherwise no complaints of pain - provided rest breaks and repositioning for pain management.   Therapy/Group: Individual Therapy  Ginny Forth , PT, DPT, CSRS  06/17/2020, 6:04 PM

## 2020-06-17 NOTE — Progress Notes (Signed)
PROGRESS NOTE   Subjective/Complaints: Had problems with bed last night. Was very hot. Couldn't sleep because of it. Still with a lot of pain in the AM prior to therapies which makes first therapies hard. Often not getting oxy cr until after 8am  ROS: Patient denies fever, rash, sore throat, blurred vision, nausea, vomiting, diarrhea, cough, shortness of breath or chest pain,   headache, or mood change.      Objective:   No results found. Recent Labs    06/16/20 0712  WBC 12.8*  HGB 8.0*  HCT 25.9*  PLT 411*   Recent Labs    06/15/20 0440  NA 139  K 4.8  CL 107  CO2 25  GLUCOSE 132*  BUN 26*  CREATININE 1.03*  CALCIUM 8.7*    Intake/Output Summary (Last 24 hours) at 06/17/2020 0916 Last data filed at 06/17/2020 0800 Gross per 24 hour  Intake 480 ml  Output --  Net 480 ml     Pressure Injury 06/17/20 Buttocks Left;Medial Stage 2 -  Partial thickness loss of dermis presenting as a shallow open injury with a red, pink wound bed without slough. (Active)  06/17/20 0135  Location: Buttocks  Location Orientation: Left;Medial  Staging: Stage 2 -  Partial thickness loss of dermis presenting as a shallow open injury with a red, pink wound bed without slough.  Wound Description (Comments):   Present on Admission:     Physical Exam: Vital Signs Blood pressure (!) 136/53, pulse (!) 102, temperature 97.8 F (36.6 C), resp. rate 18, height 3' 6.91" (1.09 m), weight 80.4 kg, SpO2 100 %.    Constitutional: No distress . Vital signs reviewed. HEENT: EOMI, oral membranes moist Neck: supple Cardiovascular: RRR without murmur. No JVD    Respiratory/Chest: CTA Bilaterally without wheezes or rales. Normal effort    GI/Abdomen: BS +, non-tender, non-distended Ext: no clubbing, cyanosis, or edema Psych: pleasant. Appears more up beat Neurological: Ox3 Skin: small sacral skin tears, left AKA incision with substantial  serosanguinous drainage. Right AKA with drainage as well but not as profuse. Both incisions look clean. Right forearm with kerlix over tear   Neuro: alert and oriented. Cranial nerves 2-12 are intact. Sensory exam is normal. Reflexes are 2+ in all 4's. Fine motor coordination is intact. No tremors. Motor function is grossly 5/5 UE and 3- to 3/5 LE R>L HF Musculoskeletal: left stump remains more tender than right     Assessment/Plan: 1. Functional deficits which require 3+ hours per day of interdisciplinary therapy in a comprehensive inpatient rehab setting.  Physiatrist is providing close team supervision and 24 hour management of active medical problems listed below.  Physiatrist and rehab team continue to assess barriers to discharge/monitor patient progress toward functional and medical goals  Care Tool:  Bathing    Body parts bathed by patient: Right arm,Left arm,Chest,Abdomen,Front perineal area,Right upper leg,Left upper leg,Face   Body parts bathed by helper: Buttocks,Right upper leg,Left upper leg Body parts n/a: Right lower leg,Left lower leg   Bathing assist Assist Level: Moderate Assistance - Patient 50 - 74%     Upper Body Dressing/Undressing Upper body dressing   What is the patient wearing?:  Pull over shirt    Upper body assist Assist Level: Minimal Assistance - Patient > 75%    Lower Body Dressing/Undressing Lower body dressing      What is the patient wearing?: Pants,Incontinence brief     Lower body assist Assist for lower body dressing: Maximal Assistance - Patient 25 - 49%     Toileting Toileting    Toileting assist Assist for toileting: Total Assistance - Patient < 25%     Transfers Chair/bed transfer  Transfers assist     Chair/bed transfer assist level: Minimal Assistance - Patient > 75% Chair/bed transfer assistive device: Sliding board   Locomotion Ambulation   Ambulation assist   Ambulation activity did not occur: N/A           Walk 10 feet activity   Assist           Walk 50 feet activity   Assist           Walk 150 feet activity   Assist           Walk 10 feet on uneven surface  activity   Assist Walk 10 feet on uneven surfaces activity did not occur: N/A         Wheelchair     Assist Will patient use wheelchair at discharge?: Yes Type of Wheelchair: Power    Wheelchair assist level: Supervision/Verbal cueing,Independent Max wheelchair distance: >150 ft    Wheelchair 50 feet with 2 turns activity    Assist        Assist Level: Independent   Wheelchair 150 feet activity     Assist      Assist Level: Supervision/Verbal cueing,Independent   Blood pressure (!) 136/53, pulse (!) 102, temperature 97.8 F (36.6 C), resp. rate 18, height 3' 6.91" (1.09 m), weight 80.4 kg, SpO2 100 %.  Medical Problem List and Plan: 1.  Deficits with mobility, transfers, endurance, self-care secondary to wound dehiscence status post bilateral AKA.             Patient may shower with incisions covered             Goals/ELOS: 06/20/20,  Min/mod a at wheelchair level             -Interdisciplinary Team Conference today    2.  DVT Prophylaxis/Anticoagulation, hx of DVT (8/21):     - Given the documentation I can find from cardiology, her bleeding risk, we have stopped eliquis and resumed prophylactic lovenox--hold for now d/t bleeding from stumps         Antiplatelets: Clopidogrel 75, ASA 81 3. Pain Management: Oxycodone as needed             Robaxin 500 qid scheduled             Cymbalta 60 daily  -have reviewed importance of edema control, massage/desensitization             3/29  added oxycontin 10mg  q12  4/5 will adjust am oycontin to 0600 to better control pain when she first moves in the morning 4. Mood/anxiety/depression: Team to provide ongoing egosupport             Cymbalta 60 mg daily              -appreciate neuropsych input    -pain control helping to bolster  mood 5. Neuropsych: This patient is capable of making decisions on her own behalf. 6. Skin/Wound Care: continue dressings to incisions, change as needed  to keep dry. Will ask ortho to follow up on these prior to dc Friday  - stump socks for dressing retention  -foam dressing to sacrum  -air mattress for comfort/pressure relief----asking mtc to check on it 7. Fluids/Electrolytes/Nutrition: intake inconsistent  -pt understands importance of nutrition             labs reviewed from 4/3---improved BUN 8.  Vitamin D deficiency             Vitamin D 1000 units daily 9.  Slow transit constipation---intake starting to pick up             colace, miralax  -results with sorbitol  4/4 -had liquidy bm last night               10.  Type 1 diabetes mellitus type II with hyperglycemia             SSI             Patient has insulin pump. This was not resumed. She sent it home   CBG (last 3)  Recent Labs    06/16/20 1634 06/16/20 2058 06/17/20 0529  GLUCAP 86 143* 110*    4/5 cbg's improving---continue lantus 14u bid 11.  PVD             See #2  12.  Essential hypertension             Lopressor 12.5 twice daily, Bumex 2 mg daily  -controlled 3/29  4/4- BP controlled- con't regimen 13.  Hyperlipidemia             Zestril 40 mg daily 14.  Acute on chronic renal failure:  -BUN improved 3/30  -K+ 4.8 4/3, continue to hold lisinopril 15.  CAD             See #2 16.  RA             Prednisone 12.5 mg daily, 2.5 mg nightly 17.  Leukocytosis             Afebrile             wbc's stable 4/4 18.  Acute blood loss anemia             Hemoglobin 8.0 4/4     LOS: 9 days A FACE TO FACE EVALUATION WAS PERFORMED  Ranelle Oyster 06/17/2020, 9:16 AM

## 2020-06-17 NOTE — Patient Care Conference (Signed)
Inpatient RehabilitationTeam Conference and Plan of Care Update Date: 06/17/2020   Time: 9:59 AM    Patient Name: Kaitlin Hudson      Medical Record Number: [id number]  Date of Birth: [birth year] Sex: Female         Room/Bed: 4M08C/4M08C-01 Payor Info: Payor: MEDICARE / Plan: MEDICARE PART A AND B / Product Type: *No Product type* /    Admit Date/Time:  06/08/2020  1:50 PM  Primary Diagnosis:  S/P AKA (above knee amputation) bilateral [facility])  Hospital Problems: Principal Problem:   S/P AKA (above knee amputation) bilateral (HCC) Active Problems:   Generalized anxiety disorder    Expected Discharge Date: Expected Discharge Date: 06/20/20  Team Members Present: Physician leading conference: Dr. Faith Rogue Care Coodinator Present: Cecile Sheerer, LCSWA;Lynnelle Mesmer Marlyne Beards, RN, BSN, CRRN Nurse Present: Kennyth Arnold, RN PT Present: Serina Cowper, PT OT Present: Jake Shark, OT PPS Coordinator present : Fae Pippin, SLP     Current Status/Progress Goal Weekly Team Focus  Bowel/Bladder   Pt is continent x2, LFM 06/16/20  Pt will remain continent  q2h toileting/prn   Swallow/Nutrition/ Hydration             ADL's   CGA for transfers, min A ADLs bed level, CGA-min A toileting. Progressing nicely despite anxiety  Supervision to min A  d/c planning, family edu, pain management, ADLs   Mobility   Min A bed mobility and SBT, supervision-mod I with power w/c  CGA overall, mod I power w/c mobility  Sitting balance, functional mobility, transfer training, power w/c mobility, strength/ROM, activity tolerance, patient/caregiver education.   Communication             Safety/Cognition/ Behavioral Observations            Pain   pt c/o bilateral leg pain. PRNS effective  Pt will be free of pain  assess pain qshift   Skin   Pt bilateral AKA- moderate amount of drainage from incisions w/bruisng. MASD to groin/buttocks, 2 small stage 2 on the left inner buttocks  Pt will be free from  breakdown and further infection        Discharge Planning:  Pt to d/c to home with 24/7 care from husband.   Team Discussion: Working on pain issues, anemia stable, mood better when pain is better. Pain is controlled with medications. Air mattress wasn't working properly last night so she slept in [facility], left incision draining more than right but it is serosanguineous drainage. Still has area on buttocks presenting as a stage 2. Need measurements. Education on diabetes, HTN, skin/wound care and dressing changes, and pain management,  Patient on target to meet rehab goals: Yes, still limited by anxiety but min assist for toileting task. Progressing nicely, contact guard assist for transfers. Min assist bed mobility, stand by assist for transfers. Supervision to mod I with power WC. Contact guard to min assist for slide board transfers.  *See Care Plan and progress notes for long and short-term goals.   Revisions to Treatment Plan:  Still adjusting pain medications.  Teaching Needs: Family education, medication management, pain management, skin/wound care, dressing changes, transfer training, wheelchair mobility, sitting balance training, endurance training, AP transfer training, slide board transfer training, power wheelchair training, diabetes education.  Current Barriers to Discharge: Decreased caregiver support, Medical stability, Home enviroment access/layout, Wound care, Lack of/limited family support, Weight bearing restrictions, Medication compliance and Behavior  Possible Resolutions to Barriers: Continue current medications, provide emotional support.     Medical  Summary Current Status: bilateral AKA, still draining, buttock wounds. pain control issues, especially LLE  Barriers to Discharge: Medical stability   Possible Resolutions to Barriers/Weekly Focus: pain control, skin care, air mattress   Continued Need for Acute Rehabilitation Level of Care: The patient requires daily  medical management by a physician with specialized training in physical medicine and rehabilitation for the following reasons: Direction of a multidisciplinary physical rehabilitation program to maximize functional independence : Yes Medical management of patient stability for increased activity during participation in an intensive rehabilitation regime.: Yes Analysis of laboratory values and/or radiology reports with any subsequent need for medication adjustment and/or medical intervention. : Yes   I attest that I was present, lead the team conference, and concur with the assessment and plan of the team.   Tennis Must 06/17/2020, 1:34 PM

## 2020-06-17 NOTE — Progress Notes (Signed)
Occupational Therapy Session Note  Patient Details  Name: Kaitlin Hudson MRN: [id number] Date of Birth: [birth year]  Today's Date: 06/17/2020 OT Individual Time: 8280-0349 OT Individual Time Calculation (min): 55 min    Short Term Goals: Week 2:  OT Short Term Goal 1 (Week 2): STG=LTG d/t ELOS  Skilled Therapeutic Interventions/Progress Updates:    Pt sitting in power w/c with 8/10 pain in her L residual limb, reporting she has just received pain medication. She reports issues with air mattress last night. Pt requested to use the bathroom. She was able to drive PWC into the bathroom with min cueing for positioning and much improved navigation overall. She required power chair elevation/depression to access the [facility] safely. She completed lateral scoot transfer with min A d/t anxiety increasing halfway through transfer related to fall risk. Pt voided urine and then required max A to don shorts d/t anxiety and pain. Pt completed slideboard transfer to the power w/c with CGA. Pt completed power w/c propulsion to the ADL apt. Edu and cueing provided for kitchen navigation and accessibility. Pt returned to her room and was left sitting up with all needs met, husband present  Power w/c seat elevation is necessary for pt to complete toilet, shower, and bed transfers, as well as for pt to access her kitchen to complete daily IADLs.   Therapy Documentation Precautions:  Precautions Precautions: Fall Precaution Comments: dressings difficult to maintain, shrinkers to be applied. Restrictions Weight Bearing Restrictions: Yes RLE Weight Bearing: Non weight bearing LLE Weight Bearing: Non weight bearing  Therapy/Group: Individual Therapy  Curtis Sites 06/17/2020, 6:24 AM

## 2020-06-17 NOTE — Progress Notes (Signed)
Pt was assisted to the electric wheelchair. Pt was very anxious and appeared agitated. Once in the wheelchair, pt was buckled in with safety belt, chair positioned in place requested by the pt. Wheelchair powered off,  Side table w/pt belongings, call light and phone placed next to the pt. Chair with additional blankets and pillows (per request) placed on the other side. Pt resting in wheelchair at this time.

## 2020-06-18 LAB — GLUCOSE, CAPILLARY
Glucose-Capillary: 174 mg/dL — ABNORMAL HIGH (ref 70–99)
Glucose-Capillary: 115 mg/dL — ABNORMAL HIGH (ref 70–99)
Glucose-Capillary: 33 mg/dL — CL (ref 70–99)
Glucose-Capillary: 51 mg/dL — ABNORMAL LOW (ref 70–99)
Glucose-Capillary: 153 mg/dL — ABNORMAL HIGH (ref 70–99)
Glucose-Capillary: 120 mg/dL — ABNORMAL HIGH (ref 70–99)

## 2020-06-18 NOTE — Progress Notes (Signed)
Inpatient Rehabilitation Care Coordinator Discharge Note  The overall goal for the admission was met for:   Discharge location: Yes. D/c to home with 24/7 care.   Length of Stay: Yes. 11 days.   Discharge activity level: Yes. Supervision to CGA at w/c level  Home/community participation: Yes. Limited.   Services provided included: MD, RD, PT, OT, SLP, RN, CM, TR, Pharmacy, Neuropsych and SW  Financial Services: Medicare  Choices offered to/list presented to:Yes  Follow-up services arranged: Home Health: [facility] for HHPT/OT/SN/Aide and DME: [facility] for power chair (loaner)   Comments (or additional information):  Patient/Family verbalized understanding of follow-up arrangements: Yes  Individual responsible for coordination of the follow-up plan: Contact pt [phone] or pt husband [phone]  Confirmed correct DME delivered: Rana Snare 06/18/2020    Rana Snare

## 2020-06-18 NOTE — Progress Notes (Signed)
Occupational Therapy Session Note  Patient Details  Name: Kaitlin Hudson MRN: [id number] Date of Birth: 1955/05/08  Today's Date: 06/18/2020 OT Individual Time: 5697-9480 OT Individual Time Calculation (min): 24 min    Short Term Goals: Week 2:  OT Short Term Goal 1 (Week 2): STG=LTG d/t ELOS  Skilled Therapeutic Interventions/Progress Updates:     Pt in power wheelchair with spouse present.  Worked on scooting transfer from the power wheelchair to the drop arm commode with simulated setup.  Did not drop the arm of the commode as pt instead pulls up in front perpendicular to the surface and scoots onto it.  She then rotates her body to the proper position.  Min assist was needed to complete transfer on and off this session with spouse providing hands on assistance.  She was left up with call button and phone in reach.    Therapy Documentation Precautions:  Precautions Precautions: Fall Precaution Comments: B AKA Restrictions Weight Bearing Restrictions: No RLE Weight Bearing: Non weight bearing LLE Weight Bearing: Non weight bearing  Pain: Pain Assessment Pain Scale: Faces Pain Score: 0-No pain ADL: See Care Tool Section for some details of mobility and selfcare  Therapy/Group: Individual Therapy  Zaki Gertsch OTR/L 06/18/2020, 4:00 PM

## 2020-06-18 NOTE — Progress Notes (Signed)
Orthopedic Tech Progress Note Patient Details:  Kaitlin Hudson [birth year] [id number] Called order into [facility] Patient ID: Kaitlin Hudson, female   DOB: [birth year], 65 y.o.   MRN: [id number]   Kaitlin Hudson 06/18/2020, 2:39 PM

## 2020-06-18 NOTE — Progress Notes (Signed)
Patient ID: Kaitlin Hudson, female   DOB: 26-Oct-1955, 65 y.o.   MRN: [id number]   SW updated pt and pt husband on [facility] being [facility]. SW sent order to [facility].   Cecile Sheerer, MSW, LCSWA Office: [phone] Cell: [phone] Fax: [phone]

## 2020-06-18 NOTE — Progress Notes (Signed)
Nutrition Follow-up  DOCUMENTATION CODES:   Not applicable  INTERVENTION:   -Glucerna Shake podaily, each supplement provides 220 kcal and 10 grams of protein  - ProSource Plus 30 ml po daily, each supplement provides 100 kcal and 15 grams of protein  - Family to bring in [employer] protein shakes from home, each shake provides 240 kcal and 26 grams of protein  NUTRITION DIAGNOSIS:   Increased nutrient needs related to wound healing as evidenced by estimated needs.  Ongoing  GOAL:   Patient will meet greater than or equal to 90% of their needs  Progressing  MONITOR:   PO intake,Supplement acceptance,Labs,Weight trends,Skin  REASON FOR ASSESSMENT:   Malnutrition Screening Tool    ASSESSMENT:   65 year old female with PMH of T1DM with insulin pump, PVD, HTN, HLD, DVT, depression, CAD, RA, anxiety, bilateral wrist surgeries, trigger finger release, right shoulder surgery, right foot surgery, bilateral carpal tunnel release, bilateral BKAs in 12/2019. Pt presented on 06/04/20 with bilateral BKA wound dehiscence. Pt was seen by Ortho and decided to proceed with bilateral AKAs on 06/04/20. Admitted to CIR on 3/27.  Noted target d/c date of 4/8.  Spoke with pt at bedside. Pt reports that she was brought tomatoes on her lunch tray and that she does not eat tomatoes. Pt's husband is warming her up some chicken noodle soup to consume with fruit for lunch.  Pt reports that she plans to continue consuming Glucerna supplements at discharge. Pt has been consuming ProSource Plus during admission. RD again discussed importance of adequate kcal and protein intake in wound healing.  Meal Completion: 25-75%  Medications reviewed and include: ProSource Plus daily, cholecalciferol, colace, Glucerna daily, SSI, novolog 4 units TID, lantus 14 units BID, miralax, prednisone  Labs reviewed: BUN 26, creatinine 1.03, hemoglobin 8.0 CBG's: 58-214 x 24 hours  Diet Order:   Diet Order             Diet Carb Modified Fluid consistency: Thin; Room service appropriate? Yes  Diet effective now                 EDUCATION NEEDS:   Education needs have been addressed  Skin:  Skin Assessment:  Skin Integrity Issues: Stage II: left buttocks Incisions: right leg s/p AKA, left leg s/p AKA Other: skin tear to right arm, left arm  Last BM:  06/17/20  Height:   Ht Readings from Last 1 Encounters:  06/10/20 3' 6.91" (1.09 m)    Weight:   Wt Readings from Last 1 Encounters:  06/08/20 80.4 kg    BMI:  Body mass index is 67.67 kg/m.  Estimated Nutritional Needs:   Kcal:  1800-2000  Protein:  85-100 grams  Fluid:  1.8-2.0 L    Mertie Clause, MS, RD, LDN Inpatient Clinical Dietitian Please see AMiON for contact information.

## 2020-06-18 NOTE — Progress Notes (Signed)
Physical Therapy Session Note  Patient Details  Name: Kaitlin Hudson MRN: [id number] Date of Birth: 12-Mar-1956  Today's Date: 06/18/2020 PT Individual Time: 1650-1730 PT Individual Time Calculation (min): 40 min   Short Term Goals: Week 1:  PT Short Term Goal 1 (Week 1): Pt will roll side to side using side rails w/ min A PT Short Term Goal 2 (Week 1): Pt will transfers sup to long sit w/ mod A PT Short Term Goal 3 (Week 1): Pt will transfer bed<> power chair w/ mod A, anterior-posterior or SB  Skilled Therapeutic Interventions/Progress Updates:    Pt received sitting in loaner mid-wheel PWC with her husband present. Pt agreeable to therapy session and requests to focus on outdoor w/c mobility due to pt's fear of navigating over unlevel surfaces. Pt's husband present throughout session for education as well. Pt performed >1,058ft PWC mobility on CIR floor, on/off elevator, and through hospital doorways mod-I demonstrating safe turning with careful monitoring of surrounds - did increase setting to "indoor moderate" but decreased the available speed range at that setting to reach pt's preferred mobility speed (demos safety at this setting throughout session). Performed ~342ft of outdoor PWC mobility up/down 2 different ramps with close supervision for safety and verbal cuing for technique - pt demonstrates some nervousness with this task that was improving with increased exposure. In PWC reclined back to decrease trunk support and performed trunk control/sitting balance reaching task to complete 2 games of horseshoes. Performed PWC mobility back inside and up to room on CIR mod-I. Pt reports no current questions/concerns regarding upcoming D/C plan and reports feeling grateful for some exposure practice with outdoor w/c mobility. Pt left sitting in PWC with seat belt on, her husband present, and needs in reach.   Therapy Documentation Precautions:  Precautions Precautions: Fall Precaution Comments: B  AKA Restrictions Weight Bearing Restrictions: No RLE Weight Bearing: Non weight bearing LLE Weight Bearing: Non weight bearing  Pain: No reports of pain throughout session.   Therapy/Group: Individual Therapy  Ginny Forth , PT, DPT, CSRS  06/18/2020, 5:44 PM

## 2020-06-18 NOTE — Progress Notes (Signed)
Occupational Therapy Discharge Summary  Patient Details  Name: Kaitlin Hudson MRN: [id number] Date of Birth: 1955-06-12   Patient has met 7 of 7 long term goals due to improved activity tolerance, improved balance, postural control and ability to compensate for deficits.  Patient to discharge at overall Supervision level.  Patient's care partner is independent to provide the necessary cognitive assistance at discharge. Pt's husband [name] has been consistently present and supportive. They have all the necessary equipment from prior admission.   Reasons goals not met: All treatment goals met.   Recommendation:  Patient will benefit from ongoing skilled OT services in home health setting to continue to advance functional skills in the area of BADL, iADL and Reduce care partner burden.  Equipment: No equipment provided  Reasons for discharge: treatment goals met and discharge from hospital  Patient/family agrees with progress made and goals achieved: Yes  OT Discharge Precautions/Restrictions  Precautions Precautions: Fall Precaution Comments: B AKA Restrictions Weight Bearing Restrictions: Yes RLE Weight Bearing: Non weight bearing LLE Weight Bearing: Non weight bearing ADL ADL Eating: Modified independent Where Assessed-Eating: Bed level Grooming: Modified independent Where Assessed-Grooming: Wheelchair Upper Body Bathing: Setup Where Assessed-Upper Body Bathing: Bed level Lower Body Bathing: Modified independent Where Assessed-Lower Body Bathing: Bed level Upper Body Dressing: Modified independent (Device) Where Assessed-Upper Body Dressing: Bed level Lower Body Dressing: Supervision/safety Where Assessed-Lower Body Dressing: Bed level Toileting: Minimal assistance Where Assessed-Toileting: Bedside Commode Toilet Transfer: Close supervision Toilet Transfer Method: Theatre manager: Extra wide drop arm bedside commode Tub/Shower Transfer: Unable to  assess Tub/Shower Transfer Method: Unable to assess ADL Comments: ADL bed level this session Vision Baseline Vision/History: No visual deficits Patient Visual Report: No change from baseline Vision Assessment?: No apparent visual deficits Perception  Perception: Within Functional Limits Praxis Praxis: Intact Cognition Overall Cognitive Status: Within Functional Limits for tasks assessed Arousal/Alertness: Awake/alert Orientation Level: Oriented X4 Attention: Selective Selective Attention: Appears intact Memory: Appears intact Awareness: Appears intact Problem Solving: Appears intact Safety/Judgment: Appears intact Comments: Pt with baseline anxiety and reduced self-efficacy that often impact ADLs/transfers Sensation Sensation Light Touch: Impaired Detail Central sensation comments: Pt reports some tingling distally, especially in the L residual limb. Difficult to discern true sensory deficits currently d/t continued impaired healing of her residual limbs Coordination Gross Motor Movements are Fluid and Coordinated: No Fine Motor Movements are Fluid and Coordinated: Yes Coordination and Movement Description: Limited by generalized weakness and B AKA Motor  Motor Motor: Abnormal postural alignment and control Motor - Discharge Observations: Generalized weakness, kyphotic posture Mobility  Bed Mobility Bed Mobility: Rolling Right;Rolling Left Rolling Right: Independent with assistive device Rolling Left: Independent with assistive device  Trunk/Postural Assessment  Cervical Assessment Cervical Assessment: Within Functional Limits Thoracic Assessment Thoracic Assessment: Exceptions to [facility] (kyphotic posture) Lumbar Assessment Lumbar Assessment: Exceptions to [facility] (posterior pelvic tilt to compensate for core weakness) Postural Control Postural Control: Deficits on evaluation (delayed)  Balance Balance Balance Assessed: Yes Static Sitting Balance Static Sitting - Level of  Assistance: 6: Modified independent (Device/Increase time) Dynamic Sitting Balance Dynamic Sitting - Balance Support: Right upper extremity supported Dynamic Sitting - Level of Assistance: 6: Modified independent (Device/Increase time) Extremity/Trunk Assessment RUE Assessment RUE Assessment: Within Functional Limits LUE Assessment LUE Assessment: Within Functional Limits   Curtis Sites 06/18/2020, 11:22 AM

## 2020-06-18 NOTE — Progress Notes (Signed)
Physical Therapy Session Note  Patient Details  Name: Kaitlin Hudson MRN: [id number] Date of Birth: 1955/06/12  Today's Date: 06/18/2020 PT Individual Time: 1345-1455 PT Individual Time Calculation (min): 70 min   Short Term Goals: Week 1:  PT Short Term Goal 1 (Week 1): Pt will roll side to side using side rails w/ min A PT Short Term Goal 2 (Week 1): Pt will transfers sup to long sit w/ mod A PT Short Term Goal 3 (Week 1): Pt will transfer bed<> power chair w/ mod A, anterior-posterior or SB  Skilled Therapeutic Interventions/Progress Updates:     Patient in bed with her husband in the room upon PT arrival. Patient alert and agreeable to PT session. Patient reported 4/10 B residual limb pain during session, RN made aware. PT provided repositioning, rest breaks, and distraction as pain interventions throughout session.   Patient reports that both sets of stump socks have been stained due to frequent drainage, reached out to PA to order a clean set and reviewed cleaning instructions with patient and her husband. Advised against use of harsh stain removers due to socks being applied to patient's skin. Recommended soap and water with hand washing.  Therapeutic Activity: Bed Mobility: Patient performed rolling R/L with and without use of bed rails and supine to/from sit with mod I with use of L rail to simulate home set-up, bed flat throughout. Performed scooting EOB with supervision provided by her husband.  Transfers: Patient's husband set-up power w/c for bed>w/c transfer independently. Patient performed a slide board transfer bed>power w/c with sueprvision and total A for board placement with her husband providing assist and cues for hand placement and head-hips relationship x1. Patient and her husband appropriately discussed proper board placement for safe transfer, demonstrated good communication and problem solving without cues.    Wheelchair Mobility:  New loaner power w/c arrived prior to  session. Utilized Sport and exercise psychologist throughout session. Reviewed controls and toggling through seat and steering using controls with patient using teach back method throughout session. Patient propelled wheelchair >200 feet x2 independently. Maneuvered power w/c in ADL apartment to simulate home set-up independently, except supervision for backing up for safety, patient appropriately asking for assist to ensure that she did not hit anything behind her. Discussed kitchen set-up and returning to doing dishes and heading things in the microwave. Simulated using seat elevator with min cues for set-up. Seat elevator enables the patient to be independent with IADLs in home environment with improved reach and access to home devices.   Patient became emotional during IADL training as she is grieving the loss of her limbs and potential for prosthesis use. Discussed benefits of power w/c mobility versus prosthesis for B AKAs. Educated on high risk of skin breakdown and energy expenditure with B prostheses and improved quality of life and community access with power mobility. Provided emotional support and encouraged the patient to allow herself to grieve, as she has experienced a significant loss. Provided education on healthy coping strategies and encouraged the patient to continue to communicate about her mental health with health care providers and family/friends to allow them to offer appropriate support for her. Patient in better spirits following discussion/education and very appreciative.   Patient in power w/c at end of session with chair turned off and all needs within reach.    Therapy Documentation Precautions:  Precautions Precautions: Fall Precaution Comments: B AKA Restrictions Weight Bearing Restrictions: No RLE Weight Bearing: Non weight bearing LLE Weight Bearing: Non weight bearing  Therapy/Group: Individual Therapy  Sinai Mahany L Chellsea Beckers PT, DPT  06/18/2020, 4:54 PM

## 2020-06-18 NOTE — Discharge Summary (Signed)
Physician Discharge Summary  Patient ID: Kaitlin Hudson MRN: [id number] DOB/AGE: [birth year] 65 y.o.  Admit date: 06/08/2020 Discharge date: 06/20/2020  Discharge Diagnoses:  Principal Problem:   S/P AKA (above knee amputation) bilateral (HCC) Active Problems:   Generalized anxiety disorder Constipation Diabetes mellitus with peripheral neuropathy Hypertension Acute on chronic renal failure CAD Rheumatoid arthritis  Discharged Condition: Stable  Significant Diagnostic Studies: No results found.  Labs:  Basic Metabolic Panel: Recent Labs  Lab 06/15/20 0440  NA 139  K 4.8  CL 107  CO2 25  GLUCOSE 132*  BUN 26*  CREATININE 1.03*  CALCIUM 8.7*    CBC: Recent Labs  Lab 06/16/20 0712  WBC 12.8*  HGB 8.0*  HCT 25.9*  MCV 84.9  PLT 411*    CBG: Recent Labs  Lab 06/18/20 2126 06/19/20 0624 06/19/20 1123 06/19/20 1626 06/19/20 2043  GLUCAP 120* 109* 223* 234* 256*   Family history.  Positive for hypertension as well as diabetes mellitus.  Denies any colon cancer esophageal cancer or rectal cancer  Brief HPI:   Kaitlin Hudson is a 65 y.o. right-handed female with history of type 1 diabetes mellitus with insulin pump, PVD, hypertension hyperlipidemia headaches DVT depression, CAD, anxiety, bilateral wrist surgeries, trigger finger release, right shoulder surgery, right foot surgery, bilateral carpal tunnel release, bilateral BKA's 12/2019 who presented 06/04/2020 with bilateral BKA wound dehiscence status post bilateral AKA's.  History taken from chart review patient noted to have venous and lymphatic insufficiency with significant edema and appeared to have nonhealing surgical incisions.  She also had increasing burning pain.  She was seen by orthopedic services decided to proceed with bilateral AKA's per Dr. Lajoyce Corners on 06/04/2020 with application of wound VAC which were later DC'd due to tolerance.  Hospital course complicated by postoperative pain labile blood sugars acute  blood loss anemia.  Therapy evaluations completed due to patient decreased functional mobility was admitted for a comprehensive rehab program.   Hospital Course: Kaitlin Hudson was admitted to rehab 06/08/2020 for inpatient therapies to consist of PT, ST and OT at least three hours five days a week. Past admission physiatrist, therapy team and rehab RN have worked together to provide customized collaborative inpatient rehab.  Pertaining to patient's bilateral AKA wound sites healing slowly neurovascular sensation intact she would follow-up with Dr. Lajoyce Corners and patient was maintained on doxycycline for wound coverage.  Pain management with the use of Robaxin and oxycodone as well as scheduled OxyContin.  Patient required prior authorization for OxyContin denied thus her oxycodone was adjusted to 15 mg every 6 hours as needed.  Patient with history of anxiety with emotional support provided.  Blood sugars monitored on Lantus insulin full diabetic teaching and she would resume her insulin pump at home.  Blood pressure control with low-dose beta-blocker and would follow-up outpatient.  Bouts of constipation resolved with laxative assistance.  Patient did have a history of PVD maintained on aspirin as well as Plavix which had initially been held due to monitoring for any bleeding.  Hemoglobin stable at 8.0.  Acute on chronic renal insufficiency latest creatinine 1.03.Marland Kitchen  She did continue on chronic prednisone for history of rheumatoid arthritis.   Blood pressures were monitored on TID basis and   Diabetes has been monitored with ac/hs CBG checks and SSI was use prn for tighter BS control.    Rehab course: During patient's stay in rehab weekly team conferences were held to monitor patient's progress, set goals and discuss barriers to discharge. At admission,  patient required moderate assist supine to sit moderate assist lateral scoot transfers.  Moderate assist upper body bathing max is lower body bathing moderate  assist upper body dressing max is lower body dressing  Physical exam.  Blood pressure 132/69 pulse 89 temperature 98.2 respirations 17 oxygen saturations 100% room air Constitutional.  No acute distress HEENT Head.  Normocephalic and atraumatic Eyes.  Pupils round and reactive to light no discharge.nystagmus Neck.  Supple nontender no JVD without thyromegaly Cardiac regular rate rhythm not extra sounds or murmur heard Abdomen.  Soft nontender positive bowel sounds without rebound Respiratory effort normal no respiratory distress without wheeze Skin.  Bilateral AKA's with dressing intact clean and dry Neurologic.  Alert oriented.  Bilateral upper extremities 4/5 proximal to distal Right lower extremity hip flexion 4+/5 pain inversion Left lower extremity hip flexion 2+/5 pain inhibition  He/She  has had improvement in activity tolerance, balance, postural control as well as ability to compensate for deficits. He/She has had improvement in functional use RUE/LUE  and RLE/LLE as well as improvement in awareness.  Power wheelchair mobility supervision.  Patient able to properly set up wheelchair for right lateral scoot transfers.  Completed transfer with assistance for board placement and then close supervision contact-guard.  Sit to supine on mat with close supervision.  Her husband did help set up with her power chair at times.  Worked on simulated lower body dressing and clothing management with the use of Thera-Band she was able to thread it over her lower extremities and then pull up over her hips and supervision.  She reported completing a scooting transfer over to the drop arm commode at home without use of a sliding board and plans to continue this at home.  Full family teaching completed and discharged to home       Disposition: Discharged to home    Diet: Diabetic diet  Special Instructions: No driving smoking or alcohol  Cleanse incisions daily warm Dial soap and water  Apply 4  x 4's, Kerlix to bilateral AKA wounds.  Change daily as needed for saturation of dressing  Medications at discharge 1.  Tylenol as needed 2.  Aspirin 81 mg p.o. daily 3.  Vitamin D 1000 units p.o. daily 4.  Plavix 75 mg p.o. daily 5.  Colace 100 mg p.o. twice daily 6.  Trinsicon 1 capsule twice daily 7.  Continue insulin pump as directed 8.  Robaxin 500 mg every 8 hours as needed muscle spasms 9.  Lopressor 12.5 mg p.o. twice daily 10.  Oxycodone 15 mg every 6 hours as needed pain 11.  MiraLAX twice daily hold for loose stools 12.  Prednisone 12.5 mg with breakfast and 2.5 mg daily with supper 13  Bumex 2 mg p.o. daily 14.  Declomycin 100 mg every 12 hours x1 week  30-35 minutes were spent completing discharge summary and discharge planning  Discharge Instructions    Ambulatory referral to Physical Medicine Rehab   Complete by: As directed    Moderate complexity follow-up 2 weeks bilateral AKA's related to wound dehiscence       Follow-up Information    Ranelle Oyster, MD Follow up.   Specialty: Physical Medicine and Rehabilitation Why: As directed Contact information: [address] [place] [address] [phone]        Nadara Mustard, MD Follow up.   Specialty: Orthopedic Surgery Why: Call for appointment Contact information: [address] [place] [address] [phone]  Signed: Mcarthur Rossetti Mafalda Mcginniss 06/20/2020, 5:09 AM

## 2020-06-18 NOTE — Progress Notes (Signed)
Patient would like to know if she can replace the [employer] with Core Power Chocolate Protein drink by [employer]. 14 ounces, 170 calories, 5 g total sugars, 0 added sugars, 26 g of complete protein. Please ask physician.

## 2020-06-18 NOTE — Progress Notes (Signed)
Occupational Therapy Session Note  Patient Details  Name: Kaitlin Hudson MRN: [id number] Date of Birth: 1956/01/23  Today's Date: 06/18/2020 OT Individual Time: 1007-1219 OT Individual Time Calculation (min): 45 min    Short Term Goals: Week 2:  OT Short Term Goal 1 (Week 2): STG=LTG d/t ELOS  Skilled Therapeutic Interventions/Progress Updates:    Pt received sitting up in bed, mild pain described as stinging in her L residual limb but reporting she is premedicated. She was able to don pants supine with rolling R and L with supervision! Pt completed bed mobility with heavy use of bed rails and CGA overall. Min cueing for positioning. Pt and her husband [name] were able to complete full transfer with [place] setting up [place] and then [place] requiring no physical assist for lateral scoot into the power w/c. Pt navigated chair to the therapy gym with edu provided re toggling through different speeds. She transferred to the mat with CGA. Pt completed activity to simulate toileting tasks, with wide lateral leans while grading UE support and forward reaching to challenge sitting balance. Close supervision provided throughout. Pt completed core stabilization exercises with dynamic throwing/catching activity with cueing for muscle activation and positioning. Pt returned to the power w/c and was left sitting up in her room with all needs met, husband present.   Therapy Documentation Precautions:  Precautions Precautions: Fall Precaution Comments: dressings difficult to maintain, shrinkers to be applied. Restrictions Weight Bearing Restrictions: Yes RLE Weight Bearing: Non weight bearing LLE Weight Bearing: Non weight bearing   Therapy/Group: Individual Therapy  Curtis Sites 06/18/2020, 6:32 AM

## 2020-06-19 ENCOUNTER — Telehealth: Payer: Self-pay

## 2020-06-19 LAB — GLUCOSE, CAPILLARY
Glucose-Capillary: 109 mg/dL — ABNORMAL HIGH (ref 70–99)
Glucose-Capillary: 223 mg/dL — ABNORMAL HIGH (ref 70–99)
Glucose-Capillary: 234 mg/dL — ABNORMAL HIGH (ref 70–99)
Glucose-Capillary: 256 mg/dL — ABNORMAL HIGH (ref 70–99)

## 2020-06-19 MED ORDER — ACETAMINOPHEN 325 MG PO TABS: 325.0000 mg | ORAL_TABLET | ORAL | Status: AC | PRN

## 2020-06-19 MED ORDER — BUMETANIDE 2 MG PO TABS: 2.0000 mg | ORAL_TABLET | Freq: Every day | ORAL | 0 refills | Status: AC

## 2020-06-19 MED ORDER — OXYCODONE HCL 5 MG PO TABS: ORAL_TABLET | ORAL | 0 refills | Status: DC

## 2020-06-19 MED ORDER — ELIQUIS 5 MG PO TABS: 5.0000 mg | ORAL_TABLET | Freq: Two times a day (BID) | ORAL | 0 refills | Status: DC

## 2020-06-19 MED ORDER — CLOPIDOGREL BISULFATE 75 MG PO TABS: 75.0000 mg | ORAL_TABLET | Freq: Every day | ORAL | 0 refills | Status: AC

## 2020-06-19 MED ORDER — POLYETHYLENE GLYCOL 3350 17 G PO PACK: 17.0000 g | PACK | Freq: Two times a day (BID) | ORAL | 0 refills | Status: AC

## 2020-06-19 MED ORDER — METOPROLOL TARTRATE 25 MG PO TABS: 12.5000 mg | ORAL_TABLET | Freq: Two times a day (BID) | ORAL | 0 refills | Status: AC

## 2020-06-19 MED ORDER — OXYCODONE HCL 15 MG PO TABS: ORAL_TABLET | ORAL | 0 refills | Status: AC

## 2020-06-19 MED ORDER — PREDNISONE 2.5 MG PO TABS: 12.5000 mg | ORAL_TABLET | Freq: Every day | ORAL | Status: AC

## 2020-06-19 MED ORDER — METHOCARBAMOL 500 MG PO TABS: 500.0000 mg | ORAL_TABLET | Freq: Three times a day (TID) | ORAL | 3 refills | Status: AC | PRN

## 2020-06-19 MED ORDER — PREDNISONE 2.5 MG PO TABS: 2.5000 mg | ORAL_TABLET | Freq: Every day | ORAL | Status: AC

## 2020-06-19 MED ORDER — OXYCODONE HCL ER 20 MG PO T12A: 20.0000 mg | EXTENDED_RELEASE_TABLET | Freq: Two times a day (BID) | ORAL | 0 refills | Status: AC

## 2020-06-19 MED ORDER — GLUCERNA SHAKE PO LIQD: 237.0000 mL | ORAL | Status: DC

## 2020-06-19 MED ORDER — GLUCERNA SHAKE PO LIQD
237.0000 mL | ORAL | Status: DC
  Filled 2020-06-19: qty 237

## 2020-06-19 MED ORDER — OXYCODONE HCL 10 MG PO TABS: 10.0000 mg | ORAL_TABLET | ORAL | 0 refills | Status: DC | PRN

## 2020-06-19 MED ORDER — VITAMIN D3 25 MCG (1000 UNIT) PO TABS: 1000.0000 [IU] | ORAL_TABLET | Freq: Every day | ORAL | 0 refills | Status: AC

## 2020-06-19 NOTE — Progress Notes (Signed)
PROGRESS NOTE   Subjective/Complaints: Feeling fairly well this morning. Pain is under better control. Wheel chair assessment went well yesterday. In loaner chair right now. Continued serosanguinous drainage left stump  ROS: Patient denies fever, rash, sore throat, blurred vision, nausea, vomiting, diarrhea, cough, shortness of breath or chest pain, headache, or mood change.       Objective:   No results found. No results for input(s): WBC, HGB, HCT, PLT in the last 72 hours. No results for input(s): NA, K, CL, CO2, GLUCOSE, BUN, CREATININE, CALCIUM in the last 72 hours.  Intake/Output Summary (Last 24 hours) at 06/19/2020 0925 Last data filed at 06/19/2020 0835 Gross per 24 hour  Intake 500 ml  Output --  Net 500 ml     Pressure Injury 06/17/20 Buttocks Left;Medial Stage 2 -  Partial thickness loss of dermis presenting as a shallow open injury with a red, pink wound bed without slough. (Active)  06/17/20 0135  Location: Buttocks  Location Orientation: Left;Medial  Staging: Stage 2 -  Partial thickness loss of dermis presenting as a shallow open injury with a red, pink wound bed without slough.  Wound Description (Comments):   Present on Admission:     Physical Exam: Vital Signs Blood pressure (!) 119/57, pulse 87, temperature 98.4 F (36.9 C), resp. rate 20, height 3' 6.91" (1.09 m), weight 80.4 kg, SpO2 97 %.    Constitutional: No distress . Vital signs reviewed. HEENT: EOMI, oral membranes moist Neck: supple Cardiovascular: RRR without murmur. No JVD    Respiratory/Chest: CTA Bilaterally without wheezes or rales. Normal effort    GI/Abdomen: BS +, non-tender, non-distended Ext: no clubbing, cyanosis, left stump still swollen Psych: pleasant and cooperative Neurological: Ox3  Skin: right AKA incision sl red at margins, near sutures. Left AKA similar in appearance except for central area which is draining  significant s/s fluid. No odor. Neuro: alert and oriented. Cranial nerves 2-12 are intact. Sensory exam is normal. Reflexes are 2+ in all 4's. Fine motor coordination is intact. No tremors. Motor function is grossly 5/5 UE and  3/5 LE R>L HF Musculoskeletal: left stump remains more tender than right     Assessment/Plan: 1. Functional deficits which require 3+ hours per day of interdisciplinary therapy in a comprehensive inpatient rehab setting.  Physiatrist is providing close team supervision and 24 hour management of active medical problems listed below.  Physiatrist and rehab team continue to assess barriers to discharge/monitor patient progress toward functional and medical goals  Care Tool:  Bathing    Body parts bathed by patient: Right arm,Left arm,Chest,Abdomen,Front perineal area,Right upper leg,Left upper leg,Face   Body parts bathed by helper: Buttocks,Right upper leg,Left upper leg Body parts n/a: Right lower leg,Left lower leg   Bathing assist Assist Level: Moderate Assistance - Patient 50 - 74%     Upper Body Dressing/Undressing Upper body dressing   What is the patient wearing?: Pull over shirt    Upper body assist Assist Level: Minimal Assistance - Patient > 75%    Lower Body Dressing/Undressing Lower body dressing      What is the patient wearing?: Pants,Incontinence brief     Lower body assist Assist  for lower body dressing: Maximal Assistance - Patient 25 - 49%     Toileting Toileting    Toileting assist Assist for toileting: Total Assistance - Patient < 25%     Transfers Chair/bed transfer  Transfers assist     Chair/bed transfer assist level: Supervision/Verbal cueing Chair/bed transfer assistive device: Sliding board   Locomotion Ambulation   Ambulation assist   Ambulation activity did not occur: N/A (new B AKA)          Walk 10 feet activity   Assist  Walk 10 feet activity did not occur: N/A        Walk 50 feet  activity   Assist Walk 50 feet with 2 turns activity did not occur: N/A         Walk 150 feet activity   Assist Walk 150 feet activity did not occur: N/A         Walk 10 feet on uneven surface  activity   Assist Walk 10 feet on uneven surfaces activity did not occur: N/A         Wheelchair     Assist Will patient use wheelchair at discharge?: Yes Type of Wheelchair: Power    Wheelchair assist level: Independent Max wheelchair distance: 1,074ft    Wheelchair 50 feet with 2 turns activity    Assist        Assist Level: Independent   Wheelchair 150 feet activity     Assist      Assist Level: Independent   Blood pressure (!) 119/57, pulse 87, temperature 98.4 F (36.9 C), resp. rate 20, height 3' 6.91" (1.09 m), weight 80.4 kg, SpO2 97 %.  Medical Problem List and Plan: 1.  Deficits with mobility, transfers, endurance, self-care secondary to wound dehiscence status post bilateral AKA.             Patient may shower with incisions covered             Goals/ELOS: 06/20/20,  Min/mod a at wheelchair level              -w/c assessment, loaner chair   2.  DVT Prophylaxis/Anticoagulation, hx of DVT (8/21):     - Given the documentation I can find from cardiology, her bleeding risk, we have stopped eliquis and resumed prophylactic lovenox--holding for now d/t bleeding from stump         Antiplatelets: Clopidogrel 75, ASA 81 3. Pain Management: Oxycodone as needed             Robaxin 500 qid scheduled             Cymbalta 60 daily  -have reviewed importance of edema control, massage/desensitization             3/29  added oxycontin 10mg  q12  4/5 adjusted am oycontin to 0600 to better control pain when she first moves in the morning 4. Mood/anxiety/depression: Team to provide ongoing egosupport             Cymbalta 60 mg daily              -appreciate neuropsych input    -pain control helping mood 5. Neuropsych: This patient is capable of making  decisions on her own behalf. 6. Skin/Wound Care: dry dressing to stumps, absorb drainage esp on left  -will ask ortho to check in on incisions prior to discharge to see if there is anything they would like to do different  - stump socks for dressing retention  -  foam dressing to sacrum  -air mattress for comfort/pressure relief 7. Fluids/Electrolytes/Nutrition: intake inconsistent  -pt understands importance of nutrition             labs reviewed from 4/3---improved BUN 8.  Vitamin D deficiency             Vitamin D 1000 units daily 9.  Slow transit constipation---intake starting to pick up but still suboptimal             colace, miralax  -results with sorbitol                 10.  Type 1 diabetes mellitus type II with hyperglycemia             SSI             Patient has insulin pump. This was not resumed. She sent it home   CBG (last 3)  Recent Labs    06/18/20 1828 06/18/20 2126 06/19/20 0624  GLUCAP 153* 120* 109*    4/7 cbg's improved---continue lantus 14u bid 11.  PVD             See #2  12.  Essential hypertension             Lopressor 12.5 twice daily, Bumex 2 mg daily  -controlled 3/29  4/7- BP controlled  13.  Hyperlipidemia             Zestril 40 mg daily 14.  Acute on chronic renal failure:  -BUN improved 3/30  -K+ 4.8 4/3, continue to hold lisinopril 15.  CAD             See #2 16.  RA             Prednisone 12.5 mg daily, 2.5 mg nightly 17.  Leukocytosis             Afebrile             wbc's stable 4/4 18.  Acute blood loss anemia             Hemoglobin 8.0 4/4  -check labs in am 4/8     LOS: 11 days A FACE TO FACE EVALUATION WAS PERFORMED  Ranelle Oyster 06/19/2020, 9:25 AM

## 2020-06-19 NOTE — Progress Notes (Signed)
Occupational Therapy Session Note  Patient Details  Name: Kaitlin Hudson MRN: [id number] Date of Birth: Jan 29, 1956  Today's Date: 06/19/2020 OT Individual Time: 4818-5631 OT Individual Time Calculation (min): 60 min    Short Term Goals: Week 2:  OT Short Term Goal 1 (Week 2): STG=LTG d/t ELOS  Skilled Therapeutic Interventions/Progress Updates:    Pt supine with mild pain in her residual limbs, agreeable to OT session. Discussed d/c and residual limb care. Performed dressing change as initially ordered with extra time spent discussing stump socks and new socks to be delivered by orthotist (not yet delivered in session). Pt completed LB bathing and dressing from bed level with supervision overall, min cueing for positioning at times. Pt performed UB bathing and dressing sitting in bed with supervision as well. Slideboard placement and then supervision for pt to transfer to power w/c from bed. Pt was left sitting up with all needs met.   Therapy Documentation Precautions:  Precautions Precautions: Fall Precaution Comments: B AKA Restrictions Weight Bearing Restrictions: Yes RLE Weight Bearing: Non weight bearing LLE Weight Bearing: Non weight bearing   Therapy/Group: Individual Therapy  Curtis Sites 06/19/2020, 10:57 AM

## 2020-06-19 NOTE — Telephone Encounter (Signed)
Terry from [facility] called stating he had questions about prescription for Oxycodone that was sent.

## 2020-06-19 NOTE — Progress Notes (Signed)
Physical Therapy Discharge Summary  Patient Details  Name: Kaitlin Hudson MRN: [id number] Date of Birth: 06/09/1955  Today's Date: 06/19/2020 PT Individual Time: 1003-1100 PT Individual Time Calculation (min): 57 min    Patient has met 5 of 6 long term goals due to improved activity tolerance, improved balance, improved postural control, increased strength, increased range of motion, decreased pain and ability to compensate for deficits.  Patient to discharge at a power wheelchair level [place].   Patient's care partner is independent to provide the necessary physical assistance at discharge.  Reasons goals not met: Patient did not progress to furniture transfers due to need for ability to transfer uphill. Patient with significant drainage from residual limbs during level and downhill transfers that requiring less exertion. Will progress to furniture transfers with HHPT when drainage improves. Patient in agreement.   Recommendation:  Patient will benefit from ongoing skilled PT services in home health setting to continue to advance safe functional mobility, address ongoing impairments in strength/ROM, activity tolerance, power w/c mobility, functional mobility, community integration, skin integrity, and minimize fall risk.  Equipment: loaner power wheelchair, patient has slide board and wheelchair accessable [name]  Reasons for discharge: treatment goals met  Patient/family agrees with progress made and goals achieved: Yes   Skilled Therapeutic Interventions: Pt received seated in power WC and agrees to therapy. Reports pain in bilateral residual limbs L > R. PT assist pt to don shrinker sock on R leg but is unable to perform on L leg due to increased swelling and pain. PT ace wraps L residual limb. PT educates pt and husband on power WC features and has pt demo use. PT educates pt on pressure relief. Pt drives power WC V616' to dayroom. Pt performs slideboard transfer to mat table with  supervision and cues for head hips relationship. Sit to supine with supervision and cues for positioning. In supine PT able to improve fit of R shrinker sock (and educates husband on this positioning). Pt returns to sitting with cues on use of arms and trunk for momentum. Slideboard back WC with supervision. Left seated with all needs within reach.  PT Discharge Precautions/Restrictions Precautions Precautions: Fall Precaution Comments: B AKA Restrictions RLE Weight Bearing: Non weight bearing LLE Weight Bearing: Non weight bearingn   Vision/Perception  Perception Perception: Within Functional Limits Praxis Praxis: Intact  Cognition Overall Cognitive Status: Within Functional Limits for tasks assessed Arousal/Alertness: Awake/alert Attention: Selective Selective Attention: Appears intact Memory: Appears intact Awareness: Appears intact Problem Solving: Appears intact Safety/Judgment: Appears intact Comments: Pt with baseline anxiety and reduced self-efficacy that often impact mobility Sensation Sensation Light Touch: Impaired Detail Central sensation comments: Pt reports some tingling distally, especially in the L residual limb. Difficult to discern true sensory deficits currently d/t continued impaired healing of her residual limbs Coordination Gross Motor Movements are Fluid and Coordinated: No Fine Motor Movements are Fluid and Coordinated: Yes Coordination and Movement Description: Limited by generalized weakness and new B AKA Heel Shin Test: N/A Motor  Motor Motor: Abnormal postural alignment and control Motor - Discharge Observations: Generalized weakness, kyphotic posture, decreased balance strategies  Mobility Bed Mobility Bed Mobility: Rolling Right;Rolling Left Rolling Right: Independent with assistive device Rolling Left: Independent with assistive device Transfers Transfers: Lateral/Scoot Transfers Lateral/Scoot Transfers: Supervision/Verbal cueing Transfer  (Assistive device):  (SB and power w/c) Locomotion  Gait Ambulation: No Gait Gait: No Stairs / Additional Locomotion Ramp: Supervision/Verbal cueing (in power w/c) Product manager Mobility: Yes Wheelchair Assistance: Chartered loss adjuster: Power  Wheelchair Parts Management: Independent Distance: >500 ft  Trunk/Postural Assessment  Cervical Assessment Cervical Assessment: Within Functional Limits Thoracic Assessment Thoracic Assessment: Exceptions to [facility] (kyphotic posture) Lumbar Assessment Lumbar Assessment: Exceptions to [facility] (posterior pelvic tilt to compensate for core weakness) Lumbar Strength Overall Lumbar Strength Comments: decreased balance 2/2 changing COG, body habitus. Postural Control Postural Control: Deficits on evaluation (delayed) Trunk Control: Improved to supervision with dynamic sitting balance without upper extremity support during admission  Balance Balance Balance Assessed: Yes Static Sitting Balance Static Sitting - Level of Assistance: 6: Modified independent (Device/Increase time) Dynamic Sitting Balance Dynamic Sitting - Level of Assistance: 5: Stand by assistance Extremity Assessment  RLE Assessment General Strength Comments: grossly 4/5, hip only LLE Assessment General Strength Comments: Grossly 3/5, hip only    Doreene Burke PT, DPT Dewaine Conger, PT, DPT 06/19/2020, 1:42 PM

## 2020-06-19 NOTE — Discharge Instructions (Signed)
Inpatient Rehab Discharge Instructions  Kaitlin Hudson Discharge date and time: No discharge date for patient encounter.   Activities/Precautions/ Functional Status: Activity: As tolerated Diet: Diabetic diet Wound Care: Cleanse incision sites daily warm Dial soap and water Functional status:  ___ No restrictions     ___ Walk up steps independently ___ 24/7 supervision/assistance   ___ Walk up steps with assistance ___ Intermittent supervision/assistance  ___ Bathe/dress independently ___ Walk with walker     _x__ Bathe/dress with assistance ___ Walk Independently    ___ Shower independently ___ Walk with assistance    ___ Shower with assistance ___ No alcohol     ___ Return to work/school ________  COMMUNITY REFERRALS UPON DISCHARGE:    Home Health:   PT     OT  SNA                 Agency: [facility]   Phone:[phone] *Please expect follow-up within 2-3 days to schedule home visit. If you have not received follow-up, be sure to contact the branch directly.*    Medical Equipment/Items Ordered: power wheelchair Scientist, physiological)                                                 Agency/Supplier: [facility] [phone]    Special Instructions: No driving smoking or alcohol   Apply 4 x 4's Kerlix to bilateral AKA wounds.  Change daily as needed for saturation of dressing    My questions have been answered and I understand these instructions. I will adhere to these goals and the provided educational materials after my discharge from the hospital.  Patient/Caregiver Signature _______________________________ Date __________  Clinician Signature _______________________________________ Date __________  Please bring this form and your medication list with you to all your follow-up doctor's appointments.

## 2020-06-19 NOTE — Plan of Care (Signed)
  Problem: RH SKIN INTEGRITY Goal: RH STG ABLE TO PERFORM INCISION/WOUND CARE W/ASSISTANCE Description: STG Able To Perform Incision/Wound Care With min Assistance. Outcome: Progressing; dressing change done by husband assisted by RN

## 2020-06-19 NOTE — Progress Notes (Signed)
Occupational Therapy Session Note  Patient Details  Name: Kaitlin Hudson MRN: [id number] Date of Birth: [birth year]  Today's Date: 06/19/2020 OT Individual Time: 1300-1415 OT Individual Time Calculation (min): 75 min    Short Term Goals: Week 1:  OT Short Term Goal 1 (Week 1): patient will complete rolling in bed with CS, supine to sitting with mod A OT Short Term Goal 1 - Progress (Week 1): Met OT Short Term Goal 2 (Week 1): patient will increase anterior/posterior transfer to/from commode, bed, and w/c with mod A of one OT Short Term Goal 2 - Progress (Week 1): Met OT Short Term Goal 3 (Week 1): patient will complete adl tasks with set up/min A OT Short Term Goal 3 - Progress (Week 1): Progressing toward goal OT Short Term Goal 4 (Week 1): patient will complete toileting commode level with max A of one OT Short Term Goal 4 - Progress (Week 1): Met  Skilled Therapeutic Interventions/Progress Updates:    1:1. Pt received in [place] agreeabl eto OT with pain in residual limbs but improves with repositioning and rest PRN. Pt practices simulated BSC trasnfer to/from bariatric BSC with obstaciles placed to simuluate shower and toilet placement. Pt able to complete with S and toilet with S managing pants past hips with S using lateral leans. When doffing pants bandage came off LLE. Temporary bandage applied for transfer back to [place]. Pt reclines back and OT/husband apply new dressing with husband doing majority of work with min cues and both apply shriker sock. Pt able to tolerate well with VC for deep breathing. Pt able to tolerate shrinker sock remainder of sesison. OT, pt and husband discuss set up of shower and need to cover residual limbs prior to bathing with water prrof dressing. They recall from  Last admisison. Pt able to lateral scoot to TTB similar to [facility] as just described above. Exited session with pt seated in bed, exit alarm on and call light in reach   Therapy Documentation Precautions:   Precautions Precautions: Fall Precaution Comments: B AKA Restrictions Weight Bearing Restrictions: Yes RLE Weight Bearing: Non weight bearing LLE Weight Bearing: Non weight bearing General:   Vital Signs: Therapy Vitals Temp: 98.4 F (36.9 C) Pulse Rate: 87 Resp: 20 BP: (!) 119/57 Patient Position (if appropriate): Lying Oxygen Therapy SpO2: 97 % O2 Device: Room Air Pain:   ADL: ADL Eating: Modified independent Where Assessed-Eating: Bed level Grooming: Modified independent Where Assessed-Grooming: Wheelchair Upper Body Bathing: Setup Where Assessed-Upper Body Bathing: Bed level Lower Body Bathing: Modified independent Where Assessed-Lower Body Bathing: Bed level Upper Body Dressing: Modified independent (Device) Where Assessed-Upper Body Dressing: Bed level Lower Body Dressing: Supervision/safety Where Assessed-Lower Body Dressing: Bed level Toileting: Minimal assistance Where Assessed-Toileting: Bedside Commode Toilet Transfer: Close supervision Toilet Transfer Method: Theatre manager: Extra wide drop arm bedside commode Tub/Shower Transfer: Unable to assess Tub/Shower Transfer Method: Unable to assess ADL Comments: ADL bed level this session Vision Baseline Vision/History: No visual deficits Patient Visual Report: No change from baseline Vision Assessment?: No apparent visual deficits Perception  Perception: Within Functional Limits Praxis Praxis: Intact Exercises:   Other Treatments:     Therapy/Group: Individual Therapy  Tonny Branch 06/19/2020, 6:53 AM

## 2020-06-20 LAB — BASIC METABOLIC PANEL
Anion gap: 11 (ref 5–15)
BUN: 15 mg/dL (ref 8–23)
CO2: 24 mmol/L (ref 22–32)
Calcium: 8.5 mg/dL — ABNORMAL LOW (ref 8.9–10.3)
Chloride: 102 mmol/L (ref 98–111)
Creatinine, Ser: 0.92 mg/dL (ref 0.44–1.00)
GFR, Estimated: >60 mL/min (ref >60)
Glucose, Bld: 49 mg/dL — ABNORMAL LOW (ref 70–99)
Potassium: 4.3 mmol/L (ref 3.5–5.1)
Sodium: 137 mmol/L (ref 135–145)

## 2020-06-20 LAB — CBC
HCT: 27 % — ABNORMAL LOW (ref 36.0–46.0)
Hemoglobin: 8.1 g/dL — ABNORMAL LOW (ref 12.0–15.0)
MCH: 25.4 pg — ABNORMAL LOW (ref 26.0–34.0)
MCHC: 30 g/dL (ref 30.0–36.0)
MCV: 84.6 fL (ref 80.0–100.0)
Platelets: 479 10*3/uL — ABNORMAL HIGH (ref 150–400)
RBC: 3.19 MIL/uL — ABNORMAL LOW (ref 3.87–5.11)
RDW: 20 % — ABNORMAL HIGH (ref 11.5–15.5)
WBC: 11.8 10*3/uL — ABNORMAL HIGH (ref 4.0–10.5)
nRBC: 0.2 % (ref 0.0–0.2)

## 2020-06-20 LAB — GLUCOSE, CAPILLARY
Glucose-Capillary: 57 mg/dL — ABNORMAL LOW (ref 70–99)
Glucose-Capillary: 93 mg/dL (ref 70–99)

## 2020-06-20 MED ORDER — DOXYCYCLINE HYCLATE 100 MG PO TABS: 100.0000 mg | ORAL_TABLET | Freq: Two times a day (BID) | ORAL | 0 refills | Status: AC

## 2020-06-20 MED ORDER — DOXYCYCLINE HYCLATE 100 MG PO TABS: 100.0000 mg | ORAL_TABLET | Freq: Two times a day (BID) | ORAL | Status: DC

## 2020-06-20 NOTE — Progress Notes (Signed)
Is in her room with her husband at her side.  She is status post bilateral above-knee amputations.  Plans for discharge today by inpatient rehab.  She did say that they put on the shrinkers yesterday or the day before and that she had significant rash and itching especially on the right side.  They have now put Kerlix beneath which has improved it.  She still is noted to be scratching the area  Vital signs stable bilateral above-knee amputation 6 stitches in [place] in place she does have some skin maceration and mild erythema.  No foul odor no ascending cellulitis.  Both wounds appear a bit wet.  Patient should follow-up with [place] in 1 week.  I did place a new dressing on with Ace wraps which she does not have any reactions to.  She can shower in the best cleansing of these wounds will be antibacterial soap and water in the shower and then drying and then placing a new wrap..  Will also place on doxycycline which can be continued at least for a week and seen in our office.

## 2020-06-20 NOTE — Significant Event (Signed)
Hypoglycemic Event  CBG: 57  Treatment: Hi -c juice box (per patient request) refused orange juice.   Symptoms: None   Follow-up CBG: Time:0649 CBG Result:93  Possible Reasons for Event: unknown.   Comments/MD notified:    Valarie Merino

## 2020-06-20 NOTE — Progress Notes (Signed)
INPATIENT REHABILITATION DISCHARGE NOTE   Discharge instructions by: Deatra Ina, PA  Verbalized understanding: Yes  Skin care/Wound care: Done with pt and pt's spouse  Pain: Oxycodone given prior to discahrge  IV's- None  Tubes/Drains: none  Safety instructions: provided to pt and pt's spouse  Patient belongings: sent with pt  Discharged to: home  Discharged via: pt's own power WC  Notes: done   Marylu Lund, RN

## 2020-06-24 ENCOUNTER — Emergency Department (HOSPITAL_BASED_OUTPATIENT_CLINIC_OR_DEPARTMENT_OTHER): Payer: Medicare Other

## 2020-06-24 ENCOUNTER — Other Ambulatory Visit: Payer: Self-pay

## 2020-06-24 ENCOUNTER — Telehealth: Payer: Self-pay

## 2020-06-24 ENCOUNTER — Other Ambulatory Visit: Payer: Self-pay | Admitting: *Deleted

## 2020-06-24 ENCOUNTER — Telehealth: Payer: Self-pay | Admitting: *Deleted

## 2020-06-24 ENCOUNTER — Emergency Department (HOSPITAL_BASED_OUTPATIENT_CLINIC_OR_DEPARTMENT_OTHER)
Admission: EM | Admit: 2020-06-24 | Discharge: 2020-07-13 | Disposition: E | Payer: Medicare Other | Attending: Emergency Medicine | Admitting: Emergency Medicine

## 2020-06-24 DIAGNOSIS — Z7982 Long term (current) use of aspirin: Secondary | ICD-10-CM | POA: Insufficient documentation

## 2020-06-24 DIAGNOSIS — Z9660 Presence of unspecified orthopedic joint implant: Secondary | ICD-10-CM | POA: Diagnosis not present

## 2020-06-24 DIAGNOSIS — I110 Hypertensive heart disease with heart failure: Secondary | ICD-10-CM | POA: Diagnosis not present

## 2020-06-24 DIAGNOSIS — Z79899 Other long term (current) drug therapy: Secondary | ICD-10-CM | POA: Insufficient documentation

## 2020-06-24 DIAGNOSIS — R1084 Generalized abdominal pain: Secondary | ICD-10-CM | POA: Insufficient documentation

## 2020-06-24 DIAGNOSIS — R531 Weakness: Secondary | ICD-10-CM | POA: Insufficient documentation

## 2020-06-24 DIAGNOSIS — R000 Tachycardia, unspecified: Secondary | ICD-10-CM | POA: Insufficient documentation

## 2020-06-24 DIAGNOSIS — T8789 Other complications of amputation stump: Secondary | ICD-10-CM | POA: Insufficient documentation

## 2020-06-24 DIAGNOSIS — Z794 Long term (current) use of insulin: Secondary | ICD-10-CM | POA: Insufficient documentation

## 2020-06-24 DIAGNOSIS — L539 Erythematous condition, unspecified: Secondary | ICD-10-CM | POA: Insufficient documentation

## 2020-06-24 DIAGNOSIS — I5032 Chronic diastolic (congestive) heart failure: Secondary | ICD-10-CM | POA: Diagnosis not present

## 2020-06-24 DIAGNOSIS — Z91040 Latex allergy status: Secondary | ICD-10-CM | POA: Insufficient documentation

## 2020-06-24 DIAGNOSIS — A419 Sepsis, unspecified organism: Secondary | ICD-10-CM | POA: Diagnosis not present

## 2020-06-24 DIAGNOSIS — E109 Type 1 diabetes mellitus without complications: Secondary | ICD-10-CM | POA: Insufficient documentation

## 2020-06-24 DIAGNOSIS — Z20822 Contact with and (suspected) exposure to covid-19: Secondary | ICD-10-CM | POA: Diagnosis not present

## 2020-06-24 DIAGNOSIS — Y848 Other medical procedures as the cause of abnormal reaction of the patient, or of later complication, without mention of misadventure at the time of the procedure: Secondary | ICD-10-CM | POA: Insufficient documentation

## 2020-06-24 DIAGNOSIS — I2510 Atherosclerotic heart disease of native coronary artery without angina pectoris: Secondary | ICD-10-CM | POA: Insufficient documentation

## 2020-06-24 DIAGNOSIS — R112 Nausea with vomiting, unspecified: Secondary | ICD-10-CM | POA: Diagnosis present

## 2020-06-24 LAB — COMPREHENSIVE METABOLIC PANEL
ALT: 30 U/L (ref 0–44)
AST: 77 U/L — ABNORMAL HIGH (ref 15–41)
Albumin: 2.1 g/dL — ABNORMAL LOW (ref 3.5–5.0)
Alkaline Phosphatase: 96 U/L (ref 38–126)
Anion gap: 17 — ABNORMAL HIGH (ref 5–15)
BUN: 29 mg/dL — ABNORMAL HIGH (ref 8–23)
CO2: 16 mmol/L — ABNORMAL LOW (ref 22–32)
Calcium: 8.2 mg/dL — ABNORMAL LOW (ref 8.9–10.3)
Chloride: 102 mmol/L (ref 98–111)
Creatinine, Ser: 1.25 mg/dL — ABNORMAL HIGH (ref 0.44–1.00)
GFR, Estimated: 48 mL/min — ABNORMAL LOW (ref >60)
Glucose, Bld: 331 mg/dL — ABNORMAL HIGH (ref 70–99)
Potassium: 5.2 mmol/L — ABNORMAL HIGH (ref 3.5–5.1)
Sodium: 135 mmol/L (ref 135–145)
Total Bilirubin: 1 mg/dL (ref 0.3–1.2)
Total Protein: 6.1 g/dL — ABNORMAL LOW (ref 6.5–8.1)

## 2020-06-24 LAB — CBC WITH DIFFERENTIAL/PLATELET
Abs Immature Granulocytes: 0.3 10*3/uL — ABNORMAL HIGH (ref 0.00–0.07)
Basophils Absolute: 0.1 10*3/uL (ref 0.0–0.1)
Basophils Relative: 0 %
Eosinophils Absolute: 0 10*3/uL (ref 0.0–0.5)
Eosinophils Relative: 0 %
HCT: 26 % — ABNORMAL LOW (ref 36.0–46.0)
Hemoglobin: 7.9 g/dL — ABNORMAL LOW (ref 12.0–15.0)
Immature Granulocytes: 2 %
Lymphocytes Relative: 9 %
Lymphs Abs: 1.6 10*3/uL (ref 0.7–4.0)
MCH: 25.8 pg — ABNORMAL LOW (ref 26.0–34.0)
MCHC: 30.4 g/dL (ref 30.0–36.0)
MCV: 85 fL (ref 80.0–100.0)
Monocytes Absolute: 1.9 10*3/uL — ABNORMAL HIGH (ref 0.1–1.0)
Monocytes Relative: 10 %
Neutro Abs: 14.4 10*3/uL — ABNORMAL HIGH (ref 1.7–7.7)
Neutrophils Relative %: 79 %
Platelets: 496 10*3/uL — ABNORMAL HIGH (ref 150–400)
RBC: 3.06 MIL/uL — ABNORMAL LOW (ref 3.87–5.11)
RDW: 20.5 % — ABNORMAL HIGH (ref 11.5–15.5)
WBC: 18.2 10*3/uL — ABNORMAL HIGH (ref 4.0–10.5)
nRBC: 0.1 % (ref 0.0–0.2)

## 2020-06-24 LAB — RESP PANEL BY RT-PCR (FLU A&B, COVID) ARPGX2
Influenza A by PCR: NEGATIVE
Influenza B by PCR: NEGATIVE
SARS Coronavirus 2 by RT PCR: NEGATIVE

## 2020-06-24 LAB — I-STAT VENOUS BLOOD GAS, ED
Acid-base deficit: 7 mmol/L — ABNORMAL HIGH (ref 0.0–2.0)
Bicarbonate: 17.9 mmol/L — ABNORMAL LOW (ref 20.0–28.0)
Calcium, Ion: 1.12 mmol/L — ABNORMAL LOW (ref 1.15–1.40)
HCT: 22 % — ABNORMAL LOW (ref 36.0–46.0)
Hemoglobin: 7.5 g/dL — ABNORMAL LOW (ref 12.0–15.0)
O2 Saturation: 59 %
Patient temperature: 100
Potassium: 5.6 mmol/L — ABNORMAL HIGH (ref 3.5–5.1)
Sodium: 136 mmol/L (ref 135–145)
TCO2: 19 mmol/L — ABNORMAL LOW (ref 22–32)
pCO2, Ven: 34.1 mmHg — ABNORMAL LOW (ref 44.0–60.0)
pH, Ven: 7.331 (ref 7.250–7.430)
pO2, Ven: 34 mmHg (ref 32.0–45.0)

## 2020-06-24 LAB — PROTIME-INR
INR: 1.2 (ref 0.8–1.2)
Prothrombin Time: 14.9 seconds (ref 11.4–15.2)

## 2020-06-24 LAB — APTT: aPTT: 23 seconds — ABNORMAL LOW (ref 24–36)

## 2020-06-24 LAB — CBG MONITORING, ED: Glucose-Capillary: 245 mg/dL — ABNORMAL HIGH (ref 70–99)

## 2020-06-24 LAB — LACTIC ACID, PLASMA: Lactic Acid, Venous: 2.5 mmol/L (ref 0.5–1.9)

## 2020-06-24 MED ORDER — DEXTROSE 50 % IV SOLN
0.0000 mL | INTRAVENOUS | Status: DC | PRN
Start: 2020-06-24 — End: 2020-06-25

## 2020-06-24 MED ORDER — PANTOPRAZOLE SODIUM 40 MG IV SOLR
40.0000 mg | Freq: Once | INTRAVENOUS | Status: AC
  Administered 2020-06-24: 40 mg via INTRAVENOUS
  Filled 2020-06-24: qty 40

## 2020-06-24 MED ORDER — ONDANSETRON HCL 4 MG PO TABS: 4.0000 mg | ORAL_TABLET | Freq: Four times a day (QID) | ORAL | 0 refills | Status: AC | PRN

## 2020-06-24 MED ORDER — SODIUM CHLORIDE 0.9 % IV SOLN
2.0000 g | Freq: Once | INTRAVENOUS | Status: AC
  Administered 2020-06-24: 2 g via INTRAVENOUS
  Filled 2020-06-24: qty 2

## 2020-06-24 MED ORDER — METOCLOPRAMIDE HCL 5 MG/ML IJ SOLN
10.0000 mg | Freq: Once | INTRAMUSCULAR | Status: AC
  Administered 2020-06-24: 10 mg via INTRAVENOUS
  Filled 2020-06-24: qty 2

## 2020-06-24 MED ORDER — DEXTROSE IN LACTATED RINGERS 5 % IV SOLN: INTRAVENOUS | Status: DC

## 2020-06-24 MED ORDER — ONDANSETRON HCL 4 MG/2ML IJ SOLN
4.0000 mg | Freq: Once | INTRAMUSCULAR | Status: AC
  Administered 2020-06-24: 4 mg via INTRAVENOUS
  Filled 2020-06-24: qty 2

## 2020-06-24 MED ORDER — IOHEXOL 300 MG/ML  SOLN
100.0000 mL | Freq: Once | INTRAMUSCULAR | Status: DC | PRN
Start: 2020-06-24 — End: 2020-06-25

## 2020-06-24 MED ORDER — VANCOMYCIN HCL IN DEXTROSE 1-5 GM/200ML-% IV SOLN: 1000.0000 mg | INTRAVENOUS | Status: DC

## 2020-06-24 MED ORDER — VANCOMYCIN HCL IN DEXTROSE 1-5 GM/200ML-% IV SOLN
1000.0000 mg | Freq: Once | INTRAVENOUS | Status: AC
  Administered 2020-06-24: 1000 mg via INTRAVENOUS
  Filled 2020-06-24: qty 200

## 2020-06-24 MED ORDER — SODIUM CHLORIDE 0.9 % IV BOLUS
1000.0000 mL | Freq: Once | INTRAVENOUS | Status: AC
  Administered 2020-06-24: 1000 mL via INTRAVENOUS

## 2020-06-24 MED ORDER — SODIUM CHLORIDE 0.9 % IV SOLN: 2.0000 g | Freq: Two times a day (BID) | INTRAVENOUS | Status: DC

## 2020-06-24 MED ORDER — VANCOMYCIN HCL 750 MG/150ML IV SOLN: 750.0000 mg | Freq: Once | INTRAVENOUS | Status: DC

## 2020-06-24 MED ORDER — LACTATED RINGERS IV SOLN: INTRAVENOUS | Status: DC

## 2020-06-24 MED ORDER — LACTATED RINGERS IV BOLUS
1000.0000 mL | Freq: Once | INTRAVENOUS | Status: AC
  Administered 2020-06-24: 1000 mL via INTRAVENOUS

## 2020-06-24 MED ORDER — INSULIN REGULAR(HUMAN) IN NACL 100-0.9 UT/100ML-% IV SOLN
INTRAVENOUS | Status: DC
  Administered 2020-06-25: 8.5 [IU]/h via INTRAVENOUS
  Filled 2020-06-24: qty 100

## 2020-06-24 NOTE — ED Notes (Signed)
ED Provider at bedside. 

## 2020-06-24 NOTE — Progress Notes (Signed)
Pharmacy Antibiotic Note  Kaitlin Hudson is a 65 y.o. female admitted on 07/06/2020 with sepsis.  Pharmacy has been consulted for cefepime and vancomycin dosing.  Presenting after double AKA - having N/V, generalized weakness, and fever. Awaiting labs results.  Plan: Vancomycin 1g IV once Cefepime 2g IV once Monitor renal fx, cx results, clinical pic, and vanc levels as appropriate >>will enter scheduled doses once renal fx results  Weight: 80.4 kg (177 lb 4 oz)  Temp (24hrs), Avg:100 F (37.8 C), Min:100 F (37.8 C), Max:100 F (37.8 C)  Recent Labs  Lab 06/20/20 0531  WBC 11.8*  CREATININE 0.92    Estimated Creatinine Clearance: 34.1 mL/min (by C-G formula based on SCr of 0.92 mg/dL).    Allergies  Allergen Reactions  . Aspirin Other (See Comments)    Nose bleed  . Latex Rash  . Other Rash    VINYL   . Cimzia [Certolizumab Pegol] Other (See Comments)    bleeding  . Clindamycin/Lincomycin Diarrhea  . Diclofenac Nausea Only  . Enbrel [Etanercept] Hives  . Erythromycin Other (See Comments)    Severe stomach cramping  . Lasix [Furosemide] Itching  . Leflunomide Other (See Comments)    Fatigue/anxiety  . Rynatan [Chlorpheniramine-Phenyleph Er] Other (See Comments)    Stomach ache   . Statins Other (See Comments)    Myalgias.  . Tramadol Nausea And Vomiting  . Chlorhexidine Rash    CHG wipes  . Keflet [Cephalexin] Rash    Severe stomach cramping  . Penicillins Rash    Reaction: 3-4 years  . Pneumococcal Polysaccharide Vaccine Rash    All over body  . Sulfa Antibiotics Rash    Antimicrobials this admission: Vancomycin 4/12 >>  Cefepime 4/12 >>   Dose adjustments this admission: N/A  Microbiology results: 4/12 BCx: sent 4/12 UCx: sent   4/12 COVID PCR: sent  Thank you for allowing pharmacy to be a part of this patient's care.  Sherron Monday, PharmD, BCCCP Clinical Pharmacist  Phone: [phone] 06/14/2020 10:45 PM  Please check AMION for all [facility]  Pharmacy phone numbers After 10:00 PM, call Main Pharmacy [phone]

## 2020-06-24 NOTE — Telephone Encounter (Signed)
Patients husband called returning a TC cal inquiry. He reports that patient is not well and is asking for medical advice from Dr. Riley Kill. Last night patient became ill with nausea and vomiting. Patient is feeling horrible and weak. Can't eat, cannot keep medication down. Yesterdays B/P from [facility] was 100/60. This morning 115/65 with pulse of 132.  Blood sugar this morning was 500. 30 units of insulin given.  Last reading a few ours ago was down to 277. Patients husband would appreciate a call back from MD for medical advisement.

## 2020-06-24 NOTE — ED Notes (Signed)
This RN has attempted 2 IV starts without success. Consulting civil engineer at bedside attempting.

## 2020-06-24 NOTE — ED Notes (Signed)
Due to pt being hard stick, was only able to obtain one set of blood cultures. Abx started.

## 2020-06-24 NOTE — Telephone Encounter (Signed)
Transitional Care call completed  Discharged from [facility]: 06/20/2020 Spoke to patients [facility] Rayna [facility]    1. Are you/is patient experiencing any problems since coming home? All was well until last night went patient became ill throwing up, weakness, unable to eat and hold down medicine  I informed patients husband that I would inform Dr. Riley Kill ASAP. Which I did, Dr. Riley Kill contacted the patient and prescribed zofran 4mg  QID as needed #30.  Dr. advised observation for a couple of days to determine efficacy. Advised urgent care or ED if conditions do not improve Are there any questions regarding any aspect of care? No  2. Are there any questions regarding medications administration/dosing? No  Are meds being taken as prescribed? Yes, until nausea/vomiting Patient should review meds with caller to confirm   3. Have there been any falls? No  4. Has Home Health been to the house and/or have they contacted you? Yes RN and PT If not, have you tried to contact them? Can we help you contact them?   5. Are bowels and bladder emptying properly? Yes within reason until nausea and vomiting Are there any unexpected incontinence issues? No If applicable, is patient following bowel/bladder programs?   6. Any fevers, problems with breathing, unexpected pain?  No  7. Are there any skin problems or new areas of breakdown? No  8. Has the patient/family member arranged specialty MD follow up (ie cardiology/neurology/renal/surgical/etc) Yes  Can we help arrange? No  9. Does the patient need any other services or support that we can help arrange? No  10. Are caregivers following through as expected in assisting the patient? Yes  11. Has the patient quit smoking, drinking alcohol, or using drugs as recommended? Patient not doing these things

## 2020-06-24 NOTE — Telephone Encounter (Signed)
Transition Care Management Unsuccessful Follow-up Telephone Call  Date of discharge and from where:  Discharged on 06/20/2020 from [facility]. Attempts:  1st Attempt  Reason for unsuccessful TCM follow-up call:  Left voice message on patient phone number.    Transition Care Management Unsuccessful Follow-up Telephone Cal Date of discharge and from where:  [facility] on 06/20/2020  Attempts:  2nd Attempt  Reason for unsuccessful TCM follow-up call:  Left voice message on Mr. Wojtas contact phone number.

## 2020-06-24 NOTE — ED Triage Notes (Signed)
Pt was recently discharged from [facility] on 4/8 after a double above knee amputations. Started having N/V this AM with fever. Unable to keep anything down. States chest discomfort and generalized weakness

## 2020-06-24 NOTE — ED Provider Notes (Signed)
[facility] EMERGENCY DEPARTMENT Provider Note   CSN: [id number] Arrival date & time: 07/08/2020  2053     History Chief Complaint  Patient presents with  . Vomiting  . Weakness    Kaitlin Hudson is a 65 y.o. female.  The history is provided by the patient.  Illness Severity:  Mild Onset quality:  Gradual Timing:  Constant Progression:  Worsening Chronicity:  New Context:  Patient with history of diabetes who presents the ED with nausea and vomiting, abdominal pain.  Recent bilateral BKA.  On doxycycline.  Fever today.  Denies any diarrhea.  Has had nausea and vomiting.  Unable to keep anything down.  Has not noticed any active drainage or worsening redness to bilateral surgical sites.  Denies any cough. Relieved by:  Nothing Worsened by:  Nothing Associated symptoms: abdominal pain, fever, nausea and vomiting   Associated symptoms: no chest pain, no cough, no ear pain, no rash, no shortness of breath and no sore throat        Past Medical History:  Diagnosis Date  . Anxiety   . Arthritis, rheumatoid (HCC)   . Coronary artery disease   . Depression   . Diabetes mellitus without complication (HCC)   . DVT (deep venous thrombosis) (HCC)    Left leg 10/2019  . Headache   . History of blood transfusion   . Hyperlipidemia   . Hypertension   . Latent tuberculosis    pt denies this dx  . Peripheral vascular disease (HCC)    blood clot in left leg  . Type 1 diabetes [facility])     Patient Active Problem List   Diagnosis Date Noted  . Generalized anxiety disorder   . S/P AKA (above knee amputation) bilateral (HCC) 06/08/2020  . Leukocytosis   . Acute blood loss anemia   . Wound dehiscence 06/04/2020  . Dehiscence of amputation stump (HCC)   . Steroid-induced hyperglycemia   . Controlled type 1 diabetes mellitus with hyperglycemia (HCC)   . Essential hypertension   . Sinus tachycardia   . Postoperative pain   . S/P bilateral below knee amputation (HCC)  12/23/2019  . Below-knee amputation (HCC) 12/19/2019  . Gangrene of both feet (HCC)   . Heel ulcer, right, with necrosis of muscle (HCC)   . Chronic diastolic heart failure (HCC) 09/12/2013  . Rheumatoid arthritis (HCC) 09/12/2013  . IDDM (insulin dependent diabetes mellitus) 09/09/2013  . Cellulitis of right lower extremity 09/08/2013    Past Surgical History:  Procedure Laterality Date  . AMPUTATION Bilateral 12/19/2019   Procedure: BILATERAL BELOW KNEE AMPUTATION;  Surgeon: Nadara Mustard, MD;  Location: [facility] OR;  Service: Orthopedics;  Laterality: Bilateral;  . AMPUTATION Bilateral 06/04/2020   Procedure: BILATERAL ABOVE KNEE AMPUTATION;  Surgeon: Nadara Mustard, MD;  Location: [facility] OR;  Service: Orthopedics;  Laterality: Bilateral;  . APPLICATION OF WOUND VAC Bilateral 06/04/2020   Procedure: APPLICATION OF WOUND VAC;  Surgeon: Nadara Mustard, MD;  Location: [facility] OR;  Service: Orthopedics;  Laterality: Bilateral;  . CARDIAC CATHETERIZATION  11/02/2019  . CARPAL TUNNEL RELEASE Bilateral   . EYE SURGERY Bilateral    09/2018  . FINGER SURGERY  07/11/2014   right middle finger - nodule removed  . FINGER SURGERY  02/20/2017   right middle finger  . FOOT SURGERY Right 02/26/2014   screw  . I & D EXTREMITY Right 08/15/2019   Procedure: RIGHT ACHILLES DEBRIDEMENT, POSSIBLE SKIN GRAFT;  Surgeon: Nadara Mustard, MD;  Location: [facility] OR;  Service: Orthopedics;  Laterality: Right;  . JOINT REPLACEMENT    . SHOULDER SURGERY Right 01/06/2005  . TRIGGER FINGER RELEASE  12/202002   left hand -   . WRIST SURGERY Left 10/23/2004   and middle finger  . WRIST SURGERY Right 11/05/2005  . WRIST SURGERY Right 03/11/2006   cyst removed     OB History   No obstetric history on file.     No family history on file.  Social History   Tobacco Use  . Smoking status: Never Smoker  . Smokeless tobacco: Never Used  Vaping Use  . Vaping Use: Never used  Substance Use Topics  . Alcohol use: No  . Drug  use: No    Home Medications Prior to Admission medications   Medication Sig Start Date End Date Taking? Authorizing Provider  acetaminophen (TYLENOL) 325 MG tablet Take 1-2 tablets (325-650 mg total) by mouth every 4 (four) hours as needed for mild pain. 06/19/20   Angiulli, Mcarthur Rossetti, PA-C  aspirin EC 81 MG tablet Take 81 mg by mouth daily. Swallow whole.  Has not started.    [provider]  bumetanide (BUMEX) 2 MG tablet Take 1 tablet (2 mg total) by mouth daily. 06/19/20   Angiulli, Mcarthur Rossetti, PA-C  cholecalciferol (VITAMIN D) 25 MCG (1000 UNIT) tablet Take 1 tablet (1,000 Units total) by mouth daily. 06/19/20   Angiulli, Mcarthur Rossetti, PA-C  clopidogrel (PLAVIX) 75 MG tablet Take 1 tablet (75 mg total) by mouth daily. 06/19/20   Angiulli, Mcarthur Rossetti, PA-C  docusate sodium (COLACE) 100 MG capsule Take 1 capsule (100 mg total) by mouth 2 (two) times daily. Patient not taking: Reported on 05/29/2020 08/20/19   Persons, [place], [place]  doxycycline (VIBRA-TABS) 100 MG tablet Take 1 tablet (100 mg total) by mouth every 12 (twelve) hours. 06/20/20   Persons, [place], PA  glucose blood (ONETOUCH ULTRA) test strip Use 6 times daily. E11.65 on insulin pump 06/06/19   [provider]  HUMALOG 100 UNIT/ML injection continuous. Via Insulin Pump 07/31/19   [provider]  HUMULIN N 100 UNIT/ML injection Inject 18 Units into the skin at bedtime. 03/21/20   [provider]  insulin aspart (NOVOLOG) 100 UNIT/ML injection Inject 0-15 Units into the skin 3 (three) times daily with meals. 06/08/20   Nadara Mustard, MD  insulin aspart (NOVOLOG) 100 UNIT/ML injection Inject 4 Units into the skin 3 (three) times daily with meals. 06/08/20   Nadara Mustard, MD  insulin NPH Human (HUMULIN N) 100 UNIT/ML injection Inject 0.18 mLs (18 Units total) into the skin at bedtime. 06/08/20   Nadara Mustard, MD  Insulin Syringe-Needle U-100 (INSULIN SYRINGE .3CC/31GX5/16") 31G X 5/16" 0.3 ML MISC Use 1 time daily  05/14/19   [provider]  methocarbamol (ROBAXIN) 500 MG tablet Take 1 tablet (500 mg total) by mouth every 8 (eight) hours as needed for muscle spasms. 06/19/20   Angiulli, Mcarthur Rossetti, PA-C  metoprolol tartrate (LOPRESSOR) 25 MG tablet Take 0.5 tablets (12.5 mg total) by mouth 2 (two) times daily. 06/19/20   Angiulli, Mcarthur Rossetti, PA-C  ondansetron (ZOFRAN) 4 MG tablet Take 1 tablet (4 mg total) by mouth every 6 (six) hours as needed for nausea or vomiting. 06/23/2020   Ranelle Oyster, MD  oxyCODONE (OXYCONTIN) 20 mg 12 hr tablet Take 1 tablet (20 mg total) by mouth every 12 (twelve) hours. 06/19/20   Angiulli, Mcarthur Rossetti, PA-C  oxyCODONE (ROXICODONE) 15 MG immediate release tablet Take 1 tablet (15mg ) every 6 hours as needed for pain 06/19/20   Angiulli, 08/19/20, PA-C  polyethylene glycol (MIRALAX / GLYCOLAX) 17 g packet Take 17 g by mouth 2 (two) times daily. 06/19/20   Angiulli, 08/19/20, PA-C  predniSONE (DELTASONE) 2.5 MG tablet Take 5 tablets (12.5 mg total) by mouth daily with breakfast. 06/19/20   Angiulli, 08/19/20, PA-C  predniSONE (DELTASONE) 2.5 MG tablet Take 1 tablet (2.5 mg total) by mouth daily with supper. 06/19/20   Angiulli, 08/19/20, PA-C    Allergies    Aspirin, Latex, Other, Cimzia [certolizumab pegol], Clindamycin/lincomycin, Diclofenac, Enbrel [etanercept], Erythromycin, Lasix [furosemide], Leflunomide, Rynatan [chlorpheniramine-phenyleph er], Statins, Tramadol, Chlorhexidine, Keflet [cephalexin], Penicillins, Pneumococcal polysaccharide vaccine, and Sulfa antibiotics  Review of Systems   Review of Systems  Constitutional: Positive for fever. Negative for chills.  HENT: Negative for ear pain and sore throat.   Eyes: Negative for pain and visual disturbance.  Respiratory: Negative for cough and shortness of breath.   Cardiovascular: Negative for chest pain and palpitations.  Gastrointestinal: Positive for abdominal pain, nausea and vomiting.  Genitourinary: Negative for dysuria and  hematuria.  Musculoskeletal: Negative for arthralgias and back pain.  Skin: Positive for wound. Negative for color change and rash.  Neurological: Negative for seizures and syncope.  All other systems reviewed and are negative.   Physical Exam Updated Vital Signs BP 108/82   Pulse (!) 134   Temp 100 F (37.8 C) (Oral)   Resp (!) 31   Wt 80.4 kg   SpO2 96%   BMI 67.67 kg/m   Physical Exam Vitals and nursing note reviewed.  Constitutional:      General: She is in acute distress.     Appearance: She is well-developed. She is ill-appearing.  HENT:     Head: Normocephalic and atraumatic.     Nose: Nose normal.     Mouth/Throat:     Mouth: Mucous membranes are moist.  Eyes:     Extraocular Movements: Extraocular movements intact.     Conjunctiva/sclera: Conjunctivae normal.     Pupils: Pupils are equal, round, and reactive to light.  Cardiovascular:     Rate and Rhythm: Regular rhythm. Tachycardia present.     Pulses: Normal pulses.     Heart sounds: Normal heart sounds. No murmur heard.   Pulmonary:     Effort: Pulmonary effort is normal. No respiratory distress.     Breath sounds: Normal breath sounds.  Abdominal:     Palpations: Abdomen is soft.     Tenderness: There is abdominal tenderness (diffuse). There is guarding.  Musculoskeletal:     Cervical back: Normal range of motion and neck supple.  Skin:    General: Skin is warm and dry.     Capillary Refill: Capillary refill takes less than 2 seconds.     Comments: Bilateral BKA sites with some mild erythema around surgical sites but no obvious purulent drainage or tenderness or significant erythema or crepitus  Neurological:     General: No focal deficit present.     Mental Status: She is alert.     ED Results / Procedures / Treatments   Labs (all labs ordered are listed, but only abnormal results are displayed) Labs Reviewed  LACTIC ACID, PLASMA - Abnormal; Notable for the following components:      Result  Value   Lactic Acid, Venous 2.5 (*)    All other components within normal limits  COMPREHENSIVE METABOLIC PANEL - Abnormal; Notable for the following components:   Potassium 5.2 (*)    CO2 16 (*)    Glucose, Bld 331 (*)    BUN 29 (*)    Creatinine, Ser 1.25 (*)    Calcium 8.2 (*)    Total Protein 6.1 (*)    Albumin 2.1 (*)    AST 77 (*)    GFR, Estimated 48 (*)    Anion gap 17 (*)    All other components within normal limits  CBC WITH DIFFERENTIAL/PLATELET - Abnormal; Notable for the following components:   WBC 18.2 (*)    RBC 3.06 (*)    Hemoglobin 7.9 (*)    HCT 26.0 (*)    MCH 25.8 (*)    RDW 20.5 (*)    Platelets 496 (*)    Neutro Abs 14.4 (*)    Monocytes Absolute 1.9 (*)    Abs Immature Granulocytes 0.30 (*)    All other components within normal limits  APTT - Abnormal; Notable for the following components:   aPTT 23 (*)    All other components within normal limits  CBG MONITORING, ED - Abnormal; Notable for the following components:   Glucose-Capillary 245 (*)    All other components within normal limits  RESP PANEL BY RT-PCR (FLU A&B, COVID) ARPGX2  CULTURE, BLOOD (ROUTINE X 2)  CULTURE, BLOOD (ROUTINE X 2)  URINE CULTURE  PROTIME-INR  LACTIC ACID, PLASMA  URINALYSIS, ROUTINE W REFLEX MICROSCOPIC  BLOOD GAS, VENOUS  BETA-HYDROXYBUTYRIC ACID  BETA-HYDROXYBUTYRIC ACID    EKG EKG Interpretation  Date/Time:  Tuesday June 24 2020 21:40:13 EDT Ventricular Rate:  126 PR Interval:  139 QRS Duration: 86 QT Interval:  295 QTC Calculation: 427 R Axis:   32 Text Interpretation: Sinus tachycardia Low voltage, extremity and precordial leads Consider anterior infarct Confirmed by Lockie Mola, Myalee Stengel ([id number]) on 07/09/2020 10:27:04 PM   Radiology No results found.  Procedures .Critical Care Performed by: Virgina Norfolk, DO Authorized by: Virgina Norfolk, DO   Critical care provider statement:    Critical care time (minutes):  35   Critical care was necessary to  treat or prevent imminent or life-threatening deterioration of the following conditions:  Sepsis   Critical care was time spent personally by me on the following activities:  Blood draw for specimens, development of treatment plan with patient or surrogate, examination of patient, obtaining history from patient or surrogate, ordering and performing treatments and interventions, ordering and review of laboratory studies, ordering and review of radiographic studies, re-evaluation of patient's condition and review of old charts   I assumed direction of critical care for this patient from another provider in my specialty: no       Medications Ordered in ED Medications  ceFEPIme (MAXIPIME) 2 g in sodium chloride 0.9 % 100 mL IVPB (2 g Intravenous New Bag/Given 06/15/2020 2304)  vancomycin (VANCOCIN) IVPB 1000 mg/200 mL premix (has no administration in time range)  iohexol (OMNIPAQUE) 300 MG/ML solution 100 mL (has no administration in time range)  pantoprazole (PROTONIX) injection 40 mg (has no administration in time range)  metoCLOPramide (REGLAN) injection 10 mg (has no administration in time range)  sodium chloride 0.9 % bolus 1,000 mL (has no administration in time range)  insulin regular, human (MYXREDLIN) 100 units/ 100 mL infusion (has no administration in time range)  lactated ringers infusion (has no administration in time range)  dextrose 5 % in lactated ringers infusion (has no administration in time range)  dextrose 50 % solution 0-50 mL (has no administration in time range)  lactated ringers bolus 1,000 mL (1,000 mLs Intravenous New Bag/Given 06/27/2020 2257)  ondansetron (ZOFRAN) injection 4 mg (4 mg Intravenous Given 06/22/2020 2302)    ED Course  I have reviewed the triage vital signs and the nursing notes.  Pertinent labs & imaging results that were available during my care of the patient were reviewed by me and considered in my medical decision making (see chart for details).    MDM  Rules/Calculators/A&P                          Kaitlin Hudson is a 65 year old female with history of hypertension, high cholesterol, CAD, diabetes, recent bilateral BKA who presents the ED with fever, nausea, vomiting, abdominal pain.  Patient febrile and tachycardic upon arrival.  Appears uncomfortable.  Diffuse abdominal tenderness on exam.  Surgical sites of bilateral BKA's overall with some mild erythema but no obvious crepitus or abscess or purulent drainage.  She is currently on doxycycline for prophylactic treatment for these wounds.  Wound care team saw her yesterday and overall thought things were going well.  Not sure that this is a source of infection.  She could have DKA could have a UTI could have an intra-abdominal process such as colitis or bowel obstruction or other intra-abdominal infection.  Will pursue sepsis work-up.  Will give IV vancomycin IV cefepime.  Will evaluate for DKA.  Will give fluid bolus.  EKG shows sinus tachycardia.  Heart rate up in the 120s to 130s.  White count is 18.2, lactic acid 2.5.  Bicarb is 16, blood sugar 331.  Anion gap is 17.  Suspect likely a mild DKA and will start insulin infusion.  Will give 2 L lactated ringer bolus.  Patient signed out to oncoming ED staff with patient pending chest x-ray, CT abdomen and pelvis, blood gas, urinalysis.  Please see his note for further results, evaluation, disposition of the patient.  Overall suspect sepsis.  Possibly DKA.  This chart was dictated using voice recognition software.  Despite best efforts to proofread,  errors can occur which can change the documentation meaning.    Final Clinical Impression(s) / ED Diagnoses Final diagnoses:  Sepsis, due to unspecified organism, unspecified whether acute organ dysfunction present [facility])    Rx / DC Orders ED Discharge Orders    None       Virgina Norfolk, DO 06/19/2020 2327

## 2020-06-25 ENCOUNTER — Telehealth: Payer: Self-pay | Admitting: Orthopedic Surgery

## 2020-06-25 DIAGNOSIS — A419 Sepsis, unspecified organism: Secondary | ICD-10-CM | POA: Diagnosis not present

## 2020-06-25 LAB — CBG MONITORING, ED: Glucose-Capillary: 310 mg/dL — ABNORMAL HIGH (ref 70–99)

## 2020-06-25 MED ORDER — LORAZEPAM 2 MG/ML IJ SOLN
1.0000 mg | Freq: Once | INTRAMUSCULAR | Status: DC
  Filled 2020-06-25: qty 1

## 2020-06-26 ENCOUNTER — Encounter: Payer: Medicare Other | Admitting: Orthopedic Surgery

## 2020-06-30 LAB — CULTURE, BLOOD (ROUTINE X 2)
Culture: NO GROWTH
Special Requests: ADEQUATE

## 2020-07-02 ENCOUNTER — Encounter: Payer: Medicare Other | Admitting: Registered Nurse

## 2020-07-13 NOTE — Telephone Encounter (Signed)
I had spoken to pt/husband yesterday afternoon about her symptoms. We called in zofran for nausea, but I has advised her to go ED or urgent care if symptoms did not improve over the next hour or two. Ultimately she went to ED last night where she became unresponsive. As she was DNR, no attempts were made at resuscitation. I spoke with husband again this morning who appreciated the care she received

## 2020-07-13 NOTE — ED Notes (Signed)
 Donor Services contacted.  Does not meet criteria for donation.  Referral number [id number]  Spoke with Marica Otter.

## 2020-07-13 NOTE — ED Notes (Signed)
ED Provider at bedside. 

## 2020-07-13 NOTE — ED Notes (Signed)
Pt became increasingly distressed and suddenly slumped over to the side. Doctor called to room to assess pt.

## 2020-07-13 NOTE — ED Provider Notes (Signed)
I assumed care of this patient.  Please see previous provider note for further details of Hx, PE.  Briefly patient is a 65 y.o. female who presented who presented for generalized fatigue and emesis found to be septic from presumed intra-abdominal process.  Both BKA surgical sites appear to be clean dry and intact and not infected.  Patient is currently receiving her antibiotics.  Labs notable for leukocytosis and elevated lactic acid.  Renal insufficiency noted.  Hyperglycemia with possible DKA.  Patient already on IV fluids.  Started on insulin drip.  Currently awaiting UA, chest x-ray and CT of the abdomen.  Plan to follow-up those results and admit patient.  Patient has received IV fluids. Chest x-ray without evidence of pneumonia. Still awaiting CT scan but patient is uncomfortable and tachycardic.  0037 AM I was called by nursing staff to see the patient, regarding unresponsiveness. The patient was found to have agonal breathing and heart rate trending down.  Husband was at bedside and expressed patient's wishes to be DNR/DNI which was confirmed in the chart.  The patient was pronounced dead at 0041 AM on 2020-07-01. Family at bedside notified.    Marland Kitchen1-3 Lead EKG Interpretation Performed by: Nira Conn, MD Authorized by: Nira Conn, MD     Interpretation: abnormal     ECG rate:  148   ECG rate assessment: tachycardic     Rhythm: sinus tachycardia     Ectopy: none     Conduction: normal   .Critical Care Performed by: Nira Conn, MD Authorized by: Nira Conn, MD    CRITICAL CARE Performed by: Amadeo Garnet Buster Schueller Total critical care time: 30 minutes Critical care time was exclusive of separately billable procedures and treating other patients. Critical care was necessary to treat or prevent imminent or life-threatening deterioration. Critical care was time spent personally by me on the following activities: development of treatment plan  with patient and/or surrogate as well as nursing, discussions with consultants, evaluation of patient's response to treatment, examination of patient, obtaining history from patient or surrogate, ordering and performing treatments and interventions, ordering and review of laboratory studies, ordering and review of radiographic studies, pulse oximetry and re-evaluation of patient's condition.       Nira Conn, MD July 01, 2020 [phone]

## 2020-07-13 DEATH — deceased

## 2022-11-09 IMAGING — DX DG CHEST 1V PORT
1 series · 1 of 1 positions shown · non-contrast
Comparison: November 15, 2019

CLINICAL DATA: Fever with chest discomfort, nausea and vomiting.

EXAM:
PORTABLE CHEST 1 VIEW

[chest ap]
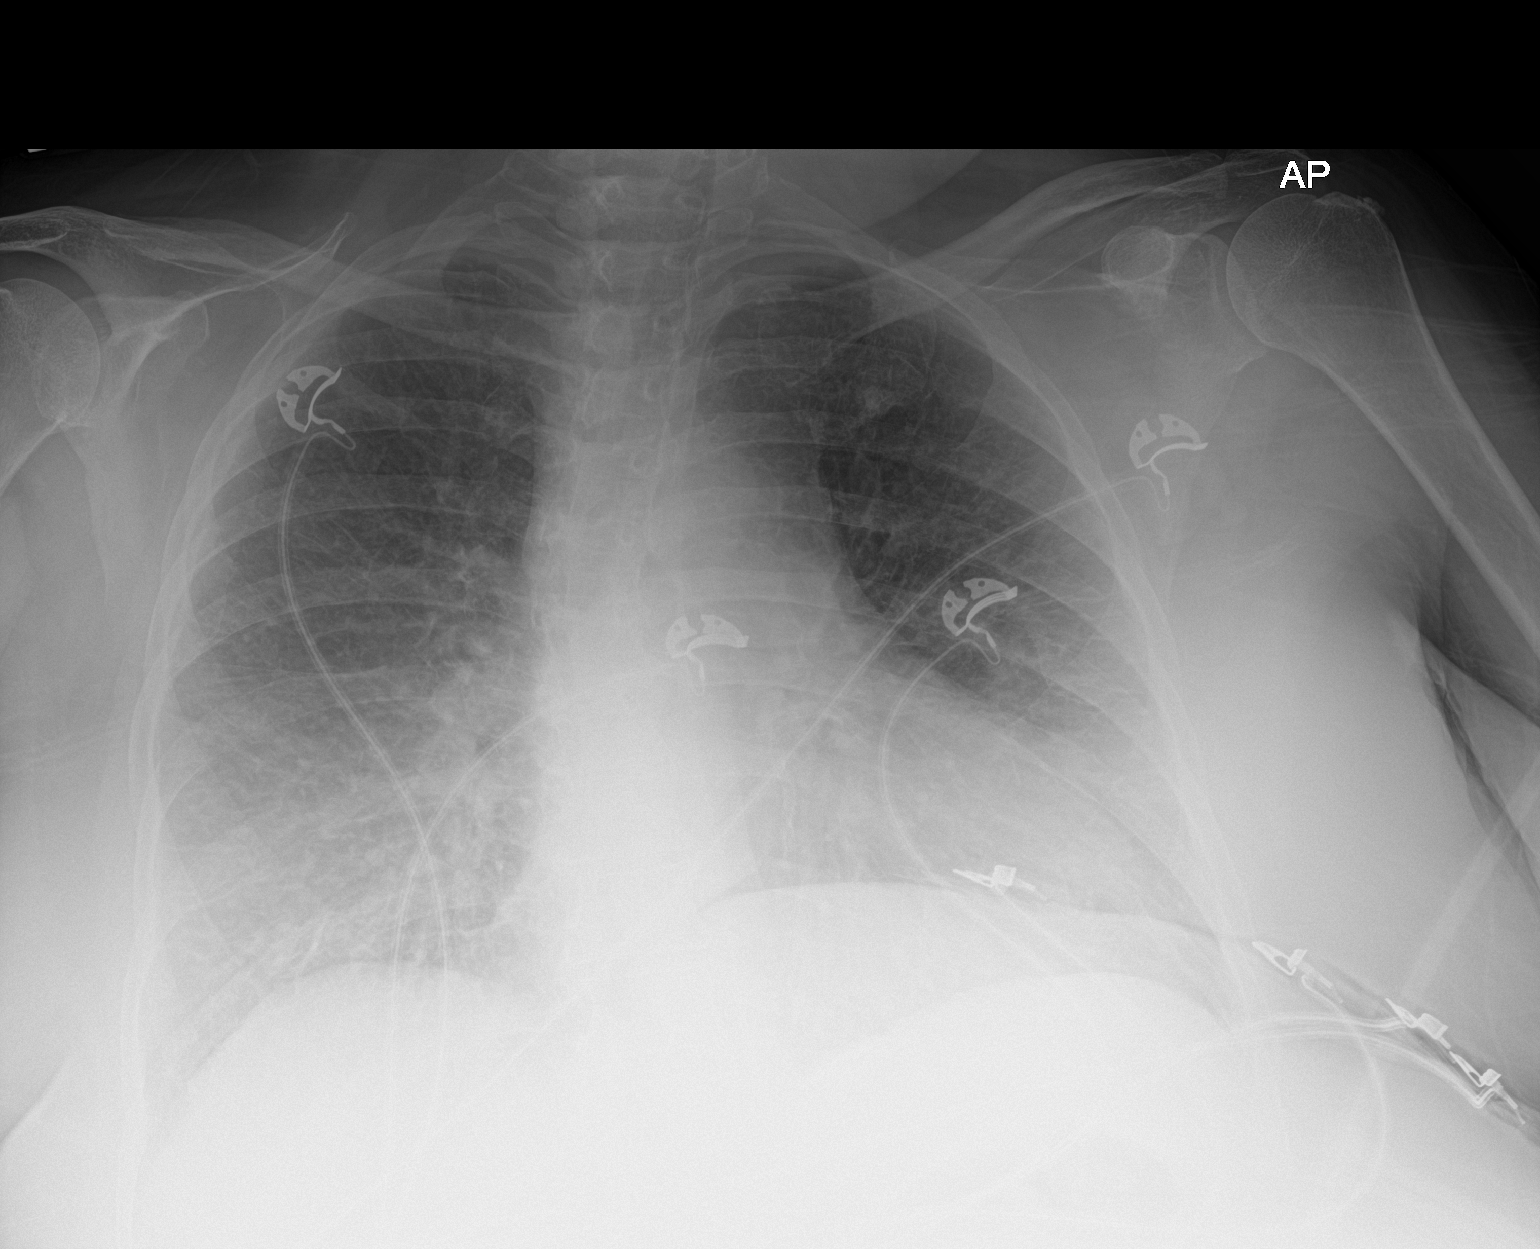

[1 of 1 positions shown; findings below may reference images not displayed]

FINDINGS: Mildly increased lung markings are seen without evidence of focal
consolidation, pleural effusion or pneumothorax. The heart size and
mediastinal contours are within normal limits. The visualized
skeletal structures are unremarkable.
IMPRESSION: Mildly increased lung markings without evidence of focal infiltrate.
